# Patient Record
Sex: Female | Born: 1935 | Race: White | Hispanic: No | Smoking: Former smoker
Health system: Southern US, Community
[De-identification: ages and names within clinical notes are randomized; demographics above are authoritative.]

## PROBLEM LIST (undated history)

## (undated) DIAGNOSIS — D649 Anemia, unspecified: Secondary | ICD-10-CM

## (undated) DIAGNOSIS — I6529 Occlusion and stenosis of unspecified carotid artery: Secondary | ICD-10-CM

## (undated) DIAGNOSIS — I10 Essential (primary) hypertension: Secondary | ICD-10-CM

## (undated) DIAGNOSIS — S72009A Fracture of unspecified part of neck of unspecified femur, initial encounter for closed fracture: Secondary | ICD-10-CM

## (undated) DIAGNOSIS — E119 Type 2 diabetes mellitus without complications: Secondary | ICD-10-CM

## (undated) DIAGNOSIS — Z9289 Personal history of other medical treatment: Secondary | ICD-10-CM

## (undated) DIAGNOSIS — K219 Gastro-esophageal reflux disease without esophagitis: Secondary | ICD-10-CM

## (undated) DIAGNOSIS — E785 Hyperlipidemia, unspecified: Secondary | ICD-10-CM

## (undated) DIAGNOSIS — I739 Peripheral vascular disease, unspecified: Secondary | ICD-10-CM

## (undated) DIAGNOSIS — J449 Chronic obstructive pulmonary disease, unspecified: Secondary | ICD-10-CM

## (undated) DIAGNOSIS — I509 Heart failure, unspecified: Secondary | ICD-10-CM

## (undated) DIAGNOSIS — M1990 Unspecified osteoarthritis, unspecified site: Secondary | ICD-10-CM

## (undated) HISTORY — DX: Essential (primary) hypertension: I10

## (undated) HISTORY — PX: COLONOSCOPY: SHX174

## (undated) HISTORY — DX: Occlusion and stenosis of unspecified carotid artery: I65.29

## (undated) HISTORY — DX: Heart failure, unspecified: I50.9

## (undated) HISTORY — DX: Hyperlipidemia, unspecified: E78.5

## (undated) HISTORY — DX: Peripheral vascular disease, unspecified: I73.9

## (undated) HISTORY — DX: Anemia, unspecified: D64.9

## (undated) HISTORY — DX: Gastro-esophageal reflux disease without esophagitis: K21.9

## (undated) HISTORY — DX: Chronic obstructive pulmonary disease, unspecified: J44.9

## (undated) HISTORY — DX: Fracture of unspecified part of neck of unspecified femur, initial encounter for closed fracture: S72.009A

---
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
## 1987-07-29 HISTORY — PX: POSTERIOR LAMINECTOMY / DECOMPRESSION LUMBAR SPINE: SUR740

## 2000-07-28 HISTORY — PX: CATARACT EXTRACTION W/ INTRAOCULAR LENS  IMPLANT, BILATERAL: SHX1307

## 2001-07-28 HISTORY — PX: PERIPHERAL ARTERIAL STENT GRAFT: SHX2220

## 2002-08-03 ENCOUNTER — Encounter: Payer: Self-pay | Admitting: Cardiology

## 2002-08-03 ENCOUNTER — Ambulatory Visit (HOSPITAL_COMMUNITY): Admission: RE | Admit: 2002-08-03 | Discharge: 2002-08-04 | Payer: Self-pay | Admitting: Cardiology

## 2007-07-29 HISTORY — PX: REFRACTIVE SURGERY: SHX103

## 2007-07-29 HISTORY — PX: EYE SURGERY: SHX253

## 2009-07-04 ENCOUNTER — Ambulatory Visit: Payer: Self-pay | Admitting: Vascular Surgery

## 2010-02-19 ENCOUNTER — Ambulatory Visit: Payer: Self-pay | Admitting: Vascular Surgery

## 2010-08-21 ENCOUNTER — Ambulatory Visit: Admit: 2010-08-21 | Payer: Self-pay | Admitting: Vascular Surgery

## 2010-09-18 ENCOUNTER — Other Ambulatory Visit (INDEPENDENT_AMBULATORY_CARE_PROVIDER_SITE_OTHER): Payer: Medicare Other

## 2010-09-18 ENCOUNTER — Encounter (INDEPENDENT_AMBULATORY_CARE_PROVIDER_SITE_OTHER): Payer: Medicare Other

## 2010-09-18 DIAGNOSIS — Z48812 Encounter for surgical aftercare following surgery on the circulatory system: Secondary | ICD-10-CM

## 2010-09-18 DIAGNOSIS — I6529 Occlusion and stenosis of unspecified carotid artery: Secondary | ICD-10-CM

## 2010-09-18 DIAGNOSIS — I739 Peripheral vascular disease, unspecified: Secondary | ICD-10-CM

## 2010-09-20 NOTE — Procedures (Unsigned)
CAROTID DUPLEX EXAM  INDICATION:  Carotid stenosis.  HISTORY: Diabetes:  Yes. Cardiac:  No. Hypertension:  Yes. Smoking:  Previous. Previous Surgery:  No. CV History: Amaurosis Fugax No, Paresthesias No, Hemiparesis No.                                      RIGHT             LEFT Brachial systolic pressure:         187               195 Brachial Doppler waveforms:         WNL               WNL Vertebral direction of flow:        Antegrade         Antegrade DUPLEX VELOCITIES (cm/sec) CCA peak systolic                   99                77 ECA peak systolic                   134               123 ICA peak systolic                   262               62 ICA end diastolic                   60                14 PLAQUE MORPHOLOGY:                  Calcified         Calcified PLAQUE AMOUNT:                      Moderate          Minimal PLAQUE LOCATION:                    CCA, ICA, ECA     CCA, ICA  IMPRESSION: 1. Right internal carotid artery stenosis of 40% to 59% (high end of     range). 2. Left internal carotid artery shows no evidence of hemodynamically     significant stenosis. 3. No significant change of velocity since the previous study. 4. Vessel tortuosity is noted in the mid to distal segments of the     bilateral internal carotid arteries.  ___________________________________________ Janetta Hora Darrick Penna, MD  SH/MEDQ  D:  09/18/2010  T:  09/18/2010  Job:  [id number]

## 2010-12-10 NOTE — Procedures (Signed)
CAROTID DUPLEX EXAM   INDICATION:  Right carotid bruit.   HISTORY:  Diabetes:  Yes.  Cardiac:  No.  Hypertension:  Yes.  Smoking:  Quit.  Previous Surgery:  No.  CV History:  No.  Amaurosis Fugax No, Paresthesias No, Hemiparesis No                                       RIGHT             LEFT  Brachial systolic pressure:         146  Brachial Doppler waveforms:  Vertebral direction of flow:        Antegrade         Antegrade  DUPLEX VELOCITIES (cm/sec)  CCA peak systolic                   111               83  ECA peak systolic                   181               88  ICA peak systolic                   237               80  ICA end diastolic                   62                25  PLAQUE MORPHOLOGY:                  Calcified         Calcified  PLAQUE AMOUNT:                      Moderate to severe                  Mild  PLAQUE LOCATION:                    ICA and ECA       ICA   IMPRESSION:  1. 60-79% stenosis noted in the right internal carotid artery.  2. 20-39% stenosis noted in the left internal carotid artery.  3. Antegrade bilateral vertebral arteries.   ___________________________________________  Janetta Hora Fields, MD   MG/MEDQ  D:  07/04/2009  T:  07/04/2009  Job:  [id number]

## 2010-12-10 NOTE — Procedures (Signed)
CAROTID DUPLEX EXAM   INDICATION:  Follow up known carotid disease.   HISTORY:  Diabetes:  Yes.  Cardiac:  No.  Hypertension:  Yes.  Smoking:  Previous.  Previous Surgery:  No.  CV History:  Asymptomatic.  Amaurosis Fugax No, Paresthesias No, Hemiparesis No.                                       RIGHT             LEFT  Brachial systolic pressure:         192               188  Brachial Doppler waveforms:         Normal            Normal  Vertebral direction of flow:        Antegrade         Antegrade  DUPLEX VELOCITIES (cm/sec)  CCA peak systolic                   107               86  ECA peak systolic                   144               120  ICA peak systolic                   240               72  ICA end diastolic                   64                20  PLAQUE MORPHOLOGY:                  Calcific          Calcific  PLAQUE AMOUNT:                      Moderate-to-severe                  Mild  PLAQUE LOCATION:                    ICA, ECA          ICA   IMPRESSION:  1. Doppler velocities suggest 60% to 79% stenosis in the right      internal carotid artery.  2. Doppler velocities suggest 1% to 39% stenosis in the left internal      carotid artery.  3. Antegrade flow noted in the bilateral vertebral arteries.  4. No significant changes from previous examinations.   ___________________________________________  Janetta Hora Fields, MD   NT/MEDQ  D:  02/19/2010  T:  02/19/2010  Job:  [id number]

## 2010-12-10 NOTE — Assessment & Plan Note (Signed)
OFFICE VISIT   Cynthia Ray  DOB:  [birth year]                                       07/04/2009  PJKDT#:[id number]   CHIEF COMPLAINT:  Bilateral leg pain.   HISTORY OF PRESENT ILLNESS:  The patient is an 77 year old female who  complains of primarily right leg fatigue after walking approximately 1-  1/2 blocks.  She also developed some fatigue in the left leg, but this  is not as bad as the right.  She also complains of some pain in her left  groin when she first gets up in the morning everyday.  Pain in the groin  has been going on for approximately 3 months.   She has multiple chronic medical problems including hypertension,  elevated cholesterol and diabetes.  These are all currently stable and  are followed by Dr. Linna Darner.   Her previous vascular history includes a left superficial femoral artery  stent by Dr. Geralynn Rile in 2004.  Her ABIs at that time were 0.7 on the  right and 0.5 on the left prior to the procedure.  Multiple records from  Dr. Beatriz Stallion office were reviewed.  Arteriogram from Dr. Melinda Crutch  previous stent procedure was also reviewed today.  A CT angiogram from  [facility] dated June 07, 2009 was also reviewed today.   The patient denies any rest pain.  She denies any history of nonhealing  ulcers or wounds on her feet.   PAST MEDICAL HISTORY:  Is otherwise fairly unremarkable.   PAST SURGICAL HISTORY:  She had a tonsillectomy in 1946, back surgery in  1989, cataracts in 2002, laser surgery on both eyes in 2009.   FAMILY HISTORY:  Unremarkable.   SOCIAL HISTORY:  She is widowed and has one child.  She is a former  smoker, but quit in 1989.   REVIEW OF SYSTEMS:  Full review of systems was performed with the  patient.  Please see intake referral form for details.  A 12-point  review of systems was performed.   PHYSICAL EXAM:  Vital signs:  Blood pressure is 146/81 in the left arm,  heart rate is 82 and regular.   Temperature is 98.  HEENT:  Unremarkable.  Neck:  Has 2+ carotid pulses with a soft right  carotid bruit.  Chest is clear to auscultation except for a few crackles  in the bases.  Heart:  Exam is regular rate rhythm without murmur.  Abdomen:  Soft, nontender, nondistended, slightly obese.  No masses.  Extremities:  She has 2+ radial and 1-2+ femoral pulses bilaterally.  She has absent popliteal and pedal pulses bilaterally.  Neurologic exam  shows symmetric upper extremity and lower extremity motor strength which  is 5/5.  Skin has no rashes.  Musculoskeletal:  She has no significant  major deformities in her knees or feet.  She has no lower extremity  edema.   I reviewed her most recent ABIs which were done around the time of her  CT scan in November which showed an ABI on the right side of 0.71 and on  the left of 0.69.   I reviewed her medications which include metformin t.i.d., Lipitor or  once a day, Metanx, lisinopril hydrochlorothiazide, labetalol and  aspirin.   ALLERGIES:  She has no known drug allergies.   CT scan of the abdomen and pelvis  performed on November 11 was reviewed  in detail.  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  She has mild to  moderate iliac occlusive disease, but not necessarily focal high-grade  stenosis.  She has occlusion of her left popliteal artery with one-  vessel runoff by the anterior tibial.  She has a diffusely diseased  right superficial femoral artery with peroneal and anterior tibial  runoff on the right.   As far as the patient'Ray legs are concerned, she does have symptoms  consistent with mild claudication in her right lower extremity.  However, she has ABIs that are fairly reasonable and she is certainly  not at risk of limb loss currently.  I discussed with her the  possibility of an intervention in her right leg to try to improve her  symptoms, but I also explained  to her that this would not be without  risk and that I believe the best option at this point is for her to  ambulate 30 minutes daily to try to improve her walking distance with  conservative measures alone.   As far as the carotid bruit is concerned, we also did perform a carotid  duplex exam on her today which shows a 60%-80% carotid stenosis on the  right side and less than 40% left internal carotid artery stenosis.  Also discussed with the patient that she should continue to take her  aspirin for stroke prevention in light of the carotid findings.  She is  currently asymptomatic and denies any symptoms of TIA, amaurosis or  stroke currently.  The best option for this is continued conservative  management and observation and we will repeat her carotid duplex exam in  six months' time.  When she returns for carotid duplex exam we will also  repeat her ABIs and continue to follow her lower extremities.  If she  develops rest pain or ulcerations on her feet that are nonhealing and  more critical ischemia we would consider intervention at that time.  We  will also consider intervention if she decides that her symptoms are  more debilitating in nature.     Janetta Hora. Fields, MD  Electronically Signed   CEF/MEDQ  D:  07/04/2009  T:  07/05/2009  Job:  [id number]   cc:   Erasmo Downer, MD

## 2010-12-13 NOTE — Discharge Summary (Signed)
NAME:  Cynthia Ray, Cynthia Ray                         ACCOUNT NO.:  [id number]   MEDICAL RECORD NO.:  [id number]                   PATIENT TYPE:  OIB   LOCATION:  5703                                 FACILITY:  [facility]   PHYSICIAN:  Salvadore Farber, M.D. [facility]         DATE OF BIRTH:  27-Oct-1925   DATE OF ADMISSION:  08/03/2002  DATE OF DISCHARGE:  08/04/2002                           DISCHARGE SUMMARY - REFERRING   BRIEF HISTORY:  The patient is a pleasant 77 year old female with history of  diabetes, hypertension, and hyperlipidemia.  She has no known history of  cardiac or cardiovascular disease.  She had been experiencing claudication  for approximately a year or more, and she was referred by Dr. Linna Darner to Dr.  Samule Ohm for further evaluation of lower extremity circulation. Her ABI on the  right was 0.67 with a great toe pressure of 62.  On the left, the ABI was  0.53 with a great toe pressure of 34.  This was consistent with bilateral  femoral-popliteal occlusive disease.  The patient was admitted to [facility] for an angiogram to further evaluate her symptoms.   PAST MEDICAL HISTORY:  As noted, significant for diabetes mellitus,  hypertension, dyslipidemia, status post back surgery, status post  tonsillectomy, status post cataract surgery of both eyes.   ALLERGIES:  No known drug allergies.   SOCIAL HISTORY:  The patient is a widow.  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  She remains fairly active.  She has a daughter who lives across the  street from her who is quite involved.  She previously smoked one pack of  cigarettes per day, but she quit 14 years ago.  She does not use alcohol.   FAMILY HISTORY:  Remarkable for both parents dying of strokes in their later  years.  A brother died of an unknown cause.  The patient had three sisters  who died of unknown causes.   HOSPITAL COURSE:  As noted, this patient was admitted to [facility]  for further evaluation of  peripheral vascular disease.  She underwent  angiography on the day of admission, 08/03/2002 performed by Dr. Samule Ohm.  The  abdominal aorta revealed diffuse mild disease distally.  The right lower  extremity was diffusely diseased.  The left lower extremity had diffuse mild  disease, although the superficial femoral artery had serial 95 and 90%  lesions in the mid section.  Please see Dr. Melinda Crutch dictated report for  full details.  An angioplasty was performed with stenting of the two lesions  in the mid superficial femoral artery resulting in no residual stenosis and  normal distal flow with no change in the single-vessel runoff to the foot.  This was performed on the left lower extremity.  It was Dr. Melinda Crutch hope  that this would improve her circulation to the superficial femoral artery  which would optimize collateral circulation to the calf and  foot and  minimize her symptoms.  It was recommended she stay on Plavix for one month.  It was also recommended that she be on a statin and ACE inhibitor.  Her  metformin was held until her creatinine could be followed on an outpatient  basis.  Arrangements were made to discharge the patient on 08/04/2002 in  stable and improved condition.   LABORATORY DATA:  BMET on January 8 revealed a BUN of 8, creatinine 0.7,  potassium 3.8, sodium 130, glucose 189, calcium 8.3.  CBC on January 8  revealed hemoglobin 11.4, hematocrit 33, WBC 6, platelets 208,000.   An EKG showed normal sinus rhythm with nonspecific changes.  There was no  old tracing for comparison.   DISCHARGE MEDICATIONS:  1. Glucotrol 5 mg b.i.d.  2. Prinizide 10/12.5 one daily.  3. Zocor 40 mg daily.  4. Plavix 75 mg daily for one month.  5. Coated aspirin 81 mg daily.  6. Metformin.  7. The patient was told to take Tylenol for pain as needed.   Glucophage was to be held for now.   DISCHARGE INSTRUCTIONS:  The patient was told not to do anything strenuous  for at least two days.   She was not to lift more than 10 pounds for one  week.  She was to be on a low-salt, low-fat, diabetic diet. She was told to  call the [facility] office if she had increased pain, swelling, or bleeding  from her groin.  She was to have a basic metabolic panel blood test at the  [facility] in [place] on the day after discharge.  She was to follow  up with Dr. Samule Ohm February 9 at 11:45 a.m. in the [place] office.  She  was to see Dr. Linna Darner as needed or as scheduled.   DISCHARGE PROBLEM LIST:  1. Peripheral vascular disease status post stenting of the left lower     extremity.  Please see details as noted above.  2. History of diabetes mellitus.  3. History of hypertension.  4. Dyslipidemia.  5. Status post back surgery.  6. Status post tonsillectomy.  7. Status post cataract surgery.  8. Remote tobacco history.   ADDENDUM:  As noted, the patient is to have a basic metabolic panel  performed at the [facility] on the day following discharge.  If her  creatinine is up more than 0.2, her Metformin is to be held until it is back  to baseline.  Her baseline appears to be 0.7.  Hopefully the repeat basic  metabolic panel will reveal a creatinine of no greater than 0.9 in which  case her Metformin could be resumed.     Delton See, P.A. [facility]                  Salvadore Farber, M.D. [facility]    DR/MEDQ  D:  08/04/2002  T:  08/04/2002  Job:  [id number]   cc:   M. Linna Darner, M.D.  [facility]  [address]  [place], [place]

## 2010-12-13 NOTE — Op Note (Signed)
NAME:  Cynthia Ray, Cynthia Ray                         ACCOUNT NO.:  [id number]   MEDICAL RECORD NO.:  [id number]                   PATIENT TYPE:  OIB   LOCATION:  2858                                 FACILITY:  [facility]   PHYSICIAN:  Salvadore Farber, M.D. [facility]         DATE OF BIRTH:  1926/02/10   DATE OF PROCEDURE:  08/03/2002  DATE OF DISCHARGE:                                 OPERATIVE REPORT   PROCEDURE:  Nonselective abdominal aortography with runoff, selective left  superficial femoral artery angiography via contralateral approach,  angioplasty of two lesions in the left superficial femoral artery, stenting  of two lesions in the superficial femoral artery, selective angiography of  the left profunda.   SURGEON:  Salvadore Farber, M.D.   INDICATIONS:  The patient is a 77 year old lady with diabetes, hypertension,  dyslipidemia, and greater than a year of exertional pain in the left calf.  She has never had any rest pain or ulceration.  She has tried an exercise  program to aid this pain but must stop after less than a quarter-of-a-mile  walking.  For her general health, she is very eager to walk further if at  all possible.  Preprocedural ABIs were remarkable for an ABI on the right of  0.67 and on the left at 0.53.  Ultrasound suggested bilateral fem-pop  disease.  She was therefore brought to the angiography suite for diagnostic  angiography with an eye to intervention on the left leg.   PROCEDURAL TECHNIQUE:  Informed consent was obtained.  Under 1% lidocaine  local anesthesia and using a Smart needle, a 5-French sheath was placed in  the right femoral artery with some difficulty.  A Wholey wire was advanced  into the abdominal aorta and pigtail positioned in the infrarenal abdominal  aorta.  Nonselective abdominal aortography with runoff to both feet was  performed by power injection.  The catheter was then replaced with a LIMA  catheter, which was used to manipulate a Glide  wire over the aortic  bifurcation into the left common iliac and subsequently into the left  profunda.  An end-hole catheter was advanced into the left external iliac  and angiography performed by hand injection.  The end-hole catheter was then  advanced into the profunda to confirm position and the angiography again  performed by hand injection.  The 5-French sheath was then removed and  replaced with a 7-French Balken sheath delivered over a Rosen wire, with its  tip positioned in the profunda.  A Wholey wire was then manipulated into the  SFA and end-hole catheter positioned in the proximal SFA.  Selective SFA  angiography was performed by hand injection.  As detailed below, this  angiography demonstrated sequential 95% and 90% focal stenoses of a small  and diffusely diseased SFA.  There was occlusion of the popliteal artery on  the left with reconstitution only of the anterior tibial, which is the sole  supply to the foot.   The Rosen wire was then positioned in the proximal SFA and end-hole catheter  removed.  A Sport wire was advanced via the Balken sheath and manipulated  into the popliteal artery.  Both of the SFA lesions were then predilated  using a 3.0 x 20.0-mm Quantum balloon at 4 atmospheres for one minute on the  distal lesion and 8 atmospheres for one minute in the proximal lesion.  The  distal lesion was then stented using a 4.0 x 20.0-mm Express at 14  atmospheres.  Angiography after stent deployment demonstrated a distal edge  dissection.  This distal edge was then covered using an overlapping 3.0 x  16.0-mm Express deployed at 14 atmospheres.  The stent balloon was then  pulled back and inflated to 18 atmospheres to better expand the region of  stent overlap. A 4.0 x 15.0-mm [place] balloon was then used to post-  dilate further the region of overlap at 14 atmospheres, expanding it to 4.1  mm.  Finally, the more proximal of the two SFA lesions was stented due to   probable dissection.  A 3.0 x 20.0-mm Express was deployed at 12 atmospheres  for 30 seconds.  Final angiograms demonstrated 0% residual stenosis in  either lesion and no residual dissection.  Runoff to the foot via the  anterior tibial remained unchanged.   DIAGNOSTIC FINDINGS:  1. Abdominal aorta:  Diffuse mild disease distally.  2. Right lower extremity:  The common iliac is diffusely diseased.  The     internal is patent.  There is a 70% focal stenosis of the common.  The     external is diffusely and mildly diseased.  The profunda is widely     patent.  The SFA is small and diffusely diseased.  There is a focal 60%     stenosis in the mid-vessel, which is its most severe stenosis.  The     anterior tibial is patent proximally but occluded at the ankle.  The PT     trunk is patent with the posterior tibial occluded at its ostium.  The     peroneal is diffusely diseased proximally and occluded in its mid-     section.  3. Left leg:  Diffuse mild disease of the common, internal, and external     iliacs.  The profunda is patent.  The superficial femoral artery has     serial 95% and 90% stenoses in its mid-section, proximal to the adductor     canal.  The popliteal is occluded in its mid-section.  There is single-     vessel runoff to the foot via reconstituted anterior tibial.   RESULTS:  Successful angioplasty and provisional stenting of the two lesions  in the mid SFA resulting in no residual stenosis and normal distal flow with  no change in the single-vessel runoff to the foot.   IMPRESSION/RECOMMENDATIONS:  Successful stenting of the left superficial  femoral artery.  It is my hope that improved inflow through the superficial  femoral artery will optimize collateral circulation to the calf and foot and  minimize her symptoms.  She will be continued on aspirin, Plavix, a statin and ACE inhibitor.  Metformin will be held for 48 hours while creatinine is  rechecked.  Salvadore Farber, M.D. [facility]    WED/MEDQ  D:  08/03/2002  T:  08/03/2002  Job:  [id number]   cc:   Stormy Card.D.

## 2011-03-24 ENCOUNTER — Other Ambulatory Visit: Payer: Medicare Other

## 2011-04-16 ENCOUNTER — Ambulatory Visit: Payer: Medicare Other | Admitting: *Deleted

## 2011-04-16 ENCOUNTER — Other Ambulatory Visit (INDEPENDENT_AMBULATORY_CARE_PROVIDER_SITE_OTHER): Payer: Medicare Other | Admitting: *Deleted

## 2011-04-16 DIAGNOSIS — I6529 Occlusion and stenosis of unspecified carotid artery: Secondary | ICD-10-CM

## 2011-04-25 NOTE — Procedures (Unsigned)
CAROTID DUPLEX EXAM  INDICATION:  Carotid artery disease.  HISTORY: Diabetes:  Yes. Cardiac:  No. Hypertension:  Yes. Smoking:  Previous. Previous Surgery:  No. CV History:  Asymptomatic. Amaurosis Fugax No, Paresthesias No, Hemiparesis No.                                      RIGHT             LEFT Brachial systolic pressure:         183               192 Brachial Doppler waveforms:         Normal            Normal Vertebral direction of flow:        Antegrade         Antegrade DUPLEX VELOCITIES (cm/sec) CCA peak systolic                   53                61 ECA peak systolic                   84                78 ICA peak systolic                   209               50 ICA end diastolic                   54                18 PLAQUE MORPHOLOGY:                  Calcific          Calcific PLAQUE AMOUNT:                      Moderate          Minimal PLAQUE LOCATION:                    CCA, ICA, ECA     CCA, ICA  IMPRESSION: 1. Right internal carotid artery velocities suggest 40% to 59%     stenosis. 2. Left internal carotid artery velocities show no hemodynamically     significant stenosis. 3. No significant change in comparison to the previous examination.  ___________________________________________ Janetta Hora Fields, MD  EM/MEDQ  D:  04/16/2011  T:  04/16/2011  Job:  [id number]

## 2011-08-05 DIAGNOSIS — L97509 Non-pressure chronic ulcer of other part of unspecified foot with unspecified severity: Secondary | ICD-10-CM | POA: Diagnosis not present

## 2011-08-14 DIAGNOSIS — I10 Essential (primary) hypertension: Secondary | ICD-10-CM | POA: Diagnosis not present

## 2011-08-14 DIAGNOSIS — L84 Corns and callosities: Secondary | ICD-10-CM | POA: Diagnosis not present

## 2011-08-14 DIAGNOSIS — E782 Mixed hyperlipidemia: Secondary | ICD-10-CM | POA: Diagnosis not present

## 2011-08-14 DIAGNOSIS — IMO0001 Reserved for inherently not codable concepts without codable children: Secondary | ICD-10-CM | POA: Diagnosis not present

## 2011-09-02 DIAGNOSIS — E119 Type 2 diabetes mellitus without complications: Secondary | ICD-10-CM | POA: Diagnosis not present

## 2011-09-02 DIAGNOSIS — L97509 Non-pressure chronic ulcer of other part of unspecified foot with unspecified severity: Secondary | ICD-10-CM | POA: Diagnosis not present

## 2011-09-16 DIAGNOSIS — L600 Ingrowing nail: Secondary | ICD-10-CM | POA: Diagnosis not present

## 2011-09-23 DIAGNOSIS — L97409 Non-pressure chronic ulcer of unspecified heel and midfoot with unspecified severity: Secondary | ICD-10-CM | POA: Diagnosis not present

## 2011-10-07 DIAGNOSIS — L97509 Non-pressure chronic ulcer of other part of unspecified foot with unspecified severity: Secondary | ICD-10-CM | POA: Diagnosis not present

## 2011-10-07 DIAGNOSIS — E119 Type 2 diabetes mellitus without complications: Secondary | ICD-10-CM | POA: Diagnosis not present

## 2011-10-09 ENCOUNTER — Encounter: Payer: Self-pay | Admitting: Vascular Surgery

## 2011-10-13 DIAGNOSIS — H353190 Nonexudative age-related macular degeneration, unspecified eye, stage unspecified: Secondary | ICD-10-CM | POA: Diagnosis not present

## 2011-10-13 DIAGNOSIS — E1139 Type 2 diabetes mellitus with other diabetic ophthalmic complication: Secondary | ICD-10-CM | POA: Diagnosis not present

## 2011-10-13 DIAGNOSIS — H35359 Cystoid macular degeneration, unspecified eye: Secondary | ICD-10-CM | POA: Diagnosis not present

## 2011-10-13 DIAGNOSIS — E11339 Type 2 diabetes mellitus with moderate nonproliferative diabetic retinopathy without macular edema: Secondary | ICD-10-CM | POA: Diagnosis not present

## 2011-10-15 ENCOUNTER — Other Ambulatory Visit: Payer: Self-pay | Admitting: *Deleted

## 2011-10-15 ENCOUNTER — Encounter: Payer: Self-pay | Admitting: Vascular Surgery

## 2011-10-15 DIAGNOSIS — I70219 Atherosclerosis of native arteries of extremities with intermittent claudication, unspecified extremity: Secondary | ICD-10-CM

## 2011-10-15 DIAGNOSIS — I6529 Occlusion and stenosis of unspecified carotid artery: Secondary | ICD-10-CM

## 2011-10-16 ENCOUNTER — Ambulatory Visit (INDEPENDENT_AMBULATORY_CARE_PROVIDER_SITE_OTHER): Payer: Medicare Other | Admitting: Vascular Surgery

## 2011-10-16 ENCOUNTER — Other Ambulatory Visit: Payer: Medicare Other | Admitting: *Deleted

## 2011-10-16 ENCOUNTER — Encounter (INDEPENDENT_AMBULATORY_CARE_PROVIDER_SITE_OTHER): Payer: Medicare Other | Admitting: *Deleted

## 2011-10-16 ENCOUNTER — Encounter: Payer: Self-pay | Admitting: Vascular Surgery

## 2011-10-16 VITALS — BP 136/54 | HR 91 | Resp 18 | Ht 62.0 in | Wt 134.0 lb

## 2011-10-16 DIAGNOSIS — I739 Peripheral vascular disease, unspecified: Secondary | ICD-10-CM | POA: Diagnosis not present

## 2011-10-16 DIAGNOSIS — I70219 Atherosclerosis of native arteries of extremities with intermittent claudication, unspecified extremity: Secondary | ICD-10-CM | POA: Diagnosis not present

## 2011-10-16 DIAGNOSIS — L98499 Non-pressure chronic ulcer of skin of other sites with unspecified severity: Secondary | ICD-10-CM | POA: Diagnosis not present

## 2011-10-16 DIAGNOSIS — I6529 Occlusion and stenosis of unspecified carotid artery: Secondary | ICD-10-CM | POA: Diagnosis not present

## 2011-10-16 DIAGNOSIS — I70209 Unspecified atherosclerosis of native arteries of extremities, unspecified extremity: Secondary | ICD-10-CM | POA: Insufficient documentation

## 2011-10-16 DIAGNOSIS — Z48812 Encounter for surgical aftercare following surgery on the circulatory system: Secondary | ICD-10-CM

## 2011-10-16 NOTE — Progress Notes (Signed)
[facility] [place] HISTORY AND PHYSICAL   History of Present Illness:  Patient is a 78 y.o. year old female who presents for evaluation of a non healing wound on her left first toe.  The toe had a cyst which was excised and did not heal.  This was in November 2012.  She has no other prior episodes.  Other medical problems include a history of PAD with prior left superficial femoral artery stenting by Dr. Therisa Doyne in 2004. She also has history of hyperlipidemia hypertension diabetes and a known carotid stenosis. These are all currently stable.  Past Medical History  Diagnosis Date  . Hyperlipidemia   . Hypertension   . Diabetes mellitus   . Carotid artery occlusion     Past Surgical History  Procedure Date  . Spine surgery 1989  . Eye surgery 2002    Cataract  . Eye surgery 2009    Laser bilateral   . Tonsillectomy 1946     Social History History  Substance Use Topics  . Smoking status: Former Smoker -- 1.0 packs/day for 24 years    Types: Cigarettes    Quit date: 07/29/1987  . Smokeless tobacco: Never Used  . Alcohol Use: No    Family History No family history on file.  Allergies  No Known Allergies   Current Outpatient Prescriptions  Medication Sig Dispense Refill  . aspirin 81 MG tablet Take 81 mg by mouth daily.      Marland Kitchen atorvastatin (LIPITOR) 80 MG tablet Take 80 mg by mouth daily.      Marland Kitchen L-Methylfolate-B6-B12 (METANX PO) Take by mouth.      . LABETALOL HCL PO Take by mouth.      Marland Kitchen lisinopril-hydrochlorothiazide (PRINZIDE,ZESTORETIC) 10-12.5 MG per tablet Take 1 tablet by mouth daily.      . metFORMIN (GLUCOPHAGE) 1000 MG tablet Take 500 mg by mouth 3 (three) times daily with meals.        ROS:   General:  No weight loss, Fever, chills  HEENT: No recent headaches, no nasal bleeding, no visual changes, no sore throat  Neurologic: No dizziness, blackouts, seizures. No recent symptoms of stroke or mini- stroke. No recent episodes of  slurred speech, or temporary blindness.  Cardiac: No recent episodes of chest pain/pressure, no shortness of breath at rest.  No shortness of breath with exertion.  Denies history of atrial fibrillation or irregular heartbeat  Vascular: No history of rest pain in feet.  No history of claudication.  No history of non-healing ulcer, No history of DVT   Pulmonary: No home oxygen, no productive cough, no hemoptysis,  No asthma or wheezing  Musculoskeletal:  [ ]  Arthritis, [ ]  Low back pain,  [ ]  Joint pain  Hematologic:No history of hypercoagulable state.  No history of easy bleeding.  No history of anemia  Gastrointestinal: No hematochezia or melena,  No gastroesophageal reflux, no trouble swallowing  Urinary: [ ]  chronic Kidney disease, [ ]  on HD - [ ]  MWF or [ ]  TTHS, [ ]  Burning with urination, [ ]  Frequent urination, [ ]  Difficulty urinating;   Skin: No rashes  Psychological: No history of anxiety,  No history of depression   Physical Examination  Filed Vitals:   10/16/11 1218  BP: 136/54  Pulse: 91  Resp: 18  Height: 5\' 2"  (1.575 m)  Weight: 134 lb (60.782 kg)    Body mass index is 24.51 kg/(m^2).  General:  Alert and oriented, no acute  distress HEENT: Normal Neck: soft right bruit no JVD Pulmonary: Clear to auscultation bilaterally Cardiac: Regular Rate and Rhythm without murmur Abdomen: Soft, non-tender, non-distended, no mass, no scars Skin: No rash, 2 cm necrotic ulcer right first toe medially Extremity Pulses:  2+ radial, brachial,1+ right femoral, absent to faint right femoral vessel feels calcified, absent dorsalis pedis, posterior tibial pulses bilaterally Musculoskeletal: No deformity or edema  Neurologic: Upper and lower extremity motor 5/5 and symmetric  DATA: The patient had bilateral ABIs today which are reviewed and interpreted. ABI on the left was 0.79 right was 0.57 monophasic waveforms bilaterally   ASSESSMENT: Nonhealing wound left foot with  evidence of peripheral arterial disease. She has a threatened limb.   PLAN: The patient is scheduled for aortogram bilateral lower extremity runoff possible intervention on Friday, April 5 risks benefits possible complications and procedure details were trying to the patient today including possibility she may require bypass to repair her right leg rather than stenting    [name], MD [facility] [place] Office: [phone] Pager: [phone]

## 2011-10-17 ENCOUNTER — Other Ambulatory Visit: Payer: Self-pay | Admitting: *Deleted

## 2011-10-17 ENCOUNTER — Encounter (HOSPITAL_COMMUNITY): Payer: Self-pay | Admitting: Pharmacy Technician

## 2011-10-31 ENCOUNTER — Other Ambulatory Visit: Payer: Self-pay

## 2011-10-31 ENCOUNTER — Ambulatory Visit (HOSPITAL_COMMUNITY)
Admission: RE | Admit: 2011-10-31 | Discharge: 2011-10-31 | Disposition: A | Discharge status: Home / Self Care | Payer: Medicare Other | Source: Ambulatory Visit | Attending: Vascular Surgery | Admitting: Vascular Surgery

## 2011-10-31 ENCOUNTER — Encounter (HOSPITAL_COMMUNITY)
Admission: RE | Disposition: A | Discharge status: Home / Self Care | Payer: Self-pay | Source: Ambulatory Visit | Attending: Vascular Surgery

## 2011-10-31 ENCOUNTER — Other Ambulatory Visit: Payer: Self-pay | Admitting: *Deleted

## 2011-10-31 DIAGNOSIS — I739 Peripheral vascular disease, unspecified: Secondary | ICD-10-CM | POA: Diagnosis not present

## 2011-10-31 DIAGNOSIS — L98499 Non-pressure chronic ulcer of skin of other sites with unspecified severity: Secondary | ICD-10-CM | POA: Diagnosis not present

## 2011-10-31 DIAGNOSIS — I70299 Other atherosclerosis of native arteries of extremities, unspecified extremity: Secondary | ICD-10-CM | POA: Diagnosis not present

## 2011-10-31 DIAGNOSIS — Z01810 Encounter for preprocedural cardiovascular examination: Secondary | ICD-10-CM

## 2011-10-31 DIAGNOSIS — E785 Hyperlipidemia, unspecified: Secondary | ICD-10-CM | POA: Diagnosis not present

## 2011-10-31 DIAGNOSIS — D649 Anemia, unspecified: Secondary | ICD-10-CM

## 2011-10-31 DIAGNOSIS — I6529 Occlusion and stenosis of unspecified carotid artery: Secondary | ICD-10-CM | POA: Diagnosis not present

## 2011-10-31 DIAGNOSIS — E119 Type 2 diabetes mellitus without complications: Secondary | ICD-10-CM | POA: Diagnosis not present

## 2011-10-31 DIAGNOSIS — I10 Essential (primary) hypertension: Secondary | ICD-10-CM | POA: Diagnosis not present

## 2011-10-31 HISTORY — PX: ABDOMINAL AORTAGRAM: SHX5454

## 2011-10-31 LAB — POCT I-STAT, CHEM 8
BUN: 17 mg/dL (ref 6–23)
Calcium, Ion: 1.18 mmol/L (ref 1.12–1.32)
Chloride: 102 meq/L (ref 96–112)
Creatinine, Ser: 0.9 mg/dL (ref 0.50–1.10)
Glucose, Bld: 176 mg/dL — ABNORMAL HIGH (ref 70–99)
HCT: 23 % — ABNORMAL LOW (ref 36.0–46.0)
Hemoglobin: 7.8 g/dL — ABNORMAL LOW (ref 12.0–15.0)
Potassium: 4.5 meq/L (ref 3.5–5.1)
Sodium: 137 meq/L (ref 135–145)
TCO2: 23 mmol/L (ref 0–100)

## 2011-10-31 LAB — GLUCOSE, CAPILLARY: Glucose-Capillary: 164 mg/dL — ABNORMAL HIGH (ref 70–99)

## 2011-10-31 SURGERY — ABDOMINAL AORTAGRAM
Anesthesia: LOCAL

## 2011-10-31 MED ORDER — LABETALOL HCL 5 MG/ML IV SOLN: 10.0000 mg | INTRAVENOUS | Status: DC | PRN

## 2011-10-31 MED ORDER — LIDOCAINE HCL (PF) 1 % IJ SOLN
INTRAMUSCULAR | Status: AC
  Filled 2011-10-31: qty 30

## 2011-10-31 MED ORDER — HEPARIN (PORCINE) IN NACL 2-0.9 UNIT/ML-% IJ SOLN
INTRAMUSCULAR | Status: AC
  Filled 2011-10-31: qty 1000

## 2011-10-31 MED ORDER — ONDANSETRON HCL 4 MG/2ML IJ SOLN: 4.0000 mg | Freq: Four times a day (QID) | INTRAMUSCULAR | Status: DC | PRN

## 2011-10-31 MED ORDER — SODIUM CHLORIDE 0.45 % IV SOLN: INTRAVENOUS | Status: DC

## 2011-10-31 MED ORDER — ACETAMINOPHEN 325 MG RE SUPP: 325.0000 mg | RECTAL | Status: DC | PRN

## 2011-10-31 MED ORDER — HYDRALAZINE HCL 20 MG/ML IJ SOLN: 10.0000 mg | INTRAMUSCULAR | Status: DC | PRN

## 2011-10-31 MED ORDER — METOPROLOL TARTRATE 1 MG/ML IV SOLN: 2.0000 mg | INTRAVENOUS | Status: DC | PRN

## 2011-10-31 MED ORDER — TRAMADOL HCL 50 MG PO TABS: 50.0000 mg | ORAL_TABLET | Freq: Four times a day (QID) | ORAL | Status: DC | PRN

## 2011-10-31 MED ORDER — ACETAMINOPHEN 325 MG PO TABS: 325.0000 mg | ORAL_TABLET | ORAL | Status: DC | PRN

## 2011-10-31 MED ORDER — SODIUM CHLORIDE 0.9 % IV SOLN: INTRAVENOUS | Status: DC

## 2011-10-31 NOTE — Interval H&P Note (Signed)
History and Physical Interval Note:  10/31/2011 8:10 AM  Cynthia Ray  has presented today for surgery, with the diagnosis of pvd  The various methods of treatment have been discussed with the patient and family. After consideration of risks, benefits and other options for treatment, the patient has consented to  Procedure(s) (LRB): ABDOMINAL AORTAGRAM (N/A) as a surgical intervention .  The patients' history has been reviewed, patient examined, no change in status, stable for surgery.  I have reviewed the patients' chart and labs.  Questions were answered to the patient's satisfaction.     Reinhardt Licausi E

## 2011-10-31 NOTE — H&P (View-Only) (Signed)
[facility] [place] HISTORY AND PHYSICAL   History of Present Illness:  Patient is a 78 y.o. year old female who presents for evaluation of a non healing wound on her left first toe.  The toe had a cyst which was excised and did not heal.  This was in November 2012.  She has no other prior episodes.  Other medical problems include a history of PAD with prior left superficial femoral artery stenting by Dr. Therisa Doyne in 2004. She also has history of hyperlipidemia hypertension diabetes and a known carotid stenosis. These are all currently stable.  Past Medical History  Diagnosis Date  . Hyperlipidemia   . Hypertension   . Diabetes mellitus   . Carotid artery occlusion     Past Surgical History  Procedure Date  . Spine surgery 1989  . Eye surgery 2002    Cataract  . Eye surgery 2009    Laser bilateral   . Tonsillectomy 1946     Social History History  Substance Use Topics  . Smoking status: Former Smoker -- 1.0 packs/day for 24 years    Types: Cigarettes    Quit date: 07/29/1987  . Smokeless tobacco: Never Used  . Alcohol Use: No    Family History No family history on file.  Allergies  No Known Allergies   Current Outpatient Prescriptions  Medication Sig Dispense Refill  . aspirin 81 MG tablet Take 81 mg by mouth daily.      Marland Kitchen atorvastatin (LIPITOR) 80 MG tablet Take 80 mg by mouth daily.      Marland Kitchen L-Methylfolate-B6-B12 (METANX PO) Take by mouth.      . LABETALOL HCL PO Take by mouth.      Marland Kitchen lisinopril-hydrochlorothiazide (PRINZIDE,ZESTORETIC) 10-12.5 MG per tablet Take 1 tablet by mouth daily.      . metFORMIN (GLUCOPHAGE) 1000 MG tablet Take 500 mg by mouth 3 (three) times daily with meals.        ROS:   General:  No weight loss, Fever, chills  HEENT: No recent headaches, no nasal bleeding, no visual changes, no sore throat  Neurologic: No dizziness, blackouts, seizures. No recent symptoms of stroke or mini- stroke. No recent episodes of  slurred speech, or temporary blindness.  Cardiac: No recent episodes of chest pain/pressure, no shortness of breath at rest.  No shortness of breath with exertion.  Denies history of atrial fibrillation or irregular heartbeat  Vascular: No history of rest pain in feet.  No history of claudication.  No history of non-healing ulcer, No history of DVT   Pulmonary: No home oxygen, no productive cough, no hemoptysis,  No asthma or wheezing  Musculoskeletal:  [ ]  Arthritis, [ ]  Low back pain,  [ ]  Joint pain  Hematologic:No history of hypercoagulable state.  No history of easy bleeding.  No history of anemia  Gastrointestinal: No hematochezia or melena,  No gastroesophageal reflux, no trouble swallowing  Urinary: [ ]  chronic Kidney disease, [ ]  on HD - [ ]  MWF or [ ]  TTHS, [ ]  Burning with urination, [ ]  Frequent urination, [ ]  Difficulty urinating;   Skin: No rashes  Psychological: No history of anxiety,  No history of depression   Physical Examination  Filed Vitals:   10/16/11 1218  BP: 136/54  Pulse: 91  Resp: 18  Height: 5\' 2"  (1.575 m)  Weight: 134 lb (60.782 kg)    Body mass index is 24.51 kg/(m^2).  General:  Alert and oriented, no acute  distress HEENT: Normal Neck: soft right bruit no JVD Pulmonary: Clear to auscultation bilaterally Cardiac: Regular Rate and Rhythm without murmur Abdomen: Soft, non-tender, non-distended, no mass, no scars Skin: No rash, 2 cm necrotic ulcer right first toe medially Extremity Pulses:  2+ radial, brachial,1+ right femoral, absent to faint right femoral vessel feels calcified, absent dorsalis pedis, posterior tibial pulses bilaterally Musculoskeletal: No deformity or edema  Neurologic: Upper and lower extremity motor 5/5 and symmetric  DATA: The patient had bilateral ABIs today which are reviewed and interpreted. ABI on the left was 0.79 right was 0.57 monophasic waveforms bilaterally   ASSESSMENT: Nonhealing wound left foot with  evidence of peripheral arterial disease. She has a threatened limb.   PLAN: The patient is scheduled for aortogram bilateral lower extremity runoff possible intervention on Friday, April 5 risks benefits possible complications and procedure details were trying to the patient today including possibility she may require bypass to repair her right leg rather than stenting    [name], MD [facility] [place] Office: [phone] Pager: [phone]

## 2011-10-31 NOTE — Op Note (Signed)
Procedure: Aortogram with bilateral lower extremity runoff  Preoperative diagnosis: Non healing wound right foot  Postoperative diagnosis: Same  Anesthesia Local  Operative details: After obtaining informed consent, the patient was taken to the [facility] LAB. The patient was placed in supine position on the Angio table. Both groins were prepped and draped in usual sterile fashion. Local anesthesia was infiltrated over the left common femoral artery.  An introducer needle was used to cannulate the left common femoral artery and .035 versacore wire threaded into the abdominal aorta under fluoroscopic guidance. Next a 5 French sheath is placed over the guidewire in the left common femoral artery. A 5 French pigtail catheter was placed over the guidewire into the abdominal aorta and abdominal aortogram was obtained. The infrarenal abdominal aorta is patent. The left and right common internal and external iliac arteries are patent.   Next a bilateral lower extremity runoff was performed through the pigtail catheter.  In the left lower extremity, the common femoral and profunda femoris is patent.  The left distal SFA and popliteal is occluded.  The native vessels are small throughout.  The anterior tibial artery is patent proximally.  The posterior tibial artery is occluded.  The peroneal is patent.     In the right lower extremity, the common femoral and profunda femoris arteries are patent.  The right SFA is occluded.  The above knee popliteal artery is occluded.  The below knee popliteal artery is patent but small.  The posterior tibial artery is occluded. The peroneal artery is patent.  The anterior tibial artery is occluded. The peroneal fills the DP which supplies the foot.    The 5Fr sheath was left in place to be pulled in the holding area. The patient tolerated the procedure well and there were no complications. Patient was taken to the holding area in stable condition.  Operative findings: Left leg-  Distal SFA and popliteal occlusion.  Severe tibial disease with small vessels. Runoff as above patent but diseased peroneal and anterior tibial vessels.  Right leg- SFA  occlusion, patent small below knee popliteal one vessel runoff via the peroneal which reconstitutes the DP.  Severe tibial disease with very small vessels     Operative management: The patient will be scheduled for a right femoral to below knee bypass in the near future. However, she has severe anemia on labwork today and this will need evaluation as well as cardiac risk stratification prior to considering bypass  [name], MD [facility] [place] Office: [phone] Pager: [phone]

## 2011-10-31 NOTE — Discharge Instructions (Signed)
Do not take your Metformin for 48 hours.  You may restart this on 11/03/11.  You were anemic on your labwork today.  We will need your primary MD to evaluate this. We will schedule you for a Cardiology evaluation and if this is ok we will schedule you for a bypass in your right leg to get more blood to the foot to heal it.  Groin Site Care Refer to this sheet in the next few weeks. These instructions provide you with information on caring for yourself after your procedure. Your caregiver may also give you more specific instructions. Your treatment has been planned according to current medical practices, but problems sometimes occur. Call your caregiver if you have any problems or questions after your procedure. HOME CARE INSTRUCTIONS  You may shower 24 hours after the procedure. Remove the bandage (dressing) and gently wash the site with plain soap and water. Gently pat the site dry.   Do not apply powder or lotion to the site.   Do not sit in a bathtub, swimming pool, or whirlpool for 5 to 7 days.   No bending, squatting, or lifting anything over 10 pounds (4.5 kg) as directed by your caregiver.   Inspect the site at least twice daily.   Do not drive home if you are discharged the same day of the procedure. Have someone else drive you.   You may drive 24 hours after the procedure unless otherwise instructed by your caregiver.  What to expect:  Any bruising will usually fade within 1 to 2 weeks.   Blood that collects in the tissue (hematoma) may be painful to the touch. It should usually decrease in size and tenderness within 1 to 2 weeks.  SEEK IMMEDIATE MEDICAL CARE IF:  You have unusual pain at the groin site or down the affected leg.   You have redness, warmth, swelling, or pain at the groin site.   You have drainage (other than a small amount of blood on the dressing).   You have chills.   You have a fever or persistent symptoms for more than 72 hours.   You have a fever and  your symptoms suddenly get worse.   Your leg becomes pale, cool, tingly, or numb.   You have heavy bleeding from the site. Hold pressure on the site.  Document Released: 08/16/2010 Document Revised: 07/03/2011 Document Reviewed: 08/16/2010 [facility] Patient Information 2012 [place], [place].

## 2011-11-11 ENCOUNTER — Encounter: Payer: Self-pay | Admitting: Vascular Surgery

## 2011-11-11 DIAGNOSIS — Z9289 Personal history of other medical treatment: Secondary | ICD-10-CM

## 2011-11-11 DIAGNOSIS — I10 Essential (primary) hypertension: Secondary | ICD-10-CM | POA: Diagnosis not present

## 2011-11-11 DIAGNOSIS — Z79899 Other long term (current) drug therapy: Secondary | ICD-10-CM | POA: Diagnosis not present

## 2011-11-11 DIAGNOSIS — E785 Hyperlipidemia, unspecified: Secondary | ICD-10-CM | POA: Diagnosis not present

## 2011-11-11 DIAGNOSIS — K5900 Constipation, unspecified: Secondary | ICD-10-CM | POA: Diagnosis present

## 2011-11-11 DIAGNOSIS — Z87891 Personal history of nicotine dependence: Secondary | ICD-10-CM | POA: Diagnosis not present

## 2011-11-11 DIAGNOSIS — Z8711 Personal history of peptic ulcer disease: Secondary | ICD-10-CM | POA: Diagnosis not present

## 2011-11-11 DIAGNOSIS — E119 Type 2 diabetes mellitus without complications: Secondary | ICD-10-CM | POA: Diagnosis not present

## 2011-11-11 DIAGNOSIS — Z23 Encounter for immunization: Secondary | ICD-10-CM | POA: Diagnosis not present

## 2011-11-11 DIAGNOSIS — I739 Peripheral vascular disease, unspecified: Secondary | ICD-10-CM | POA: Diagnosis not present

## 2011-11-11 DIAGNOSIS — R197 Diarrhea, unspecified: Secondary | ICD-10-CM | POA: Diagnosis present

## 2011-11-11 DIAGNOSIS — D649 Anemia, unspecified: Secondary | ICD-10-CM | POA: Diagnosis not present

## 2011-11-11 HISTORY — DX: Personal history of other medical treatment: Z92.89

## 2011-11-12 ENCOUNTER — Ambulatory Visit: Payer: Medicare Other | Admitting: Cardiology

## 2011-11-12 DIAGNOSIS — E119 Type 2 diabetes mellitus without complications: Secondary | ICD-10-CM | POA: Diagnosis not present

## 2011-11-12 DIAGNOSIS — I739 Peripheral vascular disease, unspecified: Secondary | ICD-10-CM | POA: Diagnosis not present

## 2011-11-12 DIAGNOSIS — D649 Anemia, unspecified: Secondary | ICD-10-CM | POA: Diagnosis not present

## 2011-11-12 DIAGNOSIS — I10 Essential (primary) hypertension: Secondary | ICD-10-CM | POA: Diagnosis not present

## 2011-11-12 DIAGNOSIS — E785 Hyperlipidemia, unspecified: Secondary | ICD-10-CM | POA: Diagnosis not present

## 2011-11-13 ENCOUNTER — Ambulatory Visit: Payer: Medicare Other | Admitting: Vascular Surgery

## 2011-11-13 DIAGNOSIS — I739 Peripheral vascular disease, unspecified: Secondary | ICD-10-CM | POA: Diagnosis not present

## 2011-11-13 DIAGNOSIS — E785 Hyperlipidemia, unspecified: Secondary | ICD-10-CM | POA: Diagnosis not present

## 2011-11-13 DIAGNOSIS — I10 Essential (primary) hypertension: Secondary | ICD-10-CM | POA: Diagnosis not present

## 2011-11-13 DIAGNOSIS — E119 Type 2 diabetes mellitus without complications: Secondary | ICD-10-CM | POA: Diagnosis not present

## 2011-11-13 DIAGNOSIS — Z23 Encounter for immunization: Secondary | ICD-10-CM | POA: Diagnosis not present

## 2011-11-13 DIAGNOSIS — Z87891 Personal history of nicotine dependence: Secondary | ICD-10-CM | POA: Diagnosis not present

## 2011-11-13 DIAGNOSIS — D649 Anemia, unspecified: Secondary | ICD-10-CM | POA: Diagnosis not present

## 2011-11-13 DIAGNOSIS — Z8711 Personal history of peptic ulcer disease: Secondary | ICD-10-CM | POA: Diagnosis not present

## 2011-11-27 DIAGNOSIS — D649 Anemia, unspecified: Secondary | ICD-10-CM | POA: Diagnosis not present

## 2011-11-27 DIAGNOSIS — IMO0001 Reserved for inherently not codable concepts without codable children: Secondary | ICD-10-CM | POA: Diagnosis not present

## 2011-11-27 DIAGNOSIS — E785 Hyperlipidemia, unspecified: Secondary | ICD-10-CM | POA: Diagnosis not present

## 2011-12-04 DIAGNOSIS — D509 Iron deficiency anemia, unspecified: Secondary | ICD-10-CM | POA: Diagnosis not present

## 2011-12-08 DIAGNOSIS — R197 Diarrhea, unspecified: Secondary | ICD-10-CM | POA: Diagnosis not present

## 2011-12-08 DIAGNOSIS — E119 Type 2 diabetes mellitus without complications: Secondary | ICD-10-CM | POA: Diagnosis not present

## 2011-12-08 DIAGNOSIS — Z86010 Personal history of colonic polyps: Secondary | ICD-10-CM | POA: Diagnosis not present

## 2011-12-08 DIAGNOSIS — K2970 Gastritis, unspecified, without bleeding: Secondary | ICD-10-CM | POA: Diagnosis not present

## 2011-12-08 DIAGNOSIS — D509 Iron deficiency anemia, unspecified: Secondary | ICD-10-CM | POA: Diagnosis not present

## 2011-12-08 DIAGNOSIS — K5289 Other specified noninfective gastroenteritis and colitis: Secondary | ICD-10-CM | POA: Diagnosis not present

## 2011-12-08 DIAGNOSIS — D126 Benign neoplasm of colon, unspecified: Secondary | ICD-10-CM | POA: Diagnosis not present

## 2011-12-08 DIAGNOSIS — K5730 Diverticulosis of large intestine without perforation or abscess without bleeding: Secondary | ICD-10-CM | POA: Diagnosis not present

## 2011-12-08 DIAGNOSIS — Z791 Long term (current) use of non-steroidal anti-inflammatories (NSAID): Secondary | ICD-10-CM | POA: Diagnosis not present

## 2011-12-08 DIAGNOSIS — D649 Anemia, unspecified: Secondary | ICD-10-CM | POA: Diagnosis not present

## 2011-12-08 DIAGNOSIS — I739 Peripheral vascular disease, unspecified: Secondary | ICD-10-CM | POA: Diagnosis not present

## 2011-12-08 DIAGNOSIS — R634 Abnormal weight loss: Secondary | ICD-10-CM | POA: Diagnosis not present

## 2011-12-08 DIAGNOSIS — K2990 Gastroduodenitis, unspecified, without bleeding: Secondary | ICD-10-CM | POA: Diagnosis not present

## 2011-12-08 DIAGNOSIS — K648 Other hemorrhoids: Secondary | ICD-10-CM | POA: Diagnosis not present

## 2011-12-08 DIAGNOSIS — E785 Hyperlipidemia, unspecified: Secondary | ICD-10-CM | POA: Diagnosis not present

## 2011-12-08 DIAGNOSIS — K633 Ulcer of intestine: Secondary | ICD-10-CM | POA: Diagnosis not present

## 2011-12-08 DIAGNOSIS — I10 Essential (primary) hypertension: Secondary | ICD-10-CM | POA: Diagnosis not present

## 2011-12-08 DIAGNOSIS — Z79899 Other long term (current) drug therapy: Secondary | ICD-10-CM | POA: Diagnosis not present

## 2011-12-16 ENCOUNTER — Encounter: Payer: Medicare Other | Admitting: Internal Medicine

## 2011-12-16 DIAGNOSIS — R634 Abnormal weight loss: Secondary | ICD-10-CM | POA: Diagnosis not present

## 2011-12-16 DIAGNOSIS — D509 Iron deficiency anemia, unspecified: Secondary | ICD-10-CM | POA: Diagnosis not present

## 2011-12-16 DIAGNOSIS — K5289 Other specified noninfective gastroenteritis and colitis: Secondary | ICD-10-CM

## 2011-12-16 DIAGNOSIS — N183 Chronic kidney disease, stage 3 unspecified: Secondary | ICD-10-CM | POA: Diagnosis not present

## 2011-12-16 DIAGNOSIS — D500 Iron deficiency anemia secondary to blood loss (chronic): Secondary | ICD-10-CM | POA: Diagnosis not present

## 2011-12-25 DIAGNOSIS — D509 Iron deficiency anemia, unspecified: Secondary | ICD-10-CM | POA: Diagnosis not present

## 2011-12-30 ENCOUNTER — Encounter: Payer: Self-pay | Admitting: Cardiology

## 2011-12-30 ENCOUNTER — Ambulatory Visit (INDEPENDENT_AMBULATORY_CARE_PROVIDER_SITE_OTHER): Payer: Medicare Other | Admitting: Cardiology

## 2011-12-30 ENCOUNTER — Encounter: Payer: Self-pay | Admitting: *Deleted

## 2011-12-30 ENCOUNTER — Other Ambulatory Visit: Payer: Self-pay | Admitting: Cardiology

## 2011-12-30 VITALS — BP 160/76 | HR 89 | Resp 16 | Ht 63.0 in | Wt 131.0 lb

## 2011-12-30 DIAGNOSIS — Z01810 Encounter for preprocedural cardiovascular examination: Secondary | ICD-10-CM

## 2011-12-30 DIAGNOSIS — E785 Hyperlipidemia, unspecified: Secondary | ICD-10-CM

## 2011-12-30 DIAGNOSIS — I10 Essential (primary) hypertension: Secondary | ICD-10-CM | POA: Insufficient documentation

## 2011-12-30 NOTE — Patient Instructions (Signed)
Your physician recommends that you continue on your current medications as directed. Please refer to the Current Medication list given to you today. Your physician has requested that you have a lexiscan myoview. For further information please visit [email or web address]. Please follow instruction sheet, as given. We will call you with your results. 

## 2011-12-30 NOTE — Progress Notes (Signed)
 HPI  The patient presents for preoperative evaluation prior to right below the knee bypass surgery for significant lower extremity vascular disease with a nonhealing ulcer.  She has no prior cardiac history but does have a history of stenting of her left leg in 2004. She has multiple cardiovascular risk factors. He is due to have procedure as described. Prior to this she sent for preoperative evaluation. She has also been sent for an evaluation of her anemia. She has seen a hematologist and gastroenterologist. She's never had any prior cardiac workup per her report. She denies any cardiac catheterizations or stress testing. She reports she's never had a heart attack. She has not get chest pressure, neck or arm discomfort. She does not have palpitations, presyncope or syncope. She has no shortness of breath, PND or orthopnea. However, her activities are limited to mild housework (less than 5 METs).  No Known Allergies  Current Outpatient Prescriptions  Medication Sig Dispense Refill  . atorvastatin (LIPITOR) 80 MG tablet Take 80 mg by mouth daily.      . glipiZIDE (GLUCOTROL) 5 MG tablet Take 5 mg by mouth 2 (two) times daily before a meal.      . IRON PO Take by mouth 2 (two) times daily.      . L-Methylfolate-B6-B12 (METANX PO) Take 1 tablet by mouth daily.       . LABETALOL HCL PO Take 300 mg by mouth 2 (two) times daily.       . lisinopril-hydrochlorothiazide (PRINZIDE,ZESTORETIC) 10-12.5 MG per tablet Take 1 tablet by mouth daily.      . metFORMIN (GLUCOPHAGE) 1000 MG tablet Take 500 mg by mouth 3 (three) times daily with meals.      . Multiple Vitamins-Minerals (MULTI COMPLETE PO) Take by mouth.        Past Medical History  Diagnosis Date  . Hyperlipidemia   . Hypertension   . Diabetes mellitus   . Carotid artery occlusion     Past Surgical History  Procedure Date  . Spine surgery 1989  . Eye surgery 2002    Cataract  . Eye surgery 2009    Laser bilateral   . Tonsillectomy  1946    No family history on file.  History   Social History  . Marital Status: Widowed    Spouse Name: N/A    Number of Children: N/A  . Years of Education: N/A   Occupational History  . Not on file.   Social History Main Topics  . Smoking status: Former Smoker -- 1.0 packs/day for 24 years    Types: Cigarettes    Quit date: 07/29/1987  . Smokeless tobacco: Never Used  . Alcohol Use: No  . Drug Use: No  . Sexually Active: Not on file   Other Topics Concern  . Not on file   Social History Narrative  . No narrative on file    ROS:  As stated in the HPI and negative for all other systems.  PHYSICAL EXAM BP 160/76  Pulse 89  Resp 16  Ht 5' 3" (1.6 m)  Wt 131 lb (59.421 kg)  BMI 23.21 kg/m2 GENERAL:  Well appearing HEENT:  Pupils equal round and reactive, fundi not visualized, oral mucosa unremarkable NECK:  No jugular venous distention, waveform within normal limits, carotid upstroke brisk and symmetric, no bruits, no thyromegaly LYMPHATICS:  No cervical, inguinal adenopathy LUNGS:  Clear to auscultation bilaterally BACK:  No CVA tenderness CHEST:  Unremarkable HEART:  PMI not displaced   or sustained,S1 and S2 within normal limits, no S3, no S4, no clicks, no rubs, soft apical early peaking systolic murmur radiating out the aortic outflow tract ABD:  Flat, positive bowel sounds normal in frequency in pitch, no bruits, no rebound, no guarding, no midline pulsatile mass, no hepatomegaly, no splenomegaly EXT:  2 plus pulses upper absent dorsalis pedis and posterior tibialis bilateral, no edema, no cyanosis no clubbing SKIN:  No rashes no nodules NEURO:  Cranial nerves II through XII grossly intact, motor grossly intact throughout PSYCH:  Cognitively intact, oriented to person place and time  EKG:  Normal sinus rhythm, rate 89, low voltage in chest leads, possible old anteroseptal microinfarction. 10/31/11  ASSESSMENT AND PLAN    

## 2011-12-30 NOTE — Assessment & Plan Note (Signed)
I will defer to her primary provider with a goal LDL less than 100 and HDL greater than 40.

## 2011-12-30 NOTE — Assessment & Plan Note (Signed)
Her blood pressures not currently controlled it was 113 systolic at the last reading. Therefore, I will not make a change her regimen. We will need to keep a blood pressure diary.

## 2011-12-30 NOTE — Assessment & Plan Note (Signed)
The patient has limited functional status and significant cardiovascular risk factors. She would need stress testing prior to vascular surgery. However, she would not be a little walk on a treadmill so she will get a [employer].

## 2012-01-01 DIAGNOSIS — Z01810 Encounter for preprocedural cardiovascular examination: Secondary | ICD-10-CM | POA: Diagnosis not present

## 2012-01-01 DIAGNOSIS — E785 Hyperlipidemia, unspecified: Secondary | ICD-10-CM | POA: Diagnosis not present

## 2012-01-01 DIAGNOSIS — I10 Essential (primary) hypertension: Secondary | ICD-10-CM | POA: Diagnosis not present

## 2012-01-01 DIAGNOSIS — R079 Chest pain, unspecified: Secondary | ICD-10-CM | POA: Diagnosis not present

## 2012-01-09 ENCOUNTER — Telehealth: Payer: Self-pay | Admitting: *Deleted

## 2012-01-09 DIAGNOSIS — R9439 Abnormal result of other cardiovascular function study: Secondary | ICD-10-CM

## 2012-01-09 DIAGNOSIS — Z01810 Encounter for preprocedural cardiovascular examination: Secondary | ICD-10-CM

## 2012-01-09 DIAGNOSIS — R0602 Shortness of breath: Secondary | ICD-10-CM

## 2012-01-09 DIAGNOSIS — I10 Essential (primary) hypertension: Secondary | ICD-10-CM

## 2012-01-09 NOTE — Telephone Encounter (Signed)
Message copied by Eustace Moore on Fri Jan 09, 2012 10:03 AM ------      Message from: Rollene Rotunda      Created: Fri Jan 09, 2012  9:03 AM       This patient will need a cath before I can clear her for surgery.  I tried to call her and her daughter but I got no answer.  Call Ms. Michaelis with the results and send results to [facility] A, MD.

## 2012-01-09 NOTE — Telephone Encounter (Addendum)
Patient informed that nurse will be calling her next week to give all information r/e heart cath. Spoke with Hochrein to confirm details for heart cath. Left heart cath on medications in [facility] lab with Hochrein.

## 2012-01-12 ENCOUNTER — Encounter: Payer: Self-pay | Admitting: *Deleted

## 2012-01-12 ENCOUNTER — Telehealth: Payer: Self-pay

## 2012-01-12 DIAGNOSIS — I10 Essential (primary) hypertension: Secondary | ICD-10-CM | POA: Diagnosis not present

## 2012-01-12 DIAGNOSIS — Z01811 Encounter for preprocedural respiratory examination: Secondary | ICD-10-CM | POA: Diagnosis not present

## 2012-01-12 DIAGNOSIS — Z01810 Encounter for preprocedural cardiovascular examination: Secondary | ICD-10-CM | POA: Diagnosis not present

## 2012-01-12 DIAGNOSIS — R0602 Shortness of breath: Secondary | ICD-10-CM | POA: Diagnosis not present

## 2012-01-12 LAB — PROTIME-INR

## 2012-01-12 NOTE — Telephone Encounter (Signed)
Left Heart Cath with Dr. Antoine Poche on 01/14/12 @11 :30 am  CHECKING PERCERT

## 2012-01-12 NOTE — Telephone Encounter (Signed)
Left heart cath scheduled for Wednesday 01/14/12 @11 :30 am (code [id number]) Patient informed to come to office today to get instructions and lab/cxr orders. Sent message to check precert status.

## 2012-01-12 NOTE — Telephone Encounter (Signed)
No precert required.  Medicare and Medicare supplement plan F

## 2012-01-14 ENCOUNTER — Inpatient Hospital Stay (HOSPITAL_BASED_OUTPATIENT_CLINIC_OR_DEPARTMENT_OTHER)
Admission: RE | Admit: 2012-01-14 | Discharge: 2012-01-14 | Disposition: A | Discharge status: Home / Self Care | Payer: Medicare Other | Source: Ambulatory Visit | Attending: Cardiology | Admitting: Cardiology

## 2012-01-14 ENCOUNTER — Encounter (HOSPITAL_BASED_OUTPATIENT_CLINIC_OR_DEPARTMENT_OTHER)
Admission: RE | Disposition: A | Discharge status: Home / Self Care | Payer: Self-pay | Source: Ambulatory Visit | Attending: Cardiology

## 2012-01-14 DIAGNOSIS — I2510 Atherosclerotic heart disease of native coronary artery without angina pectoris: Secondary | ICD-10-CM

## 2012-01-14 DIAGNOSIS — R9439 Abnormal result of other cardiovascular function study: Secondary | ICD-10-CM | POA: Insufficient documentation

## 2012-01-14 SURGERY — JV LEFT HEART CATHETERIZATION WITH CORONARY ANGIOGRAM
Anesthesia: Moderate Sedation

## 2012-01-14 MED ORDER — ONDANSETRON HCL 4 MG/2ML IJ SOLN: 4.0000 mg | Freq: Four times a day (QID) | INTRAMUSCULAR | Status: DC | PRN

## 2012-01-14 MED ORDER — ACETAMINOPHEN 325 MG PO TABS: 650.0000 mg | ORAL_TABLET | ORAL | Status: DC | PRN

## 2012-01-14 MED ORDER — SODIUM CHLORIDE 0.9 % IV SOLN: INTRAVENOUS | Status: DC

## 2012-01-14 MED ORDER — LABETALOL HCL 5 MG/ML IV SOLN
10.0000 mg | Freq: Once | INTRAVENOUS | Status: AC
  Administered 2012-01-14: 10 mg via INTRAVENOUS

## 2012-01-14 NOTE — OR Nursing (Signed)
Dr Hochrein at bedside to discuss results and treatment plan with pt and family 

## 2012-01-14 NOTE — OR Nursing (Signed)
Tegaderm dressing applied, site level 0, bedrest begins at 1400 

## 2012-01-14 NOTE — Interval H&P Note (Signed)
History and Physical Interval Note:  01/14/2012 1:45 PM  Cynthia Ray  has presented today for surgery, with the diagnosis of Abnormal Stress test  She had a completely reversible mid/basilar inferior wall defect.The various methods of treatment have been discussed with the patient and family. After consideration of risks, benefits and other options for treatment, the patient has consented to  Procedure(s) (LRB): JV LEFT HEART CATHETERIZATION WITH CORONARY ANGIOGRAM (N/A) as a surgical intervention .  The patient's history has been reviewed, patient examined, no change in status, stable for surgery.  I have reviewed the patients' chart and labs.  Questions were answered to the patient's satisfaction.     Rollene Rotunda

## 2012-01-14 NOTE — H&P (View-Only) (Signed)
HPI  The patient presents for preoperative evaluation prior to right below the knee bypass surgery for significant lower extremity vascular disease with a nonhealing ulcer.  She has no prior cardiac history but does have a history of stenting of her left leg in 2004. She has multiple cardiovascular risk factors. He is due to have procedure as described. Prior to this she sent for preoperative evaluation. She has also been sent for an evaluation of her anemia. She has seen a hematologist and gastroenterologist. She's never had any prior cardiac workup per her report. She denies any cardiac catheterizations or stress testing. She reports she's never had a heart attack. She has not get chest pressure, neck or arm discomfort. She does not have palpitations, presyncope or syncope. She has no shortness of breath, PND or orthopnea. However, her activities are limited to mild housework (less than 5 METs).  No Known Allergies  Current Outpatient Prescriptions  Medication Sig Dispense Refill  . atorvastatin (LIPITOR) 80 MG tablet Take 80 mg by mouth daily.      Marland Kitchen glipiZIDE (GLUCOTROL) 5 MG tablet Take 5 mg by mouth 2 (two) times daily before a meal.      . IRON PO Take by mouth 2 (two) times daily.      Marland Kitchen L-Methylfolate-B6-B12 (METANX PO) Take 1 tablet by mouth daily.       Marland Kitchen LABETALOL HCL PO Take 300 mg by mouth 2 (two) times daily.       Marland Kitchen lisinopril-hydrochlorothiazide (PRINZIDE,ZESTORETIC) 10-12.5 MG per tablet Take 1 tablet by mouth daily.      . metFORMIN (GLUCOPHAGE) 1000 MG tablet Take 500 mg by mouth 3 (three) times daily with meals.      . Multiple Vitamins-Minerals (MULTI COMPLETE PO) Take by mouth.        Past Medical History  Diagnosis Date  . Hyperlipidemia   . Hypertension   . Diabetes mellitus   . Carotid artery occlusion     Past Surgical History  Procedure Date  . Spine surgery 1989  . Eye surgery 2002    Cataract  . Eye surgery 2009    Laser bilateral   . Tonsillectomy  1946    No family history on file.  History   Social History  . Marital Status: Widowed    Spouse Name: N/A    Number of Children: N/A  . Years of Education: N/A   Occupational History  . Not on file.   Social History Main Topics  . Smoking status: Former Smoker -- 1.0 packs/day for 24 years    Types: Cigarettes    Quit date: 07/29/1987  . Smokeless tobacco: Never Used  . Alcohol Use: No  . Drug Use: No  . Sexually Active: Not on file   Other Topics Concern  . Not on file   Social History Narrative  . No narrative on file    ROS:  As stated in the HPI and negative for all other systems.  PHYSICAL EXAM BP 160/76  Pulse 89  Resp 16  Ht 5\' 3"  (1.6 m)  Wt 131 lb (59.421 kg)  BMI 23.21 kg/m2 GENERAL:  Well appearing HEENT:  Pupils equal round and reactive, fundi not visualized, oral mucosa unremarkable NECK:  No jugular venous distention, waveform within normal limits, carotid upstroke brisk and symmetric, no bruits, no thyromegaly LYMPHATICS:  No cervical, inguinal adenopathy LUNGS:  Clear to auscultation bilaterally BACK:  No CVA tenderness CHEST:  Unremarkable HEART:  PMI not displaced  or sustained,S1 and S2 within normal limits, no S3, no S4, no clicks, no rubs, soft apical early peaking systolic murmur radiating out the aortic outflow tract ABD:  Flat, positive bowel sounds normal in frequency in pitch, no bruits, no rebound, no guarding, no midline pulsatile mass, no hepatomegaly, no splenomegaly EXT:  2 plus pulses upper absent dorsalis pedis and posterior tibialis bilateral, no edema, no cyanosis no clubbing SKIN:  No rashes no nodules NEURO:  Cranial nerves II through XII grossly intact, motor grossly intact throughout PSYCH:  Cognitively intact, oriented to person place and time  EKG:  Normal sinus rhythm, rate 89, low voltage in chest leads, possible old anteroseptal microinfarction. 10/31/11  ASSESSMENT AND PLAN

## 2012-01-14 NOTE — OR Nursing (Signed)
Meal served 

## 2012-01-14 NOTE — CV Procedure (Signed)
  Cardiac Catheterization Procedure Note  Name: BREYON BLASS MRN: [id number] DOB: 07-20-1926  Procedure: Left Heart Cath, Selective Coronary Angiography, LV angiography  Indication:  Abnormal stress perfusion study with inferior ischemia.  Preop for possible right lower extremity vascular surgery.  Procedural details: The right groin was prepped, draped, and anesthetized with 1% lidocaine. Using modified Seldinger technique, a 4 French sheath was introduced into the right femoral artery. Standard Judkins catheters were used for coronary angiography and left ventriculography. Catheter exchanges were performed over a guidewire. There were no immediate procedural complications. The patient was transferred to the post catheterization recovery area for further monitoring.  Procedural Findings:   Hemodynamics:     AO 179/62    LV 192/12   Coronary angiography:   Coronary dominance: Right  Left mainstem:   Moderately calcified.  Distal 60%.    Left anterior descending (LAD):   Probable ostial 60% (difficult to fully lay out the ostial circ/LAD/RI).  Ramus Intermedius:  Moderate sized.  It appears to be free of high grade disease although it is difficult to visualize the ostium.  Left circumflex (LCx):  AV groove ostial 40%.  OM1 moderate with ostial 50%.  OM2 moderate and branching without high grade disease  Right coronary artery (RCA):  Large.  Heavy calcification.  Long proximal 30%.  Focal mid 99% followed by 95%.  PDA small to moderate and normal.  PL x 2 small to moderate and normal.  Left ventriculography: Left ventricular systolic function is normal, LVEF is estimated at 65%, there is no significant mitral regurgitation   Final Conclusions:  Heavy valve calcification.  Severe single vessel CAD with moderate disease elsewhere  Recommendations: I will review with colleagues for consideration of PCI of the RCA before possible lower extremity surgery.    Rollene Rotunda 01/14/2012, 1:46 PM

## 2012-01-14 NOTE — OR Nursing (Signed)
Discharge instructions reviewed and signed, pt stated understanding, ambulated in hall without difficulty, site level 0, transported to daughter's car via wheelchair 

## 2012-01-16 ENCOUNTER — Other Ambulatory Visit: Payer: Self-pay | Admitting: *Deleted

## 2012-01-16 ENCOUNTER — Telehealth: Payer: Self-pay | Admitting: Cardiology

## 2012-01-16 ENCOUNTER — Encounter (HOSPITAL_COMMUNITY): Payer: Self-pay | Admitting: Pharmacy Technician

## 2012-01-16 ENCOUNTER — Telehealth: Payer: Self-pay | Admitting: Physician Assistant

## 2012-01-16 MED ORDER — CLOPIDOGREL BISULFATE 75 MG PO TABS: 75.0000 mg | ORAL_TABLET | Freq: Every day | ORAL | Status: DC

## 2012-01-16 NOTE — Telephone Encounter (Signed)
Patient informed. 

## 2012-01-16 NOTE — Telephone Encounter (Signed)
Patient is scheduled for PCI with Dr. [place] Monday 01/19/12 at 1pm. She was called by Daun Peacock, cath lab coordinator for instructions - she is to arrive at 11am and is not to eat or drink after 6am the same day. The patient verbalized understanding.

## 2012-01-16 NOTE — Telephone Encounter (Signed)
Message copied by Eustace Moore on Fri Jan 16, 2012  2:18 PM ------      Message from: Rollene Rotunda      Created: Fri Jan 16, 2012  1:50 PM       She will need Plavix 150 mg x one then 75 mg daily.

## 2012-01-19 ENCOUNTER — Other Ambulatory Visit: Payer: Self-pay | Admitting: Cardiology

## 2012-01-19 ENCOUNTER — Encounter (HOSPITAL_COMMUNITY): Payer: Self-pay | Admitting: General Practice

## 2012-01-19 ENCOUNTER — Ambulatory Visit (HOSPITAL_COMMUNITY)
Admission: RE | Admit: 2012-01-19 | Discharge: 2012-01-20 | Disposition: A | Discharge status: Home / Self Care | Payer: Medicare Other | Source: Ambulatory Visit | Attending: Cardiology | Admitting: Cardiology

## 2012-01-19 ENCOUNTER — Other Ambulatory Visit: Payer: Self-pay

## 2012-01-19 ENCOUNTER — Encounter (HOSPITAL_COMMUNITY)
Admission: RE | Disposition: A | Discharge status: Home / Self Care | Payer: Self-pay | Source: Ambulatory Visit | Attending: Cardiology

## 2012-01-19 DIAGNOSIS — E871 Hypo-osmolality and hyponatremia: Secondary | ICD-10-CM | POA: Diagnosis present

## 2012-01-19 DIAGNOSIS — E785 Hyperlipidemia, unspecified: Secondary | ICD-10-CM | POA: Diagnosis present

## 2012-01-19 DIAGNOSIS — I2510 Atherosclerotic heart disease of native coronary artery without angina pectoris: Secondary | ICD-10-CM | POA: Diagnosis present

## 2012-01-19 DIAGNOSIS — I6529 Occlusion and stenosis of unspecified carotid artery: Secondary | ICD-10-CM | POA: Diagnosis not present

## 2012-01-19 DIAGNOSIS — R9439 Abnormal result of other cardiovascular function study: Secondary | ICD-10-CM

## 2012-01-19 DIAGNOSIS — E119 Type 2 diabetes mellitus without complications: Secondary | ICD-10-CM | POA: Diagnosis not present

## 2012-01-19 DIAGNOSIS — L98499 Non-pressure chronic ulcer of skin of other sites with unspecified severity: Secondary | ICD-10-CM | POA: Diagnosis present

## 2012-01-19 DIAGNOSIS — I70209 Unspecified atherosclerosis of native arteries of extremities, unspecified extremity: Secondary | ICD-10-CM | POA: Diagnosis present

## 2012-01-19 DIAGNOSIS — I10 Essential (primary) hypertension: Secondary | ICD-10-CM | POA: Diagnosis present

## 2012-01-19 DIAGNOSIS — D649 Anemia, unspecified: Secondary | ICD-10-CM | POA: Diagnosis present

## 2012-01-19 HISTORY — DX: Unspecified osteoarthritis, unspecified site: M19.90

## 2012-01-19 HISTORY — DX: Personal history of other medical treatment: Z92.89

## 2012-01-19 HISTORY — PX: PERCUTANEOUS CORONARY STENT INTERVENTION (PCI-S): SHX5485

## 2012-01-19 HISTORY — DX: Type 2 diabetes mellitus without complications: E11.9

## 2012-01-19 HISTORY — PX: CORONARY ANGIOPLASTY WITH STENT PLACEMENT: SHX49

## 2012-01-19 LAB — BASIC METABOLIC PANEL
BUN: 16 mg/dL (ref 6–23)
CO2: 25 mEq/L (ref 19–32)
Calcium: 9.4 mg/dL (ref 8.4–10.5)
Chloride: 93 mEq/L — ABNORMAL LOW (ref 96–112)
Creatinine, Ser: 0.95 mg/dL (ref 0.50–1.10)
GFR calc Af Amer: 62 mL/min — ABNORMAL LOW (ref >90)
GFR calc non Af Amer: 54 mL/min — ABNORMAL LOW (ref >90)
Glucose, Bld: 88 mg/dL (ref 70–99)
Potassium: 4.3 mEq/L (ref 3.5–5.1)
Sodium: 129 mEq/L — ABNORMAL LOW (ref 135–145)

## 2012-01-19 LAB — CBC
HCT: 29 % — ABNORMAL LOW (ref 36.0–46.0)
Hemoglobin: 9.9 g/dL — ABNORMAL LOW (ref 12.0–15.0)
MCH: 28.4 pg (ref 26.0–34.0)
MCHC: 34.1 g/dL (ref 30.0–36.0)
MCV: 83.1 fL (ref 78.0–100.0)
Platelets: 196 10*3/uL (ref 150–400)
RBC: 3.49 MIL/uL — ABNORMAL LOW (ref 3.87–5.11)
RDW: 17.3 % — ABNORMAL HIGH (ref 11.5–15.5)
WBC: 5.7 10*3/uL (ref 4.0–10.5)

## 2012-01-19 LAB — GLUCOSE, CAPILLARY
Glucose-Capillary: 81 mg/dL (ref 70–99)
Glucose-Capillary: 94 mg/dL (ref 70–99)
Glucose-Capillary: 95 mg/dL (ref 70–99)

## 2012-01-19 LAB — PROTIME-INR
INR: 1.03 (ref 0.00–1.49)
Prothrombin Time: 13.7 seconds (ref 11.6–15.2)

## 2012-01-19 LAB — POCT ACTIVATED CLOTTING TIME: Activated Clotting Time: 384 seconds

## 2012-01-19 SURGERY — PERCUTANEOUS CORONARY STENT INTERVENTION (PCI-S)
Anesthesia: LOCAL

## 2012-01-19 MED ORDER — L-METHYLFOLATE-B6-B12 3-35-2 MG PO TABS
1.0000 | ORAL_TABLET | Freq: Every day | ORAL | Status: DC
  Administered 2012-01-20: 1 via ORAL
  Filled 2012-01-19: qty 1

## 2012-01-19 MED ORDER — IRON 325 (65 FE) MG PO TABS: 325.0000 mg | ORAL_TABLET | Freq: Two times a day (BID) | ORAL | Status: DC

## 2012-01-19 MED ORDER — ONDANSETRON HCL 4 MG/2ML IJ SOLN: 4.0000 mg | Freq: Four times a day (QID) | INTRAMUSCULAR | Status: DC | PRN

## 2012-01-19 MED ORDER — FENTANYL CITRATE 0.05 MG/ML IJ SOLN
INTRAMUSCULAR | Status: AC
  Filled 2012-01-19: qty 2

## 2012-01-19 MED ORDER — BIVALIRUDIN 250 MG IV SOLR
INTRAVENOUS | Status: AC
  Filled 2012-01-19: qty 250

## 2012-01-19 MED ORDER — HYDROCHLOROTHIAZIDE 12.5 MG PO CAPS
12.5000 mg | ORAL_CAPSULE | Freq: Every day | ORAL | Status: DC
  Administered 2012-01-20: 12.5 mg via ORAL
  Filled 2012-01-19: qty 1

## 2012-01-19 MED ORDER — FERROUS SULFATE 325 (65 FE) MG PO TABS
325.0000 mg | ORAL_TABLET | Freq: Two times a day (BID) | ORAL | Status: DC
  Administered 2012-01-19 – 2012-01-20 (×2): 325 mg via ORAL
  Filled 2012-01-19 (×4): qty 1

## 2012-01-19 MED ORDER — CLOPIDOGREL BISULFATE 75 MG PO TABS
75.0000 mg | ORAL_TABLET | Freq: Every day | ORAL | Status: DC
  Administered 2012-01-20: 75 mg via ORAL
  Filled 2012-01-19: qty 1

## 2012-01-19 MED ORDER — LISINOPRIL 10 MG PO TABS
10.0000 mg | ORAL_TABLET | Freq: Every day | ORAL | Status: DC
  Administered 2012-01-20: 10 mg via ORAL
  Filled 2012-01-19: qty 1

## 2012-01-19 MED ORDER — LISINOPRIL-HYDROCHLOROTHIAZIDE 10-12.5 MG PO TABS: 1.0000 | ORAL_TABLET | Freq: Every day | ORAL | Status: DC

## 2012-01-19 MED ORDER — HYDRALAZINE HCL 20 MG/ML IJ SOLN
10.0000 mg | Freq: Four times a day (QID) | INTRAMUSCULAR | Status: DC | PRN
  Administered 2012-01-19: 10 mg via INTRAVENOUS
  Filled 2012-01-19: qty 0.5

## 2012-01-19 MED ORDER — ASPIRIN 81 MG PO CHEW
324.0000 mg | CHEWABLE_TABLET | ORAL | Status: AC
  Administered 2012-01-19: 324 mg via ORAL
  Filled 2012-01-19: qty 4

## 2012-01-19 MED ORDER — ASPIRIN 81 MG PO CHEW: 81.0000 mg | CHEWABLE_TABLET | Freq: Every day | ORAL | Status: DC

## 2012-01-19 MED ORDER — SODIUM CHLORIDE 0.9 % IV SOLN
INTRAVENOUS | Status: DC
  Administered 2012-01-19: 75 mL/h via INTRAVENOUS

## 2012-01-19 MED ORDER — LABETALOL HCL 5 MG/ML IV SOLN
10.0000 mg | INTRAVENOUS | Status: DC | PRN
  Administered 2012-01-19: 10 mg via INTRAVENOUS
  Filled 2012-01-19: qty 4

## 2012-01-19 MED ORDER — ACETAMINOPHEN 325 MG PO TABS: 650.0000 mg | ORAL_TABLET | ORAL | Status: DC | PRN

## 2012-01-19 MED ORDER — SODIUM CHLORIDE 0.9 % IJ SOLN: 3.0000 mL | Freq: Two times a day (BID) | INTRAMUSCULAR | Status: DC

## 2012-01-19 MED ORDER — LABETALOL HCL 300 MG PO TABS
300.0000 mg | ORAL_TABLET | Freq: Two times a day (BID) | ORAL | Status: DC
  Administered 2012-01-19 – 2012-01-20 (×2): 300 mg via ORAL
  Filled 2012-01-19 (×3): qty 1

## 2012-01-19 MED ORDER — SODIUM CHLORIDE 0.9 % IJ SOLN: 3.0000 mL | INTRAMUSCULAR | Status: DC | PRN

## 2012-01-19 MED ORDER — SODIUM CHLORIDE 0.9 % IV SOLN
250.0000 mL | INTRAVENOUS | Status: DC | PRN
Start: 2012-01-19 — End: 2012-01-19

## 2012-01-19 MED ORDER — ATORVASTATIN CALCIUM 80 MG PO TABS
80.0000 mg | ORAL_TABLET | Freq: Every day | ORAL | Status: DC
  Administered 2012-01-19: 80 mg via ORAL
  Filled 2012-01-19 (×2): qty 1

## 2012-01-19 MED ORDER — ASPIRIN EC 81 MG PO TBEC
81.0000 mg | DELAYED_RELEASE_TABLET | Freq: Every day | ORAL | Status: DC
  Administered 2012-01-20: 81 mg via ORAL
  Filled 2012-01-19: qty 1

## 2012-01-19 MED ORDER — SODIUM CHLORIDE 0.9 % IV SOLN
INTRAVENOUS | Status: AC
  Administered 2012-01-19: 15:00:00 via INTRAVENOUS

## 2012-01-19 MED ORDER — MIDAZOLAM HCL 2 MG/2ML IJ SOLN
INTRAMUSCULAR | Status: AC
  Filled 2012-01-19: qty 2

## 2012-01-19 MED ORDER — MORPHINE SULFATE 2 MG/ML IJ SOLN
2.0000 mg | INTRAMUSCULAR | Status: DC | PRN
  Administered 2012-01-19 (×2): 2 mg via INTRAVENOUS
  Filled 2012-01-19 (×2): qty 1

## 2012-01-19 MED ORDER — GLIPIZIDE 5 MG PO TABS
5.0000 mg | ORAL_TABLET | Freq: Two times a day (BID) | ORAL | Status: DC
  Administered 2012-01-19 – 2012-01-20 (×2): 5 mg via ORAL
  Filled 2012-01-19 (×4): qty 1

## 2012-01-19 NOTE — CV Procedure (Addendum)
   CARDIAC CATH NOTE  Name: KIERRAH KILBRIDE MRN: [id number] DOB: 1925/11/27  Procedure: PTCA and stenting of the mid RCA  Indication: 78 year old white female recently evaluated for preoperative clearance for lower extremity vascular surgery. Stress Myoview study was abnormal showing significant inferior wall ischemia. Subsequent diagnostic cardiac catheterization demonstrated severe stenosis in the mid right coronary. There also appeared to be  moderate disease at the crux. The left coronary was without significant disease. Percutaneous intervention of the right coronary was recommended. Initial angiography demonstrates severe calcification of the proximal to mid right coronary. There is a segment of disease in the mid vessel with a more focal area to 90%. There is thin and area of asymmetric plaque at the proximal that appears about 80%.  Procedural Details: The right groin was prepped, draped, and anesthetized with 1% lidocaine. Using the modified Seldinger technique, a 6 Fr sheath was introduced into the right femoral artery. A 25 cm long sheath was used. Weight-based bivalirudin was given for anticoagulation. Once a therapeutic ACT was achieved, a 6 [place] Williams guide catheter was inserted. Guide backup was limited. A pro-water coronary guidewire was used to cross the lesion. It was very difficult to cross the area at the crux with a wire but eventually we were able to achieve good wire position in the posterior lateral branch of the right coronary.  The lesion was predilated with a 2.5 mm balloon. We were unable to pass a balloon across the lesion at the crux.  The lesion in the mid RCA was then stented with a 2.5 x 23 mm MultiLink Mini-vision stent.  The stent was postdilated with a 2.5 mm noncompliant balloon.  Following PCI, there was 0% residual stenosis and TIMI-3 flow in the mid RCA. There was no change the lesion at the crux.  The patient tolerated the procedure well. There were no  immediate procedural complications. Femoral hemostasis was achieved with manual compression. The patient was transferred to the post catheterization recovery area for further monitoring.  Lesion Data: Vessel: Mid RCA Percent stenosis (pre): 90% TIMI-flow (pre):  3 Stent:  2.5 x 23 mm MultiLink mini vision stent Percent stenosis (post):  0% TIMI-flow (post): 3  Conclusions:  1. Successful intracoronary stenting of the mid right coronary with a bare-metal stent. Unable to cross the lesion at the crux.  Recommendations: Continue dual antiplatelet therapy for one month. At that point could consider stopping Plavix and proceeding with vascular surgery.  Theron Arista [facility] 01/19/2012, 2:19 PM

## 2012-01-19 NOTE — H&P (View-Only) (Signed)
 HPI  The patient presents for preoperative evaluation prior to right below the knee bypass surgery for significant lower extremity vascular disease with a nonhealing ulcer.  She has no prior cardiac history but does have a history of stenting of her left leg in 2004. She has multiple cardiovascular risk factors. He is due to have procedure as described. Prior to this she sent for preoperative evaluation. She has also been sent for an evaluation of her anemia. She has seen a hematologist and gastroenterologist. She's never had any prior cardiac workup per her report. She denies any cardiac catheterizations or stress testing. She reports she's never had a heart attack. She has not get chest pressure, neck or arm discomfort. She does not have palpitations, presyncope or syncope. She has no shortness of breath, PND or orthopnea. However, her activities are limited to mild housework (less than 5 METs).  No Known Allergies  Current Outpatient Prescriptions  Medication Sig Dispense Refill  . atorvastatin (LIPITOR) 80 MG tablet Take 80 mg by mouth daily.      . glipiZIDE (GLUCOTROL) 5 MG tablet Take 5 mg by mouth 2 (two) times daily before a meal.      . IRON PO Take by mouth 2 (two) times daily.      . L-Methylfolate-B6-B12 (METANX PO) Take 1 tablet by mouth daily.       . LABETALOL HCL PO Take 300 mg by mouth 2 (two) times daily.       . lisinopril-hydrochlorothiazide (PRINZIDE,ZESTORETIC) 10-12.5 MG per tablet Take 1 tablet by mouth daily.      . metFORMIN (GLUCOPHAGE) 1000 MG tablet Take 500 mg by mouth 3 (three) times daily with meals.      . Multiple Vitamins-Minerals (MULTI COMPLETE PO) Take by mouth.        Past Medical History  Diagnosis Date  . Hyperlipidemia   . Hypertension   . Diabetes mellitus   . Carotid artery occlusion     Past Surgical History  Procedure Date  . Spine surgery 1989  . Eye surgery 2002    Cataract  . Eye surgery 2009    Laser bilateral   . Tonsillectomy  1946    No family history on file.  History   Social History  . Marital Status: Widowed    Spouse Name: N/A    Number of Children: N/A  . Years of Education: N/A   Occupational History  . Not on file.   Social History Main Topics  . Smoking status: Former Smoker -- 1.0 packs/day for 24 years    Types: Cigarettes    Quit date: 07/29/1987  . Smokeless tobacco: Never Used  . Alcohol Use: No  . Drug Use: No  . Sexually Active: Not on file   Other Topics Concern  . Not on file   Social History Narrative  . No narrative on file    ROS:  As stated in the HPI and negative for all other systems.  PHYSICAL EXAM BP 160/76  Pulse 89  Resp 16  Ht 5' 3" (1.6 m)  Wt 131 lb (59.421 kg)  BMI 23.21 kg/m2 GENERAL:  Well appearing HEENT:  Pupils equal round and reactive, fundi not visualized, oral mucosa unremarkable NECK:  No jugular venous distention, waveform within normal limits, carotid upstroke brisk and symmetric, no bruits, no thyromegaly LYMPHATICS:  No cervical, inguinal adenopathy LUNGS:  Clear to auscultation bilaterally BACK:  No CVA tenderness CHEST:  Unremarkable HEART:  PMI not displaced   or sustained,S1 and S2 within normal limits, no S3, no S4, no clicks, no rubs, soft apical early peaking systolic murmur radiating out the aortic outflow tract ABD:  Flat, positive bowel sounds normal in frequency in pitch, no bruits, no rebound, no guarding, no midline pulsatile mass, no hepatomegaly, no splenomegaly EXT:  2 plus pulses upper absent dorsalis pedis and posterior tibialis bilateral, no edema, no cyanosis no clubbing SKIN:  No rashes no nodules NEURO:  Cranial nerves II through XII grossly intact, motor grossly intact throughout PSYCH:  Cognitively intact, oriented to person place and time  EKG:  Normal sinus rhythm, rate 89, low voltage in chest leads, possible old anteroseptal microinfarction. 10/31/11  ASSESSMENT AND PLAN    

## 2012-01-19 NOTE — Progress Notes (Signed)
Site area: right groin  Site Prior to Removal:  Level 0  Pressure Applied For 25 MINUTES    Minutes Beginning at 21:40  Manual:   yes  Patient Status During Pull:  WNL  Post Pull Groin Site:  Level 0  Post Pull Instructions Given:  yes  Post Pull Pulses Present:  yes  Dressing Applied:  yes  Comments:

## 2012-01-19 NOTE — Interval H&P Note (Signed)
History and Physical Interval Note:  01/19/2012 12:51 PM  Cynthia Ray  has presented today for surgery, with the diagnosis of CAD  The various methods of treatment have been discussed with the patient and family. After consideration of risks, benefits and other options for treatment, the patient has consented to  Procedure(s) (LRB): PERCUTANEOUS CORONARY STENT INTERVENTION (PCI-S) (N/A) as a surgical intervention .  The patient's history has been reviewed, patient examined, no change in status, stable for surgery.  I have reviewed the patients' chart and labs.  Questions were answered to the patient's satisfaction.     Theron Arista [facility] 01/19/2012 12:52 PM

## 2012-01-20 ENCOUNTER — Ambulatory Visit: Payer: Medicare Other | Admitting: Cardiology

## 2012-01-20 ENCOUNTER — Other Ambulatory Visit: Payer: Self-pay

## 2012-01-20 DIAGNOSIS — E871 Hypo-osmolality and hyponatremia: Secondary | ICD-10-CM | POA: Diagnosis present

## 2012-01-20 DIAGNOSIS — D649 Anemia, unspecified: Secondary | ICD-10-CM | POA: Diagnosis present

## 2012-01-20 DIAGNOSIS — I2510 Atherosclerotic heart disease of native coronary artery without angina pectoris: Secondary | ICD-10-CM | POA: Diagnosis present

## 2012-01-20 DIAGNOSIS — L98499 Non-pressure chronic ulcer of skin of other sites with unspecified severity: Secondary | ICD-10-CM | POA: Diagnosis not present

## 2012-01-20 DIAGNOSIS — I739 Peripheral vascular disease, unspecified: Secondary | ICD-10-CM | POA: Diagnosis not present

## 2012-01-20 DIAGNOSIS — R9439 Abnormal result of other cardiovascular function study: Secondary | ICD-10-CM

## 2012-01-20 LAB — CBC
HCT: 25.6 % — ABNORMAL LOW (ref 36.0–46.0)
Hemoglobin: 8.9 g/dL — ABNORMAL LOW (ref 12.0–15.0)
MCH: 28.5 pg (ref 26.0–34.0)
MCHC: 34.8 g/dL (ref 30.0–36.0)
MCV: 82.1 fL (ref 78.0–100.0)
Platelets: 201 10*3/uL (ref 150–400)
RBC: 3.12 MIL/uL — ABNORMAL LOW (ref 3.87–5.11)
RDW: 17.6 % — ABNORMAL HIGH (ref 11.5–15.5)
WBC: 6.2 10*3/uL (ref 4.0–10.5)

## 2012-01-20 LAB — BASIC METABOLIC PANEL
BUN: 12 mg/dL (ref 6–23)
CO2: 25 mEq/L (ref 19–32)
Calcium: 8.7 mg/dL (ref 8.4–10.5)
Chloride: 95 mEq/L — ABNORMAL LOW (ref 96–112)
Creatinine, Ser: 0.88 mg/dL (ref 0.50–1.10)
GFR calc Af Amer: 69 mL/min — ABNORMAL LOW (ref >90)
GFR calc non Af Amer: 59 mL/min — ABNORMAL LOW (ref >90)
Glucose, Bld: 119 mg/dL — ABNORMAL HIGH (ref 70–99)
Potassium: 3.8 mEq/L (ref 3.5–5.1)
Sodium: 129 mEq/L — ABNORMAL LOW (ref 135–145)

## 2012-01-20 LAB — GLUCOSE, CAPILLARY: Glucose-Capillary: 103 mg/dL — ABNORMAL HIGH (ref 70–99)

## 2012-01-20 MED ORDER — NITROGLYCERIN 0.4 MG SL SUBL: 0.4000 mg | SUBLINGUAL_TABLET | SUBLINGUAL | Status: DC | PRN

## 2012-01-20 MED ORDER — CLOPIDOGREL BISULFATE 75 MG PO TABS: 75.0000 mg | ORAL_TABLET | Freq: Every day | ORAL | Status: AC

## 2012-01-20 MED ORDER — ASPIRIN 81 MG PO TBEC: 81.0000 mg | DELAYED_RELEASE_TABLET | Freq: Every day | ORAL | Status: AC

## 2012-01-20 MED FILL — Dextrose Inj 5%: INTRAVENOUS | Qty: 50 | Status: AC

## 2012-01-20 NOTE — Care Management Note (Signed)
    Page 1 of 1   01/20/2012     10:31:39 AM   CARE MANAGEMENT NOTE 01/20/2012  Patient:  Cynthia Ray, Cynthia Ray   Account Number:  [id number]  Date Initiated:  01/20/2012  Documentation initiated by:  Alvira Philips Assessment:   78 yr-old female adm with dx of CAD; lives alone, has walker and glucometer.     DC Planning Services  CM consult       HH arranged  HH - 11 Patient Refused      Status of service:  Completed, signed off Medicare Important Message given?    Discharge Disposition:  HOME/SELF CARE  Comments:  PCP:  Lia Hopping A  Contact:  Cynthia Ray, daughter [phone]  01/20/12 0955 Cynthia Starkel RN MSN CCM Met with pt and daughter @ bedside.  Discussed home health RN to assess/monitor VS/heart and lung sounds/(R) great toe wound, PT safety eval.  Pt and daughter both refuse. Daughter lives across the street from pt, states she is available as needed.  Pt has agreed to use walker when ambulating and states she will be undergoing (R) LE stent placement in one month.

## 2012-01-20 NOTE — Discharge Summary (Signed)
Patient seen and examined and history reviewed. Agree with above findings and plan. See rounding note earlier today. Plan to continue ASA 81 mg and Plavix for one month then could stop Plavix for vascular surgery.   Theron Arista [facility] 01/20/2012 11:28 AM

## 2012-01-20 NOTE — Progress Notes (Signed)
CARDIAC REHAB PHASE I   PRE:  Rate/Rhythm: 81SR  BP:  Supine:   Sitting: 144/53  Standing:    SaO2:   MODE:  Ambulation: 100 ft   POST:  Rate/Rhythem: 94SR  BP:  Supine:   Sitting: 145/48  Standing:    SaO2:  0845-0950 Pt walked to bathroom and then 100 ft after shoes applied. Needed handheld asst to walk. Encouraged pt to have someone with her next few days since she was a little weak. Daughter stated pt does not walk much except in house due to foot pain. Education completed. Did not give ex ed due to mobility issues. Not appropriate for CRP 2 due to mobility issues. To recliner with call bell after walk.  Duanne Limerick

## 2012-01-20 NOTE — Discharge Instructions (Signed)
PLEASE REMEMBER TO BRING ALL OF YOUR MEDICATIONS TO EACH OF YOUR FOLLOW-UP OFFICE VISITS. PLEASE TAKE ALL NEW MEDICATIONS/MEDICATION CHANGES AS PRESCRIBED.  PLEASE ATTEND ALL FOLLOW-UP APPOINTMENTS.  CONTINUE TO TAKE ASPIRIN AND PLAVIX FOR AT LEAST 1 MONTH. FURTHER RECOMMENDATIONS WILL BE MADE ON YOUR FOLLOW-UP APPOINTMENT WITH DR. HOCHREIN.   Activity: Increase activity slowly as tolerated. You may shower, but no soaking baths for 6 days. No driving for 1 day. No lifting over 5 lbs for 6 days. No sexual activity for 6 days.   You May Return to Work: in 6 days (if applicable)  Wound Care: You may wash cath site gently with soap and water. Keep cath site clean and dry. If you notice pain, swelling, bleeding or pus at your cath site, please call [phone].   Gastrointestinal Bleeding  Bleeding in the gastrointestinal tract comes out when you throw up (vomit) or poop. Treatment will depend on how fast the blood is flowing, where it is coming from, and the cause. A small amount of bleeding that stops on its own may not need treatment.  HOME CARE  Do not drink alcohol.  Do not eat things that upset your stomach or give you heartburn.  Rest and limit your activity.  Do not smoke. Smoking may make your problems worse.  Wash your hands or use sanitizer every time you use the bathroom. Some bleeding is caused by germs.  Only take medicine as told by your doctor.  GET HELP RIGHT AWAY IF:  Your throw up looks like coffee grounds or is dark or bright red.  Your poop is black or tarry. You see blood in the toilet.  You feel weak, dizzy, and short of breath.  You breathe fast and have a fast heartbeat.  You have bad stomach pain or cramping.  MAKE SURE YOU:  Understand these instructions.  Will watch your condition.  Will get help right away if you are not doing well or get worse.  Document Released: 04/22/2008 Document Revised: 07/03/2011 Document Reviewed: 06/23/2011  [facility] Patient Information  2012 [place], [place].  Coronary Angiography with Stent This is a procedure to widen or open a narrow blood vessel of the heart (coronary artery). When a coronary artery becomes partially blocked it decreases blood flow to that area. This may lead to chest pain or a heart attack (myocardial infarction). Arteries may become blocked by cholesterol buildup (plaque) in the lining or wall. A stent is a small piece of metal that looks like a mesh or a spring. Stent placement may be done right after an angiogram that finds a blocked artery or as a treatment for a heart attack. RISKS AND COMPLICATIONS  Damage to the heart.   A blockage may return.   Bleeding at the site.   Blood clot to another part of the body.  PROCEDURE  You may be given a medication to help you relax before and during the procedure through an IV in your hand or arm.   A local anesthetic to make the area numb may be used before inserting the catheter (a long, hollow tube about the size of a piece of cooked spaghetti).   You will be prepared for the procedure by washing and shaving the area where the catheter will be inserted. This is usually done in the groin.   A specially trained doctor will insert the catheter with a guide wire into an artery. This is guided under a special type of X-ray (fluoroscopy) to the opening  of the blocked artery.   Special dye is then injected and X-rays are taken.   A tiny wire is guided to the blocked spot and a balloon is inflated to make the artery wider. The stent is expanded and crushes the plaque into the wall of the vessel. The stent holds the area open like a scaffolding and improves the blood flow.   Sometimes the artery may be made wider using a laser or other tools to remove plaque.   When the blood flow is better, the catheter is removed. The lining of the artery will grow over the stent which stays where it was placed.  AFTER THE PROCEDURE  You will stay in bed for several hours.    The access site will be watched and you will be checked frequently.   Blood tests, X-rays and an EKG may be done.   You may stay in the hospital overnight for observation.  SEEK IMMEDIATE MEDICAL CARE IF:   You develop chest pain, shortness of breath, feel faint, or pass out.   There is bleeding, swelling, or drainage from the catheter insertion site.   You develop pain, discoloration, coldness, or severe bruising in the leg or arm that held the catheter.   You see blood in your urine or stool. This may be bright red blood in urine or stools, or also appear as black, tarry stools.   You have a fever.  Document Released: 01/18/2003 Document Revised: 07/03/2011 Document Reviewed: 09/10/2007 [facility] Patient Information 2012 [place], [place].Groin Site Care Refer to this sheet in the next few weeks. These instructions provide you with information on caring for yourself after your procedure. Your caregiver may also give you more specific instructions. Your treatment has been planned according to current medical practices, but problems sometimes occur. Call your caregiver if you have any problems or questions after your procedure. HOME CARE INSTRUCTIONS  You may shower 24 hours after the procedure. Remove the bandage (dressing) and gently wash the site with plain soap and water. Gently pat the site dry.   Do not apply powder or lotion to the site.   Do not sit in a bathtub, swimming pool, or whirlpool for 5 to 7 days.   No bending, squatting, or lifting anything over 10 pounds (4.5 kg) as directed by your caregiver.   Inspect the site at least twice daily.   Do not drive home if you are discharged the same day of the procedure. Have someone else drive you.   You may drive 24 hours after the procedure unless otherwise instructed by your caregiver.  What to expect:  Any bruising will usually fade within 1 to 2 weeks.   Blood that collects in the tissue (hematoma) may be painful to  the touch. It should usually decrease in size and tenderness within 1 to 2 weeks.  SEEK IMMEDIATE MEDICAL CARE IF:  You have unusual pain at the groin site or down the affected leg.   You have redness, warmth, swelling, or pain at the groin site.   You have drainage (other than a small amount of blood on the dressing).   You have chills.   You have a fever or persistent symptoms for more than 72 hours.   You have a fever and your symptoms suddenly get worse.   Your leg becomes pale, cool, tingly, or numb.   You have heavy bleeding from the site. Hold pressure on the site.  Document Released: 08/16/2010 Document Revised: 07/03/2011 Document Reviewed:  08/16/2010 [employer] Patient Information 2012 [employer], LLCNitroglycerin sublingual tablets What is this medicine? NITROGLYCERIN (nye troe GLI ser in) is a type of vasodilator. It relaxes blood vessels, increasing the blood and oxygen supply to your heart. This medicine is used to relieve chest pain caused by angina. It is also used to prevent chest pain before activities like climbing stairs, going outdoors in cold weather, or sexual activity. This medicine may be used for other purposes; ask your health care provider or pharmacist if you have questions. What should I tell my health care provider before I take this medicine? They need to know if you have any of these conditions: -anemia -head injury, recent stroke, or bleeding in the brain -liver disease -previous heart attack -an unusual or allergic reaction to nitroglycerin, other medicines, foods, dyes, or preservatives -pregnant or trying to get pregnant -breast-feeding How should I use this medicine? Take this medicine by mouth as needed. At the first sign of an angina attack (chest pain or tightness) place one tablet under your tongue. You can also take this medicine 5 to 10 minutes before an event likely to produce chest pain. Follow the directions on the prescription label. Let  the tablet dissolve under the tongue. Do not swallow whole. Replace the dose if you accidentally swallow it. It will help if your mouth is not dry. Saliva around the tablet will help it to dissolve more quickly. Do not eat or drink, smoke or chew tobacco while a tablet is dissolving. If you are not better within 5 minutes after taking ONE dose of nitroglycerin, call 9-1-1 immediately to seek emergency medical care. Do not take more than 3 nitroglycerin tablets over 15 minutes. If you take this medicine often to relieve symptoms of angina, your doctor or health care professional may provide you with different instructions to manage your symptoms. If symptoms do not go away after following these instructions, it is important to call 9-1-1 immediately. Do not take more than 3 nitroglycerin tablets over 15 minutes. Talk to your pediatrician regarding the use of this medicine in children. Special care may be needed. Overdosage: If you think you have taken too much of this medicine contact a poison control center or emergency room at once. NOTE: This medicine is only for you. Do not share this medicine with others. What if I miss a dose? This does not apply. This medicine is only used as needed. What may interact with this medicine? Do not take this medicine with any of the following medications: -certain migraine medicines like ergotamine and dihydroergotamine (DHE) -medicines used to treat erectile dysfunction like sildenafil, tadalafil, and vardenafil This medicine may also interact with the following medications: -alteplase -aspirin -heparin -medicines for high blood pressure -medicines for mental depression -other medicines used to treat angina -phenothiazines like chlorpromazine, mesoridazine, prochlorperazine, thioridazine This list may not describe all possible interactions. Give your health care provider a list of all the medicines, herbs, non-prescription drugs, or dietary supplements you use.  Also tell them if you smoke, drink alcohol, or use illegal drugs. Some items may interact with your medicine. What should I watch for while using this medicine? Tell your doctor or health care professional if you feel your medicine is no longer working. Keep this medicine with you at all times. Sit or lie down when you take your medicine to prevent falling if you feel dizzy or faint after using it. Try to remain calm. This will help you to feel better faster. If you feel  dizzy, take several deep breaths and lie down with your feet propped up, or bend forward with your head resting between your knees. You may get drowsy or dizzy. Do not drive, use machinery, or do anything that needs mental alertness until you know how this drug affects you. Do not stand or sit up quickly, especially if you are an older patient. This reduces the risk of dizzy or fainting spells. Alcohol can make you more drowsy and dizzy. Avoid alcoholic drinks. Do not treat yourself for coughs, colds, or pain while you are taking this medicine without asking your doctor or health care professional for advice. Some ingredients may increase your blood pressure. What side effects may I notice from receiving this medicine? Side effects that you should report to your doctor or health care professional as soon as possible: -blurred vision -dry mouth -skin rash -sweating -the feeling of extreme pressure in the head -unusually weak or tired Side effects that usually do not require medical attention (report to your doctor or health care professional if they continue or are bothersome): -flushing of the face or neck -headache -irregular heartbeat, palpitations -nausea, vomiting This list may not describe all possible side effects. Call your doctor for medical advice about side effects. You may report side effects to FDA at 1-800-FDA-1088. Where should I keep my medicine? Keep out of the reach of children. Store at room temperature  between 20 and 25 degrees C (68 and 77 degrees F). Store in Retail buyer. Protect from light and moisture. Keep tightly closed. Throw away any unused medicine after the expiration date. NOTE: This sheet is a summary. It may not cover all possible information. If you have questions about this medicine, talk to your doctor, pharmacist, or health care provider.  2012, [employer]. (02/04/2008 5:16:24 PM)Aspirin, ASA oral tablets What is this medicine? ASPIRIN (AS pir in) is a pain reliever. It is used to treat mild pain and fever. This medicine is also used as directed by a doctor to prevent and to treat heart attacks, to prevent strokes, and to treat arthritis or inflammation. This medicine may be used for other purposes; ask your health care provider or pharmacist if you have questions. What should I tell my health care provider before I take this medicine? They need to know if you have any of these conditions: -anemia -asthma -bleeding problems -child with chickenpox, the flu, or other viral infection -diabetes -gout -if you frequently drink alcohol containing drinks -kidney disease -liver disease -low level of vitamin K -lupus -smoke tobacco -stomach ulcers or other problems -an unusual or allergic reaction to aspirin, tartrazine dye, other medicines, dyes, or preservatives -pregnant or trying to get pregnant -breast-feeding How should I use this medicine? Take this medicine by mouth with a glass of water. Follow the directions on the package or prescription label. You can take this medicine with or without food. If it upsets your stomach, take it with food. Do not take your medicine more often than directed. Talk to your pediatrician regarding the use of this medicine in children. While this drug may be prescribed for children as young as 66 years of age for selected conditions, precautions do apply. Children and teenagers should not use this medicine to treat chicken pox  or flu symptoms unless directed by a doctor. Patients over 56 years old may have a stronger reaction and need a smaller dose. Overdosage: If you think you have taken too much of this medicine contact a poison control center  or emergency room at once. NOTE: This medicine is only for you. Do not share this medicine with others. What if I miss a dose? If you are taking this medicine on a regular schedule and miss a dose, take it as soon as you can. If it is almost time for your next dose, take only that dose. Do not take double or extra doses. What may interact with this medicine? Do not take this medicine with any of the following medications: -cidofovir -ketorolac -probenecid This medicine may also interact with the following medications: -alcohol -alendronate -bismuth subsalicylate -flavocoxid -herbal supplements like feverfew, garlic, ginger, ginkgo biloba, horse chestnut -medicines for diabetes or glaucoma like acetazolamide, methazolamide -medicines for gout -medicines that treat or prevent blood clots like enoxaparin, heparin, ticlopidine, warfarin -other aspirin and aspirin-like medicines -NSAIDs, medicines for pain and inflammation, like ibuprofen or naproxen -pemetrexed -sulfinpyrazone -varicella live vaccine This list may not describe all possible interactions. Give your health care provider a list of all the medicines, herbs, non-prescription drugs, or dietary supplements you use. Also tell them if you smoke, drink alcohol, or use illegal drugs. Some items may interact with your medicine. What should I watch for while using this medicine? If you are treating yourself for pain, tell your doctor or health care professional if the pain lasts more than 10 days, if it gets worse, or if there is a new or different kind of pain. Tell your doctor if you see redness or swelling. Also, check with your doctor if you have a fever that lasts for more than 3 days. Only take this medicine to  prevent heart attacks or blood clotting if prescribed by your doctor or health care professional. Do not take aspirin or aspirin-like medicines with this medicine. Too much aspirin can be dangerous. Always read the labels carefully. This medicine can irritate your stomach or cause bleeding problems. Do not smoke cigarettes or drink alcohol while taking this medicine. Do not lie down for 30 minutes after taking this medicine to prevent irritation to your throat. If you are scheduled for any medical or dental procedure, tell your healthcare provider that you are taking this medicine. You may need to stop taking this medicine before the procedure. What side effects may I notice from receiving this medicine? Side effects that you should report to your doctor or health care professional as soon as possible: -allergic reactions like skin rash, itching or hives, swelling of the face, lips, or tongue -black, tarry stools -bloody, coffee ground-like vomit -breathing problems -changes in hearing, ringing in the ears -confusion -general ill feeling or flu-like symptoms -pain on swallowing -redness, blistering, peeling or loosening of the skin, including inside the mouth or nose -trouble passing urine or change in the amount of urine -unusual bleeding or bruising -unusually weak or tired -yellowing of the eyes or skin Side effects that usually do not require medical attention (report to your doctor or health care professional if they continue or are bothersome): -diarrhea or constipation -nausea, vomiting -stomach gas, heartburn This list may not describe all possible side effects. Call your doctor for medical advice about side effects. You may report side effects to FDA at 1-800-FDA-1088. Where should I keep my medicine? Keep out of the reach of children. Store at room temperature between 15 and 30 degrees C (59 and 86 degrees F). Protect from heat and moisture. Do not use this medicine if it has a  strong vinegar smell. Throw away any unused medicine after the expiration  date. NOTE: This sheet is a summary. It may not cover all possible information. If you have questions about this medicine, talk to your doctor, pharmacist, or health care provider.  2012, [employer]. (10/05/2007 10:44:17 AM).

## 2012-01-20 NOTE — Progress Notes (Signed)
   TELEMETRY: Reviewed telemetry pt in [facility]: Filed Vitals:   01/19/12 2350 01/20/12 0000 01/20/12 0410 01/20/12 0530  BP: 171/49 160/50 127/46 103/35  Pulse: 86 87 81 78  Temp: 98.1 F (36.7 C)     TempSrc: Oral     Resp: 13 7 16 12   Height:      Weight:      SpO2: 97% 95% 98% 96%    Intake/Output Summary (Last 24 hours) at 01/20/12 0650 Last data filed at 01/20/12 0410  Gross per 24 hour  Intake    615 ml  Output    125 ml  Net    490 ml    SUBJECTIVE Feels well this am. No chest pain. Back pain much better now that she can move. No groin pain.  LABS: Basic Metabolic Panel:  Basename 01/20/12 0400 01/19/12 1126  NA 129* 129*  K 3.8 4.3  CL 95* 93*  CO2 25 25  GLUCOSE 119* 88  BUN 12 16  CREATININE 0.88 0.95  CALCIUM 8.7 9.4  MG -- --  PHOS -- --   CBC:  Basename 01/20/12 0400 01/19/12 1126  WBC 6.2 5.7  NEUTROABS -- --  HGB 8.9* 9.9*  HCT 25.6* 29.0*  MCV 82.1 83.1  PLT 201 196   Radiology/Studies:  No results found.  PHYSICAL EXAM General: Elderly, pale, in no acute distress. Head: Normal Neck: Negative for carotid bruits. JVD not elevated. Lungs: Clear bilaterally to auscultation without wheezes, rales, or rhonchi. Breathing is unlabored. Heart: RRR S1 S2 with soft 1/6 systolic murmur RUSB.  Abdomen: Soft, non-tender, non-distended with normoactive bowel sounds. No hepatomegaly. No rebound/guarding. No obvious abdominal masses. Msk:  Strength and tone appears normal for age. Extremities: No clubbing, cyanosis or edema.  Distal pedal pulses are 2+ and equal bilaterally. Right groin without hematoma. Neuro: Alert and oriented X 3. Moves all extremities spontaneously. Psych:  Responds to questions appropriately with a normal affect.  ASSESSMENT AND PLAN: 1. CAD with abnormal myoview study. Now s/p stent BMS of mid RCA 2. Hyponatremia-chronic. 3. PAD with heel ulceration. 4. HTN improved this am 5. Hyperlipidemia. 6. Anemia HGB 9.9>>8.9 with  expected blood loss during procedure.  Plan: discharge home today. Will need Plavix for 1 month. Will discuss ASA use with Dr. Antoine Poche. If possible would delay lower extremity arterial surgery for 1 month. Follow up with Dr. Antoine Poche as outpatient.  Principal Problem:  *CAD (coronary artery disease) Active Problems:  Occlusion and stenosis of carotid artery without mention of cerebral infarction  Atherosclerotic PVD with ulceration  HTN (hypertension)  Dyslipidemia  Anemia  Hyponatremia    Signed, Leighla Chestnutt [place] MD,FACC 01/20/2012 6:54 AM

## 2012-01-20 NOTE — Discharge Summary (Signed)
Discharge Summary   Patient ID: Cynthia Ray,  MRN: [id number], DOB/AGE: 76/27/1927 76 y.o.  Admit date: 01/19/2012 Discharge date: 01/20/2012  Discharge Diagnoses Principal Problem:  *CAD (coronary artery disease) Active Problems:  Occlusion and stenosis of carotid artery without mention of cerebral infarction  Atherosclerotic PVD with ulceration  HTN (hypertension)  Dyslipidemia  Anemia  Hyponatremia   Allergies Allergies  Allergen Reactions  . Aspirin Other (See Comments)    Dr. Teena Dunk informed patient not to take this due to ulcer  . Ibuprofen Other (See Comments)    Dr. Teena Dunk informed patient not to take this due to ulcer     Diagnostic Studies/Procedures  CARDIAC CATHETERIZATION + PCI- 01/19/12  Procedure: PTCA and stenting of the mid RCA  Indication: 78 year old white female recently evaluated for preoperative clearance for lower extremity vascular surgery. Stress Myoview study was abnormal showing significant inferior wall ischemia. Subsequent diagnostic cardiac catheterization demonstrated severe stenosis in the mid right coronary. There also appeared to be moderate disease at the crux. The left coronary was without significant disease. Percutaneous intervention of the right coronary was recommended. Initial angiography demonstrates severe calcification of the proximal to mid right coronary. There is a segment of disease in the mid vessel with a more focal area to 90%. There is thin and area of asymmetric plaque at the proximal that appears about 80%.  Procedural Details: The right groin was prepped, draped, and anesthetized with 1% lidocaine. Using the modified Seldinger technique, a 6 Fr sheath was introduced into the right femoral artery. A 25 cm long sheath was used. Weight-based bivalirudin was given for anticoagulation. Once a therapeutic ACT was achieved, a 6 [place] Williams guide catheter was inserted. Guide backup was limited. A pro-water coronary guidewire  was used to cross the lesion. It was very difficult to cross the area at the crux with a wire but eventually we were able to achieve good wire position in the posterior lateral branch of the right coronary. The lesion was predilated with a 2.5 mm balloon. We were unable to pass a balloon across the lesion at the crux. The lesion in the mid RCA was then stented with a 2.5 x 23 mm MultiLink Mini-vision stent. The stent was postdilated with a 2.5 mm noncompliant balloon. Following PCI, there was 0% residual stenosis and TIMI-3 flow in the mid RCA. There was no change the lesion at the crux. The patient tolerated the procedure well. There were no immediate procedural complications. Femoral hemostasis was achieved with manual compression. The patient was transferred to the post catheterization recovery area for further monitoring.  Lesion Data:  Vessel: Mid RCA  Percent stenosis (pre): 90%  TIMI-flow (pre): 3  Stent: 2.5 x 23 mm MultiLink mini vision stent  Percent stenosis (post): 0%  TIMI-flow (post): 3   Conclusions:  1. Successful intracoronary stenting of the mid right coronary with a bare-metal stent. Unable to cross the lesion at the crux.  History of Present Illness  Cynthia Ray is a 78yo female with PMHx significant for the above problem list who was admitted to [facility] on 01/19/12 for elective cardiac catheterization after an abnormal Myoview.   She had originally seen Dr. Antoine Poche earlier this month for preop evaluation to undergo R below the knee bypass surgery for significant lower extremity vascular disease with a nonhealing ulcer. She underwent Lexiscan Myoview on 01/01/12 revealing medium, completely reversible, mid- to basal inferolateral defect associated with normal wall motion consistent with ischemia. LVEF 66%.  The patient was subsequently scheduled for elective diagnostic cardiac catheterization +/- PCI.   Hospital Course   She was instructed to arrive in short-stay the  morning of the procedure in a fasting state. She arrived as instructed and was found to be stable for the procedure. She was informed, consented and prepped for the procedure which was accessed via the R groin. The full details are outlined above. Notably, a 90% mid RCA lesion was visualized s/p successful intracoronary BMS placement with 0% post-PCI result. The recommendation was made to continue DAPT for one month, with consideration of stopping Plavix and continuing with vascular procedure at that point. The patient tolerated the procedure well without complications. She has chronic anemia, and Hgb decreased as expected post-procedurally due to blood loss. This was assessed by Dr. Swaziland and found to be stable.   There were no overnight events. She ambulated well with cardiac rehab without incident. The patient was evaluated and examined this morning, and found to be stable for discharge. She will continue ASA/Plavix for at least 1 month. NTG SL PRN has been added as well. She will continue all other outpatient medications. She will follow-up with Dr. Antoine Poche in the [place] office in approximately 2 weeks as outlined below. Per Dr. Swaziland, DAPT will be continued with aims of postponing vascular surgery for at least 1 month to derive benefit of Plavix during this time. Aspirin will be continued until after surgery, and further determinations will be made on follow-up with Dr. Antoine Poche. This information, including activity restrictions and supplemental material for GI bleeding given her chronic anemia and antiplatelet initiation, is clearly outlined in the discharge AVS.   Discharge Vitals:  Blood pressure 155/50, pulse 79, temperature 98 F (36.7 C), temperature source Oral, resp. rate 12, height 5' 2.99" (1.6 m), weight 59.4 kg (130 lb 15.3 oz), SpO2 95.00%.   Labs: Recent Labs  Basename 01/20/12 0400 01/19/12 1126   WBC 6.2 5.7   HGB 8.9* 9.9*   HCT 25.6* 29.0*   MCV 82.1 83.1   PLT 201 196   Lab  01/20/12 0400 01/19/12 1126  NA 129* 129*  K 3.8 4.3  CL 95* 93*  CO2 25 25  BUN 12 16  CREATININE 0.88 0.95  CALCIUM 8.7 9.4  PROT -- --  BILITOT -- --  ALKPHOS -- --  ALT -- --  AST -- --  AMYLASE -- --  LIPASE -- --  GLUCOSE 119* 88   Disposition:  Discharge Orders    Future Appointments: Provider: Department: Dept Phone: Center:   01/22/2012 9:15 AM Sherren Kerns, MD [facility] [phone] [facility]   02/02/2012 3:00 PM Rollene Rotunda, MD [facility] [phone] [facility]     Follow-up Information    Follow up with [facility] on 02/02/2012. (At 3:00 PM with Dr. Antoine Poche for follow-up after your heart catheterization. )    Contact information:   [address] [place] [address]         Discharge Medications:  Medication List  As of 01/20/2012  9:43 AM   START taking these medications         aspirin 81 MG EC tablet   Take 1 tablet (81 mg total) by mouth daily.      nitroGLYCERIN 0.4 MG SL tablet   Commonly known as: NITROSTAT   Place 1 tablet (0.4 mg total) under the tongue every 5 (five) minutes as needed for chest pain.  CHANGE how you take these medications         clopidogrel 75 MG tablet   Commonly known as: PLAVIX   Take 1 tablet (75 mg total) by mouth daily with breakfast.   What changed: - how often to take the med - doctor's instructions         CONTINUE taking these medications         atorvastatin 80 MG tablet   Commonly known as: LIPITOR      glipiZIDE 5 MG tablet   Commonly known as: GLUCOTROL      IRON PO      labetalol 300 MG tablet   Commonly known as: NORMODYNE      lisinopril-hydrochlorothiazide 10-12.5 MG per tablet   Commonly known as: PRINZIDE,ZESTORETIC      METANX PO      metFORMIN 500 MG tablet   Commonly known as: GLUCOPHAGE      MULTI COMPLETE PO          Where to get your medications    These are the prescriptions that you need to pick up. We sent them to a  specific pharmacy, so you will need to go there to get them.   [facility] - [place], [place] - [address]    [address] [place] [address]    Phone: [phone]        clopidogrel 75 MG tablet   nitroGLYCERIN 0.4 MG SL tablet         Information on where to get these meds is not yet available. Ask your nurse or doctor.         aspirin 81 MG EC tablet           Outstanding Labs/Studies: None  Duration of Discharge Encounter: Greater than 30 minutes including physician time.  Signed, R. Hurman Horn, PA-C 01/20/2012, 9:43 AM

## 2012-01-21 ENCOUNTER — Telehealth: Payer: Self-pay | Admitting: *Deleted

## 2012-01-21 NOTE — Telephone Encounter (Signed)
I called the patient to see why she cancelled her appt with Dr Darrick Penna. She said that Dr Antoine Poche had just done a coronary stent 01/19/12 and that she had an appt with him 02/02/12 at 3pm. After that appt she will call to reschedule with Dr Darrick Penna

## 2012-01-21 NOTE — Telephone Encounter (Signed)
Message copied by Melene Plan on Wed Jan 21, 2012  1:46 PM ------      Message from: Sara Chu      Created: Tue Jan 20, 2012  2:19 PM       Patient cancelled her appointment, didn't know if you wanted to make note. She stated that she had a recent procedure and wished to cancel appt, she said she would call back to reschedule. This is the appt note that was attached -- eval r fem pop after cardiac clearance per Tawan Corkern on 01/07/12/dr.hochrein saw pt on 12/30/11/cardiac clearance notes in b/f/pt aware/awt

## 2012-01-22 ENCOUNTER — Ambulatory Visit: Payer: Medicare Other | Admitting: Vascular Surgery

## 2012-02-02 ENCOUNTER — Ambulatory Visit (INDEPENDENT_AMBULATORY_CARE_PROVIDER_SITE_OTHER): Payer: Medicare Other | Admitting: Cardiology

## 2012-02-02 ENCOUNTER — Encounter: Payer: Self-pay | Admitting: Cardiology

## 2012-02-02 VITALS — BP 149/76 | HR 84 | Ht 63.0 in | Wt 131.4 lb

## 2012-02-02 DIAGNOSIS — I2510 Atherosclerotic heart disease of native coronary artery without angina pectoris: Secondary | ICD-10-CM

## 2012-02-02 DIAGNOSIS — I10 Essential (primary) hypertension: Secondary | ICD-10-CM

## 2012-02-02 DIAGNOSIS — E785 Hyperlipidemia, unspecified: Secondary | ICD-10-CM | POA: Diagnosis not present

## 2012-02-02 NOTE — Assessment & Plan Note (Signed)
The patient will continue the above medications including the Plavix through July 24th.  She would be okay to stop this medication 5 days prior to any vascular surgery. The surgery to be done anytime after that. I will send this note to Dr. Darrick Penna and I have discussed this with the patient.

## 2012-02-02 NOTE — Progress Notes (Signed)
HPI  The patient initially presented for preoperative evaluation prior to right below the knee bypass surgery for significant lower extremity vascular disease with a nonhealing ulcer.  I performed a stress perfusion study preoperatively which demonstrated inferior ischemia. She was subsequently found on catheterization to have ostial LAD 60% stenosis. There was circumflex AV groove proximal 40% stenosis. An obtuse marginal had 50% stenosis. She had tight mid right coronary artery 95% stenosis. She underwent bare-metal stenting.  Since her procedure she has done well. She's had no chest discomfort, neck or arm discomfort. She still has pain in her legs. She had no problems with the catheterization site with no bleeding or bruising. She's had no new shortness of breath, PND or orthopnea. She is tolerating aspirin and Plavix despite her past GI history.  Allergies  Allergen Reactions  . Aspirin Other (See Comments)    Dr. Teena Dunk informed patient not to take this due to ulcer  . Ibuprofen Other (See Comments)    Dr. Teena Dunk informed patient not to take this due to ulcer     Current Outpatient Prescriptions  Medication Sig Dispense Refill  . aspirin EC 81 MG EC tablet Take 1 tablet (81 mg total) by mouth daily.      Marland Kitchen atorvastatin (LIPITOR) 80 MG tablet Take 80 mg by mouth daily.      . clopidogrel (PLAVIX) 75 MG tablet Take 1 tablet (75 mg total) by mouth daily with breakfast.  30 tablet  0  . glipiZIDE (GLUCOTROL) 5 MG tablet Take 5 mg by mouth 2 (two) times daily before a meal.      . IRON PO Take by mouth 2 (two) times daily.      Marland Kitchen L-Methylfolate-B6-B12 (METANX PO) Take 1 tablet by mouth daily.       Marland Kitchen labetalol (NORMODYNE) 300 MG tablet Take 300 mg by mouth 2 (two) times daily.      Marland Kitchen lisinopril-hydrochlorothiazide (PRINZIDE,ZESTORETIC) 10-12.5 MG per tablet Take 1 tablet by mouth daily.      . metFORMIN (GLUCOPHAGE) 500 MG tablet Take 500 mg by mouth 3 (three) times daily.      .  nitroGLYCERIN (NITROSTAT) 0.4 MG SL tablet Place 1 tablet (0.4 mg total) under the tongue every 5 (five) minutes as needed for chest pain.  25 tablet  3  . silver sulfADIAZINE (SILVADENE) 1 % cream Apply 1 application topically.       . traMADol (ULTRAM) 50 MG tablet Take 50 mg by mouth as needed.         Past Medical History  Diagnosis Date  . Hyperlipidemia   . Hypertension   . Carotid artery occlusion   . PVD (peripheral vascular disease)     Stents left SFA 2004 Dr. Samule Ohm  . Anemia   . GERD (gastroesophageal reflux disease)   . Type II diabetes mellitus   . History of blood transfusion 11/11/11  . Arthritis     "dr said I have it in my back"    Past Surgical History  Procedure Date  . Eye surgery 2002    Cataract  . Eye surgery 2009    Laser bilateral   . Coronary angioplasty with stent placement 01/19/12    "1; first one I've ever had"  . Tonsillectomy and adenoidectomy 1946  . Posterior laminectomy / decompression lumbar spine 1989  . Peripheral arterial stent graft 2003    LLE  . Cataract extraction w/ intraocular lens  implant, bilateral 2002  . Refractive  surgery 2009    bilaterally    Family History  Problem Relation Age of Onset  . Stroke Father 30  . Stroke Mother 69    History   Social History  . Marital Status: Widowed    Spouse Name: N/A    Number of Children: 1  . Years of Education: N/A   Occupational History  . Not on file.   Social History Main Topics  . Smoking status: Former Smoker -- 1.0 packs/day for 24 years    Types: Cigarettes    Quit date: 07/29/1987  . Smokeless tobacco: Never Used  . Alcohol Use: No  . Drug Use: No  . Sexually Active: No   Other Topics Concern  . Not on file   Social History Narrative   Lives alone.  Daughter lives across the street.    ROS:  As stated in the HPI and negative for all other systems.  PHYSICAL EXAM BP 149/76  Pulse 84  Ht 5\' 3"  (1.6 m)  Wt 131 lb 6.4 oz (59.603 kg)  BMI 23.28 kg/m2   SpO2 97% GENERAL:  Well appearing HEENT:  Pupils equal round and reactive, fundi not visualized, oral mucosa unremarkable NECK:  No jugular venous distention, waveform within normal limits, carotid upstroke brisk and symmetric, no bruits, no thyromegaly LYMPHATICS:  No cervical, inguinal adenopathy LUNGS:  Clear to auscultation bilaterally BACK:  No CVA tenderness CHEST:  Unremarkable HEART:  PMI not displaced or sustained,S1 and S2 within normal limits, no S3, no S4, no clicks, no rubs, soft apical early peaking systolic murmur radiating out the aortic outflow tract ABD:  Flat, positive bowel sounds normal in frequency in pitch, no bruits, no rebound, no guarding, no midline pulsatile mass, no hepatomegaly, no splenomegaly EXT:  2 plus pulses upper, absent dorsalis pedis and posterior tibialis bilateral, no edema, no cyanosis no clubbing   ASSESSMENT AND PLAN

## 2012-02-02 NOTE — Assessment & Plan Note (Signed)
I will defer to [facility] A, MD with a goal LDL less than 100 and HDL greater than 40.

## 2012-02-02 NOTE — Patient Instructions (Addendum)
Your physician recommends that you schedule a follow-up appointment in: 4 months with Dr. Antoine Poche. You will receive a reminder letter in the mail in about 1-2 months reminding you to call and schedule your appointment. If you don't receive this letter, please contact our office.  Your physician recommends that you continue on your current medications as directed. Please refer to the Current Medication list given to you today.

## 2012-02-02 NOTE — Assessment & Plan Note (Signed)
Her blood pressure is mildly elevated today but usually well controlled. No change in therapy is indicated.

## 2012-02-12 ENCOUNTER — Telehealth: Payer: Self-pay | Admitting: Vascular Surgery

## 2012-02-12 NOTE — Telephone Encounter (Addendum)
Message copied by Rosalyn Charters on Thu Feb 12, 2012 11:35 AM ------      Message from: Lamar Blinks S      Created: Thu Feb 12, 2012 10:31 AM      Regarding: appt w/ [facility]       Rec'd staff msg from [facility] last week to schedule this pt. In next 2-3 wks for preop eval prior to fem-pop/ he said it was "ok to go longer if pt's foot ok".  I called her this AM.  She said her foot is okay at this time.  Is there any chance of getting her in on 02/26/12 w/ [facility]? (anyone that could be moved out to 8/15?)   notifified patient of pre-op appt with dr. Darrick Penna on 02-19-12 9:30

## 2012-02-19 ENCOUNTER — Ambulatory Visit: Payer: Medicare Other | Admitting: Vascular Surgery

## 2012-03-02 DIAGNOSIS — K5909 Other constipation: Secondary | ICD-10-CM | POA: Diagnosis not present

## 2012-03-02 DIAGNOSIS — D649 Anemia, unspecified: Secondary | ICD-10-CM | POA: Diagnosis not present

## 2012-03-10 ENCOUNTER — Encounter: Payer: Self-pay | Admitting: Vascular Surgery

## 2012-03-11 ENCOUNTER — Other Ambulatory Visit: Payer: Self-pay | Admitting: *Deleted

## 2012-03-11 ENCOUNTER — Encounter (HOSPITAL_COMMUNITY): Payer: Self-pay | Admitting: Pharmacy Technician

## 2012-03-11 ENCOUNTER — Ambulatory Visit (INDEPENDENT_AMBULATORY_CARE_PROVIDER_SITE_OTHER): Payer: Medicare Other | Admitting: Vascular Surgery

## 2012-03-11 ENCOUNTER — Encounter: Payer: Self-pay | Admitting: Vascular Surgery

## 2012-03-11 ENCOUNTER — Encounter: Payer: Self-pay | Admitting: *Deleted

## 2012-03-11 VITALS — BP 145/50 | HR 83 | Temp 98.0°F | Resp 16 | Ht 62.0 in | Wt 130.9 lb

## 2012-03-11 DIAGNOSIS — L98499 Non-pressure chronic ulcer of skin of other sites with unspecified severity: Secondary | ICD-10-CM

## 2012-03-11 DIAGNOSIS — I7025 Atherosclerosis of native arteries of other extremities with ulceration: Secondary | ICD-10-CM | POA: Insufficient documentation

## 2012-03-11 DIAGNOSIS — I739 Peripheral vascular disease, unspecified: Secondary | ICD-10-CM

## 2012-03-11 NOTE — Progress Notes (Signed)
[facility] [place] [facility] HISTORY AND PHYSICAL   History of Present Illness:  Patient is a 78 y.o. year old female who presents for evaluation of a nonhealing toe ulcer right first toe.   The toe had a cyst which was excised and did not heal. This was in November 2012. She has no other prior episodes. Other medical problems include a history of PAD with prior left superficial femoral artery stenting by Dr. Geralynn Rile in 2004. She also has history of hyperlipidemia hypertension diabetes and a known carotid stenosis. These are all currently stable. She recently had an arteriogram which showed a right superficial femoral artery occlusion with one-vessel runoff via the peroneal. She had preoperative cardiac risk stratification by Dr. Antoine Poche. This showed a positive stress test. She was found to have a 60% LAD lesion. She also had a 95% right coronary lesion which was stented. She was given several weeks on Plavix for her stent area to heal. She returns today for further followup. She states that the toe has continued to have poor healing. She denies any fever or chills. She also had anemia previously and states that she thinks this is currently controlled. Her primary care physician is Dr. Olena Leatherwood.   Past Medical History  Diagnosis Date  . Hyperlipidemia   . Hypertension   . Carotid artery occlusion   . PVD (peripheral vascular disease)     Stents left SFA 2004 Dr. Samule Ohm  . Anemia   . GERD (gastroesophageal reflux disease)   . Type II diabetes mellitus   . History of blood transfusion 11/11/11  . Arthritis     "dr said I have it in my back"   coronary artery disease status post right coronary stenting by Dr. Antoine Poche  Past Surgical History  Procedure Date  . Eye surgery 2002    Cataract  . Eye surgery 2009    Laser bilateral   . Coronary angioplasty with stent placement 01/19/12    "1; first one I've ever had"  . Tonsillectomy and adenoidectomy 1946  . Posterior laminectomy  / decompression lumbar spine 1989  . Peripheral arterial stent graft 2003    LLE  . Cataract extraction w/ intraocular lens  implant, bilateral 2002  . Refractive surgery 2009    bilaterally     Social History History  Substance Use Topics  . Smoking status: Former Smoker -- 1.0 packs/day for 24 years    Types: Cigarettes    Quit date: 07/29/1987  . Smokeless tobacco: Never Used  . Alcohol Use: No    Family History Family History  Problem Relation Age of Onset  . Stroke Father 26  . Stroke Mother 70    Allergies  Allergies  Allergen Reactions  . Aspirin Other (See Comments)    Dr. Teena Dunk informed patient not to take this due to ulcer  . Ibuprofen Other (See Comments)    Dr. Teena Dunk informed patient not to take this due to ulcer      Current Outpatient Prescriptions  Medication Sig Dispense Refill  . atorvastatin (LIPITOR) 80 MG tablet Take 80 mg by mouth daily.      Marland Kitchen glipiZIDE (GLUCOTROL) 5 MG tablet Take 5 mg by mouth 2 (two) times daily before a meal.      . IRON PO Take by mouth 2 (two) times daily.      Marland Kitchen L-Methylfolate-B6-B12 (METANX PO) Take 1 tablet by mouth daily.       Marland Kitchen labetalol (NORMODYNE) 300 MG  tablet Take 300 mg by mouth 2 (two) times daily.      Marland Kitchen lisinopril-hydrochlorothiazide (PRINZIDE,ZESTORETIC) 10-12.5 MG per tablet Take 1 tablet by mouth daily.      . metFORMIN (GLUCOPHAGE) 500 MG tablet Take 500 mg by mouth 3 (three) times daily.      . nitroGLYCERIN (NITROSTAT) 0.4 MG SL tablet Place 1 tablet (0.4 mg total) under the tongue every 5 (five) minutes as needed for chest pain.  25 tablet  3  . silver sulfADIAZINE (SILVADENE) 1 % cream Apply 1 application topically.       . traMADol (ULTRAM) 50 MG tablet Take 50 mg by mouth as needed.       Marland Kitchen aspirin EC 81 MG EC tablet Take 1 tablet (81 mg total) by mouth daily.      . clopidogrel (PLAVIX) 75 MG tablet Take 1 tablet (75 mg total) by mouth daily with breakfast.  30 tablet  0    ROS:   General:   No weight loss, Fever, chills  HEENT: No recent headaches, no nasal bleeding, no visual changes, no sore throat  Neurologic: No dizziness, blackouts, seizures. No recent symptoms of stroke or mini- stroke. No recent episodes of slurred speech, or temporary blindness.  Cardiac: No recent episodes of chest pain/pressure, no shortness of breath at rest.  No shortness of breath with exertion.  Denies history of atrial fibrillation or irregular heartbeat  Vascular: No history of rest pain in feet.  No history of claudication.  No history of DVT   Pulmonary: No home oxygen, no productive cough, no hemoptysis,  No asthma or wheezing  Musculoskeletal:  [ ]  Arthritis, [ ]  Low back pain,  [ ]  Joint pain  Hematologic:No history of hypercoagulable state.  No history of easy bleeding.  No history of anemia  Gastrointestinal: No hematochezia or melena,  No gastroesophageal reflux, no trouble swallowing, remote history of gastric ulcer  Urinary: [ ]  chronic Kidney disease, [ ]  on HD - [ ]  MWF or [ ]  TTHS, [ ]  Burning with urination, [ ]  Frequent urination, [ ]  Difficulty urinating;   Skin: No rashes  Psychological: No history of anxiety,  No history of depression   Physical Examination  Filed Vitals:   03/11/12 0938  BP: 145/50  Pulse: 83  Temp: 98 F (36.7 C)  TempSrc: Oral  Resp: 16  Height: 5\' 2"  (1.575 m)  Weight: 130 lb 14.4 oz (59.376 kg)  SpO2: 98%    Body mass index is 23.94 kg/(m^2).  General:  Alert and oriented, no acute distress HEENT: Normal Neck: No bruit or JVD Pulmonary: Clear to auscultation bilaterally Cardiac: Regular Rate and Rhythm without murmur Abdomen: Soft, non-tender, non-distended, no mass Skin: No rash, right first toe erythema from the proximal phalanx to the distal toe. There is a 3 cm dark eschar on the lateral aspect of the first toe Extremity Pulses:  2+ radial, brachial, femoral, absent dorsalis pedis, posterior tibial pulses  bilaterally Musculoskeletal: No deformity or edema  Neurologic: Upper and lower extremity motor 5/5 and symmetric  ASSESSMENT:   Nonhealing wound right first toe which most likely has osteomyelitis. She has anatomy that would be amenable to a right femoral to below-knee popliteal bypass with one-vessel runoff via the peroneal artery. She does have known coronary artery disease but recently underwent right coronary stenting.   PLAN:  Right femoropopliteal bypass with amputation of right first toe on August 27. The patient will continue to take her aspirin.  We will restart her Plavix postoperatively. Risks benefits possible complications and procedure details were explained the patient today including but not limited to bleeding infection graft thrombosis limb loss possible need for transfusion especially if she is still anemic. She understands and agrees to proceed.  [name], MD [facility] [place] Office: [phone] Pager: [phone]   [name], MD [facility] [place] Office: [phone] Pager: [phone]

## 2012-03-17 ENCOUNTER — Encounter (HOSPITAL_COMMUNITY)
Admission: RE | Admit: 2012-03-17 | Discharge: 2012-03-17 | Disposition: A | Discharge status: Home / Self Care | Payer: Medicare Other | Source: Ambulatory Visit | Attending: Vascular Surgery | Admitting: Vascular Surgery

## 2012-03-17 ENCOUNTER — Encounter (HOSPITAL_COMMUNITY): Payer: Self-pay

## 2012-03-17 LAB — CBC
HCT: 32 % — ABNORMAL LOW (ref 36.0–46.0)
Hemoglobin: 10.4 g/dL — ABNORMAL LOW (ref 12.0–15.0)
MCH: 29.5 pg (ref 26.0–34.0)
MCHC: 32.5 g/dL (ref 30.0–36.0)
MCV: 90.9 fL (ref 78.0–100.0)
Platelets: 218 10*3/uL (ref 150–400)
RBC: 3.52 MIL/uL — ABNORMAL LOW (ref 3.87–5.11)
RDW: 14.5 % (ref 11.5–15.5)
WBC: 5.7 10*3/uL (ref 4.0–10.5)

## 2012-03-17 LAB — COMPREHENSIVE METABOLIC PANEL
ALT: 21 U/L (ref 0–35)
AST: 19 U/L (ref 0–37)
Albumin: 3.4 g/dL — ABNORMAL LOW (ref 3.5–5.2)
Alkaline Phosphatase: 64 U/L (ref 39–117)
BUN: 16 mg/dL (ref 6–23)
CO2: 27 mEq/L (ref 19–32)
Calcium: 9.4 mg/dL (ref 8.4–10.5)
Chloride: 97 mEq/L (ref 96–112)
Creatinine, Ser: 0.97 mg/dL (ref 0.50–1.10)
GFR calc Af Amer: 61 mL/min — ABNORMAL LOW (ref >90)
GFR calc non Af Amer: 53 mL/min — ABNORMAL LOW (ref >90)
Glucose, Bld: 140 mg/dL — ABNORMAL HIGH (ref 70–99)
Potassium: 4.1 mEq/L (ref 3.5–5.1)
Sodium: 135 mEq/L (ref 135–145)
Total Bilirubin: 0.2 mg/dL — ABNORMAL LOW (ref 0.3–1.2)
Total Protein: 6.4 g/dL (ref 6.0–8.3)

## 2012-03-17 LAB — ABO/RH: ABO/RH(D): A POS

## 2012-03-17 LAB — SURGICAL PCR SCREEN
MRSA, PCR: NEGATIVE
Staphylococcus aureus: POSITIVE — AB

## 2012-03-17 LAB — PROTIME-INR
INR: 1.08 (ref 0.00–1.49)
Prothrombin Time: 14.2 seconds (ref 11.6–15.2)

## 2012-03-17 LAB — APTT: aPTT: 29 seconds (ref 24–37)

## 2012-03-17 NOTE — Progress Notes (Signed)
UA needed DOS unable to obtain at PAT

## 2012-03-17 NOTE — Pre-Procedure Instructions (Signed)
20 [name]  03/17/2012   Your procedure is scheduled on:  March 23, 2012  Report to [facility] at 5:30 AM.  Call this number if you have problems the morning of surgery: [phone]   Remember:   Do not eat food:After Midnight.    Take these medicines the morning of surgery with A SIP OF WATER: labetalol, ultram (tramadol)    Aspirin & Plavix as directed   Do not wear jewelry, make-up or nail polish.  Do not wear lotions, powders, or perfumes. You may wear deodorant.  Do not shave 48 hours prior to surgery. Men may shave face and neck.  Do not bring valuables to the hospital.  Contacts, dentures or bridgework may not be worn into surgery.  Leave suitcase in the car. After surgery it may be brought to your room.  For patients admitted to the hospital, checkout time is 11:00 AM the day of discharge.   Patients discharged the day of surgery will not be allowed to drive home.  Name and phone number of your driver:   Special Instructions: CHG Shower Use Special Wash: 1/2 bottle night before surgery and 1/2 bottle morning of surgery.   Please read over the following fact sheets that you were given: Pain Booklet, Coughing and Deep Breathing, Blood Transfusion Information and Surgical Site Infection Prevention

## 2012-03-22 MED ORDER — DEXTROSE 5 % IV SOLN
1.5000 g | INTRAVENOUS | Status: AC
  Administered 2012-03-23: 1.5 g via INTRAVENOUS
  Administered 2012-03-23: .75 g via INTRAVENOUS
  Filled 2012-03-22: qty 1.5

## 2012-03-23 ENCOUNTER — Encounter (HOSPITAL_COMMUNITY)
Admission: RE | Disposition: A | Discharge status: Home Health | Payer: Self-pay | Source: Ambulatory Visit | Attending: Vascular Surgery

## 2012-03-23 ENCOUNTER — Encounter (HOSPITAL_COMMUNITY): Payer: Self-pay | Admitting: Certified Registered"

## 2012-03-23 ENCOUNTER — Encounter (HOSPITAL_COMMUNITY): Payer: Self-pay | Admitting: *Deleted

## 2012-03-23 ENCOUNTER — Inpatient Hospital Stay (HOSPITAL_COMMUNITY)
Admission: RE | Admit: 2012-03-23 | Discharge: 2012-03-29 | DRG: 253 | Disposition: A | Discharge status: Home Health | Payer: Medicare Other | Source: Ambulatory Visit | Attending: Vascular Surgery | Admitting: Vascular Surgery

## 2012-03-23 ENCOUNTER — Inpatient Hospital Stay (HOSPITAL_COMMUNITY): Payer: Medicare Other | Admitting: Certified Registered"

## 2012-03-23 DIAGNOSIS — I70209 Unspecified atherosclerosis of native arteries of extremities, unspecified extremity: Secondary | ICD-10-CM | POA: Diagnosis not present

## 2012-03-23 DIAGNOSIS — I10 Essential (primary) hypertension: Secondary | ICD-10-CM | POA: Diagnosis present

## 2012-03-23 DIAGNOSIS — M9080 Osteopathy in diseases classified elsewhere, unspecified site: Secondary | ICD-10-CM | POA: Diagnosis present

## 2012-03-23 DIAGNOSIS — M869 Osteomyelitis, unspecified: Secondary | ICD-10-CM | POA: Diagnosis not present

## 2012-03-23 DIAGNOSIS — I70269 Atherosclerosis of native arteries of extremities with gangrene, unspecified extremity: Secondary | ICD-10-CM | POA: Diagnosis not present

## 2012-03-23 DIAGNOSIS — L089 Local infection of the skin and subcutaneous tissue, unspecified: Secondary | ICD-10-CM | POA: Diagnosis not present

## 2012-03-23 DIAGNOSIS — L98499 Non-pressure chronic ulcer of skin of other sites with unspecified severity: Secondary | ICD-10-CM

## 2012-03-23 DIAGNOSIS — I70219 Atherosclerosis of native arteries of extremities with intermittent claudication, unspecified extremity: Principal | ICD-10-CM | POA: Diagnosis present

## 2012-03-23 DIAGNOSIS — D62 Acute posthemorrhagic anemia: Secondary | ICD-10-CM | POA: Diagnosis present

## 2012-03-23 DIAGNOSIS — I2510 Atherosclerotic heart disease of native coronary artery without angina pectoris: Secondary | ICD-10-CM | POA: Diagnosis present

## 2012-03-23 DIAGNOSIS — I999 Unspecified disorder of circulatory system: Secondary | ICD-10-CM | POA: Diagnosis not present

## 2012-03-23 DIAGNOSIS — I743 Embolism and thrombosis of arteries of the lower extremities: Secondary | ICD-10-CM | POA: Diagnosis not present

## 2012-03-23 DIAGNOSIS — Z87891 Personal history of nicotine dependence: Secondary | ICD-10-CM

## 2012-03-23 DIAGNOSIS — K219 Gastro-esophageal reflux disease without esophagitis: Secondary | ICD-10-CM | POA: Diagnosis present

## 2012-03-23 DIAGNOSIS — E1169 Type 2 diabetes mellitus with other specified complication: Secondary | ICD-10-CM | POA: Diagnosis present

## 2012-03-23 DIAGNOSIS — I739 Peripheral vascular disease, unspecified: Secondary | ICD-10-CM | POA: Diagnosis not present

## 2012-03-23 DIAGNOSIS — Z9861 Coronary angioplasty status: Secondary | ICD-10-CM

## 2012-03-23 DIAGNOSIS — E871 Hypo-osmolality and hyponatremia: Secondary | ICD-10-CM | POA: Diagnosis not present

## 2012-03-23 DIAGNOSIS — I6529 Occlusion and stenosis of unspecified carotid artery: Secondary | ICD-10-CM | POA: Diagnosis present

## 2012-03-23 DIAGNOSIS — L97509 Non-pressure chronic ulcer of other part of unspecified foot with unspecified severity: Secondary | ICD-10-CM | POA: Diagnosis not present

## 2012-03-23 DIAGNOSIS — E785 Hyperlipidemia, unspecified: Secondary | ICD-10-CM | POA: Diagnosis present

## 2012-03-23 HISTORY — PX: AMPUTATION: SHX166

## 2012-03-23 HISTORY — PX: FEMORAL-POPLITEAL BYPASS GRAFT: SHX937

## 2012-03-23 HISTORY — PX: FEMORAL ENDARTERECTOMY: SUR606

## 2012-03-23 LAB — URINE MICROSCOPIC-ADD ON

## 2012-03-23 LAB — URINALYSIS, ROUTINE W REFLEX MICROSCOPIC
Bilirubin Urine: NEGATIVE
Glucose, UA: NEGATIVE mg/dL
Hgb urine dipstick: NEGATIVE
Ketones, ur: NEGATIVE mg/dL
Nitrite: NEGATIVE
Protein, ur: NEGATIVE mg/dL
Specific Gravity, Urine: 1.016 (ref 1.005–1.030)
Urobilinogen, UA: 1 mg/dL (ref 0.0–1.0)
pH: 6 (ref 5.0–8.0)

## 2012-03-23 LAB — GLUCOSE, CAPILLARY
Glucose-Capillary: 172 mg/dL — ABNORMAL HIGH (ref 70–99)
Glucose-Capillary: 220 mg/dL — ABNORMAL HIGH (ref 70–99)
Glucose-Capillary: 214 mg/dL — ABNORMAL HIGH (ref 70–99)

## 2012-03-23 SURGERY — BYPASS GRAFT FEMORAL-POPLITEAL ARTERY
Anesthesia: General | Site: Toe | Laterality: Right | Wound class: Clean

## 2012-03-23 MED ORDER — OXYCODONE-ACETAMINOPHEN 5-325 MG PO TABS
1.0000 | ORAL_TABLET | ORAL | Status: DC | PRN
  Administered 2012-03-23 – 2012-03-24 (×6): 1 via ORAL
  Administered 2012-03-25: 2 via ORAL
  Administered 2012-03-25 (×2): 1 via ORAL
  Administered 2012-03-26: 2 via ORAL
  Administered 2012-03-26 – 2012-03-28 (×5): 1 via ORAL
  Filled 2012-03-23 (×8): qty 1
  Filled 2012-03-23: qty 2
  Filled 2012-03-23 (×4): qty 1
  Filled 2012-03-23: qty 2
  Filled 2012-03-23: qty 1

## 2012-03-23 MED ORDER — ATORVASTATIN CALCIUM 80 MG PO TABS
80.0000 mg | ORAL_TABLET | Freq: Every day | ORAL | Status: DC
  Administered 2012-03-23 – 2012-03-28 (×5): 80 mg via ORAL
  Filled 2012-03-23 (×7): qty 1

## 2012-03-23 MED ORDER — LISINOPRIL-HYDROCHLOROTHIAZIDE 10-12.5 MG PO TABS: 1.0000 | ORAL_TABLET | Freq: Every day | ORAL | Status: DC

## 2012-03-23 MED ORDER — DEXAMETHASONE SODIUM PHOSPHATE 4 MG/ML IJ SOLN
INTRAMUSCULAR | Status: DC | PRN
  Administered 2012-03-23: 4 mg via INTRAVENOUS

## 2012-03-23 MED ORDER — PANTOPRAZOLE SODIUM 40 MG PO TBEC
40.0000 mg | DELAYED_RELEASE_TABLET | Freq: Every day | ORAL | Status: DC
  Administered 2012-03-24 – 2012-03-28 (×5): 40 mg via ORAL
  Filled 2012-03-23 (×6): qty 1

## 2012-03-23 MED ORDER — ASPIRIN EC 81 MG PO TBEC
81.0000 mg | DELAYED_RELEASE_TABLET | Freq: Every day | ORAL | Status: DC
  Administered 2012-03-24 – 2012-03-29 (×6): 81 mg via ORAL
  Filled 2012-03-23 (×6): qty 1

## 2012-03-23 MED ORDER — BISACODYL 10 MG RE SUPP
10.0000 mg | Freq: Every day | RECTAL | Status: DC | PRN
  Administered 2012-03-27: 10 mg via RECTAL
  Filled 2012-03-23 (×2): qty 1

## 2012-03-23 MED ORDER — PHENOL 1.4 % MT LIQD: 1.0000 | OROMUCOSAL | Status: DC | PRN

## 2012-03-23 MED ORDER — POTASSIUM CHLORIDE IN NACL 20-0.9 MEQ/L-% IV SOLN
INTRAVENOUS | Status: DC
  Administered 2012-03-23: 16:00:00 via INTRAVENOUS
  Filled 2012-03-23 (×4): qty 1000

## 2012-03-23 MED ORDER — GLIPIZIDE 5 MG PO TABS
5.0000 mg | ORAL_TABLET | Freq: Two times a day (BID) | ORAL | Status: DC
  Filled 2012-03-23 (×2): qty 1

## 2012-03-23 MED ORDER — SUFENTANIL CITRATE 50 MCG/ML IV SOLN
INTRAVENOUS | Status: DC | PRN
  Administered 2012-03-23 (×5): 10 ug via INTRAVENOUS

## 2012-03-23 MED ORDER — PHENYLEPHRINE HCL 10 MG/ML IJ SOLN
INTRAMUSCULAR | Status: DC | PRN
  Administered 2012-03-23 (×3): 80 ug via INTRAVENOUS

## 2012-03-23 MED ORDER — THROMBIN 20000 UNITS EX SOLR
CUTANEOUS | Status: DC | PRN
  Administered 2012-03-23: 14:00:00 via TOPICAL

## 2012-03-23 MED ORDER — PROPOFOL 10 MG/ML IV EMUL
INTRAVENOUS | Status: DC | PRN
  Administered 2012-03-23: 170 mg via INTRAVENOUS

## 2012-03-23 MED ORDER — DROPERIDOL 2.5 MG/ML IJ SOLN
INTRAMUSCULAR | Status: DC | PRN
  Administered 2012-03-23: 0.625 mg via INTRAVENOUS

## 2012-03-23 MED ORDER — DOCUSATE SODIUM 100 MG PO CAPS
100.0000 mg | ORAL_CAPSULE | Freq: Every day | ORAL | Status: DC
  Administered 2012-03-24 – 2012-03-29 (×6): 100 mg via ORAL
  Filled 2012-03-23 (×6): qty 1

## 2012-03-23 MED ORDER — HYDROMORPHONE HCL PF 1 MG/ML IJ SOLN: 0.2500 mg | INTRAMUSCULAR | Status: DC | PRN

## 2012-03-23 MED ORDER — POTASSIUM CHLORIDE CRYS ER 20 MEQ PO TBCR: 20.0000 meq | EXTENDED_RELEASE_TABLET | Freq: Once | ORAL | Status: AC | PRN

## 2012-03-23 MED ORDER — METANX 3-35-2 MG PO TABS
1.0000 | ORAL_TABLET | Freq: Every day | ORAL | Status: DC
  Administered 2012-03-24 – 2012-03-29 (×6): 1 via ORAL
  Filled 2012-03-23 (×6): qty 1

## 2012-03-23 MED ORDER — ONDANSETRON HCL 4 MG/2ML IJ SOLN
INTRAMUSCULAR | Status: DC | PRN
  Administered 2012-03-23: 4 mg via INTRAVENOUS

## 2012-03-23 MED ORDER — THROMBIN 20000 UNITS EX SOLR
CUTANEOUS | Status: AC
  Filled 2012-03-23: qty 20000

## 2012-03-23 MED ORDER — PROPOFOL 10 MG/ML IV BOLUS
INTRAVENOUS | Status: DC | PRN
  Administered 2012-03-23: 30 mg via INTRAVENOUS
  Administered 2012-03-23: 170 mg via INTRAVENOUS

## 2012-03-23 MED ORDER — PROTAMINE SULFATE 10 MG/ML IV SOLN
INTRAVENOUS | Status: DC | PRN
  Administered 2012-03-23: 50 mg via INTRAVENOUS

## 2012-03-23 MED ORDER — METOPROLOL TARTRATE 1 MG/ML IV SOLN: 2.0000 mg | INTRAVENOUS | Status: DC | PRN

## 2012-03-23 MED ORDER — DEXTROSE 5 % IV SOLN
1.5000 g | Freq: Two times a day (BID) | INTRAVENOUS | Status: AC
  Administered 2012-03-24 (×2): 1.5 g via INTRAVENOUS
  Filled 2012-03-23 (×2): qty 1.5

## 2012-03-23 MED ORDER — CEFUROXIME SODIUM 750 MG IJ SOLR
INTRAMUSCULAR | Status: AC
  Filled 2012-03-23: qty 750

## 2012-03-23 MED ORDER — HYDROCHLOROTHIAZIDE 12.5 MG PO CAPS
12.5000 mg | ORAL_CAPSULE | Freq: Every day | ORAL | Status: DC
  Administered 2012-03-24 – 2012-03-28 (×5): 12.5 mg via ORAL
  Filled 2012-03-23 (×6): qty 1

## 2012-03-23 MED ORDER — INSULIN ASPART 100 UNIT/ML ~~LOC~~ SOLN
0.0000 [IU] | Freq: Three times a day (TID) | SUBCUTANEOUS | Status: DC
  Administered 2012-03-23 – 2012-03-25 (×3): 5 [IU] via SUBCUTANEOUS
  Administered 2012-03-25: 3 [IU] via SUBCUTANEOUS
  Administered 2012-03-26: 5 [IU] via SUBCUTANEOUS
  Administered 2012-03-26: 3 [IU] via SUBCUTANEOUS
  Administered 2012-03-26 – 2012-03-27 (×2): 2 [IU] via SUBCUTANEOUS
  Administered 2012-03-27: 3 [IU] via SUBCUTANEOUS
  Administered 2012-03-28: 2 [IU] via SUBCUTANEOUS
  Administered 2012-03-28 – 2012-03-29 (×2): 3 [IU] via SUBCUTANEOUS

## 2012-03-23 MED ORDER — SODIUM CHLORIDE 0.9 % IV SOLN: INTRAVENOUS | Status: DC

## 2012-03-23 MED ORDER — 0.9 % SODIUM CHLORIDE (POUR BTL) OPTIME
TOPICAL | Status: DC | PRN
  Administered 2012-03-23: 2000 mL

## 2012-03-23 MED ORDER — HYDRALAZINE HCL 20 MG/ML IJ SOLN
10.0000 mg | INTRAMUSCULAR | Status: DC | PRN
  Filled 2012-03-23: qty 0.5

## 2012-03-23 MED ORDER — ONDANSETRON HCL 4 MG/2ML IJ SOLN: 4.0000 mg | Freq: Once | INTRAMUSCULAR | Status: DC | PRN

## 2012-03-23 MED ORDER — LABETALOL HCL 300 MG PO TABS
300.0000 mg | ORAL_TABLET | Freq: Two times a day (BID) | ORAL | Status: DC
  Administered 2012-03-23 – 2012-03-29 (×12): 300 mg via ORAL
  Filled 2012-03-23 (×14): qty 1

## 2012-03-23 MED ORDER — MUPIROCIN 2 % EX OINT
TOPICAL_OINTMENT | CUTANEOUS | Status: AC
  Administered 2012-03-23: 1 via NASAL
  Filled 2012-03-23: qty 22

## 2012-03-23 MED ORDER — SODIUM CHLORIDE 0.9 % IV SOLN: 500.0000 mL | Freq: Once | INTRAVENOUS | Status: AC | PRN

## 2012-03-23 MED ORDER — ONDANSETRON HCL 4 MG/2ML IJ SOLN
4.0000 mg | Freq: Four times a day (QID) | INTRAMUSCULAR | Status: DC | PRN
  Administered 2012-03-25: 4 mg via INTRAVENOUS
  Filled 2012-03-23: qty 2

## 2012-03-23 MED ORDER — METFORMIN HCL 500 MG PO TABS
500.0000 mg | ORAL_TABLET | Freq: Three times a day (TID) | ORAL | Status: DC
  Administered 2012-03-23 – 2012-03-29 (×17): 500 mg via ORAL
  Filled 2012-03-23 (×20): qty 1

## 2012-03-23 MED ORDER — HYDROMORPHONE HCL PF 1 MG/ML IJ SOLN
0.5000 mg | INTRAMUSCULAR | Status: DC | PRN
Start: 2012-03-23 — End: 2012-03-29
  Administered 2012-03-24: 0.5 mg via INTRAVENOUS
  Filled 2012-03-23: qty 1

## 2012-03-23 MED ORDER — LISINOPRIL 10 MG PO TABS
10.0000 mg | ORAL_TABLET | Freq: Every day | ORAL | Status: DC
  Administered 2012-03-24 – 2012-03-29 (×6): 10 mg via ORAL
  Filled 2012-03-23 (×6): qty 1

## 2012-03-23 MED ORDER — POLYETHYLENE GLYCOL 3350 17 G PO PACK
17.0000 g | PACK | Freq: Every day | ORAL | Status: DC | PRN
  Filled 2012-03-23: qty 1

## 2012-03-23 MED ORDER — ACETAMINOPHEN 650 MG RE SUPP: 325.0000 mg | RECTAL | Status: DC | PRN

## 2012-03-23 MED ORDER — GLIPIZIDE 5 MG PO TABS
5.0000 mg | ORAL_TABLET | Freq: Two times a day (BID) | ORAL | Status: DC
  Filled 2012-03-23: qty 1

## 2012-03-23 MED ORDER — HEPARIN SODIUM (PORCINE) 1000 UNIT/ML IJ SOLN
INTRAMUSCULAR | Status: DC | PRN
  Administered 2012-03-23: 5000 [IU] via INTRAVENOUS
  Administered 2012-03-23: 3000 [IU] via INTRAVENOUS
  Administered 2012-03-23: 7000 [IU] via INTRAVENOUS

## 2012-03-23 MED ORDER — CLOPIDOGREL BISULFATE 75 MG PO TABS
75.0000 mg | ORAL_TABLET | Freq: Every day | ORAL | Status: DC
  Administered 2012-03-24 – 2012-03-29 (×6): 75 mg via ORAL
  Filled 2012-03-23 (×8): qty 1

## 2012-03-23 MED ORDER — GUAIFENESIN-DM 100-10 MG/5ML PO SYRP: 15.0000 mL | ORAL_SOLUTION | ORAL | Status: DC | PRN

## 2012-03-23 MED ORDER — LACTATED RINGERS IV SOLN
INTRAVENOUS | Status: DC | PRN
  Administered 2012-03-23 (×4): via INTRAVENOUS

## 2012-03-23 MED ORDER — MUPIROCIN 2 % EX OINT
TOPICAL_OINTMENT | Freq: Two times a day (BID) | CUTANEOUS | Status: AC
  Administered 2012-03-23 (×2): 1 via NASAL
  Administered 2012-03-24 – 2012-03-25 (×4): via NASAL
  Administered 2012-03-26: 1 via NASAL
  Administered 2012-03-26 – 2012-03-27 (×3): via NASAL
  Filled 2012-03-23: qty 22

## 2012-03-23 MED ORDER — LIDOCAINE HCL (CARDIAC) 20 MG/ML IV SOLN
INTRAVENOUS | Status: DC | PRN
  Administered 2012-03-23: 60 mg via INTRAVENOUS

## 2012-03-23 MED ORDER — SODIUM CHLORIDE 0.9 % IR SOLN
Status: DC | PRN
  Administered 2012-03-23: 09:00:00

## 2012-03-23 MED ORDER — NITROGLYCERIN 0.4 MG SL SUBL
0.4000 mg | SUBLINGUAL_TABLET | SUBLINGUAL | Status: DC | PRN
Start: 2012-03-23 — End: 2012-03-29

## 2012-03-23 MED ORDER — ACETAMINOPHEN 325 MG PO TABS: 325.0000 mg | ORAL_TABLET | ORAL | Status: DC | PRN

## 2012-03-23 MED ORDER — LIDOCAINE HCL 4 % MT SOLN
OROMUCOSAL | Status: DC | PRN
  Administered 2012-03-23: 4 mL via TOPICAL

## 2012-03-23 MED ORDER — ALUM & MAG HYDROXIDE-SIMETH 200-200-20 MG/5ML PO SUSP
15.0000 mL | ORAL | Status: DC | PRN
  Filled 2012-03-23: qty 30

## 2012-03-23 MED ORDER — ROCURONIUM BROMIDE 100 MG/10ML IV SOLN
INTRAVENOUS | Status: DC | PRN
  Administered 2012-03-23: 50 mg via INTRAVENOUS

## 2012-03-23 MED ORDER — GLIPIZIDE 5 MG PO TABS
5.0000 mg | ORAL_TABLET | Freq: Two times a day (BID) | ORAL | Status: DC
  Administered 2012-03-24 – 2012-03-29 (×10): 5 mg via ORAL
  Filled 2012-03-23 (×13): qty 1

## 2012-03-23 MED ORDER — LABETALOL HCL 5 MG/ML IV SOLN
10.0000 mg | INTRAVENOUS | Status: DC | PRN
  Filled 2012-03-23 (×2): qty 4

## 2012-03-23 SURGICAL SUPPLY — 84 items
ADH SKN CLS APL DERMABOND .7 (GAUZE/BANDAGES/DRESSINGS) ×3
ADH SKN CLS LQ APL DERMABOND (GAUZE/BANDAGES/DRESSINGS) ×3
BANDAGE ELASTIC 4 VELCRO ST LF (GAUZE/BANDAGES/DRESSINGS) ×4 IMPLANT
BANDAGE ESMARK 6X9 LF (GAUZE/BANDAGES/DRESSINGS) IMPLANT
BANDAGE GAUZE ELAST BULKY 4 IN (GAUZE/BANDAGES/DRESSINGS) ×4 IMPLANT
BNDG CMPR 9X6 STRL LF SNTH (GAUZE/BANDAGES/DRESSINGS)
BNDG ESMARK 6X9 LF (GAUZE/BANDAGES/DRESSINGS)
CANISTER SUCTION 2500CC (MISCELLANEOUS) ×4 IMPLANT
CLIP TI MEDIUM 24 (CLIP) ×4 IMPLANT
CLIP TI WIDE RED SMALL 24 (CLIP) ×5 IMPLANT
CLOTH BEACON ORANGE TIMEOUT ST (SAFETY) ×4 IMPLANT
COVER SURGICAL LIGHT HANDLE (MISCELLANEOUS) ×4 IMPLANT
CUFF TOURNIQUET SINGLE 24IN (TOURNIQUET CUFF) IMPLANT
CUFF TOURNIQUET SINGLE 34IN LL (TOURNIQUET CUFF) IMPLANT
CUFF TOURNIQUET SINGLE 44IN (TOURNIQUET CUFF) IMPLANT
DECANTER SPIKE VIAL GLASS SM (MISCELLANEOUS) IMPLANT
DERMABOND ADHESIVE PROPEN (GAUZE/BANDAGES/DRESSINGS) ×1
DERMABOND ADVANCED (GAUZE/BANDAGES/DRESSINGS) ×1
DERMABOND ADVANCED .7 DNX12 (GAUZE/BANDAGES/DRESSINGS) IMPLANT
DERMABOND ADVANCED .7 DNX6 (GAUZE/BANDAGES/DRESSINGS) IMPLANT
DRAIN SNY WOU (WOUND CARE) IMPLANT
DRAPE EXTREMITY T 121X128X90 (DRAPE) ×3 IMPLANT
DRAPE WARM FLUID 44X44 (DRAPE) ×4 IMPLANT
DRAPE X-RAY CASS 24X20 (DRAPES) IMPLANT
DRSG COVADERM 4X10 (GAUZE/BANDAGES/DRESSINGS) ×1 IMPLANT
DRSG COVADERM 4X14 (GAUZE/BANDAGES/DRESSINGS) ×2 IMPLANT
DRSG COVADERM 4X8 (GAUZE/BANDAGES/DRESSINGS) IMPLANT
DRSG EMULSION OIL 3X3 NADH (GAUZE/BANDAGES/DRESSINGS) ×4 IMPLANT
ELECT CAUTERY BLADE 6.4 (BLADE) ×1 IMPLANT
ELECT REM PT RETURN 9FT ADLT (ELECTROSURGICAL) ×8
ELECTRODE REM PT RTRN 9FT ADLT (ELECTROSURGICAL) ×3 IMPLANT
EVACUATOR SILICONE 100CC (DRAIN) IMPLANT
GAUZE SPONGE 4X4 16PLY XRAY LF (GAUZE/BANDAGES/DRESSINGS) ×2 IMPLANT
GLOVE BIO SURGEON STRL SZ 6.5 (GLOVE) ×1 IMPLANT
GLOVE BIO SURGEON STRL SZ7.5 (GLOVE) ×4 IMPLANT
GLOVE BIOGEL PI IND STRL 6.5 (GLOVE) IMPLANT
GLOVE BIOGEL PI IND STRL 7.0 (GLOVE) IMPLANT
GLOVE BIOGEL PI IND STRL 7.5 (GLOVE) IMPLANT
GLOVE BIOGEL PI INDICATOR 6.5 (GLOVE) ×3
GLOVE BIOGEL PI INDICATOR 7.0 (GLOVE) ×2
GLOVE BIOGEL PI INDICATOR 7.5 (GLOVE) ×4
GLOVE SURG SS PI 7.0 STRL IVOR (GLOVE) ×1 IMPLANT
GLOVE SURG SS PI 7.5 STRL IVOR (GLOVE) ×4 IMPLANT
GOWN PREVENTION PLUS XLARGE (GOWN DISPOSABLE) ×7 IMPLANT
GOWN STRL NON-REIN LRG LVL3 (GOWN DISPOSABLE) ×7 IMPLANT
KIT BASIN OR (CUSTOM PROCEDURE TRAY) ×4 IMPLANT
KIT ROOM TURNOVER OR (KITS) ×4 IMPLANT
LOOP VESSEL MAXI BLUE (MISCELLANEOUS) ×1 IMPLANT
LOOP VESSEL MINI RED (MISCELLANEOUS) ×1 IMPLANT
MARKER GRAFT CORONARY BYPASS (MISCELLANEOUS) IMPLANT
NS IRRIG 1000ML POUR BTL (IV SOLUTION) ×8 IMPLANT
PACK GENERAL/GYN (CUSTOM PROCEDURE TRAY) ×3 IMPLANT
PACK PERIPHERAL VASCULAR (CUSTOM PROCEDURE TRAY) ×4 IMPLANT
PAD ARMBOARD 7.5X6 YLW CONV (MISCELLANEOUS) ×8 IMPLANT
PADDING CAST COTTON 6X4 STRL (CAST SUPPLIES) IMPLANT
PENCIL BUTTON HOLSTER BLD 10FT (ELECTRODE) ×1 IMPLANT
SET COLLECT BLD 21X3/4 12 (NEEDLE) IMPLANT
SPECIMEN JAR SMALL (MISCELLANEOUS) ×4 IMPLANT
SPONGE GAUZE 4X4 12PLY (GAUZE/BANDAGES/DRESSINGS) ×3 IMPLANT
SPONGE SURGIFOAM ABS GEL 100 (HEMOSTASIS) IMPLANT
STAPLER VISISTAT 35W (STAPLE) ×2 IMPLANT
STOPCOCK 4 WAY LG BORE MALE ST (IV SETS) IMPLANT
SUT ETHILON 3 0 PS 1 (SUTURE) ×4 IMPLANT
SUT PROLENE 5 0 C 1 24 (SUTURE) ×4 IMPLANT
SUT PROLENE 5 0 C1 (SUTURE) ×1 IMPLANT
SUT PROLENE 6 0 CC (SUTURE) ×9 IMPLANT
SUT PROLENE 7 0 BV 1 (SUTURE) ×2 IMPLANT
SUT PROLENE 7 0 BV1 MDA (SUTURE) ×1 IMPLANT
SUT SILK 2 0 SH (SUTURE) ×5 IMPLANT
SUT SILK 3 0 (SUTURE) ×8
SUT SILK 3-0 18XBRD TIE 12 (SUTURE) IMPLANT
SUT VIC AB 2-0 CTX 36 (SUTURE) ×8 IMPLANT
SUT VIC AB 3-0 SH 27 (SUTURE) ×16
SUT VIC AB 3-0 SH 27X BRD (SUTURE) ×6 IMPLANT
SUT VIC AB 4-0 PS2 27 (SUTURE) ×1 IMPLANT
SWAB COLLECTION DEVICE MRSA (MISCELLANEOUS) IMPLANT
TAPE UMBILICAL COTTON 1/8X30 (MISCELLANEOUS) ×1 IMPLANT
TOWEL OR 17X24 6PK STRL BLUE (TOWEL DISPOSABLE) ×8 IMPLANT
TOWEL OR 17X26 10 PK STRL BLUE (TOWEL DISPOSABLE) ×8 IMPLANT
TRAY FOLEY CATH 14FRSI W/METER (CATHETERS) ×4 IMPLANT
TUBE ANAEROBIC SPECIMEN COL (MISCELLANEOUS) IMPLANT
TUBING EXTENTION W/L.L. (IV SETS) IMPLANT
UNDERPAD 30X30 INCONTINENT (UNDERPADS AND DIAPERS) ×4 IMPLANT
WATER STERILE IRR 1000ML POUR (IV SOLUTION) ×4 IMPLANT

## 2012-03-23 NOTE — H&P (View-Only) (Signed)
[facility] [place] HISTORY AND PHYSICAL   History of Present Illness:  Patient is a 78 y.o. year old female who presents for evaluation of a nonhealing toe ulcer right first toe.   The toe had a cyst which was excised and did not heal. This was in November 2012. She has no other prior episodes. Other medical problems include a history of PAD with prior left superficial femoral artery stenting by Dr. Bill Downey in 2004. She also has history of hyperlipidemia hypertension diabetes and a known carotid stenosis. These are all currently stable. She recently had an arteriogram which showed a right superficial femoral artery occlusion with one-vessel runoff via the peroneal. She had preoperative cardiac risk stratification by Dr. Hochrein. This showed a positive stress test. She was found to have a 60% LAD lesion. She also had a 95% right coronary lesion which was stented. She was given several weeks on Plavix for her stent area to heal. She returns today for further followup. She states that the toe has continued to have poor healing. She denies any fever or chills. She also had anemia previously and states that she thinks this is currently controlled. Her primary care physician is Dr. Hasanaj.   Past Medical History  Diagnosis Date  . Hyperlipidemia   . Hypertension   . Carotid artery occlusion   . PVD (peripheral vascular disease)     Stents left SFA 2004 Dr. Downey  . Anemia   . GERD (gastroesophageal reflux disease)   . Type II diabetes mellitus   . History of blood transfusion 11/11/11  . Arthritis     "dr said I have it in my back"   coronary artery disease status post right coronary stenting by Dr. Hochrein  Past Surgical History  Procedure Date  . Eye surgery 2002    Cataract  . Eye surgery 2009    Laser bilateral   . Coronary angioplasty with stent placement 01/19/12    "1; first one I've ever had"  . Tonsillectomy and adenoidectomy 1946  . Posterior laminectomy  / decompression lumbar spine 1989  . Peripheral arterial stent graft 2003    LLE  . Cataract extraction w/ intraocular lens  implant, bilateral 2002  . Refractive surgery 2009    bilaterally     Social History History  Substance Use Topics  . Smoking status: Former Smoker -- 1.0 packs/day for 24 years    Types: Cigarettes    Quit date: 07/29/1987  . Smokeless tobacco: Never Used  . Alcohol Use: No    Family History Family History  Problem Relation Age of Onset  . Stroke Father 70  . Stroke Mother 91    Allergies  Allergies  Allergen Reactions  . Aspirin Other (See Comments)    Dr. Benson informed patient not to take this due to ulcer  . Ibuprofen Other (See Comments)    Dr. Benson informed patient not to take this due to ulcer      Current Outpatient Prescriptions  Medication Sig Dispense Refill  . atorvastatin (LIPITOR) 80 MG tablet Take 80 mg by mouth daily.      . glipiZIDE (GLUCOTROL) 5 MG tablet Take 5 mg by mouth 2 (two) times daily before a meal.      . IRON PO Take by mouth 2 (two) times daily.      . L-Methylfolate-B6-B12 (METANX PO) Take 1 tablet by mouth daily.       . labetalol (NORMODYNE) 300 MG   tablet Take 300 mg by mouth 2 (two) times daily.      . lisinopril-hydrochlorothiazide (PRINZIDE,ZESTORETIC) 10-12.5 MG per tablet Take 1 tablet by mouth daily.      . metFORMIN (GLUCOPHAGE) 500 MG tablet Take 500 mg by mouth 3 (three) times daily.      . nitroGLYCERIN (NITROSTAT) 0.4 MG SL tablet Place 1 tablet (0.4 mg total) under the tongue every 5 (five) minutes as needed for chest pain.  25 tablet  3  . silver sulfADIAZINE (SILVADENE) 1 % cream Apply 1 application topically.       . traMADol (ULTRAM) 50 MG tablet Take 50 mg by mouth as needed.       . aspirin EC 81 MG EC tablet Take 1 tablet (81 mg total) by mouth daily.      . clopidogrel (PLAVIX) 75 MG tablet Take 1 tablet (75 mg total) by mouth daily with breakfast.  30 tablet  0    ROS:   General:   No weight loss, Fever, chills  HEENT: No recent headaches, no nasal bleeding, no visual changes, no sore throat  Neurologic: No dizziness, blackouts, seizures. No recent symptoms of stroke or mini- stroke. No recent episodes of slurred speech, or temporary blindness.  Cardiac: No recent episodes of chest pain/pressure, no shortness of breath at rest.  No shortness of breath with exertion.  Denies history of atrial fibrillation or irregular heartbeat  Vascular: No history of rest pain in feet.  No history of claudication.  No history of DVT   Pulmonary: No home oxygen, no productive cough, no hemoptysis,  No asthma or wheezing  Musculoskeletal:  [ ] Arthritis, [ ] Low back pain,  [ ] Joint pain  Hematologic:No history of hypercoagulable state.  No history of easy bleeding.  No history of anemia  Gastrointestinal: No hematochezia or melena,  No gastroesophageal reflux, no trouble swallowing, remote history of gastric ulcer  Urinary: [ ] chronic Kidney disease, [ ] on HD - [ ] MWF or [ ] TTHS, [ ] Burning with urination, [ ] Frequent urination, [ ] Difficulty urinating;   Skin: No rashes  Psychological: No history of anxiety,  No history of depression   Physical Examination  Filed Vitals:   03/11/12 0938  BP: 145/50  Pulse: 83  Temp: 98 F (36.7 C)  TempSrc: Oral  Resp: 16  Height: 5' 2" (1.575 m)  Weight: 130 lb 14.4 oz (59.376 kg)  SpO2: 98%    Body mass index is 23.94 kg/(m^2).  General:  Alert and oriented, no acute distress HEENT: Normal Neck: No bruit or JVD Pulmonary: Clear to auscultation bilaterally Cardiac: Regular Rate and Rhythm without murmur Abdomen: Soft, non-tender, non-distended, no mass Skin: No rash, right first toe erythema from the proximal phalanx to the distal toe. There is a 3 cm dark eschar on the lateral aspect of the first toe Extremity Pulses:  2+ radial, brachial, femoral, absent dorsalis pedis, posterior tibial pulses  bilaterally Musculoskeletal: No deformity or edema  Neurologic: Upper and lower extremity motor 5/5 and symmetric  ASSESSMENT:   Nonhealing wound right first toe which most likely has osteomyelitis. She has anatomy that would be amenable to a right femoral to below-knee popliteal bypass with one-vessel runoff via the peroneal artery. She does have known coronary artery disease but recently underwent right coronary stenting.   PLAN:  Right femoropopliteal bypass with amputation of right first toe on August 27. The patient will continue to take her aspirin.   We will restart her Plavix postoperatively. Risks benefits possible complications and procedure details were explained the patient today including but not limited to bleeding infection graft thrombosis limb loss possible need for transfusion especially if she is still anemic. She understands and agrees to proceed.  Alenna Russell, MD [facility] [place] Office: [phone] Pager: [phone]   Brenlee Koskela, MD [facility] [place] Office: [phone] Pager: [phone]  

## 2012-03-23 NOTE — Progress Notes (Signed)
Utilization review completed.  

## 2012-03-23 NOTE — Interval H&P Note (Signed)
History and Physical Interval Note:  03/23/2012 7:41 AM  Cynthia Ray  has presented today for surgery, with the diagnosis of CLAUDICATION  The various methods of treatment have been discussed with the patient and family. After consideration of risks, benefits and other options for treatment, the patient has consented to  Procedure(s) (LRB): BYPASS GRAFT FEMORAL-POPLITEAL ARTERY (Right) AMPUTATION DIGIT (Right) as a surgical intervention .  The patient's history has been reviewed, patient examined, no change in status, stable for surgery.  I have reviewed the patient's chart and labs.  Questions were answered to the patient's satisfaction.     FIELDS,CHARLES E

## 2012-03-23 NOTE — Anesthesia Postprocedure Evaluation (Signed)
  Anesthesia Post-op Note  Patient: Cynthia Ray  Procedure(s) Performed: Procedure(s) (LRB): BYPASS GRAFT FEMORAL-POPLITEAL ARTERY (Right) AMPUTATION DIGIT (Right) ENDARTERECTOMY FEMORAL (Right)  Patient Location: PACU  Anesthesia Type: General  Level of Consciousness: awake, alert , oriented and patient cooperative  Airway and Oxygen Therapy: Patient Spontanous Breathing and Patient connected to nasal cannula oxygen  Post-op Pain: mild  Post-op Assessment: Post-op Vital signs reviewed, Patient's Cardiovascular Status Stable, Respiratory Function Stable, Patent Airway, No signs of Nausea or vomiting and Pain level controlled  Post-op Vital Signs: stable  Complications: No apparent anesthesia complications

## 2012-03-23 NOTE — Anesthesia Preprocedure Evaluation (Addendum)
Anesthesia Evaluation  Patient identified by MRN, date of birth, ID band Patient awake    Reviewed: Allergy & Precautions, H&P , NPO status   Airway Mallampati: I TM Distance: >3 FB Neck ROM: full    Dental   Pulmonary Current Smoker,          Cardiovascular hypertension, + CAD and + Peripheral Vascular Disease Rhythm:regular Rate:Normal     Neuro/Psych    GI/Hepatic GERD-  ,  Endo/Other  Type 2, Oral Hypoglycemic Agents  Renal/GU      Musculoskeletal   Abdominal   Peds  Hematology   Anesthesia Other Findings   Reproductive/Obstetrics                          Anesthesia Physical Anesthesia Plan  ASA: III  Anesthesia Plan: General   Post-op Pain Management:    Induction: Intravenous  Airway Management Planned: Oral ETT  Additional Equipment:   Intra-op Plan:   Post-operative Plan: Extubation in OR  Informed Consent: I have reviewed the patients History and Physical, chart, labs and discussed the procedure including the risks, benefits and alternatives for the proposed anesthesia with the patient or authorized representative who has indicated his/her understanding and acceptance.     Plan Discussed with: CRNA, Anesthesiologist and Surgeon  Anesthesia Plan Comments:         Anesthesia Quick Evaluation

## 2012-03-23 NOTE — Transfer of Care (Signed)
Immediate Anesthesia Transfer of Care Note  Patient: Cynthia Ray  Procedure(s) Performed: Procedure(s) (LRB): BYPASS GRAFT FEMORAL-POPLITEAL ARTERY (Right) AMPUTATION DIGIT (Right) ENDARTERECTOMY FEMORAL (Right)  Patient Location: PACU  Anesthesia Type: General  Level of Consciousness: oriented, sedated, patient cooperative and responds to stimulation  Airway & Oxygen Therapy: Patient Spontanous Breathing and Patient connected to nasal cannula oxygen  Post-op Assessment: Report given to PACU RN, Post -op Vital signs reviewed and stable and Patient moving all extremities  Post vital signs: Reviewed and stable  Complications: No apparent anesthesia complications

## 2012-03-23 NOTE — Op Note (Signed)
Procedure: Right femoral to below knee popliteal bypass with non-reversed ipsilateral great saphenous vein, right femoral endarterectomy with profundaplasty, right first toe amputation  Preoperative diagnosis: Osteomyelitis right first toe  Postoperative diagnosis: Same  Anesthesia: General  Asst.: Della Goo, PA-C  Operative findings:     Calcified vessels, good quality saphenous vein 3-4 mm  Operative details: After obtaining informed consent, the patient was taken to the operating room. The patient was placed in supine position on the operating room table. After induction of general anesthesia and endotracheal intubation, a Foley catheter was placed. Next, the patient's entire right lower extremity was prepped and draped in the usual sterile fashion. A longitudinal incision was then made in the right groin and carried down through the subcutaneous tissues to expose the right common femoral artery.  This was severely calcified but was softer above the circumflex iliac branches.  The was dense scar at the femoral bifurcation probably from prior catheterization.  The profunda femoris and superficial femoral arteries were dissected free circumferentially and vessel loops placed around them.  There was a pulse within the common femoral artery. The distal external iliac artery was dissected free circumferentially underneath the inguinal ligament.  A vessel loop was also placed around the distal external iliac artery.   Next the saphenofemoral junction was identified in the medial portion of the groin incision and this was harvested through several skip incisions on the medial aspect of the leg.  Side branches were ligated and divided between silk ties or clips.  The vein was of good quality 3-4 mm diameter  The vein harvest incision was deepened into the fascia at the below knee segment and the below knee popliteal space was entered.  The popliteal artery was dissected free circumferentially.  It  was calcified in islands but was clampable.  The arteriogram showed some narrowing at the takeoff of the tibial vessels so the anterior tibial artery and tibioperoneal trunk were also dissected free and vessel loops placed around these.  A tunnel was then created between the heads of the gastrocnemius muscle subsartorial up to the groin.  The vein was ligated distally and at the saphenofemoral junction with clips and oversewn proximally with a 5 0 prolene.  The vein was gently distended with heparinized saline and inspected for hemostasis.    The patient was given 7000 units of heparin.  After appropriate circulation time, the distal right external iliac artery was controlled with a small Cooley clamp. The SFA and profunda were controlled with vessel loops.   A longitudinal opening was made in the common femoral artery on its anterior surface.  The vessel was calcified severely. A femoral endartectomy was performed from the proximal common femoral artery to the origin of the profunda and a good distal endpoint was obtained.  All loose debris was removed and a vein patch was then created by opening a segment of thigh tributary vein.  The was reversed and sewn as a patch from common femoral to profunda using a running 6 0 prolene.  The anastomosis was then tested and a repair stitch placed at the toe.  The vein was placed in a non reversed configuration.  The vein was spatulated and sewn end to side to the artery using a running 6 0 Prolene.  Just prior to completion anastomosis everything was forebled backbled and thoroughly flushed. Proximal clamp and distal clamps were removed and there was good pulsatile flow in the profunda femoris artery immediately. The SFA was chronically occluded.  The graft was then brought through the subsartorial tunnel down to the below-knee popliteal artery after marking for orientation. The below-knee popliteal artery was controlled proximally and distally with a Henley clamp. A  longitudinal opening was made in the distal below-knee popliteal artery in an area that was fairly free of calcification. This was extended all the way to the takeoff of the tibial vessels which were severely calcified.  The graft was then cut to length and spatulated and sewn end of graft to side of artery using running 6-0 Prolene suture.  At completion of the anastomosis everything was forebled backbled and thoroughly flushed. The remainder of the anastomosis was completed and all clamps were removed restoring pulsatile flow to the below-knee popliteal artery. The patient had monophasic to biphasic Doppler flow in the posterior tibial and dorsalis pedis areas of the foot.  A few repair stitches were placed in the lateral wall of the distal anastomosis.  The patient had been given an additional 8000 units of heparin during the case.  The heparin was partially reversed with 50 mg of protamine.  After hemostasis was obtained, the deep layers and subcutaneous layers of the below-knee popliteal incision were closed with running 3-0 Vicryl suture. The skin was closed with staples.   The saphenectomy incisions were closed with running 3 0 vicryl follow by staples.  The groin was inspected and found to be hemostatic. This was then closed in multiple layers of running 2 0 and 3-0 Vicryl suture and 4-0 subcuticular stitch.   Next the a circumferential incision was made on the right first toe at the metatarsal phalangeal level and carried down to the bone.  The toe was amputated with a bone cutter and passed off the table to be sent to pathology.  The proximal phalanx was trimmed back with rongeurs.  The wound was thoroughly irrigated with normal saline and the skin edges re approximated with alternating simple and vertical mattress 3 0 Nylon sutures.    The patient tolerated the procedure well and there were no complications. Instrument sponge and needle counts correct in the case. Patient was taken to the recovery  in stable condition.  [name], MD [facility] [place] Office: [phone] Pager: [phone]

## 2012-03-24 ENCOUNTER — Encounter (HOSPITAL_COMMUNITY): Payer: Self-pay | Admitting: Vascular Surgery

## 2012-03-24 LAB — GLUCOSE, CAPILLARY
Glucose-Capillary: 149 mg/dL — ABNORMAL HIGH (ref 70–99)
Glucose-Capillary: 217 mg/dL — ABNORMAL HIGH (ref 70–99)
Glucose-Capillary: 103 mg/dL — ABNORMAL HIGH (ref 70–99)
Glucose-Capillary: 203 mg/dL — ABNORMAL HIGH (ref 70–99)

## 2012-03-24 LAB — PREPARE RBC (CROSSMATCH)

## 2012-03-24 LAB — BASIC METABOLIC PANEL
BUN: 19 mg/dL (ref 6–23)
CO2: 26 mEq/L (ref 19–32)
Calcium: 8.2 mg/dL — ABNORMAL LOW (ref 8.4–10.5)
Chloride: 96 mEq/L (ref 96–112)
Creatinine, Ser: 1.01 mg/dL (ref 0.50–1.10)
GFR calc Af Amer: 58 mL/min — ABNORMAL LOW (ref >90)
GFR calc non Af Amer: 50 mL/min — ABNORMAL LOW (ref >90)
Glucose, Bld: 152 mg/dL — ABNORMAL HIGH (ref 70–99)
Potassium: 5.1 mEq/L (ref 3.5–5.1)
Sodium: 126 mEq/L — ABNORMAL LOW (ref 135–145)

## 2012-03-24 LAB — HEMOGLOBIN A1C
Hgb A1c MFr Bld: 6.4 % — ABNORMAL HIGH (ref <5.7)
Mean Plasma Glucose: 137 mg/dL — ABNORMAL HIGH (ref <117)

## 2012-03-24 LAB — CBC
HCT: 20.5 % — ABNORMAL LOW (ref 36.0–46.0)
Hemoglobin: 6.7 g/dL — CL (ref 12.0–15.0)
MCH: 29.6 pg (ref 26.0–34.0)
MCHC: 32.7 g/dL (ref 30.0–36.0)
MCV: 90.7 fL (ref 78.0–100.0)
Platelets: 189 10*3/uL (ref 150–400)
RBC: 2.26 MIL/uL — ABNORMAL LOW (ref 3.87–5.11)
RDW: 14.3 % (ref 11.5–15.5)
WBC: 7.6 10*3/uL (ref 4.0–10.5)

## 2012-03-24 MED ORDER — SODIUM CHLORIDE 0.9 % IV SOLN
INTRAVENOUS | Status: DC
  Administered 2012-03-24: 09:00:00 via INTRAVENOUS

## 2012-03-24 NOTE — Progress Notes (Addendum)
[facility] [place]  Progress Note Bypass Surgery  Date of Surgery: 03/23/2012  Procedure(s): BYPASS GRAFT FEMORAL-POPLITEAL ARTERY AMPUTATION DIGIT ENDARTERECTOMY FEMORAL Surgeon: Surgeon(s): Sherren Kerns, MD  1 Day Post-Op  History of Present Illness  Cynthia Ray is a 78 y.o. female who is S/P Procedure(s): BYPASS GRAFT FEMORAL-POPLITEAL ARTERY AMPUTATION DIGIT ENDARTERECTOMY FEMORAL right.  The patient's pre-op symptoms of pain are Improved . Patients pain is well controlled.  C/O pain in right great toe amp.  VASC. LAB Studies:        ABI: pending  Significant Diagnostic Studies: CBC Lab Results  Component Value Date   WBC 7.6 03/24/2012   HGB 6.7* 03/24/2012   HCT 20.5* 03/24/2012   MCV 90.7 03/24/2012   PLT 189 03/24/2012    BMET     Component Value Date/Time   NA 126* 03/24/2012 0500   K 5.1 03/24/2012 0500   CL 96 03/24/2012 0500   CO2 26 03/24/2012 0500   GLUCOSE 152* 03/24/2012 0500   BUN 19 03/24/2012 0500   CREATININE 1.01 03/24/2012 0500   CALCIUM 8.2* 03/24/2012 0500   GFRNONAA 50* 03/24/2012 0500   GFRAA 58* 03/24/2012 0500    COAG Lab Results  Component Value Date   INR 1.08 03/17/2012   INR 1.03 01/19/2012   No results found for this basename: PTT    Physical Examination  BP Readings from Last 3 Encounters:  03/24/12 104/42  03/24/12 104/42  03/17/12 151/69   Temp Readings from Last 3 Encounters:  03/24/12 98.2 F (36.8 C) Oral  03/24/12 98.2 F (36.8 C) Oral  03/17/12 98.7 F (37.1 C)    SpO2 Readings from Last 3 Encounters:  03/24/12 100%  03/24/12 100%  03/17/12 95%   Pulse Readings from Last 3 Encounters:  03/23/12 83  03/23/12 83  03/17/12 80    Pt is A&O x 3 right lower extremity: Incision/s is/are clean,dry.intact, and  healing without hematoma, erythema or drainage Limb is warm; with good color Dressing on right intact  Right Dorsalis Pedis pulse is monophasic by Doppler RightPosterior  tibial pulse is  monophasic by Doppler Right peroneal best monophasic doppler signal  Assessment/Plan: Acute post-op blood loss anemia - transfuse today DC foley today FOB up - right heel off bed Pt. Doing well Post-op pain is controlled Wounds are healing well - will change dressing to toe amp site tomorrow PT/OT for ambulation Continue wound care as ordered Hyponatremia - asymptomatic - change IVF Cynthia Ray [phone] 03/24/2012 7:29 AM    Agree with above findings Right foot warm good doppler signal  [name], MD [facility] [place] Office: [phone] Pager: [phone]

## 2012-03-24 NOTE — Progress Notes (Signed)
CRITICAL VALUE ALERT  Critical value received: hgb 6.7  Date of notification:  03/24/12  Time of notification: 06:30  Critical value read back: yes  Nurse who received alert: Vivi Martens RN   MD notified (1st page):  Fields   Time of first page: 06:35  MD notified (2nd page): Fields  Time of second page: 06:45  Responding MD: Darrick Penna  Time MD responded: 06:50  Vivi Martens RN

## 2012-03-24 NOTE — Evaluation (Signed)
Physical Therapy Evaluation Patient Details Name: Cynthia Ray MRN: [id number] DOB: Oct 22, 1925 Today's Date: 03/24/2012 Time: 3086-5784 PT Time Calculation (min): 20 min  PT Assessment / Plan / Recommendation Clinical Impression  pt adm for femoral endart. BPG and R great toe amp.  Mobility on eval limited to bed mobility due to low MAP of 55.  Expect pt may be limited by pain, but should make steady gains and benefit from HHPT.    PT Assessment  Patient needs continued PT services    Follow Up Recommendations  Home health PT    Barriers to Discharge        Equipment Recommendations  Other (comment) (may need RW instead of her 4 wheeled RW)    Recommendations for Other Services     Frequency Min 3X/week    Precautions / Restrictions Precautions Precautions: Fall Restrictions Weight Bearing Restrictions: No   Pertinent Vitals/Pain       Mobility  Bed Mobility Bed Mobility: Rolling Right;Rolling Left Rolling Right: 6: Modified independent (Device/Increase time);With rail Rolling Left: 6: Modified independent (Device/Increase time);With rail Details for Bed Mobility Assistance: pt reports sleeping in recliner at home Transfers Transfers: Not assessed Ambulation/Gait Ambulation/Gait Assistance: Not tested (comment) Stairs: No    Exercises General Exercises - Lower Extremity Ankle Circles/Pumps: AROM;10 reps;Supine;Both Heel Slides: AROM;Both;10 reps;Supine Hip ABduction/ADduction: AROM;Both;10 reps;Supine Straight Leg Raises: AROM;Both;10 reps;Supine   PT Diagnosis: Acute pain  PT Problem List: Decreased activity tolerance;Decreased mobility;Decreased knowledge of use of DME;Pain;Cardiopulmonary status limiting activity PT Treatment Interventions: DME instruction;Gait training;Functional mobility training;Therapeutic activities;Patient/family education   PT Goals Acute Rehab PT Goals PT Goal Formulation: With patient/family Time For Goal Achievement:  04/07/12 Potential to Achieve Goals: Good Pt will go Supine/Side to Sit: with modified independence PT Goal: Supine/Side to Sit - Progress: Goal set today Pt will go Sit to Stand: with modified independence PT Goal: Sit to Stand - Progress: Goal set today Pt will Transfer Bed to Chair/Chair to Bed: with modified independence PT Transfer Goal: Bed to Chair/Chair to Bed - Progress: Goal set today Pt will Ambulate: 51 - 150 feet;with modified independence;with least restrictive assistive device PT Goal: Ambulate - Progress: Goal set today Pt will Go Up / Down Stairs: 1-2 stairs;with modified independence;with rail(s);with least restrictive assistive device PT Goal: Up/Down Stairs - Progress: Goal set today  Visit Information  Last PT Received On: 03/24/12 Assistance Needed: +1    Subjective Data  Subjective: I haven't been able to do much outside since my toe problem. Patient Stated Goal: Home and able to live alone   Prior Functioning  Home Living Lives With: Alone Available Help at Discharge: Family Type of Home: House Home Access: Level entry;Stairs to enter Entrance Stairs-Number of Steps: 2 Entrance Stairs-Rails: None Home Layout: One level Bathroom Shower/Tub: Walk-in shower;Door [employer] Toilet: Standard Bathroom Accessibility: Yes How Accessible: Accessible via walker Home Adaptive Equipment: Shower chair with back;Hand-held shower hose;Walker - standard;Walker - four wheeled Prior Function Level of Independence: Independent Able to Take Stairs?: Yes Driving: No Vocation: Retired Musician: No difficulties Dominant Hand: Right    Cognition  Overall Cognitive Status: Appears within functional limits for tasks assessed/performed Arousal/Alertness: Awake/alert Orientation Level: Appears intact for tasks assessed Behavior During Session: [facility] for tasks performed    Extremity/Trunk Assessment Right Upper Extremity Assessment RUE ROM/Strength/Tone:  Within functional levels RUE Sensation: WFL - Light Touch RUE Coordination: WFL - gross/fine motor Left Upper Extremity Assessment LUE ROM/Strength/Tone: Within functional levels LUE Sensation: WFL -  Light Touch LUE Coordination: WFL - gross/fine motor Right Lower Extremity Assessment RLE ROM/Strength/Tone: Within functional levels Left Lower Extremity Assessment LLE ROM/Strength/Tone: Within functional levels Trunk Assessment Trunk Assessment: Normal   Balance    End of Session PT - End of Session Activity Tolerance: Patient tolerated treatment well (Eval and treat limited due to low MAP) Patient left: in bed;with family/visitor present;with call bell/phone within reach Nurse Communication: Mobility status  GP     Alena Blankenbeckler, Eliseo Gum 03/24/2012, 3:41 PM  03/24/2012  [place], PT [phone] (pager)

## 2012-03-24 NOTE — Progress Notes (Signed)
Clinical Social Work  CSW received inappropriate referral for SNF. Per chart review, PT/OT recommend HH. CSW is signing off but available if needed.  [place], [place] [phone]

## 2012-03-24 NOTE — Evaluation (Signed)
Occupational Therapy Evaluation Patient Details Name: Cynthia Ray MRN: [id number] DOB: Nov 19, 1925 Today's Date: 03/24/2012 Time: 4098-1191 OT Time Calculation (min): 15 min  OT Assessment / Plan / Recommendation Clinical Impression  78 yo female s/p Rt BGF and endarterectomy femoral with Rt great toe amputation. Pt pleasant and cooperative. OT to follow acutely. Recommend HHOT     OT Assessment  Patient needs continued OT Services    Follow Up Recommendations  Home health OT    Barriers to Discharge      Equipment Recommendations  None recommended by OT    Recommendations for Other Services    Frequency  Min 2X/week    Precautions / Restrictions Precautions Precautions: Fall   Pertinent Vitals/Pain BP  Supine 71/52                     93/45                    86/44 Pt with transfused x1 unit of blood pending 2nd unit    ADL  Eating/Feeding: Simulated;Set up Where Assessed - Eating/Feeding: Bed level Grooming: Performed;Wash/dry face;Set up Where Assessed - Grooming: Supine, head of bed up ADL Comments: Session limited by BP low with MAP less than 60. Pt currently not safe to progress to EOB level. Session focused on bed level exercises hip abduction, flexion and ankle pumps. Pt log rolled Rt and Lt using bed rail.     OT Diagnosis: Generalized weakness  OT Problem List: Decreased strength;Impaired balance (sitting and/or standing);Decreased activity tolerance;Pain;Decreased knowledge of use of DME or AE;Decreased safety awareness;Decreased knowledge of precautions OT Treatment Interventions: Self-care/ADL training;DME and/or AE instruction;Therapeutic activities;Patient/family education;Balance training   OT Goals Acute Rehab OT Goals OT Goal Formulation: With patient Time For Goal Achievement: 04/07/12 Potential to Achieve Goals: Good ADL Goals Pt Will Perform Grooming: with modified independence;Standing at sink ADL Goal: Grooming - Progress: Goal set  today Pt Will Perform Upper Body Bathing: with modified independence;Standing at sink ADL Goal: Upper Body Bathing - Progress: Goal set today Pt Will Perform Lower Body Bathing: with modified independence;Standing at sink ADL Goal: Lower Body Bathing - Progress: Goal set today Pt Will Perform Upper Body Dressing: with modified independence;Sit to stand from chair ADL Goal: Upper Body Dressing - Progress: Goal set today Pt Will Perform Lower Body Dressing: with modified independence;Sit to stand from chair ADL Goal: Lower Body Dressing - Progress: Goal set today Pt Will Transfer to Toilet: with modified independence;Ambulation;3-in-1 ADL Goal: Toilet Transfer - Progress: Goal set today Pt Will Perform Toileting - Clothing Manipulation: with modified independence;Sitting on 3-in-1 or toilet ADL Goal: Toileting - Clothing Manipulation - Progress: Goal set today Pt Will Perform Toileting - Hygiene: with modified independence;Sit to stand from 3-in-1/toilet ADL Goal: Toileting - Hygiene - Progress: Goal set today  Visit Information  Last OT Received On: 03/24/12 Assistance Needed: +1 PT/OT Co-Evaluation/Treatment: Yes    Subjective Data  Subjective: "I haven't been able to go to the grocery store or anything" Patient Stated Goal: pt ready to return to exiting the house (shopping) without pain   Prior Functioning  Vision/Perception  Home Living Lives With: Alone Available Help at Discharge: Family (intermentent) Type of Home: House Home Access: Level entry;Stairs to enter Entrance Stairs-Number of Steps: 2 Entrance Stairs-Rails: None Home Layout: One level Bathroom Shower/Tub: Walk-in shower;Door [employer] Toilet: Standard Bathroom Accessibility: Yes How Accessible: Accessible via walker Home Adaptive Equipment: Shower chair with back;Hand-held shower hose;Walker -  standard;Walker - four wheeled Prior Function Level of Independence: Independent Able to Take Stairs?: Yes Driving:  No Vocation: Retired Musician: No difficulties Dominant Hand: Right      Cognition  Overall Cognitive Status: Appears within functional limits for tasks assessed/performed Arousal/Alertness: Awake/alert Orientation Level: Appears intact for tasks assessed Behavior During Session: [facility] for tasks performed    Extremity/Trunk Assessment Right Upper Extremity Assessment RUE ROM/Strength/Tone: Within functional levels RUE Sensation: WFL - Light Touch RUE Coordination: WFL - gross/fine motor Left Upper Extremity Assessment LUE ROM/Strength/Tone: Within functional levels LUE Sensation: WFL - Light Touch LUE Coordination: WFL - gross/fine motor Trunk Assessment Trunk Assessment: Normal   Mobility  Shoulder Instructions  Bed Mobility Bed Mobility: Rolling Right;Rolling Left Rolling Right: 6: Modified independent (Device/Increase time);With rail Rolling Left: 6: Modified independent (Device/Increase time);With rail Details for Bed Mobility Assistance: pt reports sleeping in recliner at home       Exercise     Balance     End of Session OT - End of Session Activity Tolerance: Treatment limited secondary to medical complications (Comment) Patient left: in bed;with call bell/phone within reach;with family/visitor present Nurse Communication: Precautions  GO     Lucile Shutters 03/24/2012, 2:57 PM Pager: [phone]

## 2012-03-25 DIAGNOSIS — I739 Peripheral vascular disease, unspecified: Secondary | ICD-10-CM

## 2012-03-25 LAB — CBC
HCT: 29.1 % — ABNORMAL LOW (ref 36.0–46.0)
Hemoglobin: 10 g/dL — ABNORMAL LOW (ref 12.0–15.0)
MCH: 30.1 pg (ref 26.0–34.0)
MCHC: 34.4 g/dL (ref 30.0–36.0)
MCV: 87.7 fL (ref 78.0–100.0)
Platelets: 149 10*3/uL — ABNORMAL LOW (ref 150–400)
RBC: 3.32 MIL/uL — ABNORMAL LOW (ref 3.87–5.11)
RDW: 15.8 % — ABNORMAL HIGH (ref 11.5–15.5)
WBC: 7.7 10*3/uL (ref 4.0–10.5)

## 2012-03-25 LAB — URINALYSIS, ROUTINE W REFLEX MICROSCOPIC
Bilirubin Urine: NEGATIVE
Glucose, UA: NEGATIVE mg/dL
Hgb urine dipstick: NEGATIVE
Ketones, ur: NEGATIVE mg/dL
Leukocytes, UA: NEGATIVE
Nitrite: NEGATIVE
Protein, ur: NEGATIVE mg/dL
Specific Gravity, Urine: 1.008 (ref 1.005–1.030)
Urobilinogen, UA: 0.2 mg/dL (ref 0.0–1.0)
pH: 6 (ref 5.0–8.0)

## 2012-03-25 LAB — GLUCOSE, CAPILLARY
Glucose-Capillary: 231 mg/dL — ABNORMAL HIGH (ref 70–99)
Glucose-Capillary: 174 mg/dL — ABNORMAL HIGH (ref 70–99)
Glucose-Capillary: 111 mg/dL — ABNORMAL HIGH (ref 70–99)
Glucose-Capillary: 108 mg/dL — ABNORMAL HIGH (ref 70–99)
Glucose-Capillary: 188 mg/dL — ABNORMAL HIGH (ref 70–99)

## 2012-03-25 LAB — TYPE AND SCREEN
ABO/RH(D): A POS
Antibody Screen: NEGATIVE
Unit division: 0
Unit division: 0

## 2012-03-25 LAB — BASIC METABOLIC PANEL
BUN: 18 mg/dL (ref 6–23)
CO2: 24 mEq/L (ref 19–32)
Calcium: 8.3 mg/dL — ABNORMAL LOW (ref 8.4–10.5)
Chloride: 96 mEq/L (ref 96–112)
Creatinine, Ser: 1.07 mg/dL (ref 0.50–1.10)
GFR calc Af Amer: 54 mL/min — ABNORMAL LOW (ref >90)
GFR calc non Af Amer: 47 mL/min — ABNORMAL LOW (ref >90)
Glucose, Bld: 182 mg/dL — ABNORMAL HIGH (ref 70–99)
Potassium: 4.6 mEq/L (ref 3.5–5.1)
Sodium: 129 mEq/L — ABNORMAL LOW (ref 135–145)

## 2012-03-25 NOTE — Progress Notes (Addendum)
[facility] [place]  Progress Note Bypass Surgery  Date of Surgery: 03/23/2012  Procedure(s): BYPASS GRAFT FEMORAL-POPLITEAL ARTERY AMPUTATION DIGIT ENDARTERECTOMY FEMORAL Surgeon: Surgeon(s): Sherren Kerns, MD  2 Days Post-Op  History of Present Illness  Cynthia Ray is a 78 y.o. female who is S/P  Right BYPASS GRAFT FEMORAL-POPLITEAL ARTERY AMPUTATION DIGIT ENDARTERECTOMY FEMORAL.  The patient's pre-op symptoms of pain are Improved . Patients pain is well controlled.  Beginning to ambulate.   VASC. LAB Studies:        ABI: pending  Significant Diagnostic Studies: CBC Lab Results  Component Value Date   WBC 7.7 03/25/2012   HGB 10.0* 03/25/2012   HCT 29.1* 03/25/2012   MCV 87.7 03/25/2012   PLT 149* 03/25/2012    BMET     Component Value Date/Time   NA 129* 03/25/2012 0509   K 4.6 03/25/2012 0509   CL 96 03/25/2012 0509   CO2 24 03/25/2012 0509   GLUCOSE 182* 03/25/2012 0509   BUN 18 03/25/2012 0509   CREATININE 1.07 03/25/2012 0509   CALCIUM 8.3* 03/25/2012 0509   GFRNONAA 47* 03/25/2012 0509   GFRAA 54* 03/25/2012 0509    COAG Lab Results  Component Value Date   INR 1.08 03/17/2012   INR 1.03 01/19/2012   No results found for this basename: PTT    Physical Examination  BP Readings from Last 3 Encounters:  03/25/12 121/45  03/25/12 121/45  03/17/12 151/69   Temp Readings from Last 3 Encounters:  03/25/12 98.8 F (37.1 C) Oral  03/25/12 98.8 F (37.1 C) Oral  03/17/12 98.7 F (37.1 C)    SpO2 Readings from Last 3 Encounters:  03/25/12 95%  03/25/12 95%  03/17/12 95%   Pulse Readings from Last 3 Encounters:  03/25/12 94  03/25/12 94  03/17/12 80    Pt is A&O x 3 right lower extremity: Incision/s is/are clean,dry.intact, and  healing without hematoma, erythema or drainage Limb is warm; with good color  Right AT pulse is monophasic by Doppler Right Peroneal pulse is  monophasic by Doppler Right great toe amp site  clean and healing well Assessment/Plan: Pt. Doing well Post-op pain is controlled Wounds are healing well PT/OT for ambulation Continue wound care as ordered Transfer to floor NSL IVF Darco shoe for right foot  [name] [phone] 03/25/2012 8:03 AM   History and exam details as above Patent bypass Transfer to 2000 Need to make decision on d/c to home or SNF will get PT OT input  [name], MD [facility] [place] Office: [phone] Pager: [phone]

## 2012-03-25 NOTE — Progress Notes (Signed)
Inpatient Diabetes Program Recommendations  AACE/ADA: New Consensus Statement on Inpatient Glycemic Control  Target Ranges:  Prepandial:   less than 140 mg/dL      Peak postprandial:   less than 180 mg/dL (1-2 hours)      Critically ill patients:  140 - 180 mg/dL  Pager:  [phone] Hours:  8 am-10pm   Reason for Visit: Elevated glucose: 231 mg/dl  Inpatient Diabetes Program Recommendations Correction (SSI): Increase to Resistant Novolog Correction TID and HS Diet: Add CHO Modified Medium to diet  Alfredia Client PhD, RN, BC-ADM Diabetes Coordinator  Office:  [phone] Team Pager:  [phone]

## 2012-03-25 NOTE — Progress Notes (Signed)
Occupational Therapy Treatment Patient Details Name: Cynthia Ray MRN: [id number] DOB: January 04, 1926 Today's Date: 03/25/2012 Time: 1478-2956 OT Time Calculation (min): 22 min  OT Assessment / Plan / Recommendation Comments on Treatment Session Pt reports that she can arrange 24/7 assist at home between family and friends.  Discussed with pt recommendation for 24/7 assist at home for safety, otherwise will need to consider ST SNF.  Pt verbalized understanding and reports a friend can stay with  her while her daughter is at work.    Follow Up Recommendations  Home health OT;Supervision/Assistance - 24 hour    Barriers to Discharge       Equipment Recommendations  3 in 1 bedside comode;None recommended by PT    Recommendations for Other Services    Frequency Min 2X/week   Plan Discharge plan remains appropriate    Precautions / Restrictions Precautions Precautions: Fall Restrictions Weight Bearing Restrictions: No   Pertinent Vitals/Pain See vitals    ADL  Toilet Transfer: Performed;Minimal assistance Toilet Transfer Method: Stand pivot Toilet Transfer Equipment: Bedside commode Toileting - Clothing Manipulation and Hygiene: Performed;Min guard Where Assessed - Glass blower/designer Manipulation and Hygiene: Sit to stand from 3-in-1 or toilet Equipment Used: Gait belt;Rolling walker Transfers/Ambulation Related to ADLs: Min assist for safety ADL Comments: Min assist with toilet transfer.     OT Diagnosis:    OT Problem List:   OT Treatment Interventions:     OT Goals ADL Goals Pt Will Transfer to Toilet: with modified independence;Ambulation;3-in-1 ADL Goal: Toilet Transfer - Progress: Progressing toward goals Pt Will Perform Toileting - Clothing Manipulation: with modified independence;Sitting on 3-in-1 or toilet ADL Goal: Toileting - Clothing Manipulation - Progress: Progressing toward goals Pt Will Perform Toileting - Hygiene: with modified independence;Sit to stand  from 3-in-1/toilet ADL Goal: Toileting - Hygiene - Progress: Progressing toward goals  Visit Information  Last OT Received On: 03/25/12 Assistance Needed: +1    Subjective Data      Prior Functioning       Cognition  Overall Cognitive Status: Appears within functional limits for tasks assessed/performed Arousal/Alertness: Awake/alert Orientation Level: Appears intact for tasks assessed Behavior During Session: [facility] for tasks performed    Mobility  Shoulder Instructions Bed Mobility Bed Mobility: Not assessed Transfers Sit to Stand: 4: Min assist;3: Mod assist;With upper extremity assist;From chair/3-in-1 Stand to Sit: 4: Min assist;With upper extremity assist;To chair/3-in-1 Details for Transfer Assistance: Mod assist from chair, min assist from 3n1. VC for hand placement for a safe stand. Min assist for control both standing and sitting. Pt with improvements in stand with repetition       Exercises      Balance     End of Session OT - End of Session Equipment Utilized During Treatment: Gait belt Activity Tolerance: Patient tolerated treatment well Patient left: in chair;with call bell/phone within reach  GO    03/25/2012 Cipriano Mile OTR/L Pager [phone] Office [phone]  Cipriano Mile 03/25/2012, 4:34 PM

## 2012-03-25 NOTE — Progress Notes (Signed)
Physical Therapy Treatment Patient Details Name: SANAA ZILBERMAN MRN: [id number] DOB: 1926-02-20 Today's Date: 03/25/2012 Time: 6578-4696 PT Time Calculation (min): 21 min  PT Assessment / Plan / Recommendation Comments on Treatment Session  Pt progressing well. Discussed option of HH with near 24 hr supervision vs SNF. Pt stated that someone will be there as close to 24/7 as possible for safety upon d/c    Follow Up Recommendations  Home health PT;Supervision/Assistance - 24 hour    Barriers to Discharge        Equipment Recommendations  None recommended by PT;None recommended by OT    Recommendations for Other Services    Frequency Min 3X/week   Plan Discharge plan remains appropriate;Frequency remains appropriate    Precautions / Restrictions Precautions Precautions: Fall Restrictions Weight Bearing Restrictions: No   Pertinent Vitals/Pain VSS throughout session.     Mobility  Bed Mobility Bed Mobility: Not assessed Transfers Transfers: Sit to Stand;Stand to Sit Sit to Stand: 4: Min assist;3: Mod assist;With upper extremity assist;From chair/3-in-1 Stand to Sit: 4: Min assist;With upper extremity assist;To chair/3-in-1 Details for Transfer Assistance: VC for hand placement for a safe stand. Min assist for control both standing and sitting. Pt with improvements in stand with repetition Ambulation/Gait Ambulation/Gait Assistance: 4: Min assist Ambulation Distance (Feet): 40 Feet Assistive device: Rolling walker Ambulation/Gait Assistance Details: VC for safe distance and proper sequencing with RW. Min assist for safety. Pt near close to even with weight shifting, still slightly limited with R weightbearing.  Cueing multiple times throughout ambulation for upright posture. Gait Pattern: Step-to pattern;Decreased hip/knee flexion - right;Decreased stride length;Decreased weight shift to right;Ataxic;Trunk flexed;Narrow base of support Gait velocity: decreased gait speed    Exercises     PT Diagnosis:    PT Problem List:   PT Treatment Interventions:     PT Goals Acute Rehab PT Goals PT Goal Formulation: With patient/family PT Goal: Sit to Stand - Progress: Progressing toward goal PT Transfer Goal: Bed to Chair/Chair to Bed - Progress: Progressing toward goal PT Goal: Ambulate - Progress: Progressing toward goal  Visit Information  Last PT Received On: 03/25/12 Assistance Needed: +1    Subjective Data      Cognition  Overall Cognitive Status: Appears within functional limits for tasks assessed/performed Arousal/Alertness: Awake/alert Orientation Level: Appears intact for tasks assessed Behavior During Session: [facility] for tasks performed    Balance     End of Session PT - End of Session Equipment Utilized During Treatment: Gait belt Activity Tolerance: Patient tolerated treatment well Patient left: in chair;with call bell/phone within reach Nurse Communication: Mobility status   GP     Milana Kidney 03/25/2012, 3:48 PM

## 2012-03-25 NOTE — Progress Notes (Signed)
Report called to RN.  Pt going to 2001 via wheelchair with belongings.  Shanique Aslinger B

## 2012-03-25 NOTE — Progress Notes (Signed)
VASCULAR LAB PRELIMINARY  ARTERIAL  ABI completed:    RIGHT    LEFT    PRESSURE WAVEFORM  PRESSURE WAVEFORM  BRACHIAL 139 Triphasic BRACHIAL 144 Triphasic         AT 112 Monophasic AT 118 Monophasic  PT 104 Monophasic PT  Not found                  RIGHT LEFT  ABI 0.78 0.82   ABIs have increased post operative.  Cynthia Ray, RVS 03/25/2012, 5:18 PM

## 2012-03-26 ENCOUNTER — Telehealth: Payer: Self-pay | Admitting: Vascular Surgery

## 2012-03-26 LAB — CBC
HCT: 28.3 % — ABNORMAL LOW (ref 36.0–46.0)
Hemoglobin: 9.5 g/dL — ABNORMAL LOW (ref 12.0–15.0)
MCH: 29.6 pg (ref 26.0–34.0)
MCHC: 33.6 g/dL (ref 30.0–36.0)
MCV: 88.2 fL (ref 78.0–100.0)
Platelets: 160 10*3/uL (ref 150–400)
RBC: 3.21 MIL/uL — ABNORMAL LOW (ref 3.87–5.11)
RDW: 15.5 % (ref 11.5–15.5)
WBC: 6.7 10*3/uL (ref 4.0–10.5)

## 2012-03-26 LAB — BASIC METABOLIC PANEL
BUN: 15 mg/dL (ref 6–23)
CO2: 27 mEq/L (ref 19–32)
Calcium: 8.5 mg/dL (ref 8.4–10.5)
Chloride: 96 mEq/L (ref 96–112)
Creatinine, Ser: 1.01 mg/dL (ref 0.50–1.10)
GFR calc Af Amer: 58 mL/min — ABNORMAL LOW (ref >90)
GFR calc non Af Amer: 50 mL/min — ABNORMAL LOW (ref >90)
Glucose, Bld: 179 mg/dL — ABNORMAL HIGH (ref 70–99)
Potassium: 4.8 mEq/L (ref 3.5–5.1)
Sodium: 130 mEq/L — ABNORMAL LOW (ref 135–145)

## 2012-03-26 LAB — GLUCOSE, CAPILLARY
Glucose-Capillary: 167 mg/dL — ABNORMAL HIGH (ref 70–99)
Glucose-Capillary: 210 mg/dL — ABNORMAL HIGH (ref 70–99)
Glucose-Capillary: 150 mg/dL — ABNORMAL HIGH (ref 70–99)
Glucose-Capillary: 113 mg/dL — ABNORMAL HIGH (ref 70–99)

## 2012-03-26 MED ORDER — OXYCODONE-ACETAMINOPHEN 5-325 MG PO TABS: 1.0000 | ORAL_TABLET | ORAL | Status: AC | PRN

## 2012-03-26 NOTE — Care Management Note (Addendum)
    Page 1 of 2   03/28/2012     10:21:44 AM   CARE MANAGEMENT NOTE 03/28/2012  Patient:  Cynthia Ray, Cynthia Ray   Account Number:  [id number]  Date Initiated:  03/26/2012  Documentation initiated by:  AMERSON,JULIE  Subjective/Objective Assessment:   PT S/P RT FEM POP BYPASS AND TOE AMPUTATION ON 03/23/12. PTA, PT INDEPENDENT,, LIVES ALONE, BUT DAUGHTER LIVES NEXT DOOR.     Action/Plan:   MET WITH PT TO DISCUSS DC PLANS.  PT STATES SHE WISHES TO GO HOME WITH HOME HEALTH AND DAUGHTER TO ASSIST.  SHE HAS RW AT HOME, IF NEEDED.   Anticipated DC Date:  03/27/2012   Anticipated DC Plan:  HOME W HOME HEALTH SERVICES      DC Planning Services  CM consult      [facility] Choice  HOME HEALTH   Choice offered to / List presented to:  C-1 Patient        HH arranged  HH-1 RN  HH-2 PT  HH-3 OT      [facility] agency  [facility]   Status of service:  Completed, signed off Medicare Important Message given?   (If response is "NO", the following Medicare IM given date fields will be blank) Date Medicare IM given:   Date Additional Medicare IM given:    Discharge Disposition:  HOME W HOME HEALTH SERVICES  Per UR Regulation:    If discussed at Long Length of Stay Meetings, dates discussed:    Comments:  03/28/2012 1000 Received call from [facility] and pt's soc will be on 9/3 or 9/4 if she is d/c on 9/2. [facility] info entered on d/c instructions. Isidoro Donning RN CCM Case Mgmt phone [phone]  03/27/2012 1600 Spoke to daughter, and from [facility] list. They requested [facility]. Faxed orders to [facility]. Pt scheduled for d/c on 9/2.  Requested a start of care date from [facility]. States no DME is needed at this time. Left message for MD to clarify [facility] RN order for daily dressing changes if needed. Will need F2F for [facility] RN also. Isidoro Donning RN CCM Case Mgmt phone [phone]  03/26/12 JULIE AMERSON,RN,BSN 1300 PT GIVEN LIST OF [facility] [place] HOME HEALTH AGENCIES FOR REVIEW.  SHE WISHES TO DISCUSS THIS WITH HER  DAUGHTER TONIGHT WHEN SHE VISITS.  WILL HAVE WEEKEND CASE MANAGER FOLLOW UP WITH PT'S DECISION FOR HH AGENCY IN AM.

## 2012-03-26 NOTE — Progress Notes (Signed)
UR Completed.  Cynthia Ray [phone] 03/26/2012

## 2012-03-26 NOTE — Progress Notes (Addendum)
[facility] Progress Note  03/26/2012 7:19 AM POD 2  Subjective:  States she had some N/V last evening, but this is improved.  States she walked a little yesterday.  Only complaint is sore right groin.  Tm 98.8  VSS   96%RA Filed Vitals:   03/26/12 0344  BP: 132/71  Pulse: 87  Temp: 98.2 F (36.8 C)  Resp: 20    Physical Exam: Incisions:  Right groin incision is c/d/i all other incisions are c/d/i with staples in tact. Extremities:  Right foot wrapped -dressing dry.  + monophasic PT on the right.  CBC    Component Value Date/Time   WBC 6.7 03/26/2012 0630   RBC 3.21* 03/26/2012 0630   HGB 9.5* 03/26/2012 0630   HCT 28.3* 03/26/2012 0630   PLT 160 03/26/2012 0630   MCV 88.2 03/26/2012 0630   MCH 29.6 03/26/2012 0630   MCHC 33.6 03/26/2012 0630   RDW 15.5 03/26/2012 0630    BMET    Component Value Date/Time   NA 129* 03/25/2012 0509   K 4.6 03/25/2012 0509   CL 96 03/25/2012 0509   CO2 24 03/25/2012 0509   GLUCOSE 182* 03/25/2012 0509   BUN 18 03/25/2012 0509   CREATININE 1.07 03/25/2012 0509   CALCIUM 8.3* 03/25/2012 0509   GFRNONAA 47* 03/25/2012 0509   GFRAA 54* 03/25/2012 0509    INR    Component Value Date/Time   INR 1.08 03/17/2012 1229     Intake/Output Summary (Last 24 hours) at 03/26/12 0719 Last data filed at 03/26/12 0402  Gross per 24 hour  Intake  647.5 ml  Output   2750 ml  Net -2102.5 ml   ABI's 03/25/12   RIGHT    LEFT     PRESSURE  WAVEFORM   PRESSURE  WAVEFORM   BRACHIAL  139  Triphasic  BRACHIAL  144  Triphasic          AT  112  Monophasic  AT  118  Monophasic   PT  104  Monophasic  PT   Not found                   RIGHT  LEFT   ABI  0.78  0.82      Assessment/Plan:  78 y.o. female is s/p Right femoral to below knee popliteal bypass with non-reversed ipsilateral great saphenous vein, right femoral endarterectomy with profundaplasty, right first toe amputation  POD 2  -pt needs Darco shoe to ambulate (has been  ordered) -continue mobilization -will get social work involved (already involved) for d/c planning- home vs. SNF -right ABI improved from pre-op.  Doreatha Massed, PA-C [facility] [phone] 03/26/2012 7:19 AM  Incisions and toe amp healing, patent bypass  Agree with above Will need to decide if home or Rehab Pt thinks she may be able to go tomorrow  [name], MD [facility] [place] Office: [phone] Pager: [phone]

## 2012-03-26 NOTE — Telephone Encounter (Addendum)
Message copied by Rosalyn Charters on Fri Mar 26, 2012  3:46 PM ------      Message from: Melene Plan      Created: Fri Mar 26, 2012  3:18 PM                   ----- Message -----         From: [name], [place]         Sent: 03/26/2012   3:03 PM           To: Melene Plan, RN            F/U 3 weeks , Fields, Fem -pop-   notified patient of fu appt. with dr. fields on 04-15-12 9 a.m. l/v/m and mailed appt. letter

## 2012-03-26 NOTE — Progress Notes (Signed)
Physical Therapy Treatment Patient Details Name: Cynthia Ray MRN: [id number] DOB: 12-29-1925 Today's Date: 03/26/2012 Time: 1308-6578 PT Time Calculation (min): 14 min  PT Assessment / Plan / Recommendation Comments on Treatment Session  78 y.o. female s/p fem-pop bypass, femoral enarterectomy and toe amputation.  She is progressing well with her gait, needs cues for safe use of RW and proper gait with DARCO shoe.  We were able to practice the stairs today and she should be able to get into her home with her daughter's hand held assist.  She states that her daughter is going to stay with her this weekend.      Follow Up Recommendations  Home health PT;Supervision/Assistance - 24 hour    Barriers to Discharge        Equipment Recommendations  3 in 1 bedside comode;None recommended by PT    Recommendations for Other Services    Frequency Min 3X/week   Plan Discharge plan remains appropriate;Frequency remains appropriate    Precautions / Restrictions Precautions Precautions: Fall Required Braces or Orthoses: Other Brace/Splint Other Brace/Splint: DARCO shoe right foot   Pertinent Vitals/Pain No reports of pain, pt was premedicated    Mobility  Bed Mobility Supine to Sit: 6: Modified independent (Device/Increase time);HOB elevated;With rails Sitting - Scoot to Edge of Bed: 6: Modified independent (Device/Increase time);With rail Details for Bed Mobility Assistance: relied heavily on rail to get to EOB Transfers Sit to Stand: 5: Supervision;4: Min assist;With upper extremity assist;From elevated surface;From bed;With armrests;From chair/3-in-1 Stand to Sit: 4: Min assist;To chair/3-in-1;With armrests;With upper extremity assist Details for Transfer Assistance: supervision from elevated bed with verbal cues for safe hand placment.  Min assist from lower chair with verbal cues again for safe hand placment.  Min assist needed to sit to help control descnet.  Pt likes to "park" the  RW and then back up to the chair, reinforced RW safety for sitting down.   Ambulation/Gait Ambulation/Gait Assistance: 4: Min assist Ambulation Distance (Feet): 120 Feet Assistive device: Rolling walker Ambulation/Gait Assistance Details: verbal cues for upright posture and safety using the [facility] shoe (WB through heel-pt kept walking on her toe on the right).   Gait Pattern: Step-through pattern;Trunk flexed;Antalgic (rocking forward on right foot in [facility] shoe) General Gait Details: pt tends to keep RW too far away from her during gait Stairs: Yes Stairs Assistance: 4: Min assist Stairs Assistance Details (indicate cue type and reason): min assist to provide a hand to hold to get up the stairs.   Stair Management Technique: One rail Left;Forwards;Other (comment) (one hand held on right) Number of Stairs: 2     Exercises     PT Diagnosis:    PT Problem List:   PT Treatment Interventions:     PT Goals Acute Rehab PT Goals Pt will go Supine/Side to Sit: with modified independence;with HOB 0 degrees PT Goal: Supine/Side to Sit - Progress: Updated due to goal met PT Goal: Sit to Stand - Progress: Progressing toward goal PT Goal: Ambulate - Progress: Progressing toward goal PT Goal: Up/Down Stairs - Progress: Progressing toward goal  Visit Information  Last PT Received On: 03/26/12 Assistance Needed: +1    Subjective Data  Subjective: "i had a pain pill earlier and now my foot doesn't hurt"   Cognition  Overall Cognitive Status: Appears within functional limits for tasks assessed/performed    Balance     End of Session PT - End of Session Equipment Utilized During Treatment: Gait belt Activity Tolerance:  Patient limited by fatigue Patient left: in chair;with call bell/phone within reach Nurse Communication: Mobility status   GP     Lurena Joiner B. Nevin Kozuch, PT, DPT [phone]   03/26/2012, 11:46 AM

## 2012-03-26 NOTE — Progress Notes (Signed)
Occupational Therapy Treatment Patient Details Name: Cynthia Ray MRN: [id number] DOB: [birth year] Today's Date: 03/26/2012 Time: 5621-3086 OT Time Calculation (min): 23 min  OT Assessment / Plan / Recommendation Comments on Treatment Session      Follow Up Recommendations  Home health OT       Equipment Recommendations  3 in 1 bedside comode       Frequency Min 2X/week   Plan Discharge plan remains appropriate    Precautions / Restrictions Precautions Precautions: Fall Required Braces or Orthoses: Other Brace/Splint Other Brace/Splint: DARCO shoe right foot       ADL  Grooming: Performed;Wash/dry hands;Wash/dry face;Min guard Where Assessed - Grooming: Unsupported standing Toilet Transfer: Min Pension scheme manager Method: Sit to stand;Other (comment) (walk to BR) Toilet Transfer Equipment: Regular height toilet;Grab bars Toileting - Clothing Manipulation and Hygiene: Performed;Min guard Where Assessed - Glass blower/designer Manipulation and Hygiene: Standing      OT Goals ADL Goals ADL Goal: Grooming - Progress: Progressing toward goals ADL Goal: Toilet Transfer - Progress: Progressing toward goals ADL Goal: Toileting - Clothing Manipulation - Progress: Progressing toward goals  Visit Information  Last OT Received On: 03/26/12 Assistance Needed: +1    Subjective Data  Subjective: i am stiff after sitting here      Cognition  Overall Cognitive Status: Appears within functional limits for tasks assessed/performed    Mobility  Shoulder Instructions Bed Mobility Supine to Sit: 6: Modified independent (Device/Increase time);HOB elevated;With rails Sitting - Scoot to Edge of Bed: 6: Modified independent (Device/Increase time);With rail Details for Bed Mobility Assistance: relied heavily on rail to get to EOB Transfers Sit to Stand: 5: Supervision;4: Min assist;With upper extremity assist;From elevated surface;From bed;With armrests;From chair/3-in-1 Stand to  Sit: 4: Min assist;To chair/3-in-1;With armrests;With upper extremity assist Details for Transfer Assistance: supervision from elevated bed with verbal cues for safe hand placment.  Min assist from lower chair with verbal cues again for safe hand placment.  Min assist needed to sit to help control descnet.  Pt likes to "park" the RW and then back up to the chair, reinforced RW safety for sitting down.               End of Session  Pt left in chair with call bell and phone with in reach  GO     Cynthia Ray, Cynthia Ray 03/26/2012, 2:32 PM

## 2012-03-27 LAB — GLUCOSE, CAPILLARY
Glucose-Capillary: 124 mg/dL — ABNORMAL HIGH (ref 70–99)
Glucose-Capillary: 188 mg/dL — ABNORMAL HIGH (ref 70–99)
Glucose-Capillary: 108 mg/dL — ABNORMAL HIGH (ref 70–99)
Glucose-Capillary: 73 mg/dL (ref 70–99)

## 2012-03-27 NOTE — Progress Notes (Signed)
Physical Therapy Treatment Patient Details Name: Cynthia Ray MRN: [id number] DOB: 1925/10/22 Today's Date: 03/27/2012 Time: 4098-1191 PT Time Calculation (min): 25 min  PT Assessment / Plan / Recommendation Comments on Treatment Session  Pt continues to progress with mobility, she wants to d/c home with HHPT and is at a mod I/ S level but she is concerned because she says that her daughter really wants her to go to rehab first since she works during the day.  PT does agree that he fall risk is significant, spoke with pt about getting other family and friends to check in on her frequently during the day and she thinks she can do this.  However, if pt and daughter discuss situation and feel that she still needs to go to rehab, this PT agrees that she could benefit from short SNF stay due to increased fall risk.  Will keep recommendation for HHPT unless pt decides otherwise.    Follow Up Recommendations  Home health PT;Supervision/Assistance - 24 hour    Barriers to Discharge        Equipment Recommendations  3 in 1 bedside comode    Recommendations for Other Services    Frequency Min 3X/week   Plan Discharge plan remains appropriate;Frequency remains appropriate    Precautions / Restrictions Precautions Precautions: Fall Required Braces or Orthoses: Other Brace/Splint Other Brace/Splint: DARCO shoe right foot Restrictions Weight Bearing Restrictions: No   Pertinent Vitals/Pain Pt reports soreness right abdomen at incision, no meds requested    Mobility  Bed Mobility Bed Mobility: Supine to Sit;Sitting - Scoot to Edge of Bed Supine to Sit: 7: Independent Sitting - Scoot to [place] of Bed: 7: Independent Details for Bed Mobility Assistance: pt performed with bed flat and no rail and was able to perform independently Transfers Transfers: Sit to Stand;Stand to Sit Sit to Stand: 6: Modified independent (Device/Increase time);From bed;With upper extremity assist Stand to Sit: 6:  Modified independent (Device/Increase time);To chair/3-in-1;With upper extremity assist Details for Transfer Assistance: vc's to remind of hand placement with sit to stand but pt remembered with stand to sit.  Pt kept RW with her today Ambulation/Gait Ambulation/Gait Assistance: 5: Supervision Ambulation Distance (Feet): 250 Feet Assistive device: Rolling walker Ambulation/Gait Assistance Details: frequent cues for pt to stay within RW, pt also cued herself verbally about this as well as cueing herself to look up instead os at feet.  Pt imrpoved her ambulation with distance. Took one standing rest break. Gait Pattern: Step-through pattern;Trunk flexed;Antalgic Gait velocity: decreased gait speed General Gait Details: pt tends to keep RW too far away from her during gait Stairs: Yes Stairs Assistance: 5: Supervision Stairs Assistance Details (indicate cue type and reason): supervision with rail, vc's to sequence  Stair Management Technique: One rail Left;Forwards Number of Stairs: 4  Wheelchair Mobility Wheelchair Mobility: No    Exercises     PT Diagnosis:    PT Problem List:   PT Treatment Interventions:     PT Goals Acute Rehab PT Goals PT Goal Formulation: With patient/family Time For Goal Achievement: 04/07/12 Potential to Achieve Goals: Good Pt will go Supine/Side to Sit: with modified independence;with HOB 0 degrees PT Goal: Supine/Side to Sit - Progress: Met Pt will go Sit to Stand: with modified independence PT Goal: Sit to Stand - Progress: Met Pt will Transfer Bed to Chair/Chair to Bed: with modified independence PT Transfer Goal: Bed to Chair/Chair to Bed - Progress: Met Pt will Ambulate: 51 - 150 feet;with modified independence;with least  restrictive assistive device PT Goal: Ambulate - Progress: Progressing toward goal Pt will Go Up / Down Stairs: 1-2 stairs;with modified independence;with rail(s);with least restrictive assistive device PT Goal: Up/Down Stairs -  Progress: Progressing toward goal  Visit Information  Last PT Received On: 03/27/12 Assistance Needed: +1    Subjective Data  Subjective: My foot doesn't hurt, only my abdominal incision Patient Stated Goal: return home   Cognition  Overall Cognitive Status: Appears within functional limits for tasks assessed/performed Arousal/Alertness: Awake/alert Orientation Level: Appears intact for tasks assessed Behavior During Session: [facility] for tasks performed    Balance  Balance Balance Assessed: Yes Dynamic Standing Balance Dynamic Standing - Balance Support: Bilateral upper extremity supported;During functional activity Dynamic Standing - Level of Assistance: 5: Stand by assistance  End of Session PT - End of Session Equipment Utilized During Treatment: Gait belt;Other (comment) (Darco) Activity Tolerance: Patient tolerated treatment well Patient left: in chair;with call bell/phone within reach Nurse Communication: Mobility status   GP   Lyanne Co, PT  Acute Rehab Services  [phone]   Lyanne Co 03/27/2012, 9:51 AM

## 2012-03-27 NOTE — Progress Notes (Signed)
CSW consult for SNF. PT/OT recommendation for home with [facility] noted. Also noted pt ambulating 129ft with Min Assist and pt's dtr able to assist pt with stairs. CSW signing off as no other CSW needs identified at this time. Please re-consult if SNF needed.  Dellie Burns, MSW, LCSWA [phone] (Weekends 8:00am-4:30pm)

## 2012-03-27 NOTE — Progress Notes (Addendum)
[facility] [place]  Progress Note Bypass Surgery  Date of Surgery: 03/23/2012  Procedure(s): Right BYPASS GRAFT FEMORAL-POPLITEAL ARTERY AMPUTATION DIGIT - right great toe ENDARTERECTOMY FEMORAL Surgeon: Surgeon(s): Sherren Kerns, MD  4 Days Post-Op  History of Present Illness  Cynthia Ray is a 78 y.o. female who is ambulating with Darco shoe. Has walker at home. ? Wether pt can go home vs Rehab. Pt requests [facility] for rehab. . Patients pain is well controlled.    VASC. LAB Studies:        ABI: Right 0.78;  Left 0.82;   Imaging: No results found.  Significant Diagnostic Studies: CBC Lab Results  Component Value Date   WBC 6.7 03/26/2012   HGB 9.5* 03/26/2012   HCT 28.3* 03/26/2012   MCV 88.2 03/26/2012   PLT 160 03/26/2012    BMET     Component Value Date/Time   NA 130* 03/26/2012 0630   K 4.8 03/26/2012 0630   CL 96 03/26/2012 0630   CO2 27 03/26/2012 0630   GLUCOSE 179* 03/26/2012 0630   BUN 15 03/26/2012 0630   CREATININE 1.01 03/26/2012 0630   CALCIUM 8.5 03/26/2012 0630   GFRNONAA 50* 03/26/2012 0630   GFRAA 58* 03/26/2012 0630    COAG Lab Results  Component Value Date   INR 1.08 03/17/2012   INR 1.03 01/19/2012   No results found for this basename: PTT    Physical Examination  BP Readings from Last 3 Encounters:  03/27/12 147/73  03/27/12 147/73  03/17/12 151/69   Temp Readings from Last 3 Encounters:  03/27/12 99.2 F (37.3 C) Oral  03/27/12 99.2 F (37.3 C) Oral  03/17/12 98.7 F (37.1 C)    SpO2 Readings from Last 3 Encounters:  03/27/12 93%  03/27/12 93%  03/17/12 95%   Pulse Readings from Last 3 Encounters:  03/27/12 80  03/27/12 80  03/17/12 80    Pt is A&O x 3 right lower extremity: Incision/s is/are clean,dry.intact, and  healing without hematoma, erythema or drainage Limb is warm; with good color AMP site dry  Assessment/Plan: Pt. Doing well Post-op pain is controlled Wounds are healing  well PT/OT for ambulation Continue wound care as ordered POss DC Home today/tomorrow if pt family can accomodate [facility] PT/OT vs SNF  [name] [phone] 03/27/2012 8:02 AM  Addendum  I have independently interviewed and examined the patient, and I agree with the physician assistant's findings.  Incision c/d/i, warm foot.  ABI noted: improved postop.  Dispo dependent on family.  Leonides Sake, MD [facility] [place] Office: [phone] Pager: [phone]  03/27/2012, 9:02 AM

## 2012-03-27 NOTE — Progress Notes (Signed)
03/27/2012 1600 Spoke to daughter, and from [facility] list. They requested [facility]. Faxed orders to [facility]. Pt scheduled for d/c on 9/2.  Requested a start of care date from [facility]. States no DME is needed at this time. Left message for MD to clarify [facility] RN order for daily dressing changes if needed. Will need F2F for [facility] RN also. Isidoro Donning RN CCM Case Mgmt phone [phone]

## 2012-03-28 LAB — GLUCOSE, CAPILLARY
Glucose-Capillary: 103 mg/dL — ABNORMAL HIGH (ref 70–99)
Glucose-Capillary: 189 mg/dL — ABNORMAL HIGH (ref 70–99)
Glucose-Capillary: 126 mg/dL — ABNORMAL HIGH (ref 70–99)
Glucose-Capillary: 101 mg/dL — ABNORMAL HIGH (ref 70–99)

## 2012-03-28 NOTE — Progress Notes (Addendum)
[facility] [place]  Progress Note Bypass Surgery  Date of Surgery: 03/23/2012  Procedure(s): Right BYPASS GRAFT FEMORAL-POPLITEAL ARTERY AMPUTATION DIGIT ENDARTERECTOMY FEMORAL Surgeon: Surgeon(s): Sherren Kerns, MD  5 Days Post-Op  History of Present Illness  Cynthia Ray is a 78 y.o. female who is doing well s/p bypass. Ambulated with PT and did some stairs yesterday.   Significant Diagnostic Studies: CBC Lab Results  Component Value Date   WBC 6.7 03/26/2012   HGB 9.5* 03/26/2012   HCT 28.3* 03/26/2012   MCV 88.2 03/26/2012   PLT 160 03/26/2012    BMET     Component Value Date/Time   NA 130* 03/26/2012 0630   K 4.8 03/26/2012 0630   CL 96 03/26/2012 0630   CO2 27 03/26/2012 0630   GLUCOSE 179* 03/26/2012 0630   BUN 15 03/26/2012 0630   CREATININE 1.01 03/26/2012 0630   CALCIUM 8.5 03/26/2012 0630   GFRNONAA 50* 03/26/2012 0630   GFRAA 58* 03/26/2012 0630    COAG Lab Results  Component Value Date   INR 1.08 03/17/2012   INR 1.03 01/19/2012   No results found for this basename: PTT    Physical Examination  BP Readings from Last 3 Encounters:  03/28/12 121/49  03/28/12 121/49  03/17/12 151/69   Temp Readings from Last 3 Encounters:  03/28/12 98.4 F (36.9 C) Oral  03/28/12 98.4 F (36.9 C) Oral  03/17/12 98.7 F (37.1 C)    SpO2 Readings from Last 3 Encounters:  03/28/12 95%  03/28/12 95%  03/17/12 95%   Pulse Readings from Last 3 Encounters:  03/28/12 82  03/28/12 82  03/17/12 80    Pt is A&O x 3 right lower extremity: Incision/s is/are clean,dry.intact, and  healing without hematoma, erythema or drainage Limb is warm; with good color Foot warm AMP site clean and dry - dressing intact Mod swelling but calf and thigh soft  Assessment/Plan: Pt. Doing well Post-op pain is controlled Wounds are healing well PT/OT for ambulation Continue wound care as ordered Home tomorrow with [facility] PT/OT and RN  [name] [phone] 03/28/2012 7:58 AM  Addendum  I have independently interviewed and examined the patient, and I agree with the physician assistant's findings.  Home health services scheduled to start tomorrow, so D/C tomorrow.  Leonides Sake, MD [facility] [place] Office: [phone] Pager: [phone]  03/28/2012, 9:50 AM

## 2012-03-29 LAB — GLUCOSE, CAPILLARY
Glucose-Capillary: 73 mg/dL (ref 70–99)
Glucose-Capillary: 142 mg/dL — ABNORMAL HIGH (ref 70–99)
Glucose-Capillary: 196 mg/dL — ABNORMAL HIGH (ref 70–99)

## 2012-03-29 NOTE — Progress Notes (Addendum)
[facility] [place]  Progress Note Bypass Surgery  Date of Surgery: 03/23/2012  Procedure(s): right BYPASS GRAFT FEMORAL-POPLITEAL ARTERY AMPUTATION DIGIT ENDARTERECTOMY FEMORAL Surgeon: Surgeon(s): Sherren Kerns, MD  6 Days Post-Op  History of Present Illness  Cynthia Ray is a 78 y.o. female who is doing well a/p bypass. Ready to go home.    Significant Diagnostic Studies: CBC Lab Results  Component Value Date   WBC 6.7 03/26/2012   HGB 9.5* 03/26/2012   HCT 28.3* 03/26/2012   MCV 88.2 03/26/2012   PLT 160 03/26/2012    BMET     Component Value Date/Time   NA 130* 03/26/2012 0630   K 4.8 03/26/2012 0630   CL 96 03/26/2012 0630   CO2 27 03/26/2012 0630   GLUCOSE 179* 03/26/2012 0630   BUN 15 03/26/2012 0630   CREATININE 1.01 03/26/2012 0630   CALCIUM 8.5 03/26/2012 0630   GFRNONAA 50* 03/26/2012 0630   GFRAA 58* 03/26/2012 0630    COAG Lab Results  Component Value Date   INR 1.08 03/17/2012   INR 1.03 01/19/2012   No results found for this basename: PTT    Physical Examination  BP Readings from Last 3 Encounters:  03/29/12 124/67  03/29/12 124/67  03/17/12 151/69   Temp Readings from Last 3 Encounters:  03/29/12 98.2 F (36.8 C) Oral  03/29/12 98.2 F (36.8 C) Oral  03/17/12 98.7 F (37.1 C)    SpO2 Readings from Last 3 Encounters:  03/29/12 94%  03/29/12 94%  03/17/12 95%   Pulse Readings from Last 3 Encounters:  03/29/12 86  03/29/12 86  03/17/12 80    Pt is A&O x 3 right lower extremity: Incision/s is/are clean,dry.intact, and  healing without hematoma, erythema or drainage Limb is warm; with good color Right great toe amp site looks good. Healing nicely Some bruising on dorsum of foot.  Assessment/Plan: Pt. Doing well Post-op pain is controlled Wounds are healing well HH PT/OT for ambulation [facility] RN wound care - dressing changes 3 x/wk to right gr toe amp  ROCZNIAK,REGINA J [phone] 03/29/2012 8:04  AM  Addendum  I have independently interviewed and examined the patient, and I agree with the physician assistant's findings.  Ok to go home.  Home service reported arranged for today.  Leonides Sake, MD [facility] [place] Office: [phone] Pager: [phone]  03/29/2012, 8:54 AM

## 2012-03-29 NOTE — Discharge Summary (Signed)
[facility] Discharge Summary   Patient ID:  Cynthia Ray MRN: [id number] DOB/AGE: 03/14/1926 78 y.o.  Admit date: 03/23/2012 Discharge date: 03/29/2012 Date of Surgery: 03/23/2012 Surgeon: Surgeon(s): Sherren Kerns, MD  Admission Diagnosis: CLAUDICATION  Discharge Diagnoses:  CLAUDICATION  Secondary Diagnoses: Past Medical History  Diagnosis Date  . Hyperlipidemia   . Hypertension   . Carotid artery occlusion   . PVD (peripheral vascular disease)     Stents left SFA 2004 Dr. Samule Ohm  . Anemia   . GERD (gastroesophageal reflux disease)   . Type II diabetes mellitus   . History of blood transfusion 11/11/11  . Arthritis     "dr said I have it in my back"    Procedure(s): BYPASS GRAFT FEMORAL-POPLITEAL ARTERY AMPUTATION DIGIT ENDARTERECTOMY FEMORAL  Discharged Condition: good  HPI:  Patient is a 78 y.o. year old female who presents for evaluation of a nonhealing toe ulcer right first toe. The toe had a cyst which was excised and did not heal. This was in November 2012. She has no other prior episodes. Other medical problems include a history of PAD with prior left superficial femoral artery stenting by Dr. Geralynn Rile in 2004. She also has history of hyperlipidemia hypertension diabetes and a known carotid stenosis. These are all currently stable. She recently had an arteriogram which showed a right superficial femoral artery occlusion with one-vessel runoff via the peroneal. She had preoperative cardiac risk stratification by Dr. Antoine Poche. This showed a positive stress test. She was found to have a 60% LAD lesion. She also had a 95% right coronary lesion which was stented. She was given several weeks on Plavix for her stent area to heal. She returns today for further followup. She states that the toe has continued to have poor healing. She denies any fever or chills. She also had anemia previously and states that she thinks this is currently controlled. Her  primary care physician is Dr. Olena Leatherwood. Pt was admitted for right fem-pop bypass and great toe amputation.  Hospital Course:  Cynthia Ray is a 77 y.o. female is S/P Right Procedure(s): BYPASS GRAFT FEMORAL-POPLITEAL ARTERY AMPUTATION DIGIT ENDARTERECTOMY FEMORAL Extubated: POD # 0 Post-op wounds healing well, great toe amp healing well Pt. Ambulating, voiding and taking PO diet without difficulty. Pt pain controlled with PO pain meds. Labs as below Complications:none  Consults:     Significant Diagnostic Studies: CBC Lab Results  Component Value Date   WBC 6.7 03/26/2012   HGB 9.5* 03/26/2012   HCT 28.3* 03/26/2012   MCV 88.2 03/26/2012   PLT 160 03/26/2012    BMET    Component Value Date/Time   NA 130* 03/26/2012 0630   K 4.8 03/26/2012 0630   CL 96 03/26/2012 0630   CO2 27 03/26/2012 0630   GLUCOSE 179* 03/26/2012 0630   BUN 15 03/26/2012 0630   CREATININE 1.01 03/26/2012 0630   CALCIUM 8.5 03/26/2012 0630   GFRNONAA 50* 03/26/2012 0630   GFRAA 58* 03/26/2012 0630   COAG Lab Results  Component Value Date   INR 1.08 03/17/2012   INR 1.03 01/19/2012     Disposition:  Discharge to :Home with [facility] PT/OT/RN Discharge Orders    Future Appointments: Provider: Department: Dept Phone: Center:   04/15/2012 9:00 AM Sherren Kerns, MD [facility] [phone] [facility]     Future Orders Please Complete By Expires   Resume previous diet      Driving Restrictions      Comments:  No driving for 3 weeks   Lifting restrictions      Comments:   No lifting for 4 weeks   Call MD for:  temperature >100.5      Call MD for:  redness, tenderness, or signs of infection (pain, swelling, bleeding, redness, odor or green/yellow discharge around incision site)      Call MD for:  severe or increased pain, loss or decreased feeling  in affected limb(s)      Increase activity slowly      Comments:   Walk with assistance use walker or cane as needed   May shower       may wash over wound  with mild soap and water         [place], [place]  Home Medication Instructions [id number]   Printed on:03/29/12 0808  Medication Information                    atorvastatin (LIPITOR) 80 MG tablet Take 80 mg by mouth daily.           L-Methylfolate-B6-B12 (METANX PO) Take 1 tablet by mouth daily.            lisinopril-hydrochlorothiazide (PRINZIDE,ZESTORETIC) 10-12.5 MG per tablet Take 1 tablet by mouth daily.           glipiZIDE (GLUCOTROL) 5 MG tablet Take 5 mg by mouth 2 (two) times daily before a meal.           IRON PO Take by mouth 2 (two) times daily.           labetalol (NORMODYNE) 300 MG tablet Take 300 mg by mouth 2 (two) times daily.           metFORMIN (GLUCOPHAGE) 500 MG tablet Take 500 mg by mouth 3 (three) times daily.           aspirin EC 81 MG EC tablet Take 1 tablet (81 mg total) by mouth daily.           clopidogrel (PLAVIX) 75 MG tablet Take 1 tablet (75 mg total) by mouth daily with breakfast.           nitroGLYCERIN (NITROSTAT) 0.4 MG SL tablet Place 1 tablet (0.4 mg total) under the tongue every 5 (five) minutes as needed for chest pain.           traMADol (ULTRAM) 50 MG tablet Take 50 mg by mouth 2 (two) times daily.            acetaminophen (TYLENOL) 500 MG tablet Take 1,000 mg by mouth every 6 (six) hours as needed.           oxyCODONE-acetaminophen (PERCOCET/ROXICET) 5-325 MG per tablet Take 1-2 tablets by mouth every 4 (four) hours as needed.            Verbal and written Discharge instructions given to the patient. Wound care per Discharge AVS Follow-up Information    Follow up with Sherren Kerns, MD in 3 weeks. (office will arrange-sent)    Contact information:   [address] [place] [address] [phone]       Follow up with [facility]. [facility] RN, Physical Therapy and Occupational Therapy)    Contact information:   [phone]         Signed: [name] 03/29/2012, 8:08  AM

## 2012-03-29 NOTE — Progress Notes (Signed)
I contacted [facility] and they will be bringing rolling walker to patient room.

## 2012-03-29 NOTE — Progress Notes (Signed)
Physical Therapy Treatment Patient Details Name: Cynthia Ray MRN: [id number] DOB: [birth year] Today's Date: 03/29/2012 Time: 5284-1324 PT Time Calculation (min): 15 min  PT Assessment / Plan / Recommendation Comments on Treatment Session  Pt s/p RLE fempop BPG and great toe amputation. Pt progressing well with ambulation and able to initiate donning shoe but difficulty holding shoe and placing intial strap. Pt encouraged to have family assist with shoe donning prior to leaving for work and to continue to wear shoe on LLE to assist with height difference. Pt very pleasant and agrees that RW would be most benefitcial at home. Will continue to follow.    Follow Up Recommendations       Barriers to Discharge        Equipment Recommendations  3 in 1 bedside comode;Rolling walker with 5" wheels    Recommendations for Other Services    Frequency     Plan Discharge plan remains appropriate;Frequency remains appropriate    Precautions / Restrictions Precautions Precautions: Fall Required Braces or Orthoses: Other Brace/Splint Other Brace/Splint: Darco right   Pertinent Vitals/Pain No pain    Mobility  Bed Mobility Bed Mobility: Not assessed Transfers Sit to Stand: From bed;5: Supervision Stand to Sit: 5: Supervision;To chair/3-in-1;6: Modified independent (Device/Increase time) Details for Transfer Assistance: cueing for hand placement to stand Ambulation/Gait Ambulation/Gait Assistance: 5: Supervision Ambulation Distance (Feet): 350 Feet Assistive device: Rolling walker Ambulation/Gait Assistance Details: cueing to step into RW, look up and to slow down. Pt virtually running down the hall the first half of ambulation and stated she doesn't normally walked that fast and cues for controlled gait at normal pace. Pt maintaining heel weight bearing  and no significant LOB. 2 standing rest breaks of less than 20 sec each Gait Pattern: Step-through pattern;Trunk flexed Gait velocity:  quick Stairs: No    Exercises General Exercises - Lower Extremity Long Arc Quad: AROM;Right;20 reps;Seated Hip Flexion/Marching: AROM;Right;Seated;20 reps   PT Diagnosis:    PT Problem List:   PT Treatment Interventions:     PT Goals Acute Rehab PT Goals PT Transfer Goal: Bed to Chair/Chair to Bed - Progress: Progressing toward goal PT Goal: Ambulate - Progress: Progressing toward goal  Visit Information  Last PT Received On: 03/29/12 Assistance Needed: +1    Subjective Data  Subjective: I have to remember to look up   Cognition  Overall Cognitive Status: Appears within functional limits for tasks assessed/performed Arousal/Alertness: Awake/alert Orientation Level: Appears intact for tasks assessed Behavior During Session: [facility] for tasks performed    Balance     End of Session PT - End of Session Equipment Utilized During Treatment: Other (comment) (Darco) Activity Tolerance: Patient tolerated treatment well Patient left: in chair;with call bell/phone within reach;with family/visitor present Nurse Communication: Mobility status   GP     Delorse Lek 03/29/2012, 11:43 AM Delaney Meigs, PT [phone]

## 2012-03-29 NOTE — Progress Notes (Addendum)
Blood sugar 73 this AM. Asymptomatic but gave snacks. Will recheck again and continue to monitor. Rechecked BS 142.

## 2012-03-31 DIAGNOSIS — I739 Peripheral vascular disease, unspecified: Secondary | ICD-10-CM | POA: Diagnosis not present

## 2012-03-31 DIAGNOSIS — I10 Essential (primary) hypertension: Secondary | ICD-10-CM | POA: Diagnosis not present

## 2012-03-31 DIAGNOSIS — M159 Polyosteoarthritis, unspecified: Secondary | ICD-10-CM | POA: Diagnosis not present

## 2012-03-31 DIAGNOSIS — Z4789 Encounter for other orthopedic aftercare: Secondary | ICD-10-CM | POA: Diagnosis not present

## 2012-03-31 DIAGNOSIS — I2510 Atherosclerotic heart disease of native coronary artery without angina pectoris: Secondary | ICD-10-CM | POA: Diagnosis not present

## 2012-03-31 DIAGNOSIS — E119 Type 2 diabetes mellitus without complications: Secondary | ICD-10-CM | POA: Diagnosis not present

## 2012-04-02 DIAGNOSIS — Z4789 Encounter for other orthopedic aftercare: Secondary | ICD-10-CM | POA: Diagnosis not present

## 2012-04-02 DIAGNOSIS — E119 Type 2 diabetes mellitus without complications: Secondary | ICD-10-CM | POA: Diagnosis not present

## 2012-04-02 DIAGNOSIS — I10 Essential (primary) hypertension: Secondary | ICD-10-CM | POA: Diagnosis not present

## 2012-04-02 DIAGNOSIS — I2510 Atherosclerotic heart disease of native coronary artery without angina pectoris: Secondary | ICD-10-CM | POA: Diagnosis not present

## 2012-04-02 DIAGNOSIS — I739 Peripheral vascular disease, unspecified: Secondary | ICD-10-CM | POA: Diagnosis not present

## 2012-04-02 DIAGNOSIS — M159 Polyosteoarthritis, unspecified: Secondary | ICD-10-CM | POA: Diagnosis not present

## 2012-04-05 DIAGNOSIS — I10 Essential (primary) hypertension: Secondary | ICD-10-CM | POA: Diagnosis not present

## 2012-04-05 DIAGNOSIS — E119 Type 2 diabetes mellitus without complications: Secondary | ICD-10-CM | POA: Diagnosis not present

## 2012-04-05 DIAGNOSIS — I739 Peripheral vascular disease, unspecified: Secondary | ICD-10-CM | POA: Diagnosis not present

## 2012-04-05 DIAGNOSIS — Z4789 Encounter for other orthopedic aftercare: Secondary | ICD-10-CM | POA: Diagnosis not present

## 2012-04-05 DIAGNOSIS — M159 Polyosteoarthritis, unspecified: Secondary | ICD-10-CM | POA: Diagnosis not present

## 2012-04-05 DIAGNOSIS — I2510 Atherosclerotic heart disease of native coronary artery without angina pectoris: Secondary | ICD-10-CM | POA: Diagnosis not present

## 2012-04-06 DIAGNOSIS — Z4789 Encounter for other orthopedic aftercare: Secondary | ICD-10-CM | POA: Diagnosis not present

## 2012-04-06 DIAGNOSIS — I2510 Atherosclerotic heart disease of native coronary artery without angina pectoris: Secondary | ICD-10-CM | POA: Diagnosis not present

## 2012-04-06 DIAGNOSIS — M159 Polyosteoarthritis, unspecified: Secondary | ICD-10-CM | POA: Diagnosis not present

## 2012-04-06 DIAGNOSIS — I10 Essential (primary) hypertension: Secondary | ICD-10-CM | POA: Diagnosis not present

## 2012-04-06 DIAGNOSIS — E119 Type 2 diabetes mellitus without complications: Secondary | ICD-10-CM | POA: Diagnosis not present

## 2012-04-06 DIAGNOSIS — I739 Peripheral vascular disease, unspecified: Secondary | ICD-10-CM | POA: Diagnosis not present

## 2012-04-07 DIAGNOSIS — I739 Peripheral vascular disease, unspecified: Secondary | ICD-10-CM | POA: Diagnosis not present

## 2012-04-07 DIAGNOSIS — I10 Essential (primary) hypertension: Secondary | ICD-10-CM | POA: Diagnosis not present

## 2012-04-07 DIAGNOSIS — Z4789 Encounter for other orthopedic aftercare: Secondary | ICD-10-CM | POA: Diagnosis not present

## 2012-04-07 DIAGNOSIS — I2510 Atherosclerotic heart disease of native coronary artery without angina pectoris: Secondary | ICD-10-CM | POA: Diagnosis not present

## 2012-04-07 DIAGNOSIS — E119 Type 2 diabetes mellitus without complications: Secondary | ICD-10-CM | POA: Diagnosis not present

## 2012-04-07 DIAGNOSIS — M159 Polyosteoarthritis, unspecified: Secondary | ICD-10-CM | POA: Diagnosis not present

## 2012-04-08 DIAGNOSIS — E119 Type 2 diabetes mellitus without complications: Secondary | ICD-10-CM | POA: Diagnosis not present

## 2012-04-08 DIAGNOSIS — I2510 Atherosclerotic heart disease of native coronary artery without angina pectoris: Secondary | ICD-10-CM | POA: Diagnosis not present

## 2012-04-08 DIAGNOSIS — I10 Essential (primary) hypertension: Secondary | ICD-10-CM | POA: Diagnosis not present

## 2012-04-08 DIAGNOSIS — M159 Polyosteoarthritis, unspecified: Secondary | ICD-10-CM | POA: Diagnosis not present

## 2012-04-08 DIAGNOSIS — I739 Peripheral vascular disease, unspecified: Secondary | ICD-10-CM | POA: Diagnosis not present

## 2012-04-08 DIAGNOSIS — Z4789 Encounter for other orthopedic aftercare: Secondary | ICD-10-CM | POA: Diagnosis not present

## 2012-04-12 DIAGNOSIS — I10 Essential (primary) hypertension: Secondary | ICD-10-CM | POA: Diagnosis not present

## 2012-04-12 DIAGNOSIS — M159 Polyosteoarthritis, unspecified: Secondary | ICD-10-CM | POA: Diagnosis not present

## 2012-04-12 DIAGNOSIS — Z4789 Encounter for other orthopedic aftercare: Secondary | ICD-10-CM | POA: Diagnosis not present

## 2012-04-12 DIAGNOSIS — I2510 Atherosclerotic heart disease of native coronary artery without angina pectoris: Secondary | ICD-10-CM | POA: Diagnosis not present

## 2012-04-12 DIAGNOSIS — E119 Type 2 diabetes mellitus without complications: Secondary | ICD-10-CM | POA: Diagnosis not present

## 2012-04-12 DIAGNOSIS — I739 Peripheral vascular disease, unspecified: Secondary | ICD-10-CM | POA: Diagnosis not present

## 2012-04-13 DIAGNOSIS — M159 Polyosteoarthritis, unspecified: Secondary | ICD-10-CM | POA: Diagnosis not present

## 2012-04-13 DIAGNOSIS — E119 Type 2 diabetes mellitus without complications: Secondary | ICD-10-CM | POA: Diagnosis not present

## 2012-04-13 DIAGNOSIS — Z4789 Encounter for other orthopedic aftercare: Secondary | ICD-10-CM | POA: Diagnosis not present

## 2012-04-13 DIAGNOSIS — I739 Peripheral vascular disease, unspecified: Secondary | ICD-10-CM | POA: Diagnosis not present

## 2012-04-13 DIAGNOSIS — I10 Essential (primary) hypertension: Secondary | ICD-10-CM | POA: Diagnosis not present

## 2012-04-13 DIAGNOSIS — I2510 Atherosclerotic heart disease of native coronary artery without angina pectoris: Secondary | ICD-10-CM | POA: Diagnosis not present

## 2012-04-14 ENCOUNTER — Encounter: Payer: Self-pay | Admitting: Vascular Surgery

## 2012-04-15 ENCOUNTER — Encounter: Payer: Self-pay | Admitting: Vascular Surgery

## 2012-04-15 ENCOUNTER — Ambulatory Visit (INDEPENDENT_AMBULATORY_CARE_PROVIDER_SITE_OTHER): Payer: Medicare Other | Admitting: Vascular Surgery

## 2012-04-15 VITALS — BP 156/59 | HR 85 | Temp 98.1°F | Ht 62.0 in | Wt 145.0 lb

## 2012-04-15 DIAGNOSIS — Z48812 Encounter for surgical aftercare following surgery on the circulatory system: Secondary | ICD-10-CM

## 2012-04-15 DIAGNOSIS — L98499 Non-pressure chronic ulcer of skin of other sites with unspecified severity: Secondary | ICD-10-CM

## 2012-04-15 DIAGNOSIS — I739 Peripheral vascular disease, unspecified: Secondary | ICD-10-CM

## 2012-04-15 NOTE — Progress Notes (Signed)
Patient is an 77 year old female who recently underwent right femoral endarterectomy with right femoral to below-knee popliteal bypass with vein. This was done for a nonhealing wound of her right first toe. Her operation was August 27. She returns today for followup. She has been ambulating with a walker. She denies any drainage from any of her incisions.  Physical exam: Filed Vitals:   04/15/12 0901  BP: 156/59  Pulse: 85  Temp: 98.1 F (36.7 C)  TempSrc: Oral  Height: 5\' 2"  (1.575 m)  Weight: 145 lb (65.772 kg)  SpO2: 100%   Right lower extremity: The right groin incision has a 2-3 cm palpable mass with no erythema or drainage. This is slightly fluctuant. The right foot is pink warm and well-perfused. Right first toe indication is well-healed. All the other incisions in her right leg are well healed.  Assessment: Patent right femoral to below-knee popliteal bypass with healed right toe amputation. She probably has a small fluid collection in the right groin which I would suspect is a seroma since there is no evidence of infection. We will observe this for now. If she has increasing drainage from the right groin or the fluid collection continues to get larger we may need to drain this or aspirate it.  Plan: Followup graft surveillance protocol in 2 months  [name], MD [facility] [place] Office: [phone] Pager: [phone]

## 2012-04-15 NOTE — Addendum Note (Signed)
Addended by: Sharee Pimple on: 04/15/2012 10:54 AM   Modules accepted: Orders

## 2012-04-20 DIAGNOSIS — E119 Type 2 diabetes mellitus without complications: Secondary | ICD-10-CM | POA: Diagnosis not present

## 2012-04-20 DIAGNOSIS — I2510 Atherosclerotic heart disease of native coronary artery without angina pectoris: Secondary | ICD-10-CM | POA: Diagnosis not present

## 2012-04-20 DIAGNOSIS — Z4789 Encounter for other orthopedic aftercare: Secondary | ICD-10-CM | POA: Diagnosis not present

## 2012-04-20 DIAGNOSIS — M159 Polyosteoarthritis, unspecified: Secondary | ICD-10-CM | POA: Diagnosis not present

## 2012-04-20 DIAGNOSIS — I739 Peripheral vascular disease, unspecified: Secondary | ICD-10-CM | POA: Diagnosis not present

## 2012-04-20 DIAGNOSIS — I10 Essential (primary) hypertension: Secondary | ICD-10-CM | POA: Diagnosis not present

## 2012-04-22 DIAGNOSIS — Z4789 Encounter for other orthopedic aftercare: Secondary | ICD-10-CM | POA: Diagnosis not present

## 2012-04-22 DIAGNOSIS — I739 Peripheral vascular disease, unspecified: Secondary | ICD-10-CM | POA: Diagnosis not present

## 2012-04-22 DIAGNOSIS — E119 Type 2 diabetes mellitus without complications: Secondary | ICD-10-CM | POA: Diagnosis not present

## 2012-04-22 DIAGNOSIS — M159 Polyosteoarthritis, unspecified: Secondary | ICD-10-CM | POA: Diagnosis not present

## 2012-04-22 DIAGNOSIS — I2510 Atherosclerotic heart disease of native coronary artery without angina pectoris: Secondary | ICD-10-CM | POA: Diagnosis not present

## 2012-04-22 DIAGNOSIS — I10 Essential (primary) hypertension: Secondary | ICD-10-CM | POA: Diagnosis not present

## 2012-04-27 DIAGNOSIS — E119 Type 2 diabetes mellitus without complications: Secondary | ICD-10-CM | POA: Diagnosis not present

## 2012-04-27 DIAGNOSIS — I2510 Atherosclerotic heart disease of native coronary artery without angina pectoris: Secondary | ICD-10-CM | POA: Diagnosis not present

## 2012-04-27 DIAGNOSIS — M159 Polyosteoarthritis, unspecified: Secondary | ICD-10-CM | POA: Diagnosis not present

## 2012-04-27 DIAGNOSIS — I10 Essential (primary) hypertension: Secondary | ICD-10-CM | POA: Diagnosis not present

## 2012-04-27 DIAGNOSIS — Z4789 Encounter for other orthopedic aftercare: Secondary | ICD-10-CM | POA: Diagnosis not present

## 2012-04-27 DIAGNOSIS — I739 Peripheral vascular disease, unspecified: Secondary | ICD-10-CM | POA: Diagnosis not present

## 2012-05-05 DIAGNOSIS — E119 Type 2 diabetes mellitus without complications: Secondary | ICD-10-CM | POA: Diagnosis not present

## 2012-05-05 DIAGNOSIS — I739 Peripheral vascular disease, unspecified: Secondary | ICD-10-CM | POA: Diagnosis not present

## 2012-05-05 DIAGNOSIS — I10 Essential (primary) hypertension: Secondary | ICD-10-CM | POA: Diagnosis not present

## 2012-05-05 DIAGNOSIS — Z4789 Encounter for other orthopedic aftercare: Secondary | ICD-10-CM | POA: Diagnosis not present

## 2012-05-05 DIAGNOSIS — I2510 Atherosclerotic heart disease of native coronary artery without angina pectoris: Secondary | ICD-10-CM | POA: Diagnosis not present

## 2012-05-05 DIAGNOSIS — M159 Polyosteoarthritis, unspecified: Secondary | ICD-10-CM | POA: Diagnosis not present

## 2012-05-28 DIAGNOSIS — I739 Peripheral vascular disease, unspecified: Secondary | ICD-10-CM | POA: Diagnosis not present

## 2012-05-28 DIAGNOSIS — I2510 Atherosclerotic heart disease of native coronary artery without angina pectoris: Secondary | ICD-10-CM | POA: Diagnosis not present

## 2012-05-28 DIAGNOSIS — Z4789 Encounter for other orthopedic aftercare: Secondary | ICD-10-CM | POA: Diagnosis not present

## 2012-05-28 DIAGNOSIS — E119 Type 2 diabetes mellitus without complications: Secondary | ICD-10-CM | POA: Diagnosis not present

## 2012-05-28 DIAGNOSIS — M159 Polyosteoarthritis, unspecified: Secondary | ICD-10-CM | POA: Diagnosis not present

## 2012-05-28 DIAGNOSIS — I10 Essential (primary) hypertension: Secondary | ICD-10-CM | POA: Diagnosis not present

## 2012-06-01 DIAGNOSIS — I10 Essential (primary) hypertension: Secondary | ICD-10-CM | POA: Diagnosis not present

## 2012-06-16 ENCOUNTER — Other Ambulatory Visit: Payer: Self-pay | Admitting: *Deleted

## 2012-06-16 DIAGNOSIS — I6529 Occlusion and stenosis of unspecified carotid artery: Secondary | ICD-10-CM

## 2012-06-22 ENCOUNTER — Encounter: Payer: Self-pay | Admitting: Neurosurgery

## 2012-06-23 ENCOUNTER — Encounter (INDEPENDENT_AMBULATORY_CARE_PROVIDER_SITE_OTHER): Payer: Medicare Other | Admitting: *Deleted

## 2012-06-23 ENCOUNTER — Ambulatory Visit (INDEPENDENT_AMBULATORY_CARE_PROVIDER_SITE_OTHER): Payer: Medicare Other | Admitting: Neurosurgery

## 2012-06-23 ENCOUNTER — Other Ambulatory Visit (INDEPENDENT_AMBULATORY_CARE_PROVIDER_SITE_OTHER): Payer: Medicare Other | Admitting: *Deleted

## 2012-06-23 ENCOUNTER — Encounter: Payer: Self-pay | Admitting: Neurosurgery

## 2012-06-23 VITALS — BP 155/58 | HR 82 | Ht 62.0 in | Wt 138.0 lb

## 2012-06-23 DIAGNOSIS — I6529 Occlusion and stenosis of unspecified carotid artery: Secondary | ICD-10-CM

## 2012-06-23 DIAGNOSIS — I739 Peripheral vascular disease, unspecified: Secondary | ICD-10-CM

## 2012-06-23 DIAGNOSIS — Z48812 Encounter for surgical aftercare following surgery on the circulatory system: Secondary | ICD-10-CM

## 2012-06-23 DIAGNOSIS — L98499 Non-pressure chronic ulcer of skin of other sites with unspecified severity: Secondary | ICD-10-CM

## 2012-06-23 DIAGNOSIS — I70219 Atherosclerosis of native arteries of extremities with intermittent claudication, unspecified extremity: Secondary | ICD-10-CM | POA: Diagnosis not present

## 2012-06-23 MED ORDER — CEPHALEXIN 500 MG PO CAPS: 500.0000 mg | ORAL_CAPSULE | Freq: Four times a day (QID) | ORAL | Status: DC

## 2012-06-23 NOTE — Progress Notes (Signed)
[facility] [place] PAD/PVD Office Note  CC: Three-month followup status post right femoral endarterectomy with right femoropopliteal bypass Referring Physician: Fields  History of Present Illness: 78 year old female patient of Dr. Darrick Penna who underwent a right femoral endarterectomy with right femoropopliteal bypass in September of 2013. She also had a right great toe amputation the same time. The toe is not bothering her however she does have pain at the second incision of her right thigh. Her daughter states this has been going on for about 3 weeks. The patient is ambulating with a walker and otherwise doing well.  Past Medical History  Diagnosis Date  . Hyperlipidemia   . Hypertension   . Carotid artery occlusion   . PVD (peripheral vascular disease)     Stents left SFA 2004 Dr. Samule Ohm  . Anemia   . GERD (gastroesophageal reflux disease)   . Type II diabetes mellitus   . History of blood transfusion 11/11/11  . Arthritis     "dr said I have it in my back"    ROS: [x]  Positive   [ ]  Denies    General: [ ]  Weight loss, [ ]  Fever, [ ]  chills Neurologic: [ ]  Dizziness, [ ]  Blackouts, [ ]  Seizure [ ]  Stroke, [ ]  "Mini stroke", [ ]  Slurred speech, [ ]  Temporary blindness; [ ]  weakness in arms or legs, [ ]  Hoarseness Cardiac: [ ]  Chest pain/pressure, [ ]  Shortness of breath at rest [ ]  Shortness of breath with exertion, [ ]  Atrial fibrillation or irregular heartbeat Vascular: [ ]  Pain in legs with walking, [ ]  Pain in legs at rest, [ ]  Pain in legs at night,  [ ]  Non-healing ulcer, [ ]  Blood clot in vein/DVT,   Pulmonary: [ ]  Home oxygen, [ ]  Productive cough, [ ]  Coughing up blood, [ ]  Asthma,  [ ]  Wheezing Musculoskeletal:  [ ]  Arthritis, [ ]  Low back pain, [ ]  Joint pain Hematologic: [ ]  Easy Bruising, [ ]  Anemia; [ ]  Hepatitis Gastrointestinal: [ ]  Blood in stool, [ ]  Gastroesophageal Reflux/heartburn, [ ]  Trouble swallowing Urinary: [ ]  chronic Kidney  disease, [ ]  on HD - [ ]  MWF or [ ]  TTHS, [ ]  Burning with urination, [ ]  Difficulty urinating Skin: [ ]  Rashes, [ ]  Wounds Psychological: [ ]  Anxiety, [ ]  Depression   Social History History  Substance Use Topics  . Smoking status: Former Smoker -- 1.0 packs/day for 24 years    Types: Cigarettes    Quit date: 07/29/1987  . Smokeless tobacco: Never Used  . Alcohol Use: No    Family History Family History  Problem Relation Age of Onset  . Stroke Father 46  . Stroke Mother 49    Allergies  Allergen Reactions  . Aspirin Other (See Comments)    Dr. Teena Dunk informed patient not to take this due to ulcer  . Ibuprofen Other (See Comments)    Dr. Teena Dunk informed patient not to take this due to ulcer     Current Outpatient Prescriptions  Medication Sig Dispense Refill  . acetaminophen (TYLENOL) 500 MG tablet Take 1,000 mg by mouth every 6 (six) hours as needed.      Marland Kitchen atorvastatin (LIPITOR) 80 MG tablet Take 80 mg by mouth daily.      Marland Kitchen glipiZIDE (GLUCOTROL) 5 MG tablet Take 5 mg by mouth 2 (two) times daily before a meal.      . IRON PO Take by mouth 2 (two) times  daily.      . labetalol (NORMODYNE) 300 MG tablet Take 300 mg by mouth 2 (two) times daily.      Marland Kitchen lisinopril-hydrochlorothiazide (PRINZIDE,ZESTORETIC) 10-12.5 MG per tablet Take 1 tablet by mouth daily.      . metFORMIN (GLUCOPHAGE) 500 MG tablet Take 500 mg by mouth 3 (three) times daily.      . nitroGLYCERIN (NITROSTAT) 0.4 MG SL tablet Place 1 tablet (0.4 mg total) under the tongue every 5 (five) minutes as needed for chest pain.  25 tablet  3  . traMADol (ULTRAM) 50 MG tablet Take 50 mg by mouth 2 (two) times daily.       Marland Kitchen aspirin EC 81 MG EC tablet Take 1 tablet (81 mg total) by mouth daily.      . clopidogrel (PLAVIX) 75 MG tablet Take 1 tablet (75 mg total) by mouth daily with breakfast.  30 tablet  0  . L-Methylfolate-B6-B12 (METANX PO) Take 1 tablet by mouth daily.       Marland Kitchen oxyCODONE-acetaminophen  (PERCOCET/ROXICET) 5-325 MG per tablet Take 1 tablet by mouth as needed.        Physical Examination  Filed Vitals:   06/23/12 1446  BP: 155/58  Pulse:     Body mass index is 25.24 kg/(m^2).  General:  WDWN in NAD Gait: Normal HEENT: WNL Eyes: Pupils equal Pulmonary: normal non-labored breathing , without Rales, rhonchi,  wheezing Cardiac: RRR, without  Murmurs, rubs or gallops; No carotid bruits Abdomen: soft, NT, no masses Skin: no rashes, ulcers noted Vascular Exam/Pulses: Femoral pulses are palpable, the second incision on the right side is red and there is no drainage, there is a hard mass about the length of the incision that the patient states his sore to the touch. Otherwise she is well healed  Extremities without ischemic changes, no Gangrene , no cellulitis; no open wounds;  Musculoskeletal: no muscle wasting or atrophy  Neurologic: A&O X 3; Appropriate Affect ; SENSATION: normal; MOTOR FUNCTION:  moving all extremities equally. Speech is fluent/normal  Non-Invasive Vascular Imaging: ABIs show noncompressible vessels with biphasic waveform on the right and a widely patent bypass graft  ASSESSMENT/PLAN: I spoke with Dr. Arbie Cookey who is on call, regarding the patient's situation he recommended we start the patient on Keflex. I will send that prescription to her pharmacy. She will followup with Dr. Darrick Penna next Thursday, December 5 for wound check. The patient and her daughter understand that if the thigh begins to look worse than it does now she should be seen sooner and they know to call our office or if it is over the weekend she should go to [facility] emergency room. Their questions were encouraged and answered, they're in agreement with this plan.  Lauree Chandler ANP  Clinic M.D.: Early on call

## 2012-06-28 NOTE — Addendum Note (Signed)
Addended by: Lorin Mercy K on: 06/28/2012 09:00 AM   Modules accepted: Orders

## 2012-06-30 ENCOUNTER — Encounter: Payer: Self-pay | Admitting: Vascular Surgery

## 2012-07-01 ENCOUNTER — Encounter: Payer: Self-pay | Admitting: Vascular Surgery

## 2012-07-01 ENCOUNTER — Ambulatory Visit (INDEPENDENT_AMBULATORY_CARE_PROVIDER_SITE_OTHER): Payer: Medicare Other | Admitting: Vascular Surgery

## 2012-07-01 VITALS — BP 190/70 | HR 86 | Ht 62.0 in | Wt 136.6 lb

## 2012-07-01 DIAGNOSIS — I739 Peripheral vascular disease, unspecified: Secondary | ICD-10-CM

## 2012-07-01 DIAGNOSIS — L98499 Non-pressure chronic ulcer of skin of other sites with unspecified severity: Secondary | ICD-10-CM

## 2012-07-01 MED ORDER — CEPHALEXIN 500 MG PO CAPS: 500.0000 mg | ORAL_CAPSULE | Freq: Four times a day (QID) | ORAL | Status: DC

## 2012-07-01 NOTE — Progress Notes (Signed)
Patient is an 78 year old female who previously underwent a right femoral-popliteal bypass with femoral endarterectomy with vein in September 2013. She was seen by her stricture proceed Webb Silversmith on November 27. At that time she had developed some erythema in her mid thigh. She was placed on antibiotics at that time. She returns today for further followup. Her toe amputation site has healed. She denies any claudication symptoms. She has had no drainage. She is currently on Keflex 500 mg 4 times a day.  Review of systems: She denies any fever or chills. She denies any shortness of breath. She denies any chest pain.  Physical exam: Filed Vitals:   07/01/12 1547  BP: 190/70  Pulse: 86  Height: 5\' 2"  (1.575 m)  Weight: 136 lb 9.6 oz (61.961 kg)  SpO2: 98%   Right lower extremity: Well-healed incisions including toe amputation site, erythema in the proximal medial thigh which is slightly tender to palpation. No fluctuance, there is some induration and pitting edema.  Assessment: Cellulitis right medial thigh. According to the patient her symptoms have improved somewhat as well as the appearance of the erythema. I believe the best option would be to continue her antibiotics for an additional 2 weeks. If the erythema persists we will consider getting an ultrasound see if there is a deep fluid collection.  Plan: See above  [name], MD [facility] [place] Office: [phone] Pager: [phone]

## 2012-07-14 ENCOUNTER — Encounter: Payer: Self-pay | Admitting: Vascular Surgery

## 2012-07-15 ENCOUNTER — Ambulatory Visit (INDEPENDENT_AMBULATORY_CARE_PROVIDER_SITE_OTHER): Payer: Medicare Other | Admitting: Vascular Surgery

## 2012-07-15 ENCOUNTER — Encounter: Payer: Self-pay | Admitting: Vascular Surgery

## 2012-07-15 VITALS — BP 160/60 | HR 77 | Temp 98.1°F | Ht 62.0 in | Wt 140.3 lb

## 2012-07-15 DIAGNOSIS — L98499 Non-pressure chronic ulcer of skin of other sites with unspecified severity: Secondary | ICD-10-CM | POA: Diagnosis not present

## 2012-07-15 DIAGNOSIS — I739 Peripheral vascular disease, unspecified: Secondary | ICD-10-CM

## 2012-07-15 NOTE — Progress Notes (Signed)
Patient is an 78 year old female who underwent right femoral endarterectomy right femoropopliteal bypass with vein in September of 2013. She developed a cellulitis in her saphenectomy incision in the medial right thigh. She was placed on Keflex. She returns today for further followup. Of note she also previously had a right first toe amputation which is now well-healed. The patient is almost out of Keflex and has a few days left. She states that the pain in the redness in her thigh has improved significantly.  Physical exam: Filed Vitals:   07/15/12 0851  BP: 160/60  Pulse: 77  Temp: 98.1 F (36.7 C)  TempSrc: Oral  Height: 5\' 2"  (1.575 m)  Weight: 140 lb 4.8 oz (63.64 kg)  SpO2: 97%   Right lower extremity: Foot is edematous it is pink and warm. Pulses not actually palpable today do to edema, toe amputation sight is well-healed, right medial thigh still with some induration 3 x 4 cm area, nonpulsatile nontender very mild erythema  Assessment: Improving cellulitis right leg with patent bypass right leg  Plan: The patient will finish her course of antibiotics. If she has recurrence we will consider drainage of this. Otherwise she continues to do well she will return in a few months for a graft duplex scan.  [name], MD [facility] [place] Office: [phone] Pager: [phone]

## 2012-08-31 DIAGNOSIS — M549 Dorsalgia, unspecified: Secondary | ICD-10-CM | POA: Diagnosis not present

## 2012-08-31 DIAGNOSIS — E782 Mixed hyperlipidemia: Secondary | ICD-10-CM | POA: Diagnosis not present

## 2012-08-31 DIAGNOSIS — IMO0001 Reserved for inherently not codable concepts without codable children: Secondary | ICD-10-CM | POA: Diagnosis not present

## 2012-09-06 DIAGNOSIS — M4120 Other idiopathic scoliosis, site unspecified: Secondary | ICD-10-CM | POA: Diagnosis not present

## 2012-09-06 DIAGNOSIS — M47817 Spondylosis without myelopathy or radiculopathy, lumbosacral region: Secondary | ICD-10-CM | POA: Diagnosis not present

## 2012-09-06 DIAGNOSIS — M47814 Spondylosis without myelopathy or radiculopathy, thoracic region: Secondary | ICD-10-CM | POA: Diagnosis not present

## 2012-09-06 DIAGNOSIS — M5137 Other intervertebral disc degeneration, lumbosacral region: Secondary | ICD-10-CM | POA: Diagnosis not present

## 2012-09-06 DIAGNOSIS — M899 Disorder of bone, unspecified: Secondary | ICD-10-CM | POA: Diagnosis not present

## 2012-09-20 DIAGNOSIS — Z23 Encounter for immunization: Secondary | ICD-10-CM | POA: Diagnosis not present

## 2012-09-22 ENCOUNTER — Encounter: Payer: Self-pay | Admitting: Vascular Surgery

## 2012-09-23 ENCOUNTER — Encounter: Payer: Self-pay | Admitting: Vascular Surgery

## 2012-09-23 ENCOUNTER — Other Ambulatory Visit: Payer: Self-pay | Admitting: *Deleted

## 2012-09-23 ENCOUNTER — Ambulatory Visit (INDEPENDENT_AMBULATORY_CARE_PROVIDER_SITE_OTHER): Payer: Medicare Other | Admitting: Vascular Surgery

## 2012-09-23 ENCOUNTER — Encounter (INDEPENDENT_AMBULATORY_CARE_PROVIDER_SITE_OTHER): Payer: Medicare Other | Admitting: *Deleted

## 2012-09-23 VITALS — BP 195/80 | HR 91 | Ht 62.0 in | Wt 143.0 lb

## 2012-09-23 DIAGNOSIS — I739 Peripheral vascular disease, unspecified: Secondary | ICD-10-CM

## 2012-09-23 DIAGNOSIS — Z48812 Encounter for surgical aftercare following surgery on the circulatory system: Secondary | ICD-10-CM

## 2012-09-23 DIAGNOSIS — L98499 Non-pressure chronic ulcer of skin of other sites with unspecified severity: Secondary | ICD-10-CM | POA: Diagnosis not present

## 2012-09-23 DIAGNOSIS — I70219 Atherosclerosis of native arteries of extremities with intermittent claudication, unspecified extremity: Secondary | ICD-10-CM

## 2012-09-24 NOTE — Progress Notes (Signed)
Patient is an 79 year old female who returns for followup today. She previously had a right femoral endarterectomy and right femoral to below-knee popliteal bypass 03/23/2012. This was a vein bypass. She also had amputation of her right first toe at that time. The mesh is also previously had a left superficial femoral artery stent in 2004.  She had a hematoma in her mid thigh which is now improved. She still has lower extremity edema bilaterally.  Review of systems: She denies shortness of breath. She denies chest pain.  Physical exam:  Filed Vitals:   09/23/12 1424  BP: 195/80  Pulse: 91  Height: 5\' 2"  (1.575 m)  Weight: 143 lb (64.864 kg)  SpO2: 96%   Chest: Clear to auscultation  Cardiac: Regular rate and rhythm  Extremities: Edematous bilaterally Extremities: Pedal pulses not palpable bilaterally secondary to edema  Data: Patient had bilateral ABIs today but she was noncompressible. She did have monophasic waveforms.  Duplex showed a patent bypass graft with no increased velocities still a small mid thigh hematoma I reviewed and interpreted this study  Assessment: Patent bypass toe amputation healed chronic leg swelling  Plan: Patient is off her bilateral lower extremity compression stockings to help with edema. She will continue to follow up with her primary care physician regarding this as well. She will be scheduled for a repeat duplex exam of her bypass graft in 3 months time.  [name], MD [facility] [place] Office: [phone] Pager: [phone]

## 2012-11-16 DIAGNOSIS — E1149 Type 2 diabetes mellitus with other diabetic neurological complication: Secondary | ICD-10-CM | POA: Diagnosis not present

## 2012-11-16 DIAGNOSIS — E119 Type 2 diabetes mellitus without complications: Secondary | ICD-10-CM | POA: Diagnosis not present

## 2012-11-29 DIAGNOSIS — I10 Essential (primary) hypertension: Secondary | ICD-10-CM | POA: Diagnosis not present

## 2012-12-22 ENCOUNTER — Encounter: Payer: Self-pay | Admitting: Vascular Surgery

## 2012-12-23 ENCOUNTER — Encounter (INDEPENDENT_AMBULATORY_CARE_PROVIDER_SITE_OTHER): Payer: Medicare Other | Admitting: Vascular Surgery

## 2012-12-23 ENCOUNTER — Ambulatory Visit (INDEPENDENT_AMBULATORY_CARE_PROVIDER_SITE_OTHER): Payer: Medicare Other | Admitting: Vascular Surgery

## 2012-12-23 ENCOUNTER — Encounter: Payer: Self-pay | Admitting: Vascular Surgery

## 2012-12-23 ENCOUNTER — Other Ambulatory Visit: Payer: Self-pay | Admitting: *Deleted

## 2012-12-23 VITALS — BP 149/73 | HR 80 | Resp 16 | Ht 62.5 in | Wt 137.0 lb

## 2012-12-23 DIAGNOSIS — I739 Peripheral vascular disease, unspecified: Secondary | ICD-10-CM | POA: Diagnosis not present

## 2012-12-23 DIAGNOSIS — Z48812 Encounter for surgical aftercare following surgery on the circulatory system: Secondary | ICD-10-CM | POA: Diagnosis not present

## 2012-12-23 NOTE — Progress Notes (Signed)
Patient is an 79 year old female who returns for followup today. She previously had a right femoral endarterectomy and right femoral to below-knee popliteal bypass 03/23/2012. This was a vein bypass. She also had amputation of her right first toe at that time. The patient also previously had a left superficial femoral artery stent in 2004.  She had a hematoma in her mid thigh which is now improved but persistant.  She still has lower extremity edema bilaterally, right greater than left. She reports no claudication symptoms. The fluid collection in her right medial thigh sometimes bothers her but not significantly enough that she wished to have this drained  Review of systems: She denies shortness of breath. She denies chest pain.  Current Outpatient Prescriptions on File Prior to Visit  Medication Sig Dispense Refill  . acetaminophen (TYLENOL) 500 MG tablet Take 1,000 mg by mouth every 6 (six) hours as needed.      Marland Kitchen atorvastatin (LIPITOR) 80 MG tablet Take 80 mg by mouth daily.      Marland Kitchen glipiZIDE (GLUCOTROL) 5 MG tablet Take 5 mg by mouth 2 (two) times daily before a meal.      . IRON PO Take by mouth 2 (two) times daily.      Marland Kitchen L-Methylfolate-B6-B12 (METANX PO) Take 1 tablet by mouth daily.       Marland Kitchen labetalol (NORMODYNE) 300 MG tablet Take 300 mg by mouth 2 (two) times daily.      Marland Kitchen lisinopril-hydrochlorothiazide (PRINZIDE,ZESTORETIC) 10-12.5 MG per tablet Take 1 tablet by mouth daily.      . metFORMIN (GLUCOPHAGE) 500 MG tablet Take 500 mg by mouth 3 (three) times daily.      . nitroGLYCERIN (NITROSTAT) 0.4 MG SL tablet Place 1 tablet (0.4 mg total) under the tongue every 5 (five) minutes as needed for chest pain.  25 tablet  3  . traMADol (ULTRAM) 50 MG tablet Take 50 mg by mouth 2 (two) times daily.       Marland Kitchen aspirin EC 81 MG EC tablet Take 1 tablet (81 mg total) by mouth daily.      . cephALEXin (KEFLEX) 500 MG capsule Take 1 capsule (500 mg total) by mouth 4 (four) times daily.  56 capsule  0   . cephALEXin (KEFLEX) 500 MG capsule Take 1 capsule (500 mg total) by mouth 4 (four) times daily.  40 capsule  0  . clopidogrel (PLAVIX) 75 MG tablet Take 1 tablet (75 mg total) by mouth daily with breakfast.  30 tablet  0  . oxyCODONE-acetaminophen (PERCOCET/ROXICET) 5-325 MG per tablet Take 1 tablet by mouth as needed.       No current facility-administered medications on file prior to visit.     Physical exam:    Filed Vitals:   12/23/12 1056  BP: 149/73  Pulse: 80  Resp: 16  Height: 5' 2.5" (1.588 m)  Weight: 137 lb (62.143 kg)  SpO2: 98%   Chest: Clear to auscultation  Cardiac: Regular rate and rhythm  Extremities: Edematous bilaterally but more pronounced on the right side, 3 x 4 cm mass right medial thigh no erythema no tenderness Extremities: Pedal pulses not palpable bilaterally secondary to edema, toe amputation well-healed  Data: The patient had bilateral arterial duplex today which showed a patent left superficial femoral stent. It appeared to have left popliteal occlusion with one-vessel runoff via the anterior tibial. The right lower extremity bypass is patent with a 3 cm fluid collection adjacent to the graft and thigh  Assessment:  Patent bypass, toe amputation healed, chronic leg swelling.  Right thigh mass either a seroma or small hematoma with minimal symptoms continue to observe  Plan: She will be scheduled for a repeat duplex exam of her bypass graft in 3 months time.  [name], MD [facility] Office: [phone] Pager: [phone]  VASCULAR QUALITY INITIATIVE FOLLOW UP DATA:  Current smoker: [  ] yes  [  x] no  Living status: [x]   Home  [  ] Nursing home  [  ] Homeless    MEDS:  ASA [ x ] yes  [  ] no- [  ] medical reason  [  ] non compliant  STATIN  [ x ] yes  [  ] no- [  ] medical reason  [  ] non compliant  Beta blocker [x  ] yes  [  ] no- [  ] medical reason  [  ] non compliant  ACE inhibitor [ x ]  yes  [  ] no- [  ] medical reason  [  ] non compliant  P2Y12 Antagonist [  ] none  [ x ] clopidogrel-Plavix  [  ] ticlopidine-Ticlid   [  ] prasugrel-Effient  [  ] ticagrelor- Brilinta    Anticoagulant [ x ] None  [  ] warfarin  [  ] rivaroxaban-Xarelto [  ] dabigatran- Pradaxa  Ambulation: [ x ] Amb  [  ] Amb with assistance  [  ] wheelchair  [  ] bedridden  Ipsilateral Sx: [ x ] none   [  ] claudication  [  ] rest pain  [  ] tissue loss  Current Patency: [x  ] primary  [  ] primary-assisted  [  ] secondary  [  ] occluded  Patency judged by: [x  ] doppler  [  ] palp graft pulse  [  ] palp distal pulse   [  ] ABI increase > 15%   [x ] Duplex  If occluded, when-   Ipsilateral ABI: calcified  Ipsilateral TBI: 0.54 with toe pressure >100  Infection: [x  ] none  [  ] cellulitis  [  ] deep abscess  [  ] infection of artery or graft  Bypass revision: [x  ] no  [  ] yes- [  ] surg  [  ] catheter based  [  ] both    Date:   Thrombectomy/ lysis/ revision:  [ x ] no  [  ] yes- [  ] surg  [  ] catheter based  [  ] both    Date:   Major amputation: [  ] no  [ x ] minor amp  [  ] BKA  [  ] AKA   Date: toe at time of bypass

## 2013-02-08 DIAGNOSIS — E119 Type 2 diabetes mellitus without complications: Secondary | ICD-10-CM | POA: Diagnosis not present

## 2013-02-08 DIAGNOSIS — E1149 Type 2 diabetes mellitus with other diabetic neurological complication: Secondary | ICD-10-CM | POA: Diagnosis not present

## 2013-03-01 DIAGNOSIS — IMO0001 Reserved for inherently not codable concepts without codable children: Secondary | ICD-10-CM | POA: Diagnosis not present

## 2013-03-01 DIAGNOSIS — I10 Essential (primary) hypertension: Secondary | ICD-10-CM | POA: Diagnosis not present

## 2013-03-01 DIAGNOSIS — E782 Mixed hyperlipidemia: Secondary | ICD-10-CM | POA: Diagnosis not present

## 2013-03-10 DIAGNOSIS — H524 Presbyopia: Secondary | ICD-10-CM | POA: Diagnosis not present

## 2013-03-10 DIAGNOSIS — H35359 Cystoid macular degeneration, unspecified eye: Secondary | ICD-10-CM | POA: Diagnosis not present

## 2013-03-10 DIAGNOSIS — E119 Type 2 diabetes mellitus without complications: Secondary | ICD-10-CM | POA: Diagnosis not present

## 2013-03-15 DIAGNOSIS — E1139 Type 2 diabetes mellitus with other diabetic ophthalmic complication: Secondary | ICD-10-CM | POA: Diagnosis not present

## 2013-03-15 DIAGNOSIS — H353190 Nonexudative age-related macular degeneration, unspecified eye, stage unspecified: Secondary | ICD-10-CM | POA: Diagnosis not present

## 2013-03-15 DIAGNOSIS — H31009 Unspecified chorioretinal scars, unspecified eye: Secondary | ICD-10-CM | POA: Diagnosis not present

## 2013-03-15 DIAGNOSIS — H35359 Cystoid macular degeneration, unspecified eye: Secondary | ICD-10-CM | POA: Diagnosis not present

## 2013-03-17 DIAGNOSIS — H35359 Cystoid macular degeneration, unspecified eye: Secondary | ICD-10-CM | POA: Diagnosis not present

## 2013-03-17 DIAGNOSIS — E11311 Type 2 diabetes mellitus with unspecified diabetic retinopathy with macular edema: Secondary | ICD-10-CM | POA: Diagnosis not present

## 2013-03-23 ENCOUNTER — Encounter: Payer: Self-pay | Admitting: Vascular Surgery

## 2013-03-24 ENCOUNTER — Encounter (INDEPENDENT_AMBULATORY_CARE_PROVIDER_SITE_OTHER): Payer: Medicare Other | Admitting: *Deleted

## 2013-03-24 ENCOUNTER — Ambulatory Visit (INDEPENDENT_AMBULATORY_CARE_PROVIDER_SITE_OTHER): Payer: Medicare Other | Admitting: Vascular Surgery

## 2013-03-24 ENCOUNTER — Encounter: Payer: Self-pay | Admitting: Vascular Surgery

## 2013-03-24 VITALS — BP 180/78 | HR 80 | Resp 18 | Ht 62.0 in | Wt 138.0 lb

## 2013-03-24 DIAGNOSIS — I739 Peripheral vascular disease, unspecified: Secondary | ICD-10-CM | POA: Diagnosis not present

## 2013-03-24 DIAGNOSIS — M7989 Other specified soft tissue disorders: Secondary | ICD-10-CM

## 2013-03-24 DIAGNOSIS — Z48812 Encounter for surgical aftercare following surgery on the circulatory system: Secondary | ICD-10-CM

## 2013-03-24 NOTE — Progress Notes (Signed)
The patient is an 79 year old female who previously underwent right femoral popliteal bypass graft in August of 2013. She also had amputation of the tip of her right first toe at that time. She is also previously had a left superficial femoral artery stent but has known chronic occlusion of the distal superficial femoral artery and popliteal artery by arteriogram. She returns today for routine followup. She denies any claudication symptoms. She has no open wounds. She denies rest pain. She does not walk much but is able to complete household chores.  Review of systems: She denies shortness of breath. She denies chest pain.  Physical exam:  Filed Vitals:   03/24/13 1618  BP: 180/78  Pulse: 80  Resp: 18  Height: 5\' 2"  (1.575 m)  Weight: 138 lb (62.596 kg)  SpO2: 97%   Extremities: 2+ femoral pulses bilaterally, absent pedal pulses bilaterally (of note 1 vessel peroneal runoff to the right foot)  Skin: No open ulcers or wounds  Chest: Clear to auscultation bilaterally  Neck: No carotid bruits  Data: Duplex ultrasound of the lower extremities as well as ABIs was performed today. Duplex ultrasound shows a widely patent right femoral popliteal bypass and chronic occlusion of the left distal superficial femoral popliteal artery.  I reviewed and interpreted this study.  Assessment: Bilateral peripheral arterial disease a patent right femoropopliteal bypass graft. Chronic occlusion left superficial and popliteal artery asymptomatic  Plan: Followup duplex scan 6 months with repeat ABIs  [name], MD [facility] [place] Office: [phone] Pager: [phone]

## 2013-03-25 NOTE — Addendum Note (Signed)
Addended by: Adria Dill L on: 03/25/2013 10:27 AM   Modules accepted: Orders

## 2013-04-04 DIAGNOSIS — E11349 Type 2 diabetes mellitus with severe nonproliferative diabetic retinopathy without macular edema: Secondary | ICD-10-CM | POA: Diagnosis not present

## 2013-04-04 DIAGNOSIS — E11311 Type 2 diabetes mellitus with unspecified diabetic retinopathy with macular edema: Secondary | ICD-10-CM | POA: Diagnosis not present

## 2013-04-04 DIAGNOSIS — E1139 Type 2 diabetes mellitus with other diabetic ophthalmic complication: Secondary | ICD-10-CM | POA: Diagnosis not present

## 2013-04-18 DIAGNOSIS — E11349 Type 2 diabetes mellitus with severe nonproliferative diabetic retinopathy without macular edema: Secondary | ICD-10-CM | POA: Diagnosis not present

## 2013-04-18 DIAGNOSIS — E1139 Type 2 diabetes mellitus with other diabetic ophthalmic complication: Secondary | ICD-10-CM | POA: Diagnosis not present

## 2013-04-19 DIAGNOSIS — E1149 Type 2 diabetes mellitus with other diabetic neurological complication: Secondary | ICD-10-CM | POA: Diagnosis not present

## 2013-04-19 DIAGNOSIS — E119 Type 2 diabetes mellitus without complications: Secondary | ICD-10-CM | POA: Diagnosis not present

## 2013-05-06 DIAGNOSIS — Z23 Encounter for immunization: Secondary | ICD-10-CM | POA: Diagnosis not present

## 2013-06-06 DIAGNOSIS — I10 Essential (primary) hypertension: Secondary | ICD-10-CM | POA: Diagnosis not present

## 2013-06-08 ENCOUNTER — Other Ambulatory Visit: Payer: Self-pay | Admitting: Vascular Surgery

## 2013-06-08 DIAGNOSIS — Z48812 Encounter for surgical aftercare following surgery on the circulatory system: Secondary | ICD-10-CM

## 2013-06-08 DIAGNOSIS — I739 Peripheral vascular disease, unspecified: Secondary | ICD-10-CM

## 2013-06-15 ENCOUNTER — Encounter: Payer: Self-pay | Admitting: Cardiology

## 2013-06-15 ENCOUNTER — Ambulatory Visit (INDEPENDENT_AMBULATORY_CARE_PROVIDER_SITE_OTHER): Payer: Medicare Other | Admitting: Cardiology

## 2013-06-15 VITALS — BP 178/79 | HR 84 | Ht 62.0 in | Wt 144.8 lb

## 2013-06-15 DIAGNOSIS — I2510 Atherosclerotic heart disease of native coronary artery without angina pectoris: Secondary | ICD-10-CM

## 2013-06-15 DIAGNOSIS — I10 Essential (primary) hypertension: Secondary | ICD-10-CM | POA: Diagnosis not present

## 2013-06-15 DIAGNOSIS — R011 Cardiac murmur, unspecified: Secondary | ICD-10-CM

## 2013-06-15 DIAGNOSIS — E785 Hyperlipidemia, unspecified: Secondary | ICD-10-CM

## 2013-06-15 MED ORDER — CHLORTHALIDONE 25 MG PO TABS: 25.0000 mg | ORAL_TABLET | Freq: Every day | ORAL | Status: DC

## 2013-06-15 MED ORDER — LISINOPRIL 10 MG PO TABS: 10.0000 mg | ORAL_TABLET | Freq: Every day | ORAL | Status: DC

## 2013-06-15 NOTE — Patient Instructions (Signed)
Your physician recommends that you schedule a follow-up appointment in: 2 months with Dr. Wyline Mood. This appointment will be scheduled today.  Your physician has recommended you make the following change in your medication:  Stop: Lisinopril - HCTZ Start Lisinopril 10 MG once daily Start: Chlorthiladone 25 MG once daily  Continue all other medications the same.   Your physician has requested that you have an echocardiogram. Echocardiography is a painless test that uses sound waves to create images of your heart. It provides your doctor with information about the size and shape of your heart and how well your heart's chambers and valves are working. This procedure takes approximately one hour. There are no restrictions for this procedure.  Please take your blood pressure daily and record it bring with you to your next office visit.

## 2013-06-15 NOTE — Progress Notes (Signed)
Clinical Summary Cynthia Ray is a 79 y.o.female last seen by Dr Antoine Poche, this is our first visit together. She was seen for the following medical problems.  1. CAD - referred for MPI 12/2011 as part of preoperative vascular surgery evaluation, found to have inferior wall ischemia with normal LVEF - referred for cath, received a BMS to her RCA  - denies any chest pain recently. Denies any SOB or DOE, fairly sedentary lifestyle because of chronic leg pain.  - compliant with meds: atorva, labetalol, lisinopril. Has not been on ASA because of anemia and history ofGI bleed.    2. HTN - checks regularly, typically 3 times a week. Usually 170s/70s.  - compliant with meds  3. Hyperlipidemia - compliant with atorvastatin - followed by PCP, do not have recent panel on our system.   4. PAD - followed by vascular, prior stenting, prior fem-pop bypass Aug 2013     Past Medical History  Diagnosis Date  . Hyperlipidemia   . Hypertension   . Carotid artery occlusion   . PVD (peripheral vascular disease)     Stents left SFA 2004 Dr. Samule Ohm  . Anemia   . GERD (gastroesophageal reflux disease)   . Type II diabetes mellitus   . History of blood transfusion 11/11/11  . Arthritis     "dr said I have it in my back" Lower      Allergies  Allergen Reactions  . Aspirin Other (See Comments)    Dr. Teena Dunk informed patient not to take this due to ulcer  . Ibuprofen Other (See Comments)    Dr. Teena Dunk informed patient not to take this due to ulcer      Current Outpatient Prescriptions  Medication Sig Dispense Refill  . acetaminophen (TYLENOL) 500 MG tablet Take 1,000 mg by mouth every 6 (six) hours as needed.      Marland Kitchen atorvastatin (LIPITOR) 80 MG tablet Take 80 mg by mouth daily.      . cephALEXin (KEFLEX) 500 MG capsule Take 1 capsule (500 mg total) by mouth 4 (four) times daily.  56 capsule  0  . cephALEXin (KEFLEX) 500 MG capsule Take 1 capsule (500 mg total) by mouth 4 (four) times  daily.  40 capsule  0  . glipiZIDE (GLUCOTROL) 5 MG tablet Take 5 mg by mouth 2 (two) times daily before a meal.      . IRON PO Take by mouth 2 (two) times daily.      Marland Kitchen L-Methylfolate-B6-B12 (METANX PO) Take 1 tablet by mouth daily.       Marland Kitchen labetalol (NORMODYNE) 300 MG tablet Take 300 mg by mouth 2 (two) times daily.      Marland Kitchen lisinopril-hydrochlorothiazide (PRINZIDE,ZESTORETIC) 10-12.5 MG per tablet Take 1 tablet by mouth daily.      . metFORMIN (GLUCOPHAGE) 500 MG tablet Take 500 mg by mouth 3 (three) times daily.      . nitroGLYCERIN (NITROSTAT) 0.4 MG SL tablet Place 1 tablet (0.4 mg total) under the tongue every 5 (five) minutes as needed for chest pain.  25 tablet  3  . oxyCODONE-acetaminophen (PERCOCET/ROXICET) 5-325 MG per tablet Take 1 tablet by mouth as needed.      . traMADol (ULTRAM) 50 MG tablet Take 50 mg by mouth 2 (two) times daily.        No current facility-administered medications for this visit.     Past Surgical History  Procedure Laterality Date  . Eye surgery  2002  Cataract  . Eye surgery  2009    Laser bilateral   . Coronary angioplasty with stent placement  01/19/12    "1; first one I've ever had"  . Tonsillectomy and adenoidectomy  1946  . Posterior laminectomy / decompression lumbar spine  1989  . Peripheral arterial stent graft  2003    LLE  . Cataract extraction w/ intraocular lens  implant, bilateral  2002  . Refractive surgery  2009    bilaterally  . Colonoscopy    . Femoral-popliteal bypass graft  03/23/2012    Procedure: BYPASS GRAFT FEMORAL-POPLITEAL ARTERY;  Surgeon: Sherren Kerns, MD;  Location: [facility] OR;  Service: Vascular;  Laterality: Right;  Right Femoral endarterectomy with profundaplasty; right femoral-popliteal bypass with nonreversed saphenous vein; and right first toe amputation  . Amputation  03/23/2012    Procedure: AMPUTATION DIGIT;  Surgeon: Sherren Kerns, MD;  Location: [facility] OR;  Service: Vascular;  Laterality: Right;  Right Femoral  endarterectomy with profundaplasty; right femoral-popliteal bypass with nonreversed saphenous vein; and right first toe amputation  . Femoral endarterectomy  03/23/2012    right     Allergies  Allergen Reactions  . Aspirin Other (See Comments)    Dr. Teena Dunk informed patient not to take this due to ulcer  . Ibuprofen Other (See Comments)    Dr. Teena Dunk informed patient not to take this due to ulcer       Family History  Problem Relation Age of Onset  . Stroke Father 65  . Stroke Mother 46     Social History Cynthia Ray reports that she quit smoking about 25 years ago. Her smoking use included Cigarettes. She has a 24 pack-year smoking history. She has never used smokeless tobacco. Cynthia Ray reports that she does not drink alcohol.   Review of Systems CONSTITUTIONAL: No weight loss, fever, chills, weakness or fatigue.  HEENT: Eyes: No visual loss, blurred vision, double vision or yellow sclerae.No hearing loss, sneezing, congestion, runny nose or sore throat.  SKIN: No rash or itching.  CARDIOVASCULAR: per HPI RESPIRATORY: per HPI GASTROINTESTINAL: No anorexia, nausea, vomiting or diarrhea. No abdominal pain or blood.  GENITOURINARY: No burning on urination, no polyuria NEUROLOGICAL: No headache, dizziness, syncope, paralysis, ataxia, numbness or tingling in the extremities. No change in bowel or bladder control.  MUSCULOSKELETAL: chronic leg pain LYMPHATICS: No enlarged nodes. No history of splenectomy.  PSYCHIATRIC: No history of depression or anxiety.  ENDOCRINOLOGIC: No reports of sweating, cold or heat intolerance. No polyuria or polydipsia.  Marland Kitchen   Physical Examination p 84 bp 170/70 Wt 144 lbs BMI 26 Gen: resting comfortably, no acute distress HEENT: no scleral icterus, pupils equal round and reactive, no palptable cervical adenopathy,  CV: RRR, 2/6 systolic murmu RUSB early to mid peaking, bilateral carotid bruits Resp: Clear to auscultation bilaterally GI: abdomen is  soft, non-tender, non-distended, normal bowel sounds, no hepatosplenomegaly MSK: extremities are warm, 1+ bilateral edema  Skin: warm, no rash Neuro:  no focal deficits Psych: appropriate affect   Diagnostic Studies 12/2011 Stress MPI: inferior wall ischemia, normal LVEF  12/2011 [facility] Procedural Findings:  Hemodynamics:  AO 179/62  LV 192/12  Coronary angiography:  Coronary dominance: Right  Left mainstem: Moderately calcified. Distal 60%.  Left anterior descending (LAD): Probable ostial 60% (difficult to fully lay out the ostial circ/LAD/RI).  [place] Intermedius: Moderate sized. It appears to be free of high grade disease although it is difficult to visualize the ostium.  Left circumflex (LCx): AV groove  ostial 40%. OM1 moderate with ostial 50%. OM2 moderate and branching without high grade disease  Right coronary artery (RCA): Large. Heavy calcification. Long proximal 30%. Focal mid 99% followed by 95%. PDA small to moderate and normal. PL x 2 small to moderate and normal.  Left ventriculography: Left ventricular systolic function is normal, LVEF is estimated at 65%, there is no significant mitral regurgitation  Final Conclusions: Heavy valve calcification. Severe single vessel CAD with moderate disease elsewhere  Recommendations: I will review with colleagues for consideration of PCI of the RCA before possible lower extremity surgery.   Lesion Data:  Vessel: Mid RCA  Percent stenosis (pre): 90%  TIMI-flow (pre): 3  Stent: 2.5 x 23 mm MultiLink mini vision stent  Percent stenosis (post): 0%  TIMI-flow (post): 3  Conclusions:  1. Successful intracoronary stenting of the mid right coronary with a bare-metal stent. Unable to cross the lesion at the crux.   06/15/13 Clinic EKG: sinus rhythm, normal axis, no ischemic changes   Assessment and Plan  1. CAD - no current symptoms - continue current medications and risk factor modfication  2. Hyperlipidemia - will request recent  lipid panel from her PCP, continue current statin  3. HTN - above goal, given her history of DM her goal blood pressure is < 130/80 - will stop her lisinopril/HCTZ combo. Restart lisinopril 10mg  daily, and start chlorthalidone 25mg  daily.  - asked patient to keep blood pressure log and notify us of the valutes.   4. PAD - she will continue to follow with vascular, she has ultrasounds planned for the lower extremities and the carotids  5. Heart murmur - exam consistent with AS murmur, will obtain echo to further eval. Currently no symptoms.       Antoine Poche, M.D., F.A.C.C.

## 2013-06-30 ENCOUNTER — Other Ambulatory Visit: Payer: Medicare Other

## 2013-06-30 ENCOUNTER — Ambulatory Visit: Payer: Medicare Other | Admitting: Neurosurgery

## 2013-07-05 DIAGNOSIS — E119 Type 2 diabetes mellitus without complications: Secondary | ICD-10-CM | POA: Diagnosis not present

## 2013-07-05 DIAGNOSIS — E1149 Type 2 diabetes mellitus with other diabetic neurological complication: Secondary | ICD-10-CM | POA: Diagnosis not present

## 2013-07-06 ENCOUNTER — Other Ambulatory Visit: Payer: Self-pay

## 2013-07-06 ENCOUNTER — Other Ambulatory Visit (INDEPENDENT_AMBULATORY_CARE_PROVIDER_SITE_OTHER): Payer: Medicare Other

## 2013-07-06 ENCOUNTER — Encounter: Payer: Self-pay | Admitting: Vascular Surgery

## 2013-07-06 DIAGNOSIS — R011 Cardiac murmur, unspecified: Secondary | ICD-10-CM | POA: Diagnosis not present

## 2013-07-06 DIAGNOSIS — I359 Nonrheumatic aortic valve disorder, unspecified: Secondary | ICD-10-CM | POA: Diagnosis not present

## 2013-07-06 DIAGNOSIS — I059 Rheumatic mitral valve disease, unspecified: Secondary | ICD-10-CM | POA: Diagnosis not present

## 2013-07-07 ENCOUNTER — Encounter (HOSPITAL_COMMUNITY): Payer: Medicare Other

## 2013-07-07 ENCOUNTER — Ambulatory Visit (INDEPENDENT_AMBULATORY_CARE_PROVIDER_SITE_OTHER): Payer: Medicare Other | Admitting: Vascular Surgery

## 2013-07-07 ENCOUNTER — Ambulatory Visit (HOSPITAL_COMMUNITY)
Admission: RE | Admit: 2013-07-07 | Discharge: 2013-07-07 | Disposition: A | Discharge status: Home / Self Care | Payer: Medicare Other | Source: Ambulatory Visit | Attending: Vascular Surgery | Admitting: Vascular Surgery

## 2013-07-07 ENCOUNTER — Other Ambulatory Visit (HOSPITAL_COMMUNITY): Payer: Medicare Other

## 2013-07-07 ENCOUNTER — Encounter: Payer: Self-pay | Admitting: Vascular Surgery

## 2013-07-07 ENCOUNTER — Ambulatory Visit (INDEPENDENT_AMBULATORY_CARE_PROVIDER_SITE_OTHER)
Admission: RE | Admit: 2013-07-07 | Discharge: 2013-07-07 | Disposition: A | Discharge status: Home / Self Care | Payer: Medicare Other | Source: Ambulatory Visit | Attending: Vascular Surgery | Admitting: Vascular Surgery

## 2013-07-07 VITALS — BP 227/92 | HR 92 | Ht 62.0 in | Wt 144.0 lb

## 2013-07-07 DIAGNOSIS — I739 Peripheral vascular disease, unspecified: Secondary | ICD-10-CM

## 2013-07-07 DIAGNOSIS — Z48812 Encounter for surgical aftercare following surgery on the circulatory system: Secondary | ICD-10-CM

## 2013-07-07 DIAGNOSIS — I6529 Occlusion and stenosis of unspecified carotid artery: Secondary | ICD-10-CM | POA: Diagnosis not present

## 2013-07-07 NOTE — Progress Notes (Signed)
The patient is an 79 year old female who previously underwent right femoral popliteal bypass graft in August of 2013. She also had amputation of the tip of her right first toe at that time. She is also previously had a left superficial femoral artery stent but has known chronic occlusion of the distal superficial femoral artery and popliteal artery by arteriogram. She returns today for routine followup. She denies any claudication symptoms. She has no open wounds. She denies rest pain. She has some chronic right foot swelling. She also has a chronic seroma in her right mid thigh. She does not walk much but is able to complete household chores.  Review of systems: She denies shortness of breath. She denies chest pain.  Current Outpatient Prescriptions on File Prior to Visit  Medication Sig Dispense Refill  . acetaminophen (TYLENOL) 500 MG tablet Take 1,000 mg by mouth every 6 (six) hours as needed.      Marland Kitchen atorvastatin (LIPITOR) 80 MG tablet Take 80 mg by mouth daily.      . chlorthalidone (HYGROTON) 25 MG tablet Take 1 tablet (25 mg total) by mouth daily.  30 tablet  6  . glipiZIDE (GLUCOTROL) 5 MG tablet Take 5 mg by mouth 2 (two) times daily before a meal.      . IRON PO Take by mouth 2 (two) times daily.      Marland Kitchen labetalol (NORMODYNE) 300 MG tablet Take 300 mg by mouth 2 (two) times daily.      Marland Kitchen lisinopril (PRINIVIL,ZESTRIL) 10 MG tablet Take 1 tablet (10 mg total) by mouth daily.  30 tablet  6  . metFORMIN (GLUCOPHAGE) 500 MG tablet Take 500 mg by mouth 3 (three) times daily.      . nitroGLYCERIN (NITROSTAT) 0.4 MG SL tablet Place 0.4 mg under the tongue every 5 (five) minutes as needed for chest pain.      . traMADol (ULTRAM) 50 MG tablet Take 50 mg by mouth 2 (two) times daily.        No current facility-administered medications on file prior to visit.     Physical exam:    Filed Vitals:   07/07/13 1328 07/07/13 1330  BP: 217/83 227/92  Pulse: 92   Height: 5\' 2"  (1.575 m)   Weight:  144 lb (65.318 kg)   SpO2: 97%     Extremities: 2+ femoral pulses bilaterally, absent pedal pulses bilaterally (of note 1 vessel peroneal runoff to the right foot), well-healed right first toe amputation site  Skin: No open ulcers or wounds  Chest: Clear to auscultation bilaterally  Neck: No carotid bruits  Data: Duplex ultrasound of the lower extremities as well as ABIs was performed today. I reviewed and interpreted this study. Duplex ultrasound shows a widely patent right femoral popliteal bypass and chronic occlusion of the left distal superficial femoral popliteal artery.  40-60% right internal carotid artery stenosis less than 40% left internal carotid artery stenosis unchanged since 2012  Assessment: Bilateral peripheral arterial disease a patent right femoropopliteal bypass graft. Chronic occlusion left superficial and popliteal artery asymptomatic  Plan: Followup duplex scan 12 months with repeat ABIs repeat carotid duplex at that time  [name], MD [facility] [place] [facility] Office: [phone] Pager: [phone]  VASCULAR QUALITY INITIATIVE FOLLOW UP DATA:  Current smoker: [  ] yes  [x  ] no  Living status: [ x ]  Home  [  ] Nursing home  [  ] Homeless    MEDS:  ASA [  x] yes  [  ] no- [  ] medical reason  [  ] non compliant  STATIN  [ x ] yes  [  ] no- [  ] medical reason  [  ] non compliant  Beta blocker [ x ] yes  [  ] no- [  ] medical reason  [  ] non compliant  ACE inhibitor [ x ] yes  [  ] no- [  ] medical reason  [  ] non compliant  P2Y12 Antagonist [  ] none  [  ] clopidogrel-Plavix  [  ] ticlopidine-Ticlid   [  ] prasugrel-Effient  [  ] ticagrelor- Brilinta    Anticoagulant [ x ] None  [  ] warfarin  [  ] rivaroxaban-Xarelto [  ] dabigatran- Pradaxa  Ambulation: [ x ] Amb  [  ] Amb with assistance  [  ] wheelchair  [  ] bedridden  Ipsilateral Sx: [ x ] none   [  ] claudication  [  ] rest pain  [  ] tissue loss  Current  Patency: [ x ] primary  [  ] primary-assisted  [  ] secondary  [  ] occluded  Patency judged by: [  ] doppler  [  ] palp graft pulse  [  ] palp distal pulse   [  ] ABI increase > 15%   [ x ] Duplex  If occluded, when-   Ipsilateral ABI: non compressible   Ipsilateral TBI: non compressible  Infection: [ x ] none  [  ] cellulitis  [  ] deep abscess  [  ] infection of artery or graft  Bypass revision: [x  ] no  [  ] yes- [  ] surg  [  ] catheter based  [  ] both    Date:   Thrombectomy/ lysis/ revision:  [ x ] no  [  ] yes- [  ] surg  [  ] catheter based  [  ] both    Date:   Major amputation: [  ] no  [  x] minor amp at time of initial bypass [  ] BKA  [  ] AKA   Date:

## 2013-07-11 ENCOUNTER — Other Ambulatory Visit: Payer: Self-pay | Admitting: *Deleted

## 2013-07-11 DIAGNOSIS — I739 Peripheral vascular disease, unspecified: Secondary | ICD-10-CM

## 2013-07-11 DIAGNOSIS — Z48812 Encounter for surgical aftercare following surgery on the circulatory system: Secondary | ICD-10-CM

## 2013-07-12 ENCOUNTER — Encounter: Payer: Self-pay | Admitting: *Deleted

## 2013-08-08 DIAGNOSIS — E11339 Type 2 diabetes mellitus with moderate nonproliferative diabetic retinopathy without macular edema: Secondary | ICD-10-CM | POA: Diagnosis not present

## 2013-08-08 DIAGNOSIS — H353190 Nonexudative age-related macular degeneration, unspecified eye, stage unspecified: Secondary | ICD-10-CM | POA: Diagnosis not present

## 2013-08-08 DIAGNOSIS — H43819 Vitreous degeneration, unspecified eye: Secondary | ICD-10-CM | POA: Diagnosis not present

## 2013-08-08 DIAGNOSIS — E1139 Type 2 diabetes mellitus with other diabetic ophthalmic complication: Secondary | ICD-10-CM | POA: Diagnosis not present

## 2013-08-17 ENCOUNTER — Encounter: Payer: Self-pay | Admitting: Cardiology

## 2013-08-17 ENCOUNTER — Ambulatory Visit (INDEPENDENT_AMBULATORY_CARE_PROVIDER_SITE_OTHER): Payer: Medicare Other | Admitting: Cardiology

## 2013-08-17 VITALS — BP 213/76 | HR 87 | Ht 62.0 in | Wt 145.0 lb

## 2013-08-17 DIAGNOSIS — I2510 Atherosclerotic heart disease of native coronary artery without angina pectoris: Secondary | ICD-10-CM | POA: Diagnosis not present

## 2013-08-17 DIAGNOSIS — Z79899 Other long term (current) drug therapy: Secondary | ICD-10-CM | POA: Diagnosis not present

## 2013-08-17 DIAGNOSIS — R011 Cardiac murmur, unspecified: Secondary | ICD-10-CM

## 2013-08-17 DIAGNOSIS — I10 Essential (primary) hypertension: Secondary | ICD-10-CM | POA: Diagnosis not present

## 2013-08-17 MED ORDER — CHLORTHALIDONE 50 MG PO TABS: 50.0000 mg | ORAL_TABLET | Freq: Every day | ORAL | Status: DC

## 2013-08-17 NOTE — Progress Notes (Signed)
Clinical Summary Cynthia Ray is a 80 y.o.female seen today for follow up of the following medical problems.   1. CAD  - referred for MPI 12/2011 as part of preoperative vascular surgery evaluation, found to have inferior wall ischemia with normal LVEF  - referred for cath, received a BMS to her RCA  - denies any chest pain recently. Denies any SOB or DOE, fairly sedentary lifestyle because of chronic leg pain.  - compliant with meds - she is not on ASA because of prior anemia and GI bleed  2. HTN  - checks regularly, usually ranges 150-200s/60s-90s based on her bp log - compliant with meds   3. Hyperlipidemia  - compliant with atorvastatin  - followed by PCP, do not have recent panel on our system.   4. PAD  - followed by vascular, prior stenting, prior fem-pop bypass Aug 2013    Past Medical History  Diagnosis Date  . Hyperlipidemia   . Hypertension   . Carotid artery occlusion   . PVD (peripheral vascular disease)     Stents left SFA 2004 Dr. Albertine Patricia  . Anemia   . GERD (gastroesophageal reflux disease)   . Type II diabetes mellitus   . History of blood transfusion 11/11/11  . Arthritis     "dr said I have it in my back" Lower      Allergies  Allergen Reactions  . Aspirin Other (See Comments)    Dr. Britta Mccreedy informed patient not to take this due to ulcer  . Ibuprofen Other (See Comments)    Dr. Britta Mccreedy informed patient not to take this due to ulcer      Current Outpatient Prescriptions  Medication Sig Dispense Refill  . acetaminophen (TYLENOL) 500 MG tablet Take 1,000 mg by mouth every 6 (six) hours as needed.      Marland Kitchen atorvastatin (LIPITOR) 80 MG tablet Take 80 mg by mouth daily.      . chlorthalidone (HYGROTON) 25 MG tablet Take 1 tablet (25 mg total) by mouth daily.  30 tablet  6  . glipiZIDE (GLUCOTROL) 5 MG tablet Take 5 mg by mouth 2 (two) times daily before a meal.      . IRON PO Take by mouth 2 (two) times daily.      Marland Kitchen labetalol (NORMODYNE) 300 MG tablet  Take 300 mg by mouth 2 (two) times daily.      Marland Kitchen lisinopril (PRINIVIL,ZESTRIL) 10 MG tablet Take 1 tablet (10 mg total) by mouth daily.  30 tablet  6  . metFORMIN (GLUCOPHAGE) 500 MG tablet Take 500 mg by mouth 3 (three) times daily.      . nitroGLYCERIN (NITROSTAT) 0.4 MG SL tablet Place 0.4 mg under the tongue every 5 (five) minutes as needed for chest pain.      . traMADol (ULTRAM) 50 MG tablet Take 50 mg by mouth 2 (two) times daily.        No current facility-administered medications for this visit.     Past Surgical History  Procedure Laterality Date  . Eye surgery  2002    Cataract  . Eye surgery  2009    Laser bilateral   . Coronary angioplasty with stent placement  01/19/12    "1; first one I've ever had"  . Tonsillectomy and adenoidectomy  1946  . Posterior laminectomy / decompression lumbar spine  1989  . Peripheral arterial stent graft  2003    LLE  . Cataract extraction w/ intraocular lens  implant, bilateral  2002  . Refractive surgery  2009    bilaterally  . Colonoscopy    . Femoral-popliteal bypass graft  03/23/2012    Procedure: BYPASS GRAFT FEMORAL-POPLITEAL ARTERY;  Surgeon: Elam Dutch, MD;  Location: [facility] OR;  Service: Vascular;  Laterality: Right;  Right Femoral endarterectomy with profundaplasty; right femoral-popliteal bypass with nonreversed saphenous vein; and right first toe amputation  . Amputation  03/23/2012    Procedure: AMPUTATION DIGIT;  Surgeon: Elam Dutch, MD;  Location: [facility] OR;  Service: Vascular;  Laterality: Right;  Right Femoral endarterectomy with profundaplasty; right femoral-popliteal bypass with nonreversed saphenous vein; and right first toe amputation  . Femoral endarterectomy  03/23/2012    right     Allergies  Allergen Reactions  . Aspirin Other (See Comments)    Dr. Britta Mccreedy informed patient not to take this due to ulcer  . Ibuprofen Other (See Comments)    Dr. Britta Mccreedy informed patient not to take this due to ulcer        Family History  Problem Relation Age of Onset  . Stroke Father 74  . Stroke Mother 22     Social History Cynthia Ray reports that she quit smoking about 26 years ago. Her smoking use included Cigarettes. She has a 24 pack-year smoking history. She has never used smokeless tobacco. Cynthia Ray reports that she does not drink alcohol.   Review of Systems CONSTITUTIONAL: No weight loss, fever, chills, weakness or fatigue.  HEENT: Eyes: No visual loss, blurred vision, double vision or yellow sclerae.No hearing loss, sneezing, congestion, runny nose or sore throat.  SKIN: No rash or itching.  CARDIOVASCULAR: per HPI RESPIRATORY: No shortness of breath, cough or sputum.  GASTROINTESTINAL: No anorexia, nausea, vomiting or diarrhea. No abdominal pain or blood.  GENITOURINARY: No burning on urination, no polyuria NEUROLOGICAL: No headache, dizziness, syncope, paralysis, ataxia, numbness or tingling in the extremities. No change in bowel or bladder control.  MUSCULOSKELETAL: No muscle, back pain, joint pain or stiffness.  LYMPHATICS: No enlarged nodes. No history of splenectomy.  PSYCHIATRIC: No history of depression or anxiety.  ENDOCRINOLOGIC: No reports of sweating, cold or heat intolerance. No polyuria or polydipsia.  Marland Kitchen   Physical Examination Filed Vitals:   08/17/13 1152  BP: 213/76  Pulse: 87   Filed Weights   08/17/13 1140  Weight: 145 lb (65.772 kg)    Gen: resting comfortably, no acute distress HEENT: no scleral icterus, pupils equal round and reactive, no palptable cervical adenopathy,  CV: RRR, 2/6 systolic murmur RUSB, no JVD, no carotid bruits Resp: Clear to auscultation bilaterally GI: abdomen is soft, non-tender, non-distended, normal bowel sounds, no hepatosplenomegaly MSK: extremities are warm, 1-2+ bilateral edema.  Skin: warm, no rash Neuro:  no focal deficits Psych: appropriate affect   Diagnostic Studies 12/2011 Stress MPI: inferior wall ischemia, normal  LVEF  12/2011 Cath  Procedural Findings:  Hemodynamics:  AO 179/62  LV 192/12  Coronary angiography:  Coronary dominance: Right  Left mainstem: Moderately calcified. Distal 60%.  Left anterior descending (LAD): Probable ostial 60% (difficult to fully lay out the ostial circ/LAD/RI).  Ramus Intermedius: Moderate sized. It appears to be free of high grade disease although it is difficult to visualize the ostium.  Left circumflex (LCx): AV groove ostial 40%. OM1 moderate with ostial 50%. OM2 moderate and branching without high grade disease  Right coronary artery (RCA): Large. Heavy calcification. Long proximal 30%. Focal mid 99% followed by 95%. PDA small to moderate and  normal. PL x 2 small to moderate and normal.  Left ventriculography: Left ventricular systolic function is normal, LVEF is estimated at 65%, there is no significant mitral regurgitation  Final Conclusions: Heavy valve calcification. Severe single vessel CAD with moderate disease elsewhere  Recommendations: I will review with colleagues for consideration of PCI of the RCA before possible lower extremity surgery.   Lesion Data:  Vessel: Mid RCA  Percent stenosis (pre): 90%  TIMI-flow (pre): 3  Stent: 2.5 x 23 mm MultiLink mini vision stent  Percent stenosis (post): 0%  TIMI-flow (post): 3  Conclusions:  1. Successful intracoronary stenting of the mid right coronary with a bare-metal stent. Unable to cross the lesion at the crux.  06/15/13 Clinic EKG: sinus rhythm, normal axis, no ischemic changes  06/2013 Echo  LVEF 65-60%, mild LVH, grade I diastolic dysfunction, aortic sclerosis without stenosis,    Assessment and Plan   1. CAD  - no current symptoms  - continue current medications and risk factor modfication   2. Hyperlipidemia  - will request recent lipid panel from her PCP, continue current statin   3. HTN  - above goal, given her history of DM her goal blood pressure is < 130/80  - will increase her  chlorthalidone to 50mg  daily given continued HTN and also LE edema - check BMET in 3 weeks.  - asked to keep bp log and present in 3 weeks  4. PAD  - she will continue to follow with vascular, she has ultrasounds planned for the lower extremities and the carotids   5. Heart murmur  - echo shows aortic sclerosis without stenosis, continue to follow clinically.     Follow up 3 months    Arnoldo Lenis, M.D., F.A.C.C.

## 2013-08-17 NOTE — Patient Instructions (Signed)
   Increase Chlorthalidone to 50mg  daily - new sent to pharm (can take 2 tabs of your 25mg  daily till finish current supply) Continue all other medications.   Keep blood pressure log x 3 weeks & return to office for MD review.  Lab for BMET - due in 3 weeks  Office will contact with results via phone or letter.    Follow up in  3 months

## 2013-09-06 DIAGNOSIS — I10 Essential (primary) hypertension: Secondary | ICD-10-CM | POA: Diagnosis not present

## 2013-09-06 DIAGNOSIS — Z0000 Encounter for general adult medical examination without abnormal findings: Secondary | ICD-10-CM | POA: Diagnosis not present

## 2013-09-06 DIAGNOSIS — IMO0001 Reserved for inherently not codable concepts without codable children: Secondary | ICD-10-CM | POA: Diagnosis not present

## 2013-09-06 DIAGNOSIS — E782 Mixed hyperlipidemia: Secondary | ICD-10-CM | POA: Diagnosis not present

## 2013-09-26 DIAGNOSIS — E119 Type 2 diabetes mellitus without complications: Secondary | ICD-10-CM | POA: Diagnosis not present

## 2013-09-26 DIAGNOSIS — E1149 Type 2 diabetes mellitus with other diabetic neurological complication: Secondary | ICD-10-CM | POA: Diagnosis not present

## 2013-10-12 ENCOUNTER — Telehealth: Payer: Self-pay | Admitting: Cardiology

## 2013-10-12 NOTE — Telephone Encounter (Signed)
Called pt to see if lab work had been completed. Pt informed me that she had lab work completed at Dr. Sherrie Sport office. Pt also stated that she would mail in her BP log readings.

## 2013-10-14 ENCOUNTER — Telehealth: Payer: Self-pay | Admitting: Cardiology

## 2013-10-14 NOTE — Telephone Encounter (Signed)
Pt brought BP log into office. Placed bp log into basket.

## 2013-10-18 ENCOUNTER — Telehealth: Payer: Self-pay | Admitting: Cardiology

## 2013-10-18 NOTE — Telephone Encounter (Signed)
Message copied by Lewayne Bunting on Tue Oct 18, 2013 10:50 AM ------      Message from: [place] F      Created: Mon Oct 17, 2013 11:20 AM       Please let patient know that bp log shows elevated bp. Verify her dose of lisinopril at 10mg  daily and increase to 20mg  daily.                   Carlyle Dolly MD ------

## 2013-10-18 NOTE — Telephone Encounter (Signed)
Called pt and informed pt of BP log results. Pt verified lisinopril dose as 20 MG Dr. Sherrie Sport increase dose of medicine on 09-06-2013. I have updated dose increase in epic.

## 2013-11-23 ENCOUNTER — Ambulatory Visit (INDEPENDENT_AMBULATORY_CARE_PROVIDER_SITE_OTHER): Payer: Medicare Other | Admitting: Cardiology

## 2013-11-23 VITALS — BP 170/90 | HR 102 | Ht 62.0 in | Wt 144.1 lb

## 2013-11-23 DIAGNOSIS — I10 Essential (primary) hypertension: Secondary | ICD-10-CM | POA: Diagnosis not present

## 2013-11-23 DIAGNOSIS — E785 Hyperlipidemia, unspecified: Secondary | ICD-10-CM

## 2013-11-23 DIAGNOSIS — I2510 Atherosclerotic heart disease of native coronary artery without angina pectoris: Secondary | ICD-10-CM

## 2013-11-23 MED ORDER — LISINOPRIL 40 MG PO TABS
40.0000 mg | ORAL_TABLET | Freq: Every day | ORAL | Status: DC
Start: 2013-11-23 — End: 2015-03-07

## 2013-11-23 MED ORDER — FUROSEMIDE 40 MG PO TABS
40.0000 mg | ORAL_TABLET | Freq: Every day | ORAL | Status: DC
Start: 2013-11-23 — End: 2014-03-22

## 2013-11-23 NOTE — Patient Instructions (Signed)
Your physician recommends that you schedule a follow-up appointment in: 4 months with Dr. Harl Bowie. This appointment will be scheduled today before you leave.   Your physician has recommended you make the following change in your medication:  Stop: Chlorthalidone Start: Furosemide (Lasix) 40 MG take 1 tablet by mouth once daily Increase: Lisinopril 40 MG take 1 tablet by mouth once daily  Continue all other medications the same.   Your physician recommends that you return for lab work in: 2 weeks (Around 12-07-2013) for BMET  You may go to one of the following locations to have lab work completed: [facility] [address] Dr. Edrick Oh office in [facility].

## 2013-11-23 NOTE — Progress Notes (Signed)
Clinical Summary Cynthia Ray is a 80 y.o.female seen today for follow up of the following medical problems.   1. CAD  - referred for MPI 12/2011 as part of preoperative vascular surgery evaluation, found to have inferior wall ischemia with normal LVEF  - referred for cath, received a BMS to her RCA   - denies any chest pain recently. Denies any SOB or DOE, fairly sedentary lifestyle because of chronic leg pain.  - compliant with meds  - she is not on ASA because of prior anemia and GI bleed   2. HTN  - checks regularly, usually ranges 150-187/60-90s based on her bp log  - compliant with meds   3. Hyperlipidemia  - compliant with atorvastatin  -08/2013 TC 154 TG 108 HDL 65 LDL 67  4. PAD  - followed by vascular, prior stenting, prior fem-pop bypass Aug 2013   Past Medical History  Diagnosis Date  . Hyperlipidemia   . Hypertension   . Carotid artery occlusion   . PVD (peripheral vascular disease)     Stents left SFA 2004 Dr. Albertine Patricia  . Anemia   . GERD (gastroesophageal reflux disease)   . Type II diabetes mellitus   . History of blood transfusion 11/11/11  . Arthritis     "dr said I have it in my back" Lower      Allergies  Allergen Reactions  . Aspirin Other (See Comments)    Dr. Britta Mccreedy informed patient not to take this due to ulcer  . Ibuprofen Other (See Comments)    Dr. Britta Mccreedy informed patient not to take this due to ulcer      Current Outpatient Prescriptions  Medication Sig Dispense Refill  . acetaminophen (TYLENOL) 500 MG tablet Take 1,000 mg by mouth every 6 (six) hours as needed.      Marland Kitchen atorvastatin (LIPITOR) 80 MG tablet Take 80 mg by mouth daily.      . chlorthalidone (HYGROTON) 50 MG tablet Take 1 tablet (50 mg total) by mouth daily.  30 tablet  6  . glipiZIDE (GLUCOTROL) 5 MG tablet Take 5 mg by mouth 2 (two) times daily before a meal.      . IRON PO Take by mouth 2 (two) times daily.      Marland Kitchen labetalol (NORMODYNE) 300 MG tablet Take 300 mg by mouth 2  (two) times daily.      Marland Kitchen lisinopril (PRINIVIL,ZESTRIL) 20 MG tablet Take 20 mg by mouth daily.      . metFORMIN (GLUCOPHAGE) 500 MG tablet Take 500 mg by mouth 3 (three) times daily.      . nitroGLYCERIN (NITROSTAT) 0.4 MG SL tablet Place 0.4 mg under the tongue every 5 (five) minutes as needed for chest pain.      . traMADol (ULTRAM) 50 MG tablet Take 50 mg by mouth 2 (two) times daily.        No current facility-administered medications for this visit.     Past Surgical History  Procedure Laterality Date  . Eye surgery  2002    Cataract  . Eye surgery  2009    Laser bilateral   . Coronary angioplasty with stent placement  01/19/12    "1; first one I've ever had"  . Tonsillectomy and adenoidectomy  1946  . Posterior laminectomy / decompression lumbar spine  1989  . Peripheral arterial stent graft  2003    LLE  . Cataract extraction w/ intraocular lens  implant, bilateral  2002  .  Refractive surgery  2009    bilaterally  . Colonoscopy    . Femoral-popliteal bypass graft  03/23/2012    Procedure: BYPASS GRAFT FEMORAL-POPLITEAL ARTERY;  Surgeon: Elam Dutch, MD;  Location: [facility] OR;  Service: Vascular;  Laterality: Right;  Right Femoral endarterectomy with profundaplasty; right femoral-popliteal bypass with nonreversed saphenous vein; and right first toe amputation  . Amputation  03/23/2012    Procedure: AMPUTATION DIGIT;  Surgeon: Elam Dutch, MD;  Location: [facility] OR;  Service: Vascular;  Laterality: Right;  Right Femoral endarterectomy with profundaplasty; right femoral-popliteal bypass with nonreversed saphenous vein; and right first toe amputation  . Femoral endarterectomy  03/23/2012    right     Allergies  Allergen Reactions  . Aspirin Other (See Comments)    Dr. Britta Mccreedy informed patient not to take this due to ulcer  . Ibuprofen Other (See Comments)    Dr. Britta Mccreedy informed patient not to take this due to ulcer       Family History  Problem Relation Age of Onset  .  Stroke Father 62  . Stroke Mother 62     Social History Ms. Meadow reports that she quit smoking about 26 years ago. Her smoking use included Cigarettes. She has a 24 pack-year smoking history. She has never used smokeless tobacco. Ms. Shampine reports that she does not drink alcohol.   Review of Systems CONSTITUTIONAL: No weight loss, fever, chills, weakness or fatigue.  HEENT: Eyes: No visual loss, blurred vision, double vision or yellow sclerae.No hearing loss, sneezing, congestion, runny nose or sore throat.  SKIN: No rash or itching.  CARDIOVASCULAR: per HPI RESPIRATORY: No shortness of breath, cough or sputum.  GASTROINTESTINAL: No anorexia, nausea, vomiting or diarrhea. No abdominal pain or blood.  GENITOURINARY: No burning on urination, no polyuria NEUROLOGICAL: No headache, dizziness, syncope, paralysis, ataxia, numbness or tingling in the extremities. No change in bowel or bladder control.  MUSCULOSKELETAL: No muscle, back pain, joint pain or stiffness.  LYMPHATICS: No enlarged nodes. No history of splenectomy.  PSYCHIATRIC: No history of depression or anxiety.  ENDOCRINOLOGIC: No reports of sweating, cold or heat intolerance. No polyuria or polydipsia.  Marland Kitchen   Physical Examination p 102 bp 170/90 Wt 144 lbs BMI 26 Gen: resting comfortably, no acute distress HEENT: no scleral icterus, pupils equal round and reactive, no palptable cervical adenopathy,  CV: RRR, 2/6 systolic murmur RUSB, no JVD, no carotid bruits Resp: Clear to auscultation bilaterally GI: abdomen is soft, non-tender, non-distended, normal bowel sounds, no hepatosplenomegaly MSK: extremities are warm, 2+ LE edema Skin: warm, no rash Neuro:  no focal deficits Psych: appropriate affect   Diagnostic Studies  12/2011 Stress MPI: inferior wall ischemia, normal LVEF   12/2011 Cath  Procedural Findings:  Hemodynamics:  AO 179/62  LV 192/12  Coronary angiography:  Coronary dominance: Right  Left mainstem:  Moderately calcified. Distal 60%.  Left anterior descending (LAD): Probable ostial 60% (difficult to fully lay out the ostial circ/LAD/RI).  Ramus Intermedius: Moderate sized. It appears to be free of high grade disease although it is difficult to visualize the ostium.  Left circumflex (LCx): AV groove ostial 40%. OM1 moderate with ostial 50%. OM2 moderate and branching without high grade disease  Right coronary artery (RCA): Large. Heavy calcification. Long proximal 30%. Focal mid 99% followed by 95%. PDA small to moderate and normal. PL x 2 small to moderate and normal.  Left ventriculography: Left ventricular systolic function is normal, LVEF is estimated at 65%, there is  no significant mitral regurgitation  Final Conclusions: Heavy valve calcification. Severe single vessel CAD with moderate disease elsewhere  Recommendations: I will review with colleagues for consideration of PCI of the RCA before possible lower extremity surgery.   Lesion Data:  Vessel: Mid RCA  Percent stenosis (pre): 90%  TIMI-flow (pre): 3  Stent: 2.5 x 23 mm MultiLink mini vision stent  Percent stenosis (post): 0%  TIMI-flow (post): 3  Conclusions:  1. Successful intracoronary stenting of the mid right coronary with a bare-metal stent. Unable to cross the lesion at the crux.   06/15/13 Clinic EKG: sinus rhythm, normal axis, no ischemic changes   06/2013 Echo  LVEF 65-60%, mild LVH, grade I diastolic dysfunction, aortic sclerosis without stenosis    Assessment and Plan   1. CAD  - no current symptoms  - continue current medications and risk factor modfication   2. Hyperlipidemia  - lipids at goal, continue current meds  3. HTN  - above goal, given her history of DM her goal blood pressure is < 130/80  - will increase lisinpril to 40mg  daily, check BMET in 2 weeks.  - given BP log to take with her to her next pcp visit next month  4. PAD  - she will continue to follow with vascular  5. LE edema -  likely related to diastolic dysfunction, change chlorthalidone to lasix 40mg  daily. Check BMET in 2 weeks   F/u 4 months      Arnoldo Lenis, M.D., F.A.C.C.

## 2013-12-02 DIAGNOSIS — I10 Essential (primary) hypertension: Secondary | ICD-10-CM | POA: Diagnosis not present

## 2013-12-02 DIAGNOSIS — K219 Gastro-esophageal reflux disease without esophagitis: Secondary | ICD-10-CM | POA: Diagnosis not present

## 2013-12-04 DIAGNOSIS — I10 Essential (primary) hypertension: Secondary | ICD-10-CM | POA: Diagnosis not present

## 2013-12-04 DIAGNOSIS — R4789 Other speech disturbances: Secondary | ICD-10-CM | POA: Diagnosis not present

## 2013-12-04 DIAGNOSIS — E119 Type 2 diabetes mellitus without complications: Secondary | ICD-10-CM | POA: Diagnosis not present

## 2013-12-04 DIAGNOSIS — S0990XA Unspecified injury of head, initial encounter: Secondary | ICD-10-CM | POA: Diagnosis not present

## 2013-12-04 DIAGNOSIS — E876 Hypokalemia: Secondary | ICD-10-CM | POA: Diagnosis not present

## 2013-12-04 DIAGNOSIS — I6789 Other cerebrovascular disease: Secondary | ICD-10-CM | POA: Diagnosis not present

## 2013-12-04 DIAGNOSIS — E871 Hypo-osmolality and hyponatremia: Secondary | ICD-10-CM | POA: Diagnosis not present

## 2013-12-04 DIAGNOSIS — R5381 Other malaise: Secondary | ICD-10-CM | POA: Diagnosis not present

## 2013-12-04 DIAGNOSIS — S298XXA Other specified injuries of thorax, initial encounter: Secondary | ICD-10-CM | POA: Diagnosis not present

## 2013-12-04 DIAGNOSIS — T502X5A Adverse effect of carbonic-anhydrase inhibitors, benzothiadiazides and other diuretics, initial encounter: Secondary | ICD-10-CM | POA: Diagnosis not present

## 2013-12-04 DIAGNOSIS — Z87891 Personal history of nicotine dependence: Secondary | ICD-10-CM | POA: Diagnosis not present

## 2013-12-04 DIAGNOSIS — F4489 Other dissociative and conversion disorders: Secondary | ICD-10-CM | POA: Diagnosis not present

## 2013-12-04 DIAGNOSIS — I739 Peripheral vascular disease, unspecified: Secondary | ICD-10-CM | POA: Diagnosis not present

## 2013-12-04 DIAGNOSIS — R5383 Other fatigue: Secondary | ICD-10-CM | POA: Diagnosis not present

## 2013-12-04 DIAGNOSIS — E7800 Pure hypercholesterolemia, unspecified: Secondary | ICD-10-CM | POA: Diagnosis not present

## 2013-12-04 DIAGNOSIS — I509 Heart failure, unspecified: Secondary | ICD-10-CM | POA: Diagnosis not present

## 2013-12-04 DIAGNOSIS — Z886 Allergy status to analgesic agent status: Secondary | ICD-10-CM | POA: Diagnosis not present

## 2013-12-04 DIAGNOSIS — Z79899 Other long term (current) drug therapy: Secondary | ICD-10-CM | POA: Diagnosis not present

## 2013-12-04 DIAGNOSIS — N390 Urinary tract infection, site not specified: Secondary | ICD-10-CM | POA: Diagnosis not present

## 2013-12-05 DIAGNOSIS — T502X5A Adverse effect of carbonic-anhydrase inhibitors, benzothiadiazides and other diuretics, initial encounter: Secondary | ICD-10-CM | POA: Diagnosis not present

## 2013-12-05 DIAGNOSIS — N390 Urinary tract infection, site not specified: Secondary | ICD-10-CM | POA: Diagnosis not present

## 2013-12-05 DIAGNOSIS — E871 Hypo-osmolality and hyponatremia: Secondary | ICD-10-CM | POA: Diagnosis not present

## 2013-12-05 DIAGNOSIS — E876 Hypokalemia: Secondary | ICD-10-CM | POA: Diagnosis not present

## 2013-12-05 DIAGNOSIS — Z136 Encounter for screening for cardiovascular disorders: Secondary | ICD-10-CM | POA: Diagnosis not present

## 2013-12-13 DIAGNOSIS — E119 Type 2 diabetes mellitus without complications: Secondary | ICD-10-CM | POA: Diagnosis not present

## 2013-12-13 DIAGNOSIS — E1149 Type 2 diabetes mellitus with other diabetic neurological complication: Secondary | ICD-10-CM | POA: Diagnosis not present

## 2013-12-14 DIAGNOSIS — E871 Hypo-osmolality and hyponatremia: Secondary | ICD-10-CM | POA: Diagnosis not present

## 2014-02-13 DIAGNOSIS — E11311 Type 2 diabetes mellitus with unspecified diabetic retinopathy with macular edema: Secondary | ICD-10-CM | POA: Diagnosis not present

## 2014-02-13 DIAGNOSIS — H43819 Vitreous degeneration, unspecified eye: Secondary | ICD-10-CM | POA: Diagnosis not present

## 2014-02-13 DIAGNOSIS — E1139 Type 2 diabetes mellitus with other diabetic ophthalmic complication: Secondary | ICD-10-CM | POA: Diagnosis not present

## 2014-02-21 DIAGNOSIS — E119 Type 2 diabetes mellitus without complications: Secondary | ICD-10-CM | POA: Diagnosis not present

## 2014-02-21 DIAGNOSIS — E1149 Type 2 diabetes mellitus with other diabetic neurological complication: Secondary | ICD-10-CM | POA: Diagnosis not present

## 2014-03-06 DIAGNOSIS — IMO0001 Reserved for inherently not codable concepts without codable children: Secondary | ICD-10-CM | POA: Diagnosis not present

## 2014-03-06 DIAGNOSIS — I10 Essential (primary) hypertension: Secondary | ICD-10-CM | POA: Diagnosis not present

## 2014-03-08 DIAGNOSIS — E782 Mixed hyperlipidemia: Secondary | ICD-10-CM | POA: Diagnosis not present

## 2014-03-08 DIAGNOSIS — IMO0001 Reserved for inherently not codable concepts without codable children: Secondary | ICD-10-CM | POA: Diagnosis not present

## 2014-03-22 ENCOUNTER — Ambulatory Visit (INDEPENDENT_AMBULATORY_CARE_PROVIDER_SITE_OTHER): Payer: Medicare Other | Admitting: Cardiology

## 2014-03-22 ENCOUNTER — Encounter: Payer: Self-pay | Admitting: Cardiology

## 2014-03-22 VITALS — BP 195/81 | HR 70 | Ht 62.0 in | Wt 140.0 lb

## 2014-03-22 DIAGNOSIS — E785 Hyperlipidemia, unspecified: Secondary | ICD-10-CM | POA: Diagnosis not present

## 2014-03-22 DIAGNOSIS — I10 Essential (primary) hypertension: Secondary | ICD-10-CM

## 2014-03-22 DIAGNOSIS — I2510 Atherosclerotic heart disease of native coronary artery without angina pectoris: Secondary | ICD-10-CM

## 2014-03-22 MED ORDER — AMLODIPINE BESYLATE 5 MG PO TABS
5.0000 mg | ORAL_TABLET | Freq: Every day | ORAL | Status: DC
Start: 2014-03-22 — End: 2014-09-08

## 2014-03-22 NOTE — Progress Notes (Signed)
Clinical Summary Cynthia Ray is a 80 y.o.female seen today for follow up of the following medical problems.   1. CAD  - referred for MPI 12/2011 as part of preoperative vascular surgery evaluation, found to have inferior wall ischemia with normal LVEF  - referred for cath, received a BMS to her RCA  - denies any chest pain recently. Denies any SOB or DOE, fairly sedentary lifestyle because of chronic leg pain.  - compliant with meds  - she is not on ASA because of prior anemia and GI bleed   2. HTN  - checks regularly, usually ranges 170s/80s based on her bp log  - compliant with meds   3. Hyperlipidemia  - compliant with atorvastatin  -08/2013 TC 154 TG 108 HDL 65 LDL 67   4. PAD  - followed by vascular, prior stenting, prior fem-pop bypass Aug 2013  5. LE edema - likely related to diastolic dysfunction - last visit changed her thiazide diuretic to daily lasix - seen at [facility], family reports electrolytes were off. K was 3.0 at that time and Na 125, Mg 0.8. Since that time lasix has been stopped - no recent issues with swelling   Past Medical History  Diagnosis Date  . Hyperlipidemia   . Hypertension   . Carotid artery occlusion   . PVD (peripheral vascular disease)     Stents left SFA 2004 Dr. Albertine Patricia  . Anemia   . GERD (gastroesophageal reflux disease)   . Type II diabetes mellitus   . History of blood transfusion 11/11/11  . Arthritis     "dr said I have it in my back" Lower      Allergies  Allergen Reactions  . Aspirin Other (See Comments)    Cynthia Ray informed patient not to take this due to ulcer  . Ibuprofen Other (See Comments)    Cynthia Ray informed patient not to take this due to ulcer      Current Outpatient Prescriptions  Medication Sig Dispense Refill  . acetaminophen (TYLENOL) 500 MG tablet Take 1,000 mg by mouth every 6 (six) hours as needed.      Marland Kitchen atorvastatin (LIPITOR) 80 MG tablet Take 80 mg by mouth daily.      . furosemide  (LASIX) 40 MG tablet Take 1 tablet (40 mg total) by mouth daily.  30 tablet  6  . glipiZIDE (GLUCOTROL) 5 MG tablet Take 5 mg by mouth 2 (two) times daily before a meal.      . IRON PO Take by mouth 2 (two) times daily.      Marland Kitchen JANUVIA 100 MG tablet Take 100 mg by mouth daily.      Marland Kitchen labetalol (NORMODYNE) 300 MG tablet Take 300 mg by mouth 2 (two) times daily.      Marland Kitchen lisinopril (PRINIVIL,ZESTRIL) 40 MG tablet Take 1 tablet (40 mg total) by mouth daily.  30 tablet  6  . metFORMIN (GLUCOPHAGE) 500 MG tablet Take 500 mg by mouth 3 (three) times daily.      . nitroGLYCERIN (NITROSTAT) 0.4 MG SL tablet Place 0.4 mg under the tongue every 5 (five) minutes as needed for chest pain.      . traMADol (ULTRAM) 50 MG tablet Take 50 mg by mouth 2 (two) times daily.        No current facility-administered medications for this visit.     Past Surgical History  Procedure Laterality Date  . Eye surgery  2002  Cataract  . Eye surgery  2009    Laser bilateral   . Coronary angioplasty with stent placement  01/19/12    "1; first one I've ever had"  . Tonsillectomy and adenoidectomy  1946  . Posterior laminectomy / decompression lumbar spine  1989  . Peripheral arterial stent graft  2003    LLE  . Cataract extraction w/ intraocular lens  implant, bilateral  2002  . Refractive surgery  2009    bilaterally  . Colonoscopy    . Femoral-popliteal bypass graft  03/23/2012    Procedure: BYPASS GRAFT FEMORAL-POPLITEAL ARTERY;  Surgeon: Elam Dutch, MD;  Location: [facility] OR;  Service: Vascular;  Laterality: Right;  Right Femoral endarterectomy with profundaplasty; right femoral-popliteal bypass with nonreversed saphenous vein; and right first toe amputation  . Amputation  03/23/2012    Procedure: AMPUTATION DIGIT;  Surgeon: Elam Dutch, MD;  Location: [facility] OR;  Service: Vascular;  Laterality: Right;  Right Femoral endarterectomy with profundaplasty; right femoral-popliteal bypass with nonreversed saphenous vein;  and right first toe amputation  . Femoral endarterectomy  03/23/2012    right     Allergies  Allergen Reactions  . Aspirin Other (See Comments)    Cynthia Ray informed patient not to take this due to ulcer  . Ibuprofen Other (See Comments)    Cynthia Ray informed patient not to take this due to ulcer       Family History  Problem Relation Age of Onset  . Stroke Father 49  . Stroke Mother 27     Social History Ms. Kops reports that she quit smoking about 26 years ago. Her smoking use included Cigarettes. She has a 24 pack-year smoking history. She has never used smokeless tobacco. Ms. Gal reports that she does not drink alcohol.   Review of Systems CONSTITUTIONAL: No weight loss, fever, chills, weakness or fatigue.  HEENT: Eyes: No visual loss, blurred vision, double vision or yellow sclerae.No hearing loss, sneezing, congestion, runny nose or sore throat.  SKIN: No rash or itching.  CARDIOVASCULAR: per HPI RESPIRATORY: No shortness of breath, cough or sputum.  GASTROINTESTINAL: No anorexia, nausea, vomiting or diarrhea. No abdominal pain or blood.  GENITOURINARY: No burning on urination, no polyuria NEUROLOGICAL: No headache, dizziness, syncope, paralysis, ataxia, numbness or tingling in the extremities. No change in bowel or bladder control.  MUSCULOSKELETAL: No muscle, back pain, joint pain or stiffness.  LYMPHATICS: No enlarged nodes. No history of splenectomy.  PSYCHIATRIC: No history of depression or anxiety.  ENDOCRINOLOGIC: No reports of sweating, cold or heat intolerance. No polyuria or polydipsia.  Marland Kitchen   Physical Examination p 70 bp 160/80 Wt 140 lbs BMI 26 Gen: resting comfortably, no acute distress HEENT: no scleral icterus, pupils equal round and reactive, no palptable cervical adenopathy,  CV: RRR, no m/r/g, no JVD, no carotid bruits Resp: Clear to auscultation bilaterally GI: abdomen is soft, non-tender, non-distended, normal bowel sounds, no  hepatosplenomegaly MSK: extremities are warm, no edema.  Skin: warm, no rash Neuro:  no focal deficits Psych: appropriate affect   Diagnostic Studies  12/2011 Stress MPI: inferior wall ischemia, normal LVEF   12/2011 Cath  Procedural Findings:  Hemodynamics:  AO 179/62  LV 192/12  Coronary angiography:  Coronary dominance: Right  Left mainstem: Moderately calcified. Distal 60%.  Left anterior descending (LAD): Probable ostial 60% (difficult to fully lay out the ostial circ/LAD/RI).  Ramus Intermedius: Moderate sized. It appears to be free of high grade disease although it is difficult  to visualize the ostium.  Left circumflex (LCx): AV groove ostial 40%. OM1 moderate with ostial 50%. OM2 moderate and branching without high grade disease  Right coronary artery (RCA): Large. Heavy calcification. Long proximal 30%. Focal mid 99% followed by 95%. PDA small to moderate and normal. PL x 2 small to moderate and normal.  Left ventriculography: Left ventricular systolic function is normal, LVEF is estimated at 65%, there is no significant mitral regurgitation  Final Conclusions: Heavy valve calcification. Severe single vessel CAD with moderate disease elsewhere  Recommendations: I will review with colleagues for consideration of PCI of the RCA before possible lower extremity surgery.   Lesion Data:  Vessel: Mid RCA  Percent stenosis (pre): 90%  TIMI-flow (pre): 3  Stent: 2.5 x 23 mm MultiLink mini vision stent  Percent stenosis (post): 0%  TIMI-flow (post): 3  Conclusions:  1. Successful intracoronary stenting of the mid right coronary with a bare-metal stent. Unable to cross the lesion at the crux.   06/15/13 Clinic EKG: sinus rhythm, normal axis, no ischemic changes   06/2013 Echo  LVEF 65-60%, mild LVH, grade I diastolic dysfunction, aortic sclerosis without stenosis    Assessment and Plan  1. CAD  - no current symptoms  - continue current medications and risk factor  modfication   2. Hyperlipidemia  - lipids at goal, continue current meds   3. HTN  - above goal, given her history of DM her goal blood pressure is < 130/80  - will start norvasc 5mg  daily, she will drop off bp log in 2 weeks  4. PAD  - she will continue to follow with vascular   5. LE edema  - improved, has not required diuretic - daily lasix caused significant electrolyte abnormalities, if needed would only prescribe on prn basis   F/u 6 months, submit bp log in 2 weeks with neccesary medication adjustments at that time.    Arnoldo Lenis, M.D., F.A.C.C.

## 2014-03-22 NOTE — Patient Instructions (Signed)
   Begin Norvasc 5mg  daily - new sent to pharm Continue all other medications.   Your physician has requested that you regularly monitor and record your blood pressure readings at home. Please take readings approximately 2 hours after medication x 2 weeks & return office for MD review. Your physician wants you to follow up in: 6 months.  You will receive a reminder letter in the mail one-two months in advance.  If you don't receive a letter, please call our office to schedule the follow up appointment

## 2014-05-09 DIAGNOSIS — E1151 Type 2 diabetes mellitus with diabetic peripheral angiopathy without gangrene: Secondary | ICD-10-CM | POA: Diagnosis not present

## 2014-05-09 DIAGNOSIS — E1140 Type 2 diabetes mellitus with diabetic neuropathy, unspecified: Secondary | ICD-10-CM | POA: Diagnosis not present

## 2014-06-06 DIAGNOSIS — E1165 Type 2 diabetes mellitus with hyperglycemia: Secondary | ICD-10-CM | POA: Diagnosis not present

## 2014-06-06 DIAGNOSIS — I10 Essential (primary) hypertension: Secondary | ICD-10-CM | POA: Diagnosis not present

## 2014-06-06 DIAGNOSIS — E1065 Type 1 diabetes mellitus with hyperglycemia: Secondary | ICD-10-CM | POA: Diagnosis not present

## 2014-06-06 DIAGNOSIS — R1030 Lower abdominal pain, unspecified: Secondary | ICD-10-CM | POA: Diagnosis not present

## 2014-06-14 DIAGNOSIS — Z23 Encounter for immunization: Secondary | ICD-10-CM | POA: Diagnosis not present

## 2014-06-21 DIAGNOSIS — R194 Change in bowel habit: Secondary | ICD-10-CM | POA: Diagnosis not present

## 2014-07-04 DIAGNOSIS — Z79899 Other long term (current) drug therapy: Secondary | ICD-10-CM | POA: Diagnosis not present

## 2014-07-04 DIAGNOSIS — I10 Essential (primary) hypertension: Secondary | ICD-10-CM | POA: Diagnosis not present

## 2014-07-04 DIAGNOSIS — R194 Change in bowel habit: Secondary | ICD-10-CM | POA: Diagnosis not present

## 2014-07-04 DIAGNOSIS — E119 Type 2 diabetes mellitus without complications: Secondary | ICD-10-CM | POA: Diagnosis not present

## 2014-07-04 DIAGNOSIS — R1030 Lower abdominal pain, unspecified: Secondary | ICD-10-CM | POA: Diagnosis not present

## 2014-07-04 DIAGNOSIS — K5730 Diverticulosis of large intestine without perforation or abscess without bleeding: Secondary | ICD-10-CM | POA: Diagnosis not present

## 2014-07-06 ENCOUNTER — Encounter (HOSPITAL_COMMUNITY): Payer: Self-pay | Admitting: Vascular Surgery

## 2014-07-12 ENCOUNTER — Encounter: Payer: Self-pay | Admitting: Vascular Surgery

## 2014-07-13 ENCOUNTER — Ambulatory Visit (INDEPENDENT_AMBULATORY_CARE_PROVIDER_SITE_OTHER)
Admission: RE | Admit: 2014-07-13 | Discharge: 2014-07-13 | Disposition: A | Discharge status: Home / Self Care | Payer: Medicare Other | Source: Ambulatory Visit | Attending: Vascular Surgery | Admitting: Vascular Surgery

## 2014-07-13 ENCOUNTER — Ambulatory Visit (INDEPENDENT_AMBULATORY_CARE_PROVIDER_SITE_OTHER): Payer: Medicare Other | Admitting: Vascular Surgery

## 2014-07-13 ENCOUNTER — Encounter: Payer: Self-pay | Admitting: Vascular Surgery

## 2014-07-13 ENCOUNTER — Ambulatory Visit (HOSPITAL_COMMUNITY)
Admission: RE | Admit: 2014-07-13 | Discharge: 2014-07-13 | Disposition: A | Discharge status: Home / Self Care | Payer: Medicare Other | Source: Ambulatory Visit | Attending: Vascular Surgery | Admitting: Vascular Surgery

## 2014-07-13 VITALS — BP 186/79 | HR 78 | Ht 62.0 in | Wt 132.0 lb

## 2014-07-13 DIAGNOSIS — I2510 Atherosclerotic heart disease of native coronary artery without angina pectoris: Secondary | ICD-10-CM | POA: Diagnosis not present

## 2014-07-13 DIAGNOSIS — I739 Peripheral vascular disease, unspecified: Secondary | ICD-10-CM

## 2014-07-13 DIAGNOSIS — Z48812 Encounter for surgical aftercare following surgery on the circulatory system: Secondary | ICD-10-CM

## 2014-07-13 DIAGNOSIS — I6523 Occlusion and stenosis of bilateral carotid arteries: Secondary | ICD-10-CM | POA: Diagnosis not present

## 2014-07-13 NOTE — Addendum Note (Signed)
Addended by: Mena Goes on: 07/13/2014 12:17 PM   Modules accepted: Orders

## 2014-07-13 NOTE — Progress Notes (Signed)
The patient is an 80 year old female who previously underwent right femoral popliteal bypass graft in August of 2013. She also had amputation of the tip of her right first toe at that time. She is also previously had a left superficial femoral artery stent but has known chronic occlusion of the distal superficial femoral artery and popliteal artery by arteriogram. She returns today for routine followup. She denies any claudication symptoms. She has no open wounds. She denies rest pain. She has some chronic right foot swelling. She also has a chronic seroma in her right mid thigh. She does not walk much but is able to complete household chores.  She is able to walk with the support of a cane. She has had some cough and congestion recently.  Review of systems: She denies shortness of breath. She denies chest pain.    Current Outpatient Prescriptions on File Prior to Visit   Medication  Sig  Dispense  Refill   .  acetaminophen (TYLENOL) 500 MG tablet  Take 1,000 mg by mouth every 6 (six) hours as needed.         Marland Kitchen  atorvastatin (LIPITOR) 80 MG tablet  Take 80 mg by mouth daily.         .  chlorthalidone (HYGROTON) 25 MG tablet  Take 1 tablet (25 mg total) by mouth daily.   30 tablet   6   .  glipiZIDE (GLUCOTROL) 5 MG tablet  Take 5 mg by mouth 2 (two) times daily before a meal.         .  IRON PO  Take by mouth 2 (two) times daily.         Marland Kitchen  labetalol (NORMODYNE) 300 MG tablet  Take 300 mg by mouth 2 (two) times daily.         Marland Kitchen  lisinopril (PRINIVIL,ZESTRIL) 10 MG tablet  Take 1 tablet (10 mg total) by mouth daily.   30 tablet   6   .  metFORMIN (GLUCOPHAGE) 500 MG tablet  Take 500 mg by mouth 3 (three) times daily.         .  nitroGLYCERIN (NITROSTAT) 0.4 MG SL tablet  Place 0.4 mg under the tongue every 5 (five) minutes as needed for chest pain.         .  traMADol (ULTRAM) 50 MG tablet  Take 50 mg by mouth 2 (two) times daily.             No current facility-administered medications on file prior  to visit.      Physical exam:     Filed Vitals:   07/13/14 1113 07/13/14 1117  BP: 187/86 186/79  Pulse: 78   Height: 5\' 2"  (1.575 m)   Weight: 132 lb (59.875 kg)   SpO2: 95%     Extremities: 2+ femoral pulses bilaterally, absent pedal pulses bilaterally (of note 1 vessel peroneal runoff to the right foot), well-healed right first toe amputation site, 3 x 3 nontender mass right medial thigh consistent with seroma.  Skin: No open ulcers or wounds  Chest: Clear to auscultation right side, left basilar crackles  Neck: No carotid bruits  Data: Duplex ultrasound of the lower extremities as well as ABIs was performed today. I reviewed and interpreted this study. Duplex ultrasound shows a widely patent right femoral popliteal bypass and chronic occlusion of the left distal superficial femoral popliteal artery.  4 x 3 cm right mid thigh seroma vessels were calcified and noncompressible. 40-60% right internal  carotid artery stenosis less than 40% left internal carotid artery stenosis unchanged since 2012  Assessment: Bilateral peripheral arterial disease a patent right femoropopliteal bypass graft. Chronic occlusion left superficial and popliteal artery asymptomatic.  Crackles left base. The patient will make an appointment with her primary care physician if her cough and congestion does not clear up within the next 7-10 days.  Plan: Followup duplex scan 12 months with repeat ABIs repeat carotid duplex at that time  Ruta Hinds, MD [facility] Office: [phone] Pager: [place] UP DATA:  Current smoker: [  ] yes  [x  ] no  Living status: [ x ]  Home  [  ] Nursing home  [  ] Homeless     MEDS:  ASA [ x] yes  [  ] no- [  ] medical reason  [  ] non compliant  STATIN  [ x ] yes  [  ] no- [  ] medical reason  [  ] non compliant  Beta blocker [ x ] yes  [  ] no- [  ] medical reason  [  ] non compliant  ACE  inhibitor [ x ] yes  [  ] no- [  ] medical reason  [  ] non compliant  P2Y12 Antagonist [  ] none  [  ] clopidogrel-Plavix  [  ] ticlopidine-Ticlid   [  ] prasugrel-Effient  [  ] ticagrelor- Brilinta    Anticoagulant [ x ] None  [  ] warfarin  [  ] rivaroxaban-Xarelto [  ] dabigatran- Pradaxa  Ambulation: [ x ] Amb  [  ] Amb with assistance  [  ] wheelchair  [  ] bedridden  Ipsilateral Sx: [ x ] none   [  ] claudication  [  ] rest pain  [  ] tissue loss  Current Patency: [ x ] primary  [  ] primary-assisted  [  ] secondary  [  ] occluded  Patency judged by: [  ] doppler  [  ] palp graft pulse  [  ] palp distal pulse   [  ] ABI increase > 15%   [ x ] Duplex  If occluded, when-   Ipsilateral ABI: non compressible                             Ipsilateral TBI: non compressible  Infection: [ x ] none  [  ] cellulitis  [  ] deep abscess  [  ] infection of artery or graft  Bypass revision: [x  ] no  [  ] yes- [  ] surg  [  ] catheter based  [  ] both    Date:   Thrombectomy/ lysis/ revision:  [ x ] no  [  ] yes- [  ] surg  [  ] catheter based  [  ] both    Date:   Major amputation: [  ] no  [  x] minor amp at time of initial bypass [  ] BKA  [  ] AKA   Date:

## 2014-07-25 DIAGNOSIS — E1140 Type 2 diabetes mellitus with diabetic neuropathy, unspecified: Secondary | ICD-10-CM | POA: Diagnosis not present

## 2014-07-25 DIAGNOSIS — E1151 Type 2 diabetes mellitus with diabetic peripheral angiopathy without gangrene: Secondary | ICD-10-CM | POA: Diagnosis not present

## 2014-08-14 DIAGNOSIS — H3531 Nonexudative age-related macular degeneration: Secondary | ICD-10-CM | POA: Diagnosis not present

## 2014-08-14 DIAGNOSIS — E11339 Type 2 diabetes mellitus with moderate nonproliferative diabetic retinopathy without macular edema: Secondary | ICD-10-CM | POA: Diagnosis not present

## 2014-09-05 DIAGNOSIS — E119 Type 2 diabetes mellitus without complications: Secondary | ICD-10-CM | POA: Diagnosis not present

## 2014-09-05 DIAGNOSIS — I10 Essential (primary) hypertension: Secondary | ICD-10-CM | POA: Diagnosis not present

## 2014-09-07 ENCOUNTER — Encounter: Payer: Self-pay | Admitting: *Deleted

## 2014-09-08 ENCOUNTER — Encounter: Payer: Self-pay | Admitting: Cardiology

## 2014-09-08 ENCOUNTER — Ambulatory Visit (INDEPENDENT_AMBULATORY_CARE_PROVIDER_SITE_OTHER): Payer: Medicare Other | Admitting: Cardiology

## 2014-09-08 VITALS — BP 176/78 | HR 70 | Ht 62.0 in | Wt 136.0 lb

## 2014-09-08 DIAGNOSIS — I2510 Atherosclerotic heart disease of native coronary artery without angina pectoris: Secondary | ICD-10-CM | POA: Diagnosis not present

## 2014-09-08 DIAGNOSIS — I10 Essential (primary) hypertension: Secondary | ICD-10-CM | POA: Diagnosis not present

## 2014-09-08 DIAGNOSIS — E785 Hyperlipidemia, unspecified: Secondary | ICD-10-CM

## 2014-09-08 DIAGNOSIS — R600 Localized edema: Secondary | ICD-10-CM | POA: Diagnosis not present

## 2014-09-08 MED ORDER — AMLODIPINE BESYLATE 10 MG PO TABS: 10.0000 mg | ORAL_TABLET | Freq: Every day | ORAL | Status: DC

## 2014-09-08 NOTE — Patient Instructions (Signed)
   Increase Amlodipine to 10mg  daily - may take 2 of your 5mg  tabs daily till finish current supply.  New prescription sent to pharmacy today on above. Continue all other medications.   Your physician has requested that you regularly monitor and record your blood pressure readings at home. Please take your readings daily, approximately 2 hours after medication.  Return readings to office in 2 weeks for MD review.  Your physician wants you to follow up in: 6 months.  You will receive a reminder letter in the mail one-two months in advance.  If you don't receive a letter, please call our office to schedule the follow up appointment

## 2014-09-08 NOTE — Progress Notes (Signed)
Clinical Summary [name] is a 81 y.o.female seen today for follow up of the following medical problems.   1. CAD  -  MPI 12/2011 as part of preoperative vascular surgery evaluation, found to have inferior wall ischemia with normal LVEF  - referred for cath, received a BMS to her RCA   - denies any chest pain recently. Denies any SOB or DOE - compliant with meds  - she is not on ASA because of prior anemia and GI bleed   2. HTN  - checks regularly. Typically 140-180s/60-80s - compliant with meds   3. Hyperlipidemia  - compliant with atorvastatin  -08/2013 TC 154 TG 108 HDL 65 LDL 67   4. PAD  - followed by vascular, prior stenting, prior fem-pop bypass Aug 2013 - denies any leg pains  5. LE edema - likely due to diastolic dysfunction - denies any recent swelling - on diuretics previously had significant electrolyte abnormalities including HypoNa, hypoK, and hypoMg.   Past Medical History  Diagnosis Date  . Hyperlipidemia   . Hypertension   . Carotid artery occlusion   . PVD (peripheral vascular disease)     Stents left SFA 2004 Dr. Albertine Ray  . Anemia   . GERD (gastroesophageal reflux disease)   . Type II diabetes mellitus   . History of blood transfusion 11/11/11  . Arthritis     "dr said I have it in my back" Lower   . COPD (chronic obstructive pulmonary disease)      Allergies  Allergen Reactions  . Aspirin Other (See Comments)    Dr. Britta Ray informed patient not to take this due to ulcer  . Ibuprofen Other (See Comments)    Dr. Britta Ray informed patient not to take this due to ulcer      Current Outpatient Prescriptions  Medication Sig Dispense Refill  . amLODipine (NORVASC) 5 MG tablet Take 1 tablet (5 mg total) by mouth daily. 30 tablet 6  . atorvastatin (LIPITOR) 80 MG tablet Take 80 mg by mouth daily.    . calcium-vitamin D (OSCAL WITH D) 500-200 MG-UNIT per tablet Take 1 tablet by mouth 2 (two) times daily.    . ferrous sulfate 325 (65 FE) MG  tablet Take 325 mg by mouth 2 (two) times daily.    Marland Kitchen glipiZIDE (GLUCOTROL) 5 MG tablet Take 5 mg by mouth 2 (two) times daily before a meal.    . ibuprofen (ADVIL,MOTRIN) 200 MG tablet Take 400 mg by mouth every morning.    Marland Kitchen lisinopril (PRINIVIL,ZESTRIL) 40 MG tablet Take 1 tablet (40 mg total) by mouth daily. 30 tablet 6  . magnesium oxide (MAG-OX) 400 MG tablet Take 400 mg by mouth daily.    . metFORMIN (GLUCOPHAGE) 500 MG tablet Take 500 mg by mouth 3 (three) times daily.    . metoprolol tartrate (LOPRESSOR) 25 MG tablet Take 25 mg by mouth 2 (two) times daily.    . nitroGLYCERIN (NITROSTAT) 0.4 MG SL tablet Place 0.4 mg under the tongue every 5 (five) minutes as needed for chest pain.    Marland Kitchen omeprazole (PRILOSEC) 40 MG capsule Take 40 mg by mouth as needed.    . traMADol (ULTRAM) 50 MG tablet Take 50 mg by mouth 2 (two) times daily.      No current facility-administered medications for this visit.     Past Surgical History  Procedure Laterality Date  . Eye surgery  2002    Cataract  . Eye surgery  2009    Laser bilateral   . Coronary angioplasty with stent placement  01/19/12    "1; first one I've ever had"  . Tonsillectomy and adenoidectomy  1946  . Posterior laminectomy / decompression lumbar spine  1989  . Peripheral arterial stent graft  2003    LLE  . Cataract extraction w/ intraocular lens  implant, bilateral  2002  . Refractive surgery  2009    bilaterally  . Colonoscopy    . Femoral-popliteal bypass graft  03/23/2012    Procedure: BYPASS GRAFT FEMORAL-POPLITEAL ARTERY;  Surgeon: Cynthia Dutch, MD;  Location: [facility] OR;  Service: Vascular;  Laterality: Right;  Right Femoral endarterectomy with profundaplasty; right femoral-popliteal bypass with nonreversed saphenous vein; and right first toe amputation  . Amputation  03/23/2012    Procedure: AMPUTATION DIGIT;  Surgeon: Cynthia Dutch, MD;  Location: [facility] OR;  Service: Vascular;  Laterality: Right;  Right Femoral  endarterectomy with profundaplasty; right femoral-popliteal bypass with nonreversed saphenous vein; and right first toe amputation  . Femoral endarterectomy  03/23/2012    right  . Abdominal aortagram N/A 10/31/2011    Procedure: ABDOMINAL Cynthia Ray;  Surgeon: Cynthia Dutch, MD;  Location[facility] C[facility] LAB;  Service: Cardiovascular;  Laterality: N/A;  . Percutaneous coronary stent intervention (pci-s) N/A 01/19/2012    Procedure: PERCUTANEOUS CORONARY STENT INTERVENTION (PCI-S);  Surgeon: Cynthia M Martinique, MD;  Location: [facility] CATH LAB;  Service: Cardiovascular;  Laterality: N/A;     Allergies  Allergen Reactions  . Aspirin Other (See Comments)    Dr. Britta Ray informed patient not to take this due to ulcer  . Ibuprofen Other (See Comments)    Dr. Britta Ray informed patient not to take this due to ulcer       Family History  Problem Relation Age of Onset  . Stroke Father 44  . Hypertension Father   . Stroke Mother 41  . Diabetes Mother   . Hypertension Mother   . Diabetes Sister   . Hyperlipidemia Sister   . Hypertension Sister   . Diabetes Brother   . Hypertension Brother      Social History Cynthia Ray reports that she quit smoking about 27 years ago. Her smoking use included Cigarettes. She has a 24 pack-year smoking history. She has never used smokeless tobacco. Cynthia Ray reports that she does not drink alcohol.   Review of Systems CONSTITUTIONAL: No weight loss, fever, chills, weakness or fatigue.  HEENT: Eyes: No visual loss, blurred vision, double vision or yellow sclerae.No hearing loss, sneezing, congestion, runny nose or sore throat.  SKIN: No rash or itching.  CARDIOVASCULAR: per HPI RESPIRATORY: No shortness of breath, cough or sputum.  GASTROINTESTINAL: No anorexia, nausea, vomiting or diarrhea. No abdominal pain or blood.  GENITOURINARY: No burning on urination, no polyuria NEUROLOGICAL: No headache, dizziness, syncope, paralysis, ataxia, numbness or tingling in the  extremities. No change in bowel or bladder control.  MUSCULOSKELETAL: No muscle, back pain, joint pain or stiffness.  LYMPHATICS: No enlarged nodes. No history of splenectomy.  PSYCHIATRIC: No history of depression or anxiety.  ENDOCRINOLOGIC: No reports of sweating, cold or heat intolerance. No polyuria or polydipsia.  Marland Kitchen   Physical Examination p 70 bp 176/78 Wt 136 lbs BMI 25 Gen: resting comfortably, no acute distress HEENT: no scleral icterus, pupils equal round and reactive, no palptable cervical adenopathy,  CV RRR, no m/r/g, no JVD, no carotid bruits Resp: Clear to auscultation bilaterally GI: abdomen is soft, non-tender, non-distended, normal bowel  sounds, no hepatosplenomegaly MSK: extremities are warm, no edema.  Skin: warm, no rash Neuro:  no focal deficits Psych: appropriate affect   Diagnostic Studies 12/2011 Stress MPI: inferior wall ischemia, normal LVEF   12/2011 Cath  Procedural Findings:  Hemodynamics:  AO 179/62  LV 192/12  Coronary angiography:  Coronary dominance: Right  Left mainstem: Moderately calcified. Distal 60%.  Left anterior descending (LAD): Probable ostial 60% (difficult to fully lay out the ostial circ/LAD/RI).  Ramus Intermedius: Moderate sized. It appears to be free of high grade disease although it is difficult to visualize the ostium.  Left circumflex (LCx): AV groove ostial 40%. OM1 moderate with ostial 50%. OM2 moderate and branching without high grade disease  Right coronary artery (RCA): Large. Heavy calcification. Long proximal 30%. Focal mid 99% followed by 95%. PDA small to moderate and normal. PL x 2 small to moderate and normal.  Left ventriculography: Left ventricular systolic function is normal, LVEF is estimated at 65%, there is no significant mitral regurgitation  Final Conclusions: Heavy valve calcification. Severe single vessel CAD with moderate disease elsewhere  Recommendations: I will review with colleagues for  consideration of PCI of the RCA before possible lower extremity surgery.   Lesion Data:  Vessel: Mid RCA  Percent stenosis (pre): 90%  TIMI-flow (pre): 3  Stent: 2.5 x 23 mm MultiLink mini vision stent  Percent stenosis (post): 0%  TIMI-flow (post): 3  Conclusions:  1. Successful intracoronary stenting of the mid right coronary with a bare-metal stent. Unable to cross the lesion at the crux.   06/15/13 Clinic EKG: sinus rhythm, normal axis, no ischemic changes   06/2013 Echo  LVEF 65-60%, mild LVH, grade I diastolic dysfunction, aortic sclerosis without stenosis    Assessment and Plan  1. CAD  - no current symptoms  - continue current medications and risk factor modfication   2. Hyperlipidemia  - lipids at goal, continue current meds   3. HTN  - above goal, given her history of DM her goal blood pressure is < 130/80  - will increase norvasc to 10 mg daily, she will drop off bp log in 2 weeks  4. PAD  - she will continue to follow with vascular   5. LE edema  - improved, has not required diuretic - daily lasix caused significant electrolyte abnormalities, if needed would only prescribe on prn basis   F/u 6 months    Cynthia Ray, M.D.

## 2014-10-03 DIAGNOSIS — E1151 Type 2 diabetes mellitus with diabetic peripheral angiopathy without gangrene: Secondary | ICD-10-CM | POA: Diagnosis not present

## 2014-10-03 DIAGNOSIS — E1140 Type 2 diabetes mellitus with diabetic neuropathy, unspecified: Secondary | ICD-10-CM | POA: Diagnosis not present

## 2014-10-17 ENCOUNTER — Telehealth: Payer: Self-pay | Admitting: *Deleted

## 2014-10-17 NOTE — Telephone Encounter (Signed)
-----   Message from Arnoldo Lenis, MD sent at 10/17/2014 12:01 PM EDT ----- BP log reviewed, on average bp are ok. Continue current meds  Zandra Abts MD

## 2014-10-17 NOTE — Telephone Encounter (Signed)
Pt made aware

## 2014-12-05 DIAGNOSIS — E119 Type 2 diabetes mellitus without complications: Secondary | ICD-10-CM | POA: Diagnosis not present

## 2014-12-05 DIAGNOSIS — I10 Essential (primary) hypertension: Secondary | ICD-10-CM | POA: Diagnosis not present

## 2014-12-12 DIAGNOSIS — E1140 Type 2 diabetes mellitus with diabetic neuropathy, unspecified: Secondary | ICD-10-CM | POA: Diagnosis not present

## 2014-12-12 DIAGNOSIS — E1151 Type 2 diabetes mellitus with diabetic peripheral angiopathy without gangrene: Secondary | ICD-10-CM | POA: Diagnosis not present

## 2014-12-13 DIAGNOSIS — H524 Presbyopia: Secondary | ICD-10-CM | POA: Diagnosis not present

## 2014-12-13 DIAGNOSIS — E11311 Type 2 diabetes mellitus with unspecified diabetic retinopathy with macular edema: Secondary | ICD-10-CM | POA: Diagnosis not present

## 2015-01-04 DIAGNOSIS — Z1231 Encounter for screening mammogram for malignant neoplasm of breast: Secondary | ICD-10-CM | POA: Diagnosis not present

## 2015-02-12 DIAGNOSIS — E11339 Type 2 diabetes mellitus with moderate nonproliferative diabetic retinopathy without macular edema: Secondary | ICD-10-CM | POA: Diagnosis not present

## 2015-02-12 DIAGNOSIS — E11359 Type 2 diabetes mellitus with proliferative diabetic retinopathy without macular edema: Secondary | ICD-10-CM | POA: Diagnosis not present

## 2015-02-20 DIAGNOSIS — E1140 Type 2 diabetes mellitus with diabetic neuropathy, unspecified: Secondary | ICD-10-CM | POA: Diagnosis not present

## 2015-02-20 DIAGNOSIS — E1151 Type 2 diabetes mellitus with diabetic peripheral angiopathy without gangrene: Secondary | ICD-10-CM | POA: Diagnosis not present

## 2015-03-07 ENCOUNTER — Encounter: Payer: Self-pay | Admitting: Cardiology

## 2015-03-07 ENCOUNTER — Encounter: Payer: Self-pay | Admitting: *Deleted

## 2015-03-07 ENCOUNTER — Ambulatory Visit (INDEPENDENT_AMBULATORY_CARE_PROVIDER_SITE_OTHER): Payer: Medicare Other | Admitting: Cardiology

## 2015-03-07 VITALS — BP 146/72 | HR 72 | Ht 62.0 in | Wt 129.0 lb

## 2015-03-07 DIAGNOSIS — R600 Localized edema: Secondary | ICD-10-CM

## 2015-03-07 DIAGNOSIS — I2510 Atherosclerotic heart disease of native coronary artery without angina pectoris: Secondary | ICD-10-CM

## 2015-03-07 DIAGNOSIS — I739 Peripheral vascular disease, unspecified: Secondary | ICD-10-CM

## 2015-03-07 DIAGNOSIS — E785 Hyperlipidemia, unspecified: Secondary | ICD-10-CM

## 2015-03-07 MED ORDER — METOPROLOL TARTRATE 25 MG PO TABS: 25.0000 mg | ORAL_TABLET | Freq: Two times a day (BID) | ORAL | Status: DC

## 2015-03-07 MED ORDER — AMLODIPINE BESYLATE 10 MG PO TABS: 10.0000 mg | ORAL_TABLET | Freq: Every day | ORAL | Status: DC

## 2015-03-07 MED ORDER — CLOPIDOGREL BISULFATE 75 MG PO TABS: 75.0000 mg | ORAL_TABLET | Freq: Every day | ORAL | Status: DC

## 2015-03-07 MED ORDER — LISINOPRIL 40 MG PO TABS: 40.0000 mg | ORAL_TABLET | Freq: Every day | ORAL | Status: DC

## 2015-03-07 NOTE — Progress Notes (Signed)
Patient ID: Cynthia Ray, female   DOB: 13-Sep-1925, 81 y.o.   MRN: [id number]     Clinical Summary Ms. Bruntz is a 81 y.o.female seen today for follow up of the following medical problems.   1. CAD  - MPI 12/2011 as part of preoperative vascular surgery evaluation, found to have inferior wall ischemia with normal LVEF  - referred for cath, received a BMS to her RCA  - she is not on ASA because of prior anemia and GI bleed   - denies any chest pain. Denies any SOB or DOE. - compliant with meds   2. HTN  - compliant with meds   3. Hyperlipidemia  - compliant with atorvastatin   4. PAD  - followed by vascular, prior stenting, prior fem-pop bypass Aug 2013 - denies any leg pains  5. LE edema - denies any recent swelling - on diuretics previously had significant electrolyte abnormalities including HypoNa, hypoK, and hypoMg.  Past Medical History  Diagnosis Date  . Hyperlipidemia   . Hypertension   . Carotid artery occlusion   . PVD (peripheral vascular disease)     Stents left SFA 2004 Dr. Albertine Patricia  . Anemia   . GERD (gastroesophageal reflux disease)   . Type II diabetes mellitus   . History of blood transfusion 11/11/11  . Arthritis     "dr said I have it in my back" Lower   . COPD (chronic obstructive pulmonary disease)      Allergies  Allergen Reactions  . Aspirin Other (See Comments)    Dr. Britta Mccreedy informed patient not to take this due to ulcer  . Ibuprofen Other (See Comments)    Dr. Britta Mccreedy informed patient not to take this due to ulcer      Current Outpatient Prescriptions  Medication Sig Dispense Refill  . amLODipine (NORVASC) 10 MG tablet Take 1 tablet (10 mg total) by mouth daily. 90 tablet 3  . atorvastatin (LIPITOR) 80 MG tablet Take 80 mg by mouth daily.    . calcium-vitamin D (OSCAL WITH D) 500-200 MG-UNIT per tablet Take 1 tablet by mouth 2 (two) times daily.    . cholecalciferol (VITAMIN D) 1000 UNITS tablet Take 1,000 Units by mouth daily.     . ferrous sulfate 325 (65 FE) MG tablet Take 325 mg by mouth 2 (two) times daily.    Marland Kitchen glipiZIDE (GLUCOTROL) 5 MG tablet Take 5 mg by mouth 2 (two) times daily before a meal.    . ibuprofen (ADVIL,MOTRIN) 200 MG tablet Take 400 mg by mouth every morning.    Marland Kitchen lisinopril (PRINIVIL,ZESTRIL) 40 MG tablet Take 1 tablet (40 mg total) by mouth daily. 30 tablet 6  . magnesium oxide (MAG-OX) 400 MG tablet Take 400 mg by mouth daily.    . metFORMIN (GLUCOPHAGE) 500 MG tablet Take 500 mg by mouth 3 (three) times daily.    . metoprolol tartrate (LOPRESSOR) 25 MG tablet Take 25 mg by mouth 2 (two) times daily.    . nitroGLYCERIN (NITROSTAT) 0.4 MG SL tablet Place 0.4 mg under the tongue every 5 (five) minutes as needed for chest pain.    . Omega-3 Fatty Acids (FISH OIL) 1000 MG CAPS Take 1 capsule by mouth daily.    Marland Kitchen omeprazole (PRILOSEC) 40 MG capsule Take 40 mg by mouth as needed.    . traMADol (ULTRAM) 50 MG tablet Take 50 mg by mouth 2 (two) times daily.      No current facility-administered medications for this  visit.     Past Surgical History  Procedure Laterality Date  . Eye surgery  2002    Cataract  . Eye surgery  2009    Laser bilateral   . Coronary angioplasty with stent placement  01/19/12    "1; first one I've ever had"  . Tonsillectomy and adenoidectomy  1946  . Posterior laminectomy / decompression lumbar spine  1989  . Peripheral arterial stent graft  2003    LLE  . Cataract extraction w/ intraocular lens  implant, bilateral  2002  . Refractive surgery  2009    bilaterally  . Colonoscopy    . Femoral-popliteal bypass graft  03/23/2012    Procedure: BYPASS GRAFT FEMORAL-POPLITEAL ARTERY;  Surgeon: Elam Dutch, MD;  Location: [facility] OR;  Service: Vascular;  Laterality: Right;  Right Femoral endarterectomy with profundaplasty; right femoral-popliteal bypass with nonreversed saphenous vein; and right first toe amputation  . Amputation  03/23/2012    Procedure: AMPUTATION DIGIT;   Surgeon: Elam Dutch, MD;  Location: [facility] OR;  Service: Vascular;  Laterality: Right;  Right Femoral endarterectomy with profundaplasty; right femoral-popliteal bypass with nonreversed saphenous vein; and right first toe amputation  . Femoral endarterectomy  03/23/2012    right  . Abdominal aortagram N/A 10/31/2011    Procedure: ABDOMINAL Maxcine Ham;  Surgeon: Elam Dutch, MD;  Location: [facility] CATH LAB;  Service: Cardiovascular;  Laterality: N/A;  . Percutaneous coronary stent intervention (pci-s) N/A 01/19/2012    Procedure: PERCUTANEOUS CORONARY STENT INTERVENTION (PCI-S);  Surgeon: Peter M Martinique, MD;  Location: [facility] CATH LAB;  Service: Cardiovascular;  Laterality: N/A;     Allergies  Allergen Reactions  . Aspirin Other (See Comments)    Dr. Britta Mccreedy informed patient not to take this due to ulcer  . Ibuprofen Other (See Comments)    Dr. Britta Mccreedy informed patient not to take this due to ulcer       Family History  Problem Relation Age of Onset  . Stroke Father 22  . Hypertension Father   . Stroke Mother 37  . Diabetes Mother   . Hypertension Mother   . Diabetes Sister   . Hyperlipidemia Sister   . Hypertension Sister   . Diabetes Brother   . Hypertension Brother      Social History Ms. Rafanan reports that she quit smoking about 27 years ago. Her smoking use included Cigarettes. She started smoking about 51 years ago. She has a 24 pack-year smoking history. She has never used smokeless tobacco. Ms. Meadow reports that she does not drink alcohol.   Review of Systems CONSTITUTIONAL: No weight loss, fever, chills, weakness or fatigue.  HEENT: Eyes: No visual loss, blurred vision, double vision or yellow sclerae.No hearing loss, sneezing, congestion, runny nose or sore throat.  SKIN: No rash or itching.  CARDIOVASCULAR: per HPI RESPIRATORY: No shortness of breath, cough or sputum.  GASTROINTESTINAL: No anorexia, nausea, vomiting or diarrhea. No abdominal pain or blood.    GENITOURINARY: No burning on urination, no polyuria NEUROLOGICAL: No headache, dizziness, syncope, paralysis, ataxia, numbness or tingling in the extremities. No change in bowel or bladder control.  MUSCULOSKELETAL: No muscle, back pain, joint pain or stiffness.  LYMPHATICS: No enlarged nodes. No history of splenectomy.  PSYCHIATRIC: No history of depression or anxiety.  ENDOCRINOLOGIC: No reports of sweating, cold or heat intolerance. No polyuria or polydipsia.  Marland Kitchen   Physical Examination Filed Vitals:   03/07/15 1257  BP: 146/72  Pulse: 72   Filed Vitals:  03/07/15 1257  Height: 5\' 2"  (1.575 m)  Weight: 129 lb (58.514 kg)    Gen: resting comfortably, no acute distress HEENT: no scleral icterus, pupils equal round and reactive, no palptable cervical adenopathy,  CV: RRR, no m/r/g, no JVD Resp: Clear to auscultation bilaterally GI: abdomen is soft, non-tender, non-distended, normal bowel sounds, no hepatosplenomegaly MSK: extremities are warm, no edema.  Skin: warm, no rash Neuro:  no focal deficits Psych: appropriate affect   Diagnostic Studies 12/2011 Stress MPI: inferior wall ischemia, normal LVEF   12/2011 Cath  Procedural Findings:  Hemodynamics:  AO 179/62  LV 192/12  Coronary angiography:  Coronary dominance: Right  Left mainstem: Moderately calcified. Distal 60%.  Left anterior descending (LAD): Probable ostial 60% (difficult to fully lay out the ostial circ/LAD/RI).  Ramus Intermedius: Moderate sized. It appears to be free of high grade disease although it is difficult to visualize the ostium.  Left circumflex (LCx): AV groove ostial 40%. OM1 moderate with ostial 50%. OM2 moderate and branching without high grade disease  Right coronary artery (RCA): Large. Heavy calcification. Long proximal 30%. Focal mid 99% followed by 95%. PDA small to moderate and normal. PL x 2 small to moderate and normal.  Left ventriculography: Left ventricular systolic  function is normal, LVEF is estimated at 65%, there is no significant mitral regurgitation  Final Conclusions: Heavy valve calcification. Severe single vessel CAD with moderate disease elsewhere  Recommendations: I will review with colleagues for consideration of PCI of the RCA before possible lower extremity surgery.   Lesion Data:  Vessel: Mid RCA  Percent stenosis (pre): 90%  TIMI-flow (pre): 3  Stent: 2.5 x 23 mm MultiLink mini vision stent  Percent stenosis (post): 0%  TIMI-flow (post): 3  Conclusions:  1. Successful intracoronary stenting of the mid right coronary with a bare-metal stent. Unable to cross the lesion at the crux.   06/15/13 Clinic EKG: sinus rhythm, normal axis, no ischemic changes   06/2013 Echo  LVEF 65-60%, mild LVH, grade I diastolic dysfunction, aortic sclerosis without stenosis    Assessment and Plan  1. CAD  - no current symptoms  - continue current medications and risk factor modfication  - bleeding on ASA previously. Will start plavix 75mg  daily for secondary prevention  2. Hyperlipidemia  - lipids at goal, continue current meds   3. HTN  - at goal, continue current meds  4. PAD  - she will continue to follow with vascular   5. LE edema  - improved, has not required diuretic - daily lasix caused significant electrolyte abnormalities, if needed would only prescribe low dose on prn basis   F/u 1 year. Request labs from pcp   Arnoldo Lenis, M.D.

## 2015-03-07 NOTE — Patient Instructions (Signed)
Your physician wants you to follow-up in: [place] DR. BRANCH You will receive a reminder letter in the mail two months in advance. If you don't receive a letter, please call our office to schedule the follow-up appointment.  Your physician has recommended you make the following change in your medication:   START PLAVIX 75 MG DAILY  WE HAVE REFILLED YOUR CARDIAC MEDICATIONS  WE WILL REQUEST LAB RESULTS   Thank you for choosing [facility]!!

## 2015-03-08 DIAGNOSIS — I739 Peripheral vascular disease, unspecified: Secondary | ICD-10-CM | POA: Diagnosis not present

## 2015-03-08 DIAGNOSIS — I10 Essential (primary) hypertension: Secondary | ICD-10-CM | POA: Diagnosis not present

## 2015-03-08 DIAGNOSIS — E1159 Type 2 diabetes mellitus with other circulatory complications: Secondary | ICD-10-CM | POA: Diagnosis not present

## 2015-05-08 DIAGNOSIS — E1151 Type 2 diabetes mellitus with diabetic peripheral angiopathy without gangrene: Secondary | ICD-10-CM | POA: Diagnosis not present

## 2015-05-08 DIAGNOSIS — E1140 Type 2 diabetes mellitus with diabetic neuropathy, unspecified: Secondary | ICD-10-CM | POA: Diagnosis not present

## 2015-06-06 DIAGNOSIS — Z0000 Encounter for general adult medical examination without abnormal findings: Secondary | ICD-10-CM | POA: Diagnosis not present

## 2015-06-06 DIAGNOSIS — I10 Essential (primary) hypertension: Secondary | ICD-10-CM | POA: Diagnosis not present

## 2015-06-06 DIAGNOSIS — E1159 Type 2 diabetes mellitus with other circulatory complications: Secondary | ICD-10-CM | POA: Diagnosis not present

## 2015-06-06 DIAGNOSIS — Z1389 Encounter for screening for other disorder: Secondary | ICD-10-CM | POA: Diagnosis not present

## 2015-06-25 DIAGNOSIS — M810 Age-related osteoporosis without current pathological fracture: Secondary | ICD-10-CM | POA: Diagnosis not present

## 2015-06-25 DIAGNOSIS — E119 Type 2 diabetes mellitus without complications: Secondary | ICD-10-CM | POA: Diagnosis not present

## 2015-06-25 DIAGNOSIS — I10 Essential (primary) hypertension: Secondary | ICD-10-CM | POA: Diagnosis not present

## 2015-06-25 DIAGNOSIS — Z780 Asymptomatic menopausal state: Secondary | ICD-10-CM | POA: Diagnosis not present

## 2015-06-25 DIAGNOSIS — Z87891 Personal history of nicotine dependence: Secondary | ICD-10-CM | POA: Diagnosis not present

## 2015-06-25 DIAGNOSIS — Z79899 Other long term (current) drug therapy: Secondary | ICD-10-CM | POA: Diagnosis not present

## 2015-06-25 DIAGNOSIS — M85852 Other specified disorders of bone density and structure, left thigh: Secondary | ICD-10-CM | POA: Diagnosis not present

## 2015-06-25 DIAGNOSIS — Z7984 Long term (current) use of oral hypoglycemic drugs: Secondary | ICD-10-CM | POA: Diagnosis not present

## 2015-06-25 DIAGNOSIS — E28319 Asymptomatic premature menopause: Secondary | ICD-10-CM | POA: Diagnosis not present

## 2015-07-13 ENCOUNTER — Encounter: Payer: Self-pay | Admitting: Vascular Surgery

## 2015-07-17 DIAGNOSIS — E1151 Type 2 diabetes mellitus with diabetic peripheral angiopathy without gangrene: Secondary | ICD-10-CM | POA: Diagnosis not present

## 2015-07-17 DIAGNOSIS — E1140 Type 2 diabetes mellitus with diabetic neuropathy, unspecified: Secondary | ICD-10-CM | POA: Diagnosis not present

## 2015-07-19 ENCOUNTER — Encounter: Payer: Self-pay | Admitting: Vascular Surgery

## 2015-07-19 ENCOUNTER — Ambulatory Visit (INDEPENDENT_AMBULATORY_CARE_PROVIDER_SITE_OTHER)
Admission: RE | Admit: 2015-07-19 | Discharge: 2015-07-19 | Disposition: A | Discharge status: Home / Self Care | Payer: Medicare Other | Source: Ambulatory Visit | Attending: Vascular Surgery | Admitting: Vascular Surgery

## 2015-07-19 ENCOUNTER — Other Ambulatory Visit: Payer: Self-pay | Admitting: Vascular Surgery

## 2015-07-19 ENCOUNTER — Ambulatory Visit (INDEPENDENT_AMBULATORY_CARE_PROVIDER_SITE_OTHER): Payer: Medicare Other | Admitting: Vascular Surgery

## 2015-07-19 ENCOUNTER — Ambulatory Visit (HOSPITAL_COMMUNITY)
Admission: RE | Admit: 2015-07-19 | Discharge: 2015-07-19 | Disposition: A | Discharge status: Home / Self Care | Payer: Medicare Other | Source: Ambulatory Visit | Attending: Vascular Surgery | Admitting: Vascular Surgery

## 2015-07-19 VITALS — BP 164/77 | HR 77 | Temp 97.3°F | Resp 16 | Ht <= 58 in | Wt 129.0 lb

## 2015-07-19 DIAGNOSIS — I739 Peripheral vascular disease, unspecified: Secondary | ICD-10-CM

## 2015-07-19 DIAGNOSIS — I6523 Occlusion and stenosis of bilateral carotid arteries: Secondary | ICD-10-CM | POA: Diagnosis not present

## 2015-07-19 NOTE — Progress Notes (Signed)
Filed Vitals:   07/19/15 1123 07/19/15 1127 07/19/15 1132 07/19/15 1133  BP: 170/79 163/75 152/74 164/77  Pulse: 77 75 77 77  Temp:  97.3 F (36.3 C)    TempSrc:  Oral    Resp:  16    Height:  4\' 10"  (1.473 m)    Weight:  129 lb (58.514 kg)    SpO2:  99%

## 2015-07-19 NOTE — Progress Notes (Signed)
The patient is an 81 year old female who previously underwent right femoral popliteal bypass graft in August of 2013. She also had amputation of the tip of her right first toe at that time. She has also previously had a left superficial femoral artery stent but has known chronic occlusion of the distal superficial femoral artery and popliteal artery by arteriogram. She returns today for routine followup. She denies any claudication symptoms. She has no open wounds. She denies rest pain. She has some chronic right foot and leg swelling. She also has a chronic seroma in her right mid thigh. She does not walk much but is able to complete household chores.  She is able to walk with the support of a cane.   We are also following her for mild to moderate internal carotid artery stenosis. She denies any symptoms of TIA amaurosis or stroke.  Review of systems: She denies shortness of breath. She denies chest pain.    Current Outpatient Prescriptions on File Prior to Visit  Medication Sig Dispense Refill  . amLODipine (NORVASC) 10 MG tablet Take 1 tablet (10 mg total) by mouth daily. 90 tablet 3  . atorvastatin (LIPITOR) 80 MG tablet Take 80 mg by mouth daily.    . calcium-vitamin D (OSCAL WITH D) 500-200 MG-UNIT per tablet Take 1 tablet by mouth 2 (two) times daily.    . cholecalciferol (VITAMIN D) 1000 UNITS tablet Take 1,000 Units by mouth daily.    . clopidogrel (PLAVIX) 75 MG tablet Take 1 tablet (75 mg total) by mouth daily. 90 tablet 3  . ferrous sulfate 325 (65 FE) MG tablet Take 325 mg by mouth 2 (two) times daily.    Marland Kitchen glipiZIDE (GLUCOTROL) 5 MG tablet Take 5 mg by mouth 2 (two) times daily before a meal.    . ibuprofen (ADVIL,MOTRIN) 200 MG tablet Take 400 mg by mouth every morning.    Marland Kitchen lisinopril (PRINIVIL,ZESTRIL) 40 MG tablet Take 1 tablet (40 mg total) by mouth daily. 30 tablet 6  . magnesium oxide (MAG-OX) 400 MG tablet Take 400 mg by mouth daily.    . metFORMIN (GLUCOPHAGE) 500 MG tablet  Take 500 mg by mouth 3 (three) times daily.    . metoprolol tartrate (LOPRESSOR) 25 MG tablet Take 1 tablet (25 mg total) by mouth 2 (two) times daily. 180 tablet 3  . nitroGLYCERIN (NITROSTAT) 0.4 MG SL tablet Place 0.4 mg under the tongue every 5 (five) minutes as needed for chest pain.    . Omega-3 Fatty Acids (FISH OIL) 1000 MG CAPS Take 1 capsule by mouth daily.    Marland Kitchen omeprazole (PRILOSEC) 40 MG capsule Take 40 mg by mouth as needed.    . traMADol (ULTRAM) 50 MG tablet Take 50 mg by mouth 2 (two) times daily.      No current facility-administered medications on file prior to visit.     Physical exam:       Filed Vitals:   07/19/15 1123 07/19/15 1127 07/19/15 1132 07/19/15 1133  BP: 170/79 163/75 152/74 164/77  Pulse: 77 75 77 77  Temp:  97.3 F (36.3 C)    TempSrc:  Oral    Resp:  16    Height:  4\' 10"  (1.473 m)    Weight:  129 lb (58.514 kg)    SpO2:  99%      Extremities: 2+ femoral pulses bilaterally, absent pedal pulses bilaterally (of note 1 vessel peroneal runoff to the right foot), well-healed right first toe amputation site,  3 x 3 nontender mass right medial thigh consistent with seroma. Edema right leg approximately 30% larger than left  Skin: No open ulcers or wounds  Chest: Clear to auscultation   Neck: No carotid bruits  Data: Duplex ultrasound of the lower extremities as well as ABIs was performed today. I reviewed and interpreted this study. Duplex ultrasound shows a widely patent right femoral popliteal bypass and chronic occlusion of the left distal superficial femoral popliteal artery.  4 x 3 cm right mid thigh seroma vessels were calcified and noncompressible. 40-60% right internal carotid artery stenosis less than 40% left internal carotid artery stenosis unchanged since 2012  Assessment: Bilateral peripheral arterial disease a patent right femoropopliteal bypass graft. Chronic occlusion left superficial and popliteal artery asymptomatic.    Plan:  Followup duplex scan 12 months with repeat ABIs repeat carotid duplex at that time  Ruta Hinds, MD [facility] Office: [phone] Pager: [place] UP DATA:  Current smoker: [  ] yes  [x  ] no  Living status: [ x ]  Home  [  ] Nursing home  [  ] Homeless     MEDS:  ASA [ x] yes  [  ] no- [  ] medical reason  [  ] non compliant  STATIN  [ x ] yes  [  ] no- [  ] medical reason  [  ] non compliant  Beta blocker [ x ] yes  [  ] no- [  ] medical reason  [  ] non compliant  ACE inhibitor [ x ] yes  [  ] no- [  ] medical reason  [  ] non compliant  P2Y12 Antagonist [  ] none  [  ] clopidogrel-Plavix  [  ] ticlopidine-Ticlid   [  ] prasugrel-Effient  [  ] ticagrelor- Brilinta    Anticoagulant [ x ] None  [  ] warfarin  [  ] rivaroxaban-Xarelto [  ] dabigatran- Pradaxa  Ambulation: [ x ] Amb  [  ] Amb with assistance  [  ] wheelchair  [  ] bedridden  Ipsilateral Sx: [ x ] none   [  ] claudication  [  ] rest pain  [  ] tissue loss  Current Patency: [ x ] primary  [  ] primary-assisted  [  ] secondary  [  ] occluded  Patency judged by: [  ] doppler  [  ] palp graft pulse  [  ] palp distal pulse   [  ] ABI increase > 15%   [ x ] Duplex  If occluded, when-   Ipsilateral ABI: non compressible                             Ipsilateral TBI: non compressible  Infection: [ x ] none  [  ] cellulitis  [  ] deep abscess  [  ] infection of artery or graft  Bypass revision: [x  ] no  [  ] yes- [  ] surg  [  ] catheter based  [  ] both    Date:   Thrombectomy/ lysis/ revision:  [ x ] no  [  ] yes- [  ] surg  [  ] catheter based  [  ] both    Date:   Major amputation: [  ] no  [  x]  minor amp at time of initial bypass [  ] BKA  [  ] AKA   Date:

## 2015-07-20 NOTE — Addendum Note (Signed)
Addended by: Thresa Ross C on: 07/20/2015 10:14 AM   Modules accepted: Orders

## 2015-08-28 DIAGNOSIS — E113493 Type 2 diabetes mellitus with severe nonproliferative diabetic retinopathy without macular edema, bilateral: Secondary | ICD-10-CM | POA: Diagnosis not present

## 2015-08-28 DIAGNOSIS — H353134 Nonexudative age-related macular degeneration, bilateral, advanced atrophic with subfoveal involvement: Secondary | ICD-10-CM | POA: Diagnosis not present

## 2015-08-28 DIAGNOSIS — H43813 Vitreous degeneration, bilateral: Secondary | ICD-10-CM | POA: Diagnosis not present

## 2015-08-28 DIAGNOSIS — H3563 Retinal hemorrhage, bilateral: Secondary | ICD-10-CM | POA: Diagnosis not present

## 2015-09-06 DIAGNOSIS — E1159 Type 2 diabetes mellitus with other circulatory complications: Secondary | ICD-10-CM | POA: Diagnosis not present

## 2015-09-06 DIAGNOSIS — I10 Essential (primary) hypertension: Secondary | ICD-10-CM | POA: Diagnosis not present

## 2015-09-11 DIAGNOSIS — Z23 Encounter for immunization: Secondary | ICD-10-CM | POA: Diagnosis not present

## 2015-10-02 DIAGNOSIS — E1140 Type 2 diabetes mellitus with diabetic neuropathy, unspecified: Secondary | ICD-10-CM | POA: Diagnosis not present

## 2015-10-02 DIAGNOSIS — E1151 Type 2 diabetes mellitus with diabetic peripheral angiopathy without gangrene: Secondary | ICD-10-CM | POA: Diagnosis not present

## 2015-12-04 DIAGNOSIS — I10 Essential (primary) hypertension: Secondary | ICD-10-CM | POA: Diagnosis not present

## 2015-12-04 DIAGNOSIS — E784 Other hyperlipidemia: Secondary | ICD-10-CM | POA: Diagnosis not present

## 2015-12-04 DIAGNOSIS — E1159 Type 2 diabetes mellitus with other circulatory complications: Secondary | ICD-10-CM | POA: Diagnosis not present

## 2015-12-10 DIAGNOSIS — E1140 Type 2 diabetes mellitus with diabetic neuropathy, unspecified: Secondary | ICD-10-CM | POA: Diagnosis not present

## 2015-12-10 DIAGNOSIS — E1151 Type 2 diabetes mellitus with diabetic peripheral angiopathy without gangrene: Secondary | ICD-10-CM | POA: Diagnosis not present

## 2016-01-21 DIAGNOSIS — R921 Mammographic calcification found on diagnostic imaging of breast: Secondary | ICD-10-CM | POA: Diagnosis not present

## 2016-01-21 DIAGNOSIS — Z1231 Encounter for screening mammogram for malignant neoplasm of breast: Secondary | ICD-10-CM | POA: Diagnosis not present

## 2016-02-06 DIAGNOSIS — R921 Mammographic calcification found on diagnostic imaging of breast: Secondary | ICD-10-CM | POA: Diagnosis not present

## 2016-02-18 DIAGNOSIS — E1151 Type 2 diabetes mellitus with diabetic peripheral angiopathy without gangrene: Secondary | ICD-10-CM | POA: Diagnosis not present

## 2016-02-18 DIAGNOSIS — E1140 Type 2 diabetes mellitus with diabetic neuropathy, unspecified: Secondary | ICD-10-CM | POA: Diagnosis not present

## 2016-02-26 DIAGNOSIS — H353113 Nonexudative age-related macular degeneration, right eye, advanced atrophic without subfoveal involvement: Secondary | ICD-10-CM | POA: Diagnosis not present

## 2016-02-26 DIAGNOSIS — H3563 Retinal hemorrhage, bilateral: Secondary | ICD-10-CM | POA: Diagnosis not present

## 2016-02-26 DIAGNOSIS — E113493 Type 2 diabetes mellitus with severe nonproliferative diabetic retinopathy without macular edema, bilateral: Secondary | ICD-10-CM | POA: Diagnosis not present

## 2016-02-26 DIAGNOSIS — H43813 Vitreous degeneration, bilateral: Secondary | ICD-10-CM | POA: Diagnosis not present

## 2016-03-06 DIAGNOSIS — E1122 Type 2 diabetes mellitus with diabetic chronic kidney disease: Secondary | ICD-10-CM | POA: Diagnosis not present

## 2016-03-06 DIAGNOSIS — E784 Other hyperlipidemia: Secondary | ICD-10-CM | POA: Diagnosis not present

## 2016-03-06 DIAGNOSIS — I10 Essential (primary) hypertension: Secondary | ICD-10-CM | POA: Diagnosis not present

## 2016-03-18 ENCOUNTER — Ambulatory Visit: Payer: Medicare Other | Admitting: Cardiology

## 2016-03-20 ENCOUNTER — Other Ambulatory Visit: Payer: Self-pay | Admitting: Cardiology

## 2016-04-04 ENCOUNTER — Ambulatory Visit (INDEPENDENT_AMBULATORY_CARE_PROVIDER_SITE_OTHER): Payer: Medicare Other | Admitting: Cardiology

## 2016-04-04 ENCOUNTER — Encounter: Payer: Self-pay | Admitting: Cardiology

## 2016-04-04 VITALS — BP 164/76 | HR 87 | Resp 95 | Ht 60.0 in | Wt 136.0 lb

## 2016-04-04 DIAGNOSIS — I739 Peripheral vascular disease, unspecified: Secondary | ICD-10-CM

## 2016-04-04 DIAGNOSIS — I2510 Atherosclerotic heart disease of native coronary artery without angina pectoris: Secondary | ICD-10-CM | POA: Diagnosis not present

## 2016-04-04 DIAGNOSIS — R600 Localized edema: Secondary | ICD-10-CM

## 2016-04-04 DIAGNOSIS — E785 Hyperlipidemia, unspecified: Secondary | ICD-10-CM

## 2016-04-04 DIAGNOSIS — I10 Essential (primary) hypertension: Secondary | ICD-10-CM

## 2016-04-04 NOTE — Patient Instructions (Signed)

## 2016-04-04 NOTE — Progress Notes (Addendum)
Clinical Summary Ms. Sow is a 82 y.o.female seen today for follow up of the following medical problems.   1. CAD  - MPI 12/2011 as part of preoperative vascular surgery evaluation, found to have inferior wall ischemia with normal LVEF  - referred for cath, received a BMS to her RCA  - she is not on ASA because of prior anemia and GI bleed    - no recent chest pain. No SOB or DOe - compliant with meds  2. HTN  - compliant with meds  - does not check regularly at home.   3. Hyperlipidemia  - compliant with atorvastatin  - 01/2015 TC 116 TG 168 HDL 55 LDL 27  4. PAD  - followed by vascular, prior stenting, prior fem-pop bypass Aug 2013 - denies any leg pains since last visit  5. LE edema - denies any recent swelling - on diuretics previously had significant electrolyte abnormalities including HypoNa, hypoK, and hypoMg.     Past Medical History:  Diagnosis Date  . Anemia   . Arthritis    "dr said I have it in my back" Lower   . Carotid artery occlusion   . COPD (chronic obstructive pulmonary disease) ([place])   . GERD (gastroesophageal reflux disease)   . History of blood transfusion 11/11/11  . Hyperlipidemia   . Hypertension   . PVD (peripheral vascular disease) ([place])    Stents left SFA 2004 Dr. Albertine Patricia  . Type II diabetes mellitus ([facility])      Allergies  Allergen Reactions  . Aspirin Other (See Comments)    Dr. Britta Mccreedy informed patient not to take this due to ulcer  . Ibuprofen Other (See Comments)    Dr. Britta Mccreedy informed patient not to take this due to ulcer      Current Outpatient Prescriptions  Medication Sig Dispense Refill  . amLODipine (NORVASC) 10 MG tablet TAKE ONE TABLET BY MOUTH DAILY. 90 tablet 3  . atorvastatin (LIPITOR) 80 MG tablet Take 80 mg by mouth daily.    . calcium-vitamin D (OSCAL WITH D) 500-200 MG-UNIT per tablet Take 1 tablet by mouth 2 (two) times daily.    . cholecalciferol (VITAMIN D) 1000 UNITS tablet Take 1,000  Units by mouth daily.    . clopidogrel (PLAVIX) 75 MG tablet Take 1 tablet (75 mg total) by mouth daily. 90 tablet 3  . ferrous sulfate 325 (65 FE) MG tablet Take 325 mg by mouth 2 (two) times daily.    Marland Kitchen glipiZIDE (GLUCOTROL) 5 MG tablet Take 5 mg by mouth 2 (two) times daily before a meal.    . ibuprofen (ADVIL,MOTRIN) 200 MG tablet Take 400 mg by mouth every morning.    Marland Kitchen lisinopril (PRINIVIL,ZESTRIL) 40 MG tablet Take 1 tablet (40 mg total) by mouth daily. 30 tablet 6  . magnesium oxide (MAG-OX) 400 MG tablet Take 400 mg by mouth daily.    . metFORMIN (GLUCOPHAGE) 500 MG tablet Take 500 mg by mouth 3 (three) times daily.    . metoprolol tartrate (LOPRESSOR) 25 MG tablet Take 1 tablet (25 mg total) by mouth 2 (two) times daily. 180 tablet 3  . nitroGLYCERIN (NITROSTAT) 0.4 MG SL tablet Place 0.4 mg under the tongue every 5 (five) minutes as needed for chest pain.    . Omega-3 Fatty Acids (FISH OIL) 1000 MG CAPS Take 1 capsule by mouth daily.    Marland Kitchen omeprazole (PRILOSEC) 40 MG capsule Take 40 mg by mouth as needed.    Marland Kitchen  traMADol (ULTRAM) 50 MG tablet Take 50 mg by mouth 2 (two) times daily.      No current facility-administered medications for this visit.      Past Surgical History:  Procedure Laterality Date  . ABDOMINAL AORTAGRAM N/A 10/31/2011   Procedure: ABDOMINAL Maxcine Ham;  Surgeon: Elam Dutch, MD;  Location: [facility] CATH LAB;  Service: Cardiovascular;  Laterality: N/A;  . AMPUTATION  03/23/2012   Procedure: AMPUTATION DIGIT;  Surgeon: Elam Dutch, MD;  Location: [facility] OR;  Service: Vascular;  Laterality: Right;  Right Femoral endarterectomy with profundaplasty; right femoral-popliteal bypass with nonreversed saphenous vein; and right first toe amputation  . CATARACT EXTRACTION W/ INTRAOCULAR LENS  IMPLANT, BILATERAL  2002  . COLONOSCOPY    . CORONARY ANGIOPLASTY WITH STENT PLACEMENT  01/19/12   "1; first one I've ever had"  . EYE SURGERY  2002   Cataract  . EYE SURGERY  2009    Laser bilateral   . FEMORAL ENDARTERECTOMY  03/23/2012   right  . FEMORAL-POPLITEAL BYPASS GRAFT  03/23/2012   Procedure: BYPASS GRAFT FEMORAL-POPLITEAL ARTERY;  Surgeon: Elam Dutch, MD;  Location: [facility] OR;  Service: Vascular;  Laterality: Right;  Right Femoral endarterectomy with profundaplasty; right femoral-popliteal bypass with nonreversed saphenous vein; and right first toe amputation  . PERCUTANEOUS CORONARY STENT INTERVENTION (PCI-S) N/A 01/19/2012   Procedure: PERCUTANEOUS CORONARY STENT INTERVENTION (PCI-S);  Surgeon: Peter M Martinique, MD;  Location: [facility] CATH LAB;  Service: Cardiovascular;  Laterality: N/A;  . PERIPHERAL ARTERIAL STENT GRAFT  2003   LLE  . POSTERIOR LAMINECTOMY / DECOMPRESSION LUMBAR SPINE  1989  . REFRACTIVE SURGERY  2009   bilaterally  . TONSILLECTOMY AND ADENOIDECTOMY  1946     Allergies  Allergen Reactions  . Aspirin Other (See Comments)    Dr. Britta Mccreedy informed patient not to take this due to ulcer  . Ibuprofen Other (See Comments)    Dr. Britta Mccreedy informed patient not to take this due to ulcer       Family History  Problem Relation Age of Onset  . Stroke Father 8  . Hypertension Father   . Stroke Mother 78  . Diabetes Mother   . Hypertension Mother   . Diabetes Sister   . Hyperlipidemia Sister   . Hypertension Sister   . Diabetes Brother   . Hypertension Brother      Social History Ms. Fontanilla reports that she quit smoking about 28 years ago. Her smoking use included Cigarettes. She started smoking about 52 years ago. She has a 24.00 pack-year smoking history. She has never used smokeless tobacco. Ms. Drumgoole reports that she does not drink alcohol.   Review of Systems CONSTITUTIONAL: No weight loss, fever, chills, weakness or fatigue.  HEENT: Eyes: No visual loss, blurred vision, double vision or yellow sclerae.No hearing loss, sneezing, congestion, runny nose or sore throat.  SKIN: No rash or itching.  CARDIOVASCULAR: per HPI RESPIRATORY: No  shortness of breath, cough or sputum.  GASTROINTESTINAL: No anorexia, nausea, vomiting or diarrhea. No abdominal pain or blood.  GENITOURINARY: No burning on urination, no polyuria NEUROLOGICAL: No headache, dizziness, syncope, paralysis, ataxia, numbness or tingling in the extremities. No change in bowel or bladder control.  MUSCULOSKELETAL: No muscle, back pain, joint pain or stiffness.  LYMPHATICS: No enlarged nodes. No history of splenectomy.  PSYCHIATRIC: No history of depression or anxiety.  ENDOCRINOLOGIC: No reports of sweating, cold or heat intolerance. No polyuria or polydipsia.  Marland Kitchen   Physical Examination Vitals:  04/04/16 1354  BP: (!) 164/76  Pulse: 87  Resp: (!) 95   Vitals:   04/04/16 1354  Weight: 136 lb (61.7 kg)  Height: 5' (1.524 m)   Manual bp: 140/70  Gen: resting comfortably, no acute distress HEENT: no scleral icterus, pupils equal round and reactive, no palptable cervical adenopathy,  CV: RRR, 2/6 systolic , no jvd Resp: Clear to auscultation bilaterally GI: abdomen is soft, non-tender, non-distended, normal bowel sounds, no hepatosplenomegaly MSK: extremities are warm, no edema.  Skin: warm, no rash Neuro:  no focal deficits Psych: appropriate affect   Diagnostic Studies 12/2011 Stress MPI: inferior wall ischemia, normal LVEF   12/2011 Cath  Procedural Findings:  Hemodynamics:  AO 179/62  LV 192/12  Coronary angiography:  Coronary dominance: Right  Left mainstem: Moderately calcified. Distal 60%.  Left anterior descending (LAD): Probable ostial 60% (difficult to fully lay out the ostial circ/LAD/RI).  Ramus Intermedius: Moderate sized. It appears to be free of high grade disease although it is difficult to visualize the ostium.  Left circumflex (LCx): AV groove ostial 40%. OM1 moderate with ostial 50%. OM2 moderate and branching without high grade disease  Right coronary artery (RCA): Large. Heavy calcification. Long proximal 30%.  Focal mid 99% followed by 95%. PDA small to moderate and normal. PL x 2 small to moderate and normal.  Left ventriculography: Left ventricular systolic function is normal, LVEF is estimated at 65%, there is no significant mitral regurgitation  Final Conclusions: Heavy valve calcification. Severe single vessel CAD with moderate disease elsewhere  Recommendations: I will review with colleagues for consideration of PCI of the RCA before possible lower extremity surgery.   Lesion Data:  Vessel: Mid RCA  Percent stenosis (pre): 90%  TIMI-flow (pre): 3  Stent: 2.5 x 23 mm MultiLink mini vision stent  Percent stenosis (post): 0%  TIMI-flow (post): 3  Conclusions:  1. Successful intracoronary stenting of the mid right coronary with a bare-metal stent. Unable to cross the lesion at the crux.   06/15/13 Clinic EKG: sinus rhythm, normal axis, no ischemic changes   06/2013 Echo  LVEF 65-60%, mild LVH, grade I diastolic dysfunction, aortic sclerosis without stenosis    Assessment and Plan  1. CAD  - no current symptoms  - continue current medications and risk factor modfication  - bleeding on ASA previously, continue plavix 75mg  daily for secondary prevention - ekg shows nsr, no ischemic changes  2. Hyperlipidemia  - lipids at goal, we will continue current meds   3. HTN  - at goal by manual bp check at 140/70, continue current meds  4. PAD  - she will continue to follow with vascular   5. LE edema  - has not required diuretic - daily lasix caused significant electrolyte abnormalities, if needed would only prescribe low dose on prn basis   F/u 1 year      Arnoldo Lenis, M.D.

## 2016-05-05 DIAGNOSIS — Z23 Encounter for immunization: Secondary | ICD-10-CM | POA: Diagnosis not present

## 2016-05-05 DIAGNOSIS — E1140 Type 2 diabetes mellitus with diabetic neuropathy, unspecified: Secondary | ICD-10-CM | POA: Diagnosis not present

## 2016-05-05 DIAGNOSIS — E1151 Type 2 diabetes mellitus with diabetic peripheral angiopathy without gangrene: Secondary | ICD-10-CM | POA: Diagnosis not present

## 2016-06-02 DIAGNOSIS — I10 Essential (primary) hypertension: Secondary | ICD-10-CM | POA: Diagnosis not present

## 2016-06-02 DIAGNOSIS — E784 Other hyperlipidemia: Secondary | ICD-10-CM | POA: Diagnosis not present

## 2016-06-02 DIAGNOSIS — E1122 Type 2 diabetes mellitus with diabetic chronic kidney disease: Secondary | ICD-10-CM | POA: Diagnosis not present

## 2016-06-02 DIAGNOSIS — M545 Low back pain: Secondary | ICD-10-CM | POA: Diagnosis not present

## 2016-06-16 ENCOUNTER — Other Ambulatory Visit: Payer: Self-pay | Admitting: Cardiology

## 2016-07-14 DIAGNOSIS — E1151 Type 2 diabetes mellitus with diabetic peripheral angiopathy without gangrene: Secondary | ICD-10-CM | POA: Diagnosis not present

## 2016-07-14 DIAGNOSIS — E1140 Type 2 diabetes mellitus with diabetic neuropathy, unspecified: Secondary | ICD-10-CM | POA: Diagnosis not present

## 2016-07-17 ENCOUNTER — Encounter: Payer: Self-pay | Admitting: Vascular Surgery

## 2016-07-24 ENCOUNTER — Encounter (HOSPITAL_COMMUNITY): Payer: Medicare Other

## 2016-07-24 ENCOUNTER — Ambulatory Visit: Payer: Medicare Other | Admitting: Vascular Surgery

## 2016-07-24 ENCOUNTER — Other Ambulatory Visit (HOSPITAL_COMMUNITY): Payer: Medicare Other

## 2016-07-31 ENCOUNTER — Ambulatory Visit (INDEPENDENT_AMBULATORY_CARE_PROVIDER_SITE_OTHER)
Admission: RE | Admit: 2016-07-31 | Discharge: 2016-07-31 | Disposition: A | Discharge status: Home / Self Care | Payer: Medicare Other | Source: Ambulatory Visit | Attending: Vascular Surgery | Admitting: Vascular Surgery

## 2016-07-31 ENCOUNTER — Ambulatory Visit (INDEPENDENT_AMBULATORY_CARE_PROVIDER_SITE_OTHER): Payer: Medicare Other | Admitting: Vascular Surgery

## 2016-07-31 ENCOUNTER — Ambulatory Visit (HOSPITAL_COMMUNITY)
Admission: RE | Admit: 2016-07-31 | Discharge: 2016-07-31 | Disposition: A | Discharge status: Home / Self Care | Payer: Medicare Other | Source: Ambulatory Visit | Attending: Vascular Surgery | Admitting: Vascular Surgery

## 2016-07-31 VITALS — BP 160/75 | HR 82 | Temp 97.2°F | Resp 16 | Ht 60.0 in | Wt 132.0 lb

## 2016-07-31 DIAGNOSIS — I6523 Occlusion and stenosis of bilateral carotid arteries: Secondary | ICD-10-CM

## 2016-07-31 DIAGNOSIS — R9439 Abnormal result of other cardiovascular function study: Secondary | ICD-10-CM | POA: Diagnosis not present

## 2016-07-31 DIAGNOSIS — I739 Peripheral vascular disease, unspecified: Secondary | ICD-10-CM | POA: Diagnosis not present

## 2016-07-31 NOTE — Progress Notes (Signed)
[facility] [place]  Patient name: Cynthia Ray MRN: [id number] DOB: [birth year] Sex: female  REASON FOR VISIT: follow-up  HPI: Cynthia Ray is a 83 y.o. female who previously underwent right femoral to popliteal artery bypass and amputation of the distal tip of right first toe in August 2013 by Dr. Oneida Alar. 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 The patient denies any nonhealing wounds or rest pain. She ambulates with the assistance of a cane sometimes. She is able to go to the grocery store and accomplish daily tasks without issue.  She is also been followed for carotid stenosis. She denies amaurosis fugax, sudden onset weakness or numbness in extremities and slurred speech.  Her past medical history includes hyperlipidemia, hypertension and diabetes which have all been stable.She is unable to take aspirin. She is on a statin. She is a former smoker.    Past Medical History:  Diagnosis Date  . Anemia   . Arthritis    "dr said I have it in my back" Lower   . Carotid artery occlusion   . COPD (chronic obstructive pulmonary disease) ([place])   . GERD (gastroesophageal reflux disease)   . History of blood transfusion 11/11/11  . Hyperlipidemia   . Hypertension   . PVD (peripheral vascular disease) ([place])    Stents left SFA 2004 Dr. Albertine Patricia  . Type II diabetes mellitus ([facility])     Family History  Problem Relation Age of Onset  . Stroke Father 13  . Hypertension Father   . Stroke Mother 70  . Diabetes Mother   . Hypertension Mother   . Diabetes Sister   . Hyperlipidemia Sister   . Hypertension Sister   . Diabetes Brother   . Hypertension Brother     SOCIAL HISTORY: Social History  Substance Use Topics  . Smoking status: Former Smoker    Packs/day: 1.00    Years: 24.00    Types: Cigarettes    Start date: 07/29/1963    Quit date:  07/29/1987  . Smokeless tobacco: Never Used  . Alcohol use No    Allergies  Allergen Reactions  . Aspirin Other (See Comments)    Dr. Britta Mccreedy informed patient not to take this due to ulcer  . Ibuprofen Other (See Comments)    Dr. Britta Mccreedy informed patient not to take this due to ulcer     Current Outpatient Prescriptions  Medication Sig Dispense Refill  . amLODipine (NORVASC) 10 MG tablet TAKE ONE TABLET BY MOUTH DAILY. 90 tablet 3  . atorvastatin (LIPITOR) 80 MG tablet Take 80 mg by mouth daily.    . calcium-vitamin D (OSCAL WITH D) 500-200 MG-UNIT per tablet Take 1 tablet by mouth 2 (two) times daily.    . cholecalciferol (VITAMIN D) 1000 UNITS tablet Take 1,000 Units by mouth daily.    . clopidogrel (PLAVIX) 75 MG tablet TAKE ONE TABLET BY MOUTH DAILY. 90 tablet 0  . ferrous sulfate 325 (65 FE) MG tablet Take 325 mg by mouth 2 (two) times daily.    Marland Kitchen glipiZIDE (GLUCOTROL) 5 MG tablet Take 5 mg by mouth 2 (two) times daily before a meal.    . lisinopril (PRINIVIL,ZESTRIL) 40 MG tablet Take 1 tablet (40 mg total) by mouth daily. 30 tablet 6  . magnesium oxide (MAG-OX) 400 MG tablet Take 400 mg by mouth daily.    . metFORMIN (GLUCOPHAGE)  500 MG tablet Take 500 mg by mouth 3 (three) times daily.    . metoprolol tartrate (LOPRESSOR) 25 MG tablet Take 1 tablet (25 mg total) by mouth 2 (two) times daily. 180 tablet 3  . nitroGLYCERIN (NITROSTAT) 0.4 MG SL tablet Place 0.4 mg under the tongue every 5 (five) minutes as needed for chest pain.    . Omega-3 Fatty Acids (FISH OIL) 1000 MG CAPS Take 1 capsule by mouth daily.    Marland Kitchen omeprazole (PRILOSEC) 40 MG capsule Take 40 mg by mouth as needed.    . traMADol (ULTRAM) 50 MG tablet Take 50 mg by mouth 2 (two) times daily.      No current facility-administered medications for this visit.     REVIEW OF SYSTEMS:  [X]  denotes positive finding, [ ]  denotes negative finding Cardiac  Comments:  Chest pain or chest pressure:    Shortness of breath upon  exertion:    Short of breath when lying flat:    Irregular heart rhythm:        Vascular    Pain in calf, thigh, or hip brought on by ambulation:    Pain in feet at night that wakes you up from your sleep:     Blood clot in your veins:    Leg swelling:  x       Pulmonary    Oxygen at home:    Productive cough:     Wheezing:         Neurologic    Sudden weakness in arms or legs:     Sudden numbness in arms or legs:     Sudden onset of difficulty speaking or slurred speech:    Temporary loss of vision in one eye:     Problems with dizziness:         Gastrointestinal    Blood in stool:     Vomited blood:         Genitourinary    Burning when urinating:     Blood in urine:        Psychiatric    Major depression:         Hematologic    Bleeding problems:    Problems with blood clotting too easily:        Skin    Rashes or ulcers:        Constitutional    Fever or chills:      PHYSICAL EXAM: There were no vitals filed for this visit.  GENERAL: The patient is a well-nourished female, in no acute distress. The vital signs are documented above. CARDIAC: There is a regular rate and rhythm. No carotid bruits. VASCULAR: 2+ right femoral pulse. 1+ left femoral pulse. Nonpalpable popliteal and pedal pulses. PULMONARY: There is good air exchange bilaterally without wheezing or rales. ABDOMEN: Soft and non-tender with normal pitched bowel sounds.  MUSCULOSKELETAL: Bilateral lower extremity edema, right greater than left. Right first toe amputation. NEUROLOGIC: No focal weakness or paresthesias are detected. SKIN: There are no ulcers or rashes noted. PSYCHIATRIC: The patient has a normal affect.  DATA:  Carotid duplex 07/31/2016 40-59% right internal carotid artery stenosis 1-39% left proximal internal carotid artery stenosis Vertebral arteries with antegrade flow bilaterally  Lower extremity arterial duplex and ABIs 07/31/2016  Right femoral to below-knee popliteal  bypass graft is patent with biphasic waveforms. There is no evidence of stenosis. ABIs are unobtainable due to noncompressible vessels.   MEDICAL ISSUES: Peripheral arterial disease  The patient's right lower  extremity bypass graft is patent. She denies any issues with her legs bilaterally. Will follow-up in 1 year with repeat duplex and ABIs. Patient knows to contact our office sooner if she develops any issues.  Carotid artery stenosis  The patient denies any TIA or stroke symptoms. Her carotid stenosis has been stable since her duplex last year. Follow-up in 1 year with repeat carotid duplex. The patient understands that if she develops any neurological symptoms to contact our office and/or seek emergency assistance.   Virgina Jock, PA-C [facility] MD: Oneida Alar

## 2016-08-05 NOTE — Addendum Note (Signed)
Addended by: Lianne Cure A on: 08/05/2016 03:32 PM   Modules accepted: Orders

## 2016-08-19 DIAGNOSIS — E119 Type 2 diabetes mellitus without complications: Secondary | ICD-10-CM | POA: Diagnosis not present

## 2016-08-19 DIAGNOSIS — Z7984 Long term (current) use of oral hypoglycemic drugs: Secondary | ICD-10-CM | POA: Diagnosis not present

## 2016-08-20 DIAGNOSIS — R921 Mammographic calcification found on diagnostic imaging of breast: Secondary | ICD-10-CM | POA: Diagnosis not present

## 2016-09-01 DIAGNOSIS — E784 Other hyperlipidemia: Secondary | ICD-10-CM | POA: Diagnosis not present

## 2016-09-01 DIAGNOSIS — Z0000 Encounter for general adult medical examination without abnormal findings: Secondary | ICD-10-CM | POA: Diagnosis not present

## 2016-09-01 DIAGNOSIS — Z1389 Encounter for screening for other disorder: Secondary | ICD-10-CM | POA: Diagnosis not present

## 2016-09-01 DIAGNOSIS — M545 Low back pain: Secondary | ICD-10-CM | POA: Diagnosis not present

## 2016-09-01 DIAGNOSIS — E1122 Type 2 diabetes mellitus with diabetic chronic kidney disease: Secondary | ICD-10-CM | POA: Diagnosis not present

## 2016-09-01 DIAGNOSIS — I10 Essential (primary) hypertension: Secondary | ICD-10-CM | POA: Diagnosis not present

## 2016-09-30 DIAGNOSIS — B351 Tinea unguium: Secondary | ICD-10-CM | POA: Diagnosis not present

## 2016-09-30 DIAGNOSIS — E1140 Type 2 diabetes mellitus with diabetic neuropathy, unspecified: Secondary | ICD-10-CM | POA: Diagnosis not present

## 2016-09-30 DIAGNOSIS — M79671 Pain in right foot: Secondary | ICD-10-CM | POA: Diagnosis not present

## 2016-09-30 DIAGNOSIS — E1151 Type 2 diabetes mellitus with diabetic peripheral angiopathy without gangrene: Secondary | ICD-10-CM | POA: Diagnosis not present

## 2016-10-28 DIAGNOSIS — H3563 Retinal hemorrhage, bilateral: Secondary | ICD-10-CM | POA: Diagnosis not present

## 2016-10-28 DIAGNOSIS — H353124 Nonexudative age-related macular degeneration, left eye, advanced atrophic with subfoveal involvement: Secondary | ICD-10-CM | POA: Diagnosis not present

## 2016-10-28 DIAGNOSIS — H43813 Vitreous degeneration, bilateral: Secondary | ICD-10-CM | POA: Diagnosis not present

## 2016-10-28 DIAGNOSIS — H353113 Nonexudative age-related macular degeneration, right eye, advanced atrophic without subfoveal involvement: Secondary | ICD-10-CM | POA: Diagnosis not present

## 2016-10-28 DIAGNOSIS — E113493 Type 2 diabetes mellitus with severe nonproliferative diabetic retinopathy without macular edema, bilateral: Secondary | ICD-10-CM | POA: Diagnosis not present

## 2016-11-24 ENCOUNTER — Other Ambulatory Visit: Payer: Self-pay | Admitting: Cardiology

## 2016-11-28 DIAGNOSIS — M545 Low back pain: Secondary | ICD-10-CM | POA: Diagnosis not present

## 2016-11-28 DIAGNOSIS — E113293 Type 2 diabetes mellitus with mild nonproliferative diabetic retinopathy without macular edema, bilateral: Secondary | ICD-10-CM | POA: Diagnosis not present

## 2016-11-28 DIAGNOSIS — E1122 Type 2 diabetes mellitus with diabetic chronic kidney disease: Secondary | ICD-10-CM | POA: Diagnosis not present

## 2016-11-28 DIAGNOSIS — I10 Essential (primary) hypertension: Secondary | ICD-10-CM | POA: Diagnosis not present

## 2016-11-28 DIAGNOSIS — K210 Gastro-esophageal reflux disease with esophagitis: Secondary | ICD-10-CM | POA: Diagnosis not present

## 2016-11-28 DIAGNOSIS — E784 Other hyperlipidemia: Secondary | ICD-10-CM | POA: Diagnosis not present

## 2016-12-16 DIAGNOSIS — E1151 Type 2 diabetes mellitus with diabetic peripheral angiopathy without gangrene: Secondary | ICD-10-CM | POA: Diagnosis not present

## 2016-12-16 DIAGNOSIS — E1140 Type 2 diabetes mellitus with diabetic neuropathy, unspecified: Secondary | ICD-10-CM | POA: Diagnosis not present

## 2017-02-18 DIAGNOSIS — R921 Mammographic calcification found on diagnostic imaging of breast: Secondary | ICD-10-CM | POA: Diagnosis not present

## 2017-02-24 DIAGNOSIS — E1151 Type 2 diabetes mellitus with diabetic peripheral angiopathy without gangrene: Secondary | ICD-10-CM | POA: Diagnosis not present

## 2017-02-24 DIAGNOSIS — E1140 Type 2 diabetes mellitus with diabetic neuropathy, unspecified: Secondary | ICD-10-CM | POA: Diagnosis not present

## 2017-03-02 DIAGNOSIS — M25511 Pain in right shoulder: Secondary | ICD-10-CM | POA: Diagnosis not present

## 2017-03-02 DIAGNOSIS — E1122 Type 2 diabetes mellitus with diabetic chronic kidney disease: Secondary | ICD-10-CM | POA: Diagnosis not present

## 2017-03-02 DIAGNOSIS — M545 Low back pain: Secondary | ICD-10-CM | POA: Diagnosis not present

## 2017-03-02 DIAGNOSIS — E113293 Type 2 diabetes mellitus with mild nonproliferative diabetic retinopathy without macular edema, bilateral: Secondary | ICD-10-CM | POA: Diagnosis not present

## 2017-03-02 DIAGNOSIS — K210 Gastro-esophageal reflux disease with esophagitis: Secondary | ICD-10-CM | POA: Diagnosis not present

## 2017-03-02 DIAGNOSIS — I10 Essential (primary) hypertension: Secondary | ICD-10-CM | POA: Diagnosis not present

## 2017-03-02 DIAGNOSIS — E784 Other hyperlipidemia: Secondary | ICD-10-CM | POA: Diagnosis not present

## 2017-03-23 ENCOUNTER — Other Ambulatory Visit: Payer: Self-pay | Admitting: Cardiology

## 2017-05-19 DIAGNOSIS — E1140 Type 2 diabetes mellitus with diabetic neuropathy, unspecified: Secondary | ICD-10-CM | POA: Diagnosis not present

## 2017-05-19 DIAGNOSIS — E1151 Type 2 diabetes mellitus with diabetic peripheral angiopathy without gangrene: Secondary | ICD-10-CM | POA: Diagnosis not present

## 2017-06-02 DIAGNOSIS — H43813 Vitreous degeneration, bilateral: Secondary | ICD-10-CM | POA: Diagnosis not present

## 2017-06-02 DIAGNOSIS — E113493 Type 2 diabetes mellitus with severe nonproliferative diabetic retinopathy without macular edema, bilateral: Secondary | ICD-10-CM | POA: Diagnosis not present

## 2017-06-02 DIAGNOSIS — H353124 Nonexudative age-related macular degeneration, left eye, advanced atrophic with subfoveal involvement: Secondary | ICD-10-CM | POA: Diagnosis not present

## 2017-06-02 DIAGNOSIS — H353113 Nonexudative age-related macular degeneration, right eye, advanced atrophic without subfoveal involvement: Secondary | ICD-10-CM | POA: Diagnosis not present

## 2017-06-09 DIAGNOSIS — I10 Essential (primary) hypertension: Secondary | ICD-10-CM | POA: Diagnosis not present

## 2017-06-09 DIAGNOSIS — E1122 Type 2 diabetes mellitus with diabetic chronic kidney disease: Secondary | ICD-10-CM | POA: Diagnosis not present

## 2017-06-09 DIAGNOSIS — E113293 Type 2 diabetes mellitus with mild nonproliferative diabetic retinopathy without macular edema, bilateral: Secondary | ICD-10-CM | POA: Diagnosis not present

## 2017-06-09 DIAGNOSIS — M25512 Pain in left shoulder: Secondary | ICD-10-CM | POA: Diagnosis not present

## 2017-06-09 DIAGNOSIS — M545 Low back pain: Secondary | ICD-10-CM | POA: Diagnosis not present

## 2017-06-09 DIAGNOSIS — K210 Gastro-esophageal reflux disease with esophagitis: Secondary | ICD-10-CM | POA: Diagnosis not present

## 2017-06-09 DIAGNOSIS — E7849 Other hyperlipidemia: Secondary | ICD-10-CM | POA: Diagnosis not present

## 2017-08-06 ENCOUNTER — Encounter: Payer: Self-pay | Admitting: Family

## 2017-08-06 ENCOUNTER — Ambulatory Visit (INDEPENDENT_AMBULATORY_CARE_PROVIDER_SITE_OTHER): Payer: Medicare Other | Admitting: Family

## 2017-08-06 ENCOUNTER — Ambulatory Visit (INDEPENDENT_AMBULATORY_CARE_PROVIDER_SITE_OTHER)
Admission: RE | Admit: 2017-08-06 | Discharge: 2017-08-06 | Disposition: A | Discharge status: Home / Self Care | Payer: Medicare Other | Source: Ambulatory Visit | Attending: Family | Admitting: Family

## 2017-08-06 ENCOUNTER — Ambulatory Visit (HOSPITAL_COMMUNITY)
Admission: RE | Admit: 2017-08-06 | Discharge: 2017-08-06 | Disposition: A | Discharge status: Home / Self Care | Payer: Medicare Other | Source: Ambulatory Visit | Attending: Family | Admitting: Family

## 2017-08-06 VITALS — BP 153/71 | HR 82 | Temp 98.1°F | Resp 18 | Ht 60.0 in | Wt 132.0 lb

## 2017-08-06 DIAGNOSIS — Z87891 Personal history of nicotine dependence: Secondary | ICD-10-CM

## 2017-08-06 DIAGNOSIS — I6523 Occlusion and stenosis of bilateral carotid arteries: Secondary | ICD-10-CM | POA: Diagnosis not present

## 2017-08-06 DIAGNOSIS — I779 Disorder of arteries and arterioles, unspecified: Secondary | ICD-10-CM

## 2017-08-06 DIAGNOSIS — Z23 Encounter for immunization: Secondary | ICD-10-CM | POA: Diagnosis not present

## 2017-08-06 DIAGNOSIS — I739 Peripheral vascular disease, unspecified: Secondary | ICD-10-CM | POA: Diagnosis not present

## 2017-08-06 LAB — VAS US CAROTID
LEFT VERTEBRAL DIAS: 15 cm/s
Left CCA dist dias: 10 cm/s
Left CCA dist sys: 78 cm/s
Left CCA prox dias: 8 cm/s
Left CCA prox sys: 72 cm/s
Left ICA dist dias: 13 cm/s
Left ICA dist sys: 63 cm/s
Left ICA prox dias: -12 cm/s
Left ICA prox sys: -50 cm/s
RIGHT CCA MID DIAS: 14 cm/s
RIGHT VERTEBRAL DIAS: 12 cm/s
Right CCA prox dias: -11 cm/s
Right CCA prox sys: -76 cm/s
Right cca dist sys: -155 cm/s

## 2017-08-06 NOTE — Patient Instructions (Addendum)
Stroke Prevention Some health problems and behaviors may make it more likely for you to have a stroke. Below are ways to lessen your risk of having a stroke.  Be active for at least 30 minutes on most or all days.  Do not smoke. Try not to be around others who smoke.  Do not drink too much alcohol. ? Do not have more than 2 drinks a day if you are a man. ? Do not have more than 1 drink a day if you are a woman and are not pregnant.  Eat healthy foods, such as fruits and vegetables. If you were put on a specific diet, follow the diet as told.  Keep your cholesterol levels under control through diet and medicines. Look for foods that are low in saturated fat, trans fat, cholesterol, and are high in fiber.  If you have diabetes, follow all diet plans and take your medicine as told.  Ask your doctor if you need treatment to lower your blood pressure. If you have high blood pressure (hypertension), follow all diet plans and take your medicine as told by your doctor.  If you are 18-39 years old, have your blood pressure checked every 3-5 years. If you are age 40 or older, have your blood pressure checked every year.  Keep a healthy weight. Eat foods that are low in calories, salt, saturated fat, trans fat, and cholesterol.  Do not take drugs.  Avoid birth control pills, if this applies. Talk to your doctor about the risks of taking birth control pills.  Talk to your doctor if you have sleep problems (sleep apnea).  Take all medicine as told by your doctor. ? You may be told to take aspirin or blood thinner medicine. Take this medicine as told by your doctor. ? Understand your medicine instructions.  Make sure any other conditions you have are being taken care of.  Get help right away if:  You suddenly lose feeling (you feel numb) or have weakness in your face, arm, or leg.  Your face or eyelid hangs down to one side.  You suddenly feel confused.  You have trouble talking  (aphasia) or understanding what people are saying.  You suddenly have trouble seeing in one or both eyes.  You suddenly have trouble walking.  You are dizzy.  You lose your balance or your movements are clumsy (uncoordinated).  You suddenly have a very bad headache and you do not know the cause.  You have new chest pain.  Your heart feels like it is fluttering or skipping a beat (irregular heartbeat). Do not wait to see if the symptoms above go away. Get help right away. Call your local emergency services (911 in [place] Do not drive yourself to the hospital. This information is not intended to replace advice given to you by your health care provider. Make sure you discuss any questions you have with your health care provider. Document Released: 01/13/2012 Document Revised: 12/20/2015 Document Reviewed: 01/14/2013 Elsevier Interactive Patient Education  2018 Elsevier Inc.     Peripheral Vascular Disease Peripheral vascular disease (PVD) is a disease of the blood vessels that are not part of your heart and brain. A simple term for PVD is poor circulation. In most cases, PVD narrows the blood vessels that carry blood from your heart to the rest of your body. This can result in a decreased supply of blood to your arms, legs, and internal organs, like your stomach or kidneys. However, it most often affects   a person's lower legs and feet. There are two types of PVD.  Organic PVD. This is the more common type. It is caused by damage to the structure of blood vessels.  Functional PVD. This is caused by conditions that make blood vessels contract and tighten (spasm).  Without treatment, PVD tends to get worse over time. PVD can also lead to acute ischemic limb. This is when an arm or limb suddenly has trouble getting enough blood. This is a medical emergency. Follow these instructions at home:  Take medicines only as told by your doctor.  Do not use any tobacco products, including  cigarettes, chewing tobacco, or electronic cigarettes. If you need help quitting, ask your doctor.  Lose weight if you are overweight, and maintain a healthy weight as told by your doctor.  Eat a diet that is low in fat and cholesterol. If you need help, ask your doctor.  Exercise regularly. Ask your doctor for some good activities for you.  Take good care of your feet. ? Wear comfortable shoes that fit well. ? Check your feet often for any cuts or sores. Contact a doctor if:  You have cramps in your legs while walking.  You have leg pain when you are at rest.  You have coldness in a leg or foot.  Your skin changes.  You are unable to get or have an erection (erectile dysfunction).  You have cuts or sores on your feet that are not healing. Get help right away if:  Your arm or leg turns cold and blue.  Your arms or legs become red, warm, swollen, painful, or numb.  You have chest pain or trouble breathing.  You suddenly have weakness in your face, arm, or leg.  You become very confused or you cannot speak.  You suddenly have a very bad headache.  You suddenly cannot see. This information is not intended to replace advice given to you by your health care provider. Make sure you discuss any questions you have with your health care provider. Document Released: 10/08/2009 Document Revised: 12/20/2015 Document Reviewed: 12/22/2013 Elsevier Interactive Patient Education  2017 Elsevier Inc.    Peripheral Edema Peripheral edema is swelling that is caused by a buildup of fluid. Peripheral edema most often affects the lower legs, ankles, and feet. It can also develop in the arms, hands, and face. The area of the body that has peripheral edema will look swollen. It may also feel heavy or warm. Your clothes may start to feel tight. Pressing on the area may make a temporary dent in your skin. You may not be able to move your arm or leg as much as usual. There are many causes of  peripheral edema. It can be a complication of other diseases, such as congestive heart failure, kidney disease, or a problem with your blood circulation. It also can be a side effect of certain medicines. It often happens to women during pregnancy. Sometimes, the cause is not known. Treating the underlying condition is often the only treatment for peripheral edema. Follow these instructions at home: Pay attention to any changes in your symptoms. Take these actions to help with your discomfort:  Raise (elevate) your legs while you are sitting or lying down.  Move around often to prevent stiffness and to lessen swelling. Do not sit or stand for long periods of time.  Wear support stockings as told by your health care provider.  Follow instructions from your health care provider about limiting salt (sodium) in  your diet. Sometimes eating less salt can reduce swelling.  Take over-the-counter and prescription medicines only as told by your health care provider. Your health care provider may prescribe medicine to help your body get rid of excess water (diuretic).  Keep all follow-up visits as told by your health care provider. This is important.  Contact a health care provider if:  You have a fever.  Your edema starts suddenly or is getting worse, especially if you are pregnant or have a medical condition.  You have swelling in only one leg.  You have increased swelling and pain in your legs. Get help right away if:  You develop shortness of breath, especially when you are lying down.  You have pain in your chest or abdomen.  You feel weak.  You faint. This information is not intended to replace advice given to you by your health care provider. Make sure you discuss any questions you have with your health care provider. Document Released: 08/21/2004 Document Revised: 12/17/2015 Document Reviewed: 01/24/2015 Elsevier Interactive Patient Education  2018 [employer].   To decrease  swelling in your feet and legs: Elevate feet above slightly bent knees, feet above heart, overnight and 3-4 times per day for 20 minutes.

## 2017-08-06 NOTE — Progress Notes (Signed)
[facility] [place] HISTORY AND PHYSICAL    CC: Follow up extracranial carotid artery stenosis and PAOD  History of Present Illness:   Cynthia Ray is a 84 y.o. female who previously underwent right femoral to popliteal artery bypass and amputation of the distal tip of right first toe in August 2013 by Dr. Oneida Alar. 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 The patient denies any nonhealing wounds or rest pain. She ambulates with the assistance of a cane sometimes. She is able to go to the grocery store and accomplish daily tasks without issue.  She is also been followed for carotid stenosis. She denies amaurosis fugax, sudden onset weakness or numbness in extremities and slurred speech.  Her past medical history includes hyperlipidemia, hypertension and diabetes which have all been stable.She is unable to take aspirin. She is on a statin. She is a former smoker.   Pt was last evaluated on 07-31-16 by Dr. Oneida Alar and K. Trinh PA-C. At that time the right lower extremity bypass graft was patent. She denied any issues with her legs bilaterally. Will follow-up in 1 year with repeat duplex and ABIs.  The patient denies any TIA or stroke symptoms. Her carotid stenosis has been stable since her duplex last year. Follow-up in 1 year with repeat carotid duplex.   Daughter states pt sits in her recliner and does not elevate her legs, and eats too much salt.    Diabetic: Yes, she does not know her A1C Tobacco use: former smoker, quit in 1989, started in 1965  Pt meds include: Statin :Yes Betablocker: Yes ASA: Yes Other anticoagulants/antiplatelets: Plavix   Current Outpatient Medications  Medication Sig Dispense Refill  . amLODipine (NORVASC) 10 MG tablet TAKE ONE TABLET BY MOUTH DAILY. 90 tablet 3  . atorvastatin (LIPITOR) 80 MG tablet Take 80 mg by mouth  daily.    . calcium-vitamin D (OSCAL WITH D) 500-200 MG-UNIT per tablet Take 1 tablet by mouth 2 (two) times daily.    . cholecalciferol (VITAMIN D) 1000 UNITS tablet Take 1,000 Units by mouth daily.    . clopidogrel (PLAVIX) 75 MG tablet TAKE ONE TABLET BY MOUTH DAILY. 90 tablet 3  . ferrous sulfate 325 (65 FE) MG tablet Take 325 mg by mouth 2 (two) times daily.    Marland Kitchen glipiZIDE (GLUCOTROL) 5 MG tablet Take 5 mg by mouth 2 (two) times daily before a meal.    . lisinopril (PRINIVIL,ZESTRIL) 40 MG tablet Take 1 tablet (40 mg total) by mouth daily. 30 tablet 6  . magnesium oxide (MAG-OX) 400 MG tablet Take 400 mg by mouth daily.    . metFORMIN (GLUCOPHAGE) 500 MG tablet Take 500 mg by mouth 3 (three) times daily.    . metoprolol tartrate (LOPRESSOR) 25 MG tablet Take 1 tablet (25 mg total) by mouth 2 (two) times daily. 180 tablet 3  . nitroGLYCERIN (NITROSTAT) 0.4 MG SL tablet Place 0.4 mg under the tongue every 5 (five) minutes as needed for chest pain.    . Omega-3 Fatty Acids (FISH OIL) 1000 MG CAPS Take 1 capsule by mouth daily.    Marland Kitchen omeprazole (PRILOSEC) 40 MG capsule Take 40 mg by mouth as needed.    . traMADol (ULTRAM) 50 MG tablet Take 50 mg by mouth 2 (two) times daily.      No current facility-administered medications for this visit.  Past Medical History:  Diagnosis Date  . Anemia   . Arthritis    "dr said I have it in my back" [place]   . Carotid artery occlusion   . COPD (chronic obstructive pulmonary disease) ([place])   . GERD (gastroesophageal reflux disease)   . History of blood transfusion 11/11/11  . Hyperlipidemia   . Hypertension   . PVD (peripheral vascular disease) ([place])    Stents left SFA 2004 Dr. Albertine Patricia  . Type II diabetes mellitus ([place])     Social History Social History   Tobacco Use  . Smoking status: Former Smoker    Packs/day: 1.00    Years: 24.00    Pack years: 24.00    Types: Cigarettes    Start date: 07/29/1963    Last attempt to quit: 07/29/1987     Years since quitting: 30.0  . Smokeless tobacco: Never Used  Substance Use Topics  . Alcohol use: No    Alcohol/week: 0.0 oz  . Drug use: No    Family History Family History  Problem Relation Age of Onset  . Stroke Father 78  . Hypertension Father   . Stroke Mother 68  . Diabetes Mother   . Hypertension Mother   . Diabetes Sister   . Hyperlipidemia Sister   . Hypertension Sister   . Diabetes Brother   . Hypertension Brother     Surgical History Past Surgical History:  Procedure Laterality Date  . ABDOMINAL AORTAGRAM N/A 10/31/2011   Procedure: ABDOMINAL Maxcine Ham;  Surgeon: Elam Dutch, MD;  Location: [facility] CATH LAB;  Service: Cardiovascular;  Laterality: N/A;  . AMPUTATION  03/23/2012   Procedure: AMPUTATION DIGIT;  Surgeon: Elam Dutch, MD;  Location: [facility] OR;  Service: Vascular;  Laterality: Right;  Right Femoral endarterectomy with profundaplasty; right femoral-popliteal bypass with nonreversed saphenous vein; and right first toe amputation  . CATARACT EXTRACTION W/ INTRAOCULAR LENS  IMPLANT, BILATERAL  2002  . COLONOSCOPY    . CORONARY ANGIOPLASTY WITH STENT PLACEMENT  01/19/12   "1; first one I've ever had"  . EYE SURGERY  2002   Cataract  . EYE SURGERY  2009   Laser bilateral   . FEMORAL ENDARTERECTOMY  03/23/2012   right  . FEMORAL-POPLITEAL BYPASS GRAFT  03/23/2012   Procedure: BYPASS GRAFT FEMORAL-POPLITEAL ARTERY;  Surgeon: Elam Dutch, MD;  Location: [facility] OR;  Service: Vascular;  Laterality: Right;  Right Femoral endarterectomy with profundaplasty; right femoral-popliteal bypass with nonreversed saphenous vein; and right first toe amputation  . PERCUTANEOUS CORONARY STENT INTERVENTION (PCI-S) N/A 01/19/2012   Procedure: PERCUTANEOUS CORONARY STENT INTERVENTION (PCI-S);  Surgeon: Peter M Martinique, MD;  Location: [facility] CATH LAB;  Service: Cardiovascular;  Laterality: N/A;  . PERIPHERAL ARTERIAL STENT GRAFT  2003   LLE  . POSTERIOR LAMINECTOMY / DECOMPRESSION  LUMBAR SPINE  1989  . REFRACTIVE SURGERY  2009   bilaterally  . TONSILLECTOMY AND ADENOIDECTOMY  1946    Allergies  Allergen Reactions  . Aspirin Other (See Comments)    Dr. Britta Mccreedy informed patient not to take this due to ulcer  . Ibuprofen Other (See Comments)    Dr. Britta Mccreedy informed patient not to take this due to ulcer     Current Outpatient Medications  Medication Sig Dispense Refill  . amLODipine (NORVASC) 10 MG tablet TAKE ONE TABLET BY MOUTH DAILY. 90 tablet 3  . atorvastatin (LIPITOR) 80 MG tablet Take 80 mg by mouth daily.    . calcium-vitamin D (OSCAL WITH D)  500-200 MG-UNIT per tablet Take 1 tablet by mouth 2 (two) times daily.    . cholecalciferol (VITAMIN D) 1000 UNITS tablet Take 1,000 Units by mouth daily.    . clopidogrel (PLAVIX) 75 MG tablet TAKE ONE TABLET BY MOUTH DAILY. 90 tablet 3  . ferrous sulfate 325 (65 FE) MG tablet Take 325 mg by mouth 2 (two) times daily.    Marland Kitchen glipiZIDE (GLUCOTROL) 5 MG tablet Take 5 mg by mouth 2 (two) times daily before a meal.    . lisinopril (PRINIVIL,ZESTRIL) 40 MG tablet Take 1 tablet (40 mg total) by mouth daily. 30 tablet 6  . magnesium oxide (MAG-OX) 400 MG tablet Take 400 mg by mouth daily.    . metFORMIN (GLUCOPHAGE) 500 MG tablet Take 500 mg by mouth 3 (three) times daily.    . metoprolol tartrate (LOPRESSOR) 25 MG tablet Take 1 tablet (25 mg total) by mouth 2 (two) times daily. 180 tablet 3  . nitroGLYCERIN (NITROSTAT) 0.4 MG SL tablet Place 0.4 mg under the tongue every 5 (five) minutes as needed for chest pain.    . Omega-3 Fatty Acids (FISH OIL) 1000 MG CAPS Take 1 capsule by mouth daily.    Marland Kitchen omeprazole (PRILOSEC) 40 MG capsule Take 40 mg by mouth as needed.    . traMADol (ULTRAM) 50 MG tablet Take 50 mg by mouth 2 (two) times daily.      No current facility-administered medications for this visit.      REVIEW OF SYSTEMS: See HPI for pertinent positives and negatives.  Physical Examination Vitals:   08/06/17 1239  08/06/17 1241  BP: (!) 164/78 (!) 153/71  Pulse: 82   Resp: 18   Temp: 98.1 F (36.7 C)   TempSrc: Oral   SpO2: 96%   Weight: 132 lb (59.9 kg)   Height: 5' (1.524 m)    Body mass index is 25.78 kg/m.  General:  WDWN in female NAD Gait: using walker, slow, deliberate HENT: No gross abnormalities Eyes: PERRLA Pulmonary: normal non-labored breathing, good air movement, CTAB, no rales, rhonchi, or wheezing Cardiac: RRR, no murmur detected  Abdomen: soft, NT, no masses palpated Skin: no rashes, no ulcers, no cellulitis.   VASCULAR EXAM  Carotid Bruits Right Left   Negative Negative      Radial pulses are 2+ palpable bilaterally   Adominal aortic pulse is not palpable                      VASCULAR EXAM: Extremities without ischemic changes, without Gangrene; without open wounds. 2-3+ pitting edema in both feet and lower legs.  Small raised mass at right medial mid/distal thigh that measures about 3.5 x 4 cm. Surgically absent distal tip of right great toe.                                                                                                           LE Pulses Right Left       FEMORAL  1+ palpable  1+ palpable  POPLITEAL  not palpable   not palpable       POSTERIOR TIBIAL  not palpable   not palpable        DORSALIS PEDIS      ANTERIOR TIBIAL not palpable  not palpable      Musculoskeletal: no muscle wasting or atrophy. Moderate thoracic kyphosis.   Neurologic:  A&O X 3; appropriate affect, sensation is normal; speech is normal, CN 2-12 intact, pain and light touch intact in extremities, motor exam as listed above.  Psychiatric: Normal mood and affect for clinical situation.      ASSESSMENT:  Cynthia Ray is a 84 y.o. female who is s/p right femoral to popliteal artery bypass and amputation of the distal tip of right first toe in August 2013 by Dr. Oneida Alar. She also has a left superficial femoral artery stent and chronic occlusion of the  distal superficial femoral artery and popliteal artery based on angiography. She has no claudication symptoms with walking, no signs of ischemia in her feet or legs.   Pt states the lump noted on duplex at her right medial thigh, and noted on exam, has been there since the right leg arterial bypass graft.   She has no hx of stroke or TIA.  Dependent edema in both lower legs and feet since she sits most of the day with her feet dependent; see Patient Instructions for elevation of legs. Marland Kitchen   DATA  Carotid Duplex (08-06-17): 40-59% right internal carotid artery stenosis 1-39% left proximal internal carotid artery stenosis Bilateral vertebral artery flow is antegrade.  Bilateral subclavian artery waveforms are normal.  No significant change compared to the exam on 07-31-16.   Right LE Arterial Duplex (08/06/17): A cursory look was given to the lower extremity venous system: no DVT. Cystic structure with no internal flow in the right medial mid/distal thigh region measuring approximately 3.3 x 3.9 cm. Patent right LE bypass graft with no restenosis, all biphasic waveforms.  Left proximal SFA stenosis at 50-99% with hx of stent placed, stent is poorly visualized due to calcified plaque.  No significant change compared to exam on 07-31-16.    ABI (Date: 08/06/2017):  R:   ABI: [place] (was [place] 07-31-16),   PT: waveform morphology not documented  DP: waveform morphology not documented  TBI:  Right great toe tip surgically absent  L:   ABI: [place] (was [place]),   PT: waveform morphology not documented  DP: waveform morphology not documented  TBI: 0.44 ABIs are unobtainable due to noncompressible vessels.     PLAN:  Elevation of legs, daily seated leg exercises as discussed and demonstrated.  Based on today's exam and non-invasive vascular lab results, the patient will follow up in 1 year with the following tests: carotid duplex, right LE arterial duplex, and ABI's. I discussed in depth with the  patient the nature of atherosclerosis, and emphasized the importance of maximal medical management including strict control of blood pressure, blood glucose, and lipid levels, obtaining regular exercise, and cessation of smoking.  The patient is aware that without maximal medical management the underlying atherosclerotic disease process will progress, limiting the benefit of any interventions.  The patient was given information about stroke prevention and what symptoms should prompt the patient to seek immediate medical care.  The patient was given information about PAD including signs, symptoms, treatment, what symptoms should prompt the patient to seek immediate medical care, and risk reduction measures to take.  Thank you for allowing [place] to participate  in this patient's care.  Clemon Chambers, RN, MSN, FNP-C [facility] Office: [phone]  Clinic MD: [facility] 08/06/2017 12:44 PM

## 2017-08-11 DIAGNOSIS — E1140 Type 2 diabetes mellitus with diabetic neuropathy, unspecified: Secondary | ICD-10-CM | POA: Diagnosis not present

## 2017-08-11 DIAGNOSIS — E1151 Type 2 diabetes mellitus with diabetic peripheral angiopathy without gangrene: Secondary | ICD-10-CM | POA: Diagnosis not present

## 2017-09-08 DIAGNOSIS — K210 Gastro-esophageal reflux disease with esophagitis: Secondary | ICD-10-CM | POA: Diagnosis not present

## 2017-09-08 DIAGNOSIS — E119 Type 2 diabetes mellitus without complications: Secondary | ICD-10-CM | POA: Diagnosis not present

## 2017-09-08 DIAGNOSIS — I872 Venous insufficiency (chronic) (peripheral): Secondary | ICD-10-CM | POA: Diagnosis not present

## 2017-09-08 DIAGNOSIS — I10 Essential (primary) hypertension: Secondary | ICD-10-CM | POA: Diagnosis not present

## 2017-09-08 DIAGNOSIS — E7849 Other hyperlipidemia: Secondary | ICD-10-CM | POA: Diagnosis not present

## 2017-09-08 DIAGNOSIS — M545 Low back pain: Secondary | ICD-10-CM | POA: Diagnosis not present

## 2017-10-05 DIAGNOSIS — H524 Presbyopia: Secondary | ICD-10-CM | POA: Diagnosis not present

## 2017-10-05 DIAGNOSIS — E119 Type 2 diabetes mellitus without complications: Secondary | ICD-10-CM | POA: Diagnosis not present

## 2017-10-05 DIAGNOSIS — Z961 Presence of intraocular lens: Secondary | ICD-10-CM | POA: Diagnosis not present

## 2017-10-05 DIAGNOSIS — Z7984 Long term (current) use of oral hypoglycemic drugs: Secondary | ICD-10-CM | POA: Diagnosis not present

## 2017-10-27 DIAGNOSIS — E1151 Type 2 diabetes mellitus with diabetic peripheral angiopathy without gangrene: Secondary | ICD-10-CM | POA: Diagnosis not present

## 2017-10-27 DIAGNOSIS — E1140 Type 2 diabetes mellitus with diabetic neuropathy, unspecified: Secondary | ICD-10-CM | POA: Diagnosis not present

## 2017-11-25 HISTORY — PX: HIP FRACTURE SURGERY: SHX118

## 2017-11-29 DIAGNOSIS — M24159 Other articular cartilage disorders, unspecified hip: Secondary | ICD-10-CM | POA: Diagnosis not present

## 2017-11-29 DIAGNOSIS — M47816 Spondylosis without myelopathy or radiculopathy, lumbar region: Secondary | ICD-10-CM | POA: Diagnosis not present

## 2017-11-29 DIAGNOSIS — M160 Bilateral primary osteoarthritis of hip: Secondary | ICD-10-CM | POA: Diagnosis not present

## 2017-11-29 DIAGNOSIS — Z471 Aftercare following joint replacement surgery: Secondary | ICD-10-CM | POA: Diagnosis not present

## 2017-11-29 DIAGNOSIS — Z7902 Long term (current) use of antithrombotics/antiplatelets: Secondary | ICD-10-CM | POA: Diagnosis not present

## 2017-11-29 DIAGNOSIS — R279 Unspecified lack of coordination: Secondary | ICD-10-CM | POA: Diagnosis not present

## 2017-11-29 DIAGNOSIS — R000 Tachycardia, unspecified: Secondary | ICD-10-CM | POA: Diagnosis not present

## 2017-11-29 DIAGNOSIS — S0990XA Unspecified injury of head, initial encounter: Secondary | ICD-10-CM | POA: Diagnosis not present

## 2017-11-29 DIAGNOSIS — T148XXA Other injury of unspecified body region, initial encounter: Secondary | ICD-10-CM | POA: Diagnosis not present

## 2017-11-29 DIAGNOSIS — M6281 Muscle weakness (generalized): Secondary | ICD-10-CM | POA: Diagnosis not present

## 2017-11-29 DIAGNOSIS — D509 Iron deficiency anemia, unspecified: Secondary | ICD-10-CM | POA: Diagnosis not present

## 2017-11-29 DIAGNOSIS — R600 Localized edema: Secondary | ICD-10-CM | POA: Diagnosis not present

## 2017-11-29 DIAGNOSIS — M533 Sacrococcygeal disorders, not elsewhere classified: Secondary | ICD-10-CM | POA: Diagnosis not present

## 2017-11-29 DIAGNOSIS — M11261 Other chondrocalcinosis, right knee: Secondary | ICD-10-CM | POA: Diagnosis not present

## 2017-11-29 DIAGNOSIS — W1839XA Other fall on same level, initial encounter: Secondary | ICD-10-CM | POA: Diagnosis not present

## 2017-11-29 DIAGNOSIS — I110 Hypertensive heart disease with heart failure: Secondary | ICD-10-CM | POA: Diagnosis present

## 2017-11-29 DIAGNOSIS — S72041A Displaced fracture of base of neck of right femur, initial encounter for closed fracture: Secondary | ICD-10-CM | POA: Diagnosis not present

## 2017-11-29 DIAGNOSIS — L03115 Cellulitis of right lower limb: Secondary | ICD-10-CM | POA: Diagnosis not present

## 2017-11-29 DIAGNOSIS — Z9181 History of falling: Secondary | ICD-10-CM | POA: Diagnosis not present

## 2017-11-29 DIAGNOSIS — E11649 Type 2 diabetes mellitus with hypoglycemia without coma: Secondary | ICD-10-CM | POA: Diagnosis not present

## 2017-11-29 DIAGNOSIS — E119 Type 2 diabetes mellitus without complications: Secondary | ICD-10-CM | POA: Diagnosis not present

## 2017-11-29 DIAGNOSIS — Z79899 Other long term (current) drug therapy: Secondary | ICD-10-CM | POA: Diagnosis not present

## 2017-11-29 DIAGNOSIS — I700 Atherosclerosis of aorta: Secondary | ICD-10-CM | POA: Diagnosis present

## 2017-11-29 DIAGNOSIS — I6529 Occlusion and stenosis of unspecified carotid artery: Secondary | ICD-10-CM | POA: Diagnosis not present

## 2017-11-29 DIAGNOSIS — T796XXA Traumatic ischemia of muscle, initial encounter: Secondary | ICD-10-CM | POA: Diagnosis not present

## 2017-11-29 DIAGNOSIS — S72001A Fracture of unspecified part of neck of right femur, initial encounter for closed fracture: Secondary | ICD-10-CM | POA: Diagnosis not present

## 2017-11-29 DIAGNOSIS — Z01818 Encounter for other preprocedural examination: Secondary | ICD-10-CM | POA: Diagnosis not present

## 2017-11-29 DIAGNOSIS — Z743 Need for continuous supervision: Secondary | ICD-10-CM | POA: Diagnosis not present

## 2017-11-29 DIAGNOSIS — Z96641 Presence of right artificial hip joint: Secondary | ICD-10-CM | POA: Diagnosis not present

## 2017-11-29 DIAGNOSIS — M8580 Other specified disorders of bone density and structure, unspecified site: Secondary | ICD-10-CM | POA: Diagnosis not present

## 2017-11-29 DIAGNOSIS — M1711 Unilateral primary osteoarthritis, right knee: Secondary | ICD-10-CM | POA: Diagnosis not present

## 2017-11-29 DIAGNOSIS — M7989 Other specified soft tissue disorders: Secondary | ICD-10-CM | POA: Diagnosis not present

## 2017-11-29 DIAGNOSIS — S72009A Fracture of unspecified part of neck of unspecified femur, initial encounter for closed fracture: Secondary | ICD-10-CM | POA: Diagnosis not present

## 2017-11-29 DIAGNOSIS — S199XXA Unspecified injury of neck, initial encounter: Secondary | ICD-10-CM | POA: Diagnosis not present

## 2017-11-29 DIAGNOSIS — S72031A Displaced midcervical fracture of right femur, initial encounter for closed fracture: Secondary | ICD-10-CM | POA: Diagnosis not present

## 2017-11-29 DIAGNOSIS — E872 Acidosis: Secondary | ICD-10-CM | POA: Diagnosis not present

## 2017-11-29 DIAGNOSIS — E785 Hyperlipidemia, unspecified: Secondary | ICD-10-CM | POA: Diagnosis not present

## 2017-11-29 DIAGNOSIS — Z87891 Personal history of nicotine dependence: Secondary | ICD-10-CM | POA: Diagnosis not present

## 2017-11-29 DIAGNOSIS — Y998 Other external cause status: Secondary | ICD-10-CM | POA: Diagnosis not present

## 2017-11-29 DIAGNOSIS — K649 Unspecified hemorrhoids: Secondary | ICD-10-CM | POA: Diagnosis present

## 2017-11-29 DIAGNOSIS — R0902 Hypoxemia: Secondary | ICD-10-CM | POA: Diagnosis not present

## 2017-11-29 DIAGNOSIS — I10 Essential (primary) hypertension: Secondary | ICD-10-CM | POA: Diagnosis not present

## 2017-11-29 DIAGNOSIS — Z955 Presence of coronary angioplasty implant and graft: Secondary | ICD-10-CM | POA: Diagnosis not present

## 2017-11-29 DIAGNOSIS — I2510 Atherosclerotic heart disease of native coronary artery without angina pectoris: Secondary | ICD-10-CM | POA: Diagnosis not present

## 2017-11-29 DIAGNOSIS — Z7984 Long term (current) use of oral hypoglycemic drugs: Secondary | ICD-10-CM | POA: Diagnosis not present

## 2017-11-29 DIAGNOSIS — E7800 Pure hypercholesterolemia, unspecified: Secondary | ICD-10-CM | POA: Diagnosis not present

## 2017-11-29 DIAGNOSIS — M1611 Unilateral primary osteoarthritis, right hip: Secondary | ICD-10-CM | POA: Diagnosis not present

## 2017-11-29 DIAGNOSIS — S8011XA Contusion of right lower leg, initial encounter: Secondary | ICD-10-CM | POA: Diagnosis not present

## 2017-11-29 DIAGNOSIS — R9431 Abnormal electrocardiogram [ECG] [EKG]: Secondary | ICD-10-CM | POA: Diagnosis not present

## 2017-11-29 DIAGNOSIS — I517 Cardiomegaly: Secondary | ICD-10-CM | POA: Diagnosis not present

## 2017-11-29 DIAGNOSIS — R269 Unspecified abnormalities of gait and mobility: Secondary | ICD-10-CM | POA: Diagnosis not present

## 2017-11-29 DIAGNOSIS — I5030 Unspecified diastolic (congestive) heart failure: Secondary | ICD-10-CM | POA: Diagnosis not present

## 2017-11-29 DIAGNOSIS — M25551 Pain in right hip: Secondary | ICD-10-CM | POA: Diagnosis not present

## 2017-11-29 DIAGNOSIS — I998 Other disorder of circulatory system: Secondary | ICD-10-CM | POA: Diagnosis not present

## 2017-11-29 DIAGNOSIS — R2689 Other abnormalities of gait and mobility: Secondary | ICD-10-CM | POA: Diagnosis not present

## 2017-11-29 DIAGNOSIS — S72001D Fracture of unspecified part of neck of right femur, subsequent encounter for closed fracture with routine healing: Secondary | ICD-10-CM | POA: Diagnosis not present

## 2017-11-29 DIAGNOSIS — I739 Peripheral vascular disease, unspecified: Secondary | ICD-10-CM | POA: Diagnosis not present

## 2017-11-29 DIAGNOSIS — R32 Unspecified urinary incontinence: Secondary | ICD-10-CM | POA: Diagnosis present

## 2017-11-29 DIAGNOSIS — R918 Other nonspecific abnormal finding of lung field: Secondary | ICD-10-CM | POA: Diagnosis not present

## 2017-11-29 DIAGNOSIS — I5032 Chronic diastolic (congestive) heart failure: Secondary | ICD-10-CM | POA: Diagnosis present

## 2017-11-29 DIAGNOSIS — R51 Headache: Secondary | ICD-10-CM | POA: Diagnosis not present

## 2017-11-29 DIAGNOSIS — S72091A Other fracture of head and neck of right femur, initial encounter for closed fracture: Secondary | ICD-10-CM | POA: Diagnosis not present

## 2017-11-29 DIAGNOSIS — E1165 Type 2 diabetes mellitus with hyperglycemia: Secondary | ICD-10-CM | POA: Diagnosis not present

## 2017-11-29 DIAGNOSIS — Z794 Long term (current) use of insulin: Secondary | ICD-10-CM | POA: Diagnosis not present

## 2017-11-29 DIAGNOSIS — S299XXA Unspecified injury of thorax, initial encounter: Secondary | ICD-10-CM | POA: Diagnosis not present

## 2017-11-29 DIAGNOSIS — W19XXXA Unspecified fall, initial encounter: Secondary | ICD-10-CM | POA: Diagnosis not present

## 2017-11-29 DIAGNOSIS — M545 Low back pain: Secondary | ICD-10-CM | POA: Diagnosis not present

## 2017-11-29 DIAGNOSIS — M1612 Unilateral primary osteoarthritis, left hip: Secondary | ICD-10-CM | POA: Diagnosis not present

## 2017-12-03 DIAGNOSIS — Z7902 Long term (current) use of antithrombotics/antiplatelets: Secondary | ICD-10-CM | POA: Diagnosis not present

## 2017-12-03 DIAGNOSIS — Z79899 Other long term (current) drug therapy: Secondary | ICD-10-CM | POA: Diagnosis not present

## 2017-12-03 DIAGNOSIS — J168 Pneumonia due to other specified infectious organisms: Secondary | ICD-10-CM | POA: Diagnosis not present

## 2017-12-03 DIAGNOSIS — J189 Pneumonia, unspecified organism: Secondary | ICD-10-CM | POA: Diagnosis present

## 2017-12-03 DIAGNOSIS — Z471 Aftercare following joint replacement surgery: Secondary | ICD-10-CM | POA: Diagnosis not present

## 2017-12-03 DIAGNOSIS — I10 Essential (primary) hypertension: Secondary | ICD-10-CM | POA: Diagnosis not present

## 2017-12-03 DIAGNOSIS — Z9181 History of falling: Secondary | ICD-10-CM | POA: Diagnosis not present

## 2017-12-03 DIAGNOSIS — Z87891 Personal history of nicotine dependence: Secondary | ICD-10-CM | POA: Diagnosis not present

## 2017-12-03 DIAGNOSIS — Z96641 Presence of right artificial hip joint: Secondary | ICD-10-CM | POA: Diagnosis not present

## 2017-12-03 DIAGNOSIS — M7989 Other specified soft tissue disorders: Secondary | ICD-10-CM | POA: Diagnosis present

## 2017-12-03 DIAGNOSIS — M6281 Muscle weakness (generalized): Secondary | ICD-10-CM | POA: Diagnosis not present

## 2017-12-03 DIAGNOSIS — R0902 Hypoxemia: Secondary | ICD-10-CM | POA: Diagnosis not present

## 2017-12-03 DIAGNOSIS — Z743 Need for continuous supervision: Secondary | ICD-10-CM | POA: Diagnosis not present

## 2017-12-03 DIAGNOSIS — F980 Enuresis not due to a substance or known physiological condition: Secondary | ICD-10-CM | POA: Diagnosis not present

## 2017-12-03 DIAGNOSIS — I5041 Acute combined systolic (congestive) and diastolic (congestive) heart failure: Secondary | ICD-10-CM | POA: Diagnosis not present

## 2017-12-03 DIAGNOSIS — D6489 Other specified anemias: Secondary | ICD-10-CM | POA: Diagnosis not present

## 2017-12-03 DIAGNOSIS — D649 Anemia, unspecified: Secondary | ICD-10-CM | POA: Diagnosis present

## 2017-12-03 DIAGNOSIS — Z886 Allergy status to analgesic agent status: Secondary | ICD-10-CM | POA: Diagnosis not present

## 2017-12-03 DIAGNOSIS — I739 Peripheral vascular disease, unspecified: Secondary | ICD-10-CM | POA: Diagnosis present

## 2017-12-03 DIAGNOSIS — J811 Chronic pulmonary edema: Secondary | ICD-10-CM | POA: Diagnosis not present

## 2017-12-03 DIAGNOSIS — I7389 Other specified peripheral vascular diseases: Secondary | ICD-10-CM | POA: Diagnosis not present

## 2017-12-03 DIAGNOSIS — I110 Hypertensive heart disease with heart failure: Secondary | ICD-10-CM | POA: Diagnosis not present

## 2017-12-03 DIAGNOSIS — I2510 Atherosclerotic heart disease of native coronary artery without angina pectoris: Secondary | ICD-10-CM | POA: Diagnosis not present

## 2017-12-03 DIAGNOSIS — R279 Unspecified lack of coordination: Secondary | ICD-10-CM | POA: Diagnosis not present

## 2017-12-03 DIAGNOSIS — R269 Unspecified abnormalities of gait and mobility: Secondary | ICD-10-CM | POA: Diagnosis not present

## 2017-12-03 DIAGNOSIS — Z7984 Long term (current) use of oral hypoglycemic drugs: Secondary | ICD-10-CM | POA: Diagnosis not present

## 2017-12-03 DIAGNOSIS — Z794 Long term (current) use of insulin: Secondary | ICD-10-CM | POA: Diagnosis not present

## 2017-12-03 DIAGNOSIS — I5030 Unspecified diastolic (congestive) heart failure: Secondary | ICD-10-CM | POA: Diagnosis not present

## 2017-12-03 DIAGNOSIS — R0600 Dyspnea, unspecified: Secondary | ICD-10-CM | POA: Diagnosis not present

## 2017-12-03 DIAGNOSIS — I509 Heart failure, unspecified: Secondary | ICD-10-CM | POA: Diagnosis not present

## 2017-12-03 DIAGNOSIS — Y95 Nosocomial condition: Secondary | ICD-10-CM | POA: Diagnosis not present

## 2017-12-03 DIAGNOSIS — E119 Type 2 diabetes mellitus without complications: Secondary | ICD-10-CM | POA: Diagnosis present

## 2017-12-03 DIAGNOSIS — R2689 Other abnormalities of gait and mobility: Secondary | ICD-10-CM | POA: Diagnosis not present

## 2017-12-03 DIAGNOSIS — S72001A Fracture of unspecified part of neck of right femur, initial encounter for closed fracture: Secondary | ICD-10-CM | POA: Diagnosis not present

## 2017-12-03 DIAGNOSIS — S72001D Fracture of unspecified part of neck of right femur, subsequent encounter for closed fracture with routine healing: Secondary | ICD-10-CM | POA: Diagnosis not present

## 2017-12-03 DIAGNOSIS — E785 Hyperlipidemia, unspecified: Secondary | ICD-10-CM | POA: Diagnosis present

## 2017-12-03 DIAGNOSIS — T796XXA Traumatic ischemia of muscle, initial encounter: Secondary | ICD-10-CM | POA: Diagnosis not present

## 2017-12-04 DIAGNOSIS — F980 Enuresis not due to a substance or known physiological condition: Secondary | ICD-10-CM | POA: Diagnosis not present

## 2017-12-04 DIAGNOSIS — Z96641 Presence of right artificial hip joint: Secondary | ICD-10-CM | POA: Diagnosis not present

## 2017-12-04 DIAGNOSIS — I7389 Other specified peripheral vascular diseases: Secondary | ICD-10-CM | POA: Diagnosis not present

## 2017-12-12 DIAGNOSIS — D649 Anemia, unspecified: Secondary | ICD-10-CM | POA: Diagnosis not present

## 2017-12-12 DIAGNOSIS — J189 Pneumonia, unspecified organism: Secondary | ICD-10-CM | POA: Diagnosis not present

## 2017-12-12 DIAGNOSIS — J811 Chronic pulmonary edema: Secondary | ICD-10-CM | POA: Diagnosis not present

## 2017-12-12 DIAGNOSIS — M7989 Other specified soft tissue disorders: Secondary | ICD-10-CM | POA: Diagnosis present

## 2017-12-12 DIAGNOSIS — R2689 Other abnormalities of gait and mobility: Secondary | ICD-10-CM | POA: Diagnosis not present

## 2017-12-12 DIAGNOSIS — J168 Pneumonia due to other specified infectious organisms: Secondary | ICD-10-CM | POA: Diagnosis not present

## 2017-12-12 DIAGNOSIS — I5030 Unspecified diastolic (congestive) heart failure: Secondary | ICD-10-CM | POA: Diagnosis not present

## 2017-12-12 DIAGNOSIS — E785 Hyperlipidemia, unspecified: Secondary | ICD-10-CM | POA: Diagnosis present

## 2017-12-12 DIAGNOSIS — Z471 Aftercare following joint replacement surgery: Secondary | ICD-10-CM | POA: Diagnosis not present

## 2017-12-12 DIAGNOSIS — Z79899 Other long term (current) drug therapy: Secondary | ICD-10-CM | POA: Diagnosis not present

## 2017-12-12 DIAGNOSIS — I5041 Acute combined systolic (congestive) and diastolic (congestive) heart failure: Secondary | ICD-10-CM | POA: Diagnosis not present

## 2017-12-12 DIAGNOSIS — Z87891 Personal history of nicotine dependence: Secondary | ICD-10-CM | POA: Diagnosis not present

## 2017-12-12 DIAGNOSIS — Z7902 Long term (current) use of antithrombotics/antiplatelets: Secondary | ICD-10-CM | POA: Diagnosis not present

## 2017-12-12 DIAGNOSIS — Z7401 Bed confinement status: Secondary | ICD-10-CM | POA: Diagnosis not present

## 2017-12-12 DIAGNOSIS — Z9181 History of falling: Secondary | ICD-10-CM | POA: Diagnosis not present

## 2017-12-12 DIAGNOSIS — Z7984 Long term (current) use of oral hypoglycemic drugs: Secondary | ICD-10-CM | POA: Diagnosis not present

## 2017-12-12 DIAGNOSIS — R600 Localized edema: Secondary | ICD-10-CM | POA: Diagnosis not present

## 2017-12-12 DIAGNOSIS — S72001D Fracture of unspecified part of neck of right femur, subsequent encounter for closed fracture with routine healing: Secondary | ICD-10-CM | POA: Diagnosis not present

## 2017-12-12 DIAGNOSIS — Z96641 Presence of right artificial hip joint: Secondary | ICD-10-CM | POA: Diagnosis present

## 2017-12-12 DIAGNOSIS — I509 Heart failure, unspecified: Secondary | ICD-10-CM | POA: Diagnosis not present

## 2017-12-12 DIAGNOSIS — I110 Hypertensive heart disease with heart failure: Secondary | ICD-10-CM | POA: Diagnosis not present

## 2017-12-12 DIAGNOSIS — M6281 Muscle weakness (generalized): Secondary | ICD-10-CM | POA: Diagnosis not present

## 2017-12-12 DIAGNOSIS — E119 Type 2 diabetes mellitus without complications: Secondary | ICD-10-CM | POA: Diagnosis not present

## 2017-12-12 DIAGNOSIS — D6489 Other specified anemias: Secondary | ICD-10-CM | POA: Diagnosis not present

## 2017-12-12 DIAGNOSIS — Y95 Nosocomial condition: Secondary | ICD-10-CM | POA: Diagnosis not present

## 2017-12-12 DIAGNOSIS — R279 Unspecified lack of coordination: Secondary | ICD-10-CM | POA: Diagnosis not present

## 2017-12-12 DIAGNOSIS — R0600 Dyspnea, unspecified: Secondary | ICD-10-CM | POA: Diagnosis not present

## 2017-12-12 DIAGNOSIS — I739 Peripheral vascular disease, unspecified: Secondary | ICD-10-CM | POA: Diagnosis not present

## 2017-12-12 DIAGNOSIS — R0902 Hypoxemia: Secondary | ICD-10-CM | POA: Diagnosis not present

## 2017-12-12 DIAGNOSIS — Z886 Allergy status to analgesic agent status: Secondary | ICD-10-CM | POA: Diagnosis not present

## 2017-12-15 DIAGNOSIS — M79672 Pain in left foot: Secondary | ICD-10-CM | POA: Diagnosis not present

## 2017-12-15 DIAGNOSIS — I5041 Acute combined systolic (congestive) and diastolic (congestive) heart failure: Secondary | ICD-10-CM | POA: Diagnosis not present

## 2017-12-15 DIAGNOSIS — Z7401 Bed confinement status: Secondary | ICD-10-CM | POA: Diagnosis not present

## 2017-12-15 DIAGNOSIS — M6281 Muscle weakness (generalized): Secondary | ICD-10-CM | POA: Diagnosis not present

## 2017-12-15 DIAGNOSIS — L03032 Cellulitis of left toe: Secondary | ICD-10-CM | POA: Diagnosis not present

## 2017-12-15 DIAGNOSIS — J168 Pneumonia due to other specified infectious organisms: Secondary | ICD-10-CM | POA: Diagnosis not present

## 2017-12-15 DIAGNOSIS — I110 Hypertensive heart disease with heart failure: Secondary | ICD-10-CM | POA: Diagnosis not present

## 2017-12-15 DIAGNOSIS — E1122 Type 2 diabetes mellitus with diabetic chronic kidney disease: Secondary | ICD-10-CM | POA: Diagnosis not present

## 2017-12-15 DIAGNOSIS — L89893 Pressure ulcer of other site, stage 3: Secondary | ICD-10-CM | POA: Diagnosis not present

## 2017-12-15 DIAGNOSIS — Y95 Nosocomial condition: Secondary | ICD-10-CM | POA: Diagnosis not present

## 2017-12-15 DIAGNOSIS — I872 Venous insufficiency (chronic) (peripheral): Secondary | ICD-10-CM | POA: Diagnosis not present

## 2017-12-15 DIAGNOSIS — I5030 Unspecified diastolic (congestive) heart failure: Secondary | ICD-10-CM | POA: Diagnosis not present

## 2017-12-15 DIAGNOSIS — I10 Essential (primary) hypertension: Secondary | ICD-10-CM | POA: Diagnosis not present

## 2017-12-15 DIAGNOSIS — I509 Heart failure, unspecified: Secondary | ICD-10-CM | POA: Diagnosis not present

## 2017-12-15 DIAGNOSIS — I5032 Chronic diastolic (congestive) heart failure: Secondary | ICD-10-CM | POA: Diagnosis not present

## 2017-12-15 DIAGNOSIS — D62 Acute posthemorrhagic anemia: Secondary | ICD-10-CM | POA: Diagnosis not present

## 2017-12-15 DIAGNOSIS — R279 Unspecified lack of coordination: Secondary | ICD-10-CM | POA: Diagnosis not present

## 2017-12-15 DIAGNOSIS — E119 Type 2 diabetes mellitus without complications: Secondary | ICD-10-CM | POA: Diagnosis not present

## 2017-12-15 DIAGNOSIS — L89892 Pressure ulcer of other site, stage 2: Secondary | ICD-10-CM | POA: Diagnosis not present

## 2017-12-15 DIAGNOSIS — I739 Peripheral vascular disease, unspecified: Secondary | ICD-10-CM | POA: Diagnosis not present

## 2017-12-15 DIAGNOSIS — Z471 Aftercare following joint replacement surgery: Secondary | ICD-10-CM | POA: Diagnosis not present

## 2017-12-15 DIAGNOSIS — J189 Pneumonia, unspecified organism: Secondary | ICD-10-CM | POA: Diagnosis not present

## 2017-12-15 DIAGNOSIS — S72001D Fracture of unspecified part of neck of right femur, subsequent encounter for closed fracture with routine healing: Secondary | ICD-10-CM | POA: Diagnosis not present

## 2017-12-15 DIAGNOSIS — Z96641 Presence of right artificial hip joint: Secondary | ICD-10-CM | POA: Diagnosis not present

## 2017-12-15 DIAGNOSIS — M79675 Pain in left toe(s): Secondary | ICD-10-CM | POA: Diagnosis not present

## 2017-12-15 DIAGNOSIS — R2689 Other abnormalities of gait and mobility: Secondary | ICD-10-CM | POA: Diagnosis not present

## 2017-12-15 DIAGNOSIS — D72829 Elevated white blood cell count, unspecified: Secondary | ICD-10-CM | POA: Diagnosis not present

## 2017-12-15 DIAGNOSIS — I5043 Acute on chronic combined systolic (congestive) and diastolic (congestive) heart failure: Secondary | ICD-10-CM | POA: Diagnosis not present

## 2017-12-15 DIAGNOSIS — I130 Hypertensive heart and chronic kidney disease with heart failure and stage 1 through stage 4 chronic kidney disease, or unspecified chronic kidney disease: Secondary | ICD-10-CM | POA: Diagnosis not present

## 2017-12-15 DIAGNOSIS — R5381 Other malaise: Secondary | ICD-10-CM | POA: Diagnosis not present

## 2017-12-15 DIAGNOSIS — Z9181 History of falling: Secondary | ICD-10-CM | POA: Diagnosis not present

## 2017-12-15 DIAGNOSIS — D6489 Other specified anemias: Secondary | ICD-10-CM | POA: Diagnosis not present

## 2017-12-24 DIAGNOSIS — Z96641 Presence of right artificial hip joint: Secondary | ICD-10-CM | POA: Diagnosis not present

## 2017-12-24 DIAGNOSIS — Z471 Aftercare following joint replacement surgery: Secondary | ICD-10-CM | POA: Diagnosis not present

## 2017-12-26 DIAGNOSIS — I509 Heart failure, unspecified: Secondary | ICD-10-CM

## 2017-12-26 HISTORY — DX: Heart failure, unspecified: I50.9

## 2018-01-07 DIAGNOSIS — Z471 Aftercare following joint replacement surgery: Secondary | ICD-10-CM | POA: Diagnosis not present

## 2018-01-07 DIAGNOSIS — Z96641 Presence of right artificial hip joint: Secondary | ICD-10-CM | POA: Diagnosis not present

## 2018-02-04 DIAGNOSIS — M79675 Pain in left toe(s): Secondary | ICD-10-CM | POA: Diagnosis not present

## 2018-02-04 DIAGNOSIS — L89892 Pressure ulcer of other site, stage 2: Secondary | ICD-10-CM | POA: Diagnosis not present

## 2018-02-04 DIAGNOSIS — L03032 Cellulitis of left toe: Secondary | ICD-10-CM | POA: Diagnosis not present

## 2018-02-04 DIAGNOSIS — M79672 Pain in left foot: Secondary | ICD-10-CM | POA: Diagnosis not present

## 2018-02-13 DIAGNOSIS — I10 Essential (primary) hypertension: Secondary | ICD-10-CM | POA: Diagnosis not present

## 2018-02-13 DIAGNOSIS — I5043 Acute on chronic combined systolic (congestive) and diastolic (congestive) heart failure: Secondary | ICD-10-CM | POA: Diagnosis not present

## 2018-02-13 DIAGNOSIS — E119 Type 2 diabetes mellitus without complications: Secondary | ICD-10-CM | POA: Diagnosis not present

## 2018-02-13 DIAGNOSIS — I872 Venous insufficiency (chronic) (peripheral): Secondary | ICD-10-CM | POA: Diagnosis not present

## 2018-02-18 DIAGNOSIS — L89893 Pressure ulcer of other site, stage 3: Secondary | ICD-10-CM | POA: Diagnosis not present

## 2018-02-18 DIAGNOSIS — I739 Peripheral vascular disease, unspecified: Secondary | ICD-10-CM | POA: Diagnosis not present

## 2018-02-24 ENCOUNTER — Other Ambulatory Visit: Payer: Self-pay | Admitting: Cardiology

## 2018-02-25 DIAGNOSIS — Z7984 Long term (current) use of oral hypoglycemic drugs: Secondary | ICD-10-CM | POA: Diagnosis not present

## 2018-02-25 DIAGNOSIS — E1151 Type 2 diabetes mellitus with diabetic peripheral angiopathy without gangrene: Secondary | ICD-10-CM | POA: Diagnosis not present

## 2018-02-25 DIAGNOSIS — I110 Hypertensive heart disease with heart failure: Secondary | ICD-10-CM | POA: Diagnosis not present

## 2018-02-25 DIAGNOSIS — E11621 Type 2 diabetes mellitus with foot ulcer: Secondary | ICD-10-CM | POA: Diagnosis not present

## 2018-02-25 DIAGNOSIS — I2510 Atherosclerotic heart disease of native coronary artery without angina pectoris: Secondary | ICD-10-CM | POA: Diagnosis not present

## 2018-02-25 DIAGNOSIS — Z96641 Presence of right artificial hip joint: Secondary | ICD-10-CM | POA: Diagnosis not present

## 2018-02-25 DIAGNOSIS — L97521 Non-pressure chronic ulcer of other part of left foot limited to breakdown of skin: Secondary | ICD-10-CM | POA: Diagnosis not present

## 2018-02-25 DIAGNOSIS — S72001D Fracture of unspecified part of neck of right femur, subsequent encounter for closed fracture with routine healing: Secondary | ICD-10-CM | POA: Diagnosis not present

## 2018-02-25 DIAGNOSIS — D649 Anemia, unspecified: Secondary | ICD-10-CM | POA: Diagnosis not present

## 2018-02-25 DIAGNOSIS — E785 Hyperlipidemia, unspecified: Secondary | ICD-10-CM | POA: Diagnosis not present

## 2018-02-25 DIAGNOSIS — Z87891 Personal history of nicotine dependence: Secondary | ICD-10-CM | POA: Diagnosis not present

## 2018-02-25 DIAGNOSIS — I5041 Acute combined systolic (congestive) and diastolic (congestive) heart failure: Secondary | ICD-10-CM | POA: Diagnosis not present

## 2018-02-26 DIAGNOSIS — E11621 Type 2 diabetes mellitus with foot ulcer: Secondary | ICD-10-CM | POA: Diagnosis not present

## 2018-02-26 DIAGNOSIS — I5041 Acute combined systolic (congestive) and diastolic (congestive) heart failure: Secondary | ICD-10-CM | POA: Diagnosis not present

## 2018-02-26 DIAGNOSIS — I2510 Atherosclerotic heart disease of native coronary artery without angina pectoris: Secondary | ICD-10-CM | POA: Diagnosis not present

## 2018-02-26 DIAGNOSIS — L97521 Non-pressure chronic ulcer of other part of left foot limited to breakdown of skin: Secondary | ICD-10-CM | POA: Diagnosis not present

## 2018-02-26 DIAGNOSIS — I110 Hypertensive heart disease with heart failure: Secondary | ICD-10-CM | POA: Diagnosis not present

## 2018-02-26 DIAGNOSIS — S72001D Fracture of unspecified part of neck of right femur, subsequent encounter for closed fracture with routine healing: Secondary | ICD-10-CM | POA: Diagnosis not present

## 2018-03-01 DIAGNOSIS — I2510 Atherosclerotic heart disease of native coronary artery without angina pectoris: Secondary | ICD-10-CM | POA: Diagnosis not present

## 2018-03-01 DIAGNOSIS — I5041 Acute combined systolic (congestive) and diastolic (congestive) heart failure: Secondary | ICD-10-CM | POA: Diagnosis not present

## 2018-03-01 DIAGNOSIS — S72001D Fracture of unspecified part of neck of right femur, subsequent encounter for closed fracture with routine healing: Secondary | ICD-10-CM | POA: Diagnosis not present

## 2018-03-01 DIAGNOSIS — E11621 Type 2 diabetes mellitus with foot ulcer: Secondary | ICD-10-CM | POA: Diagnosis not present

## 2018-03-01 DIAGNOSIS — I110 Hypertensive heart disease with heart failure: Secondary | ICD-10-CM | POA: Diagnosis not present

## 2018-03-01 DIAGNOSIS — L97521 Non-pressure chronic ulcer of other part of left foot limited to breakdown of skin: Secondary | ICD-10-CM | POA: Diagnosis not present

## 2018-03-03 DIAGNOSIS — E11621 Type 2 diabetes mellitus with foot ulcer: Secondary | ICD-10-CM | POA: Diagnosis not present

## 2018-03-03 DIAGNOSIS — L97521 Non-pressure chronic ulcer of other part of left foot limited to breakdown of skin: Secondary | ICD-10-CM | POA: Diagnosis not present

## 2018-03-03 DIAGNOSIS — I5041 Acute combined systolic (congestive) and diastolic (congestive) heart failure: Secondary | ICD-10-CM | POA: Diagnosis not present

## 2018-03-03 DIAGNOSIS — I2510 Atherosclerotic heart disease of native coronary artery without angina pectoris: Secondary | ICD-10-CM | POA: Diagnosis not present

## 2018-03-03 DIAGNOSIS — S72001D Fracture of unspecified part of neck of right femur, subsequent encounter for closed fracture with routine healing: Secondary | ICD-10-CM | POA: Diagnosis not present

## 2018-03-03 DIAGNOSIS — I110 Hypertensive heart disease with heart failure: Secondary | ICD-10-CM | POA: Diagnosis not present

## 2018-03-05 DIAGNOSIS — L89893 Pressure ulcer of other site, stage 3: Secondary | ICD-10-CM | POA: Diagnosis not present

## 2018-03-05 DIAGNOSIS — E1140 Type 2 diabetes mellitus with diabetic neuropathy, unspecified: Secondary | ICD-10-CM | POA: Diagnosis not present

## 2018-03-05 DIAGNOSIS — I739 Peripheral vascular disease, unspecified: Secondary | ICD-10-CM | POA: Diagnosis not present

## 2018-03-05 DIAGNOSIS — E1151 Type 2 diabetes mellitus with diabetic peripheral angiopathy without gangrene: Secondary | ICD-10-CM | POA: Diagnosis not present

## 2018-03-08 DIAGNOSIS — E11621 Type 2 diabetes mellitus with foot ulcer: Secondary | ICD-10-CM | POA: Diagnosis not present

## 2018-03-08 DIAGNOSIS — L97521 Non-pressure chronic ulcer of other part of left foot limited to breakdown of skin: Secondary | ICD-10-CM | POA: Diagnosis not present

## 2018-03-08 DIAGNOSIS — I2510 Atherosclerotic heart disease of native coronary artery without angina pectoris: Secondary | ICD-10-CM | POA: Diagnosis not present

## 2018-03-08 DIAGNOSIS — I5041 Acute combined systolic (congestive) and diastolic (congestive) heart failure: Secondary | ICD-10-CM | POA: Diagnosis not present

## 2018-03-08 DIAGNOSIS — S72001D Fracture of unspecified part of neck of right femur, subsequent encounter for closed fracture with routine healing: Secondary | ICD-10-CM | POA: Diagnosis not present

## 2018-03-08 DIAGNOSIS — I110 Hypertensive heart disease with heart failure: Secondary | ICD-10-CM | POA: Diagnosis not present

## 2018-03-09 DIAGNOSIS — K210 Gastro-esophageal reflux disease with esophagitis: Secondary | ICD-10-CM | POA: Diagnosis not present

## 2018-03-09 DIAGNOSIS — Z0000 Encounter for general adult medical examination without abnormal findings: Secondary | ICD-10-CM | POA: Diagnosis not present

## 2018-03-09 DIAGNOSIS — I10 Essential (primary) hypertension: Secondary | ICD-10-CM | POA: Diagnosis not present

## 2018-03-09 DIAGNOSIS — I872 Venous insufficiency (chronic) (peripheral): Secondary | ICD-10-CM | POA: Diagnosis not present

## 2018-03-09 DIAGNOSIS — E119 Type 2 diabetes mellitus without complications: Secondary | ICD-10-CM | POA: Diagnosis not present

## 2018-03-09 DIAGNOSIS — M545 Low back pain: Secondary | ICD-10-CM | POA: Diagnosis not present

## 2018-03-09 DIAGNOSIS — E7849 Other hyperlipidemia: Secondary | ICD-10-CM | POA: Diagnosis not present

## 2018-03-09 DIAGNOSIS — Z1389 Encounter for screening for other disorder: Secondary | ICD-10-CM | POA: Diagnosis not present

## 2018-03-10 DIAGNOSIS — L97521 Non-pressure chronic ulcer of other part of left foot limited to breakdown of skin: Secondary | ICD-10-CM | POA: Diagnosis not present

## 2018-03-10 DIAGNOSIS — E11621 Type 2 diabetes mellitus with foot ulcer: Secondary | ICD-10-CM | POA: Diagnosis not present

## 2018-03-10 DIAGNOSIS — S72001D Fracture of unspecified part of neck of right femur, subsequent encounter for closed fracture with routine healing: Secondary | ICD-10-CM | POA: Diagnosis not present

## 2018-03-10 DIAGNOSIS — I2510 Atherosclerotic heart disease of native coronary artery without angina pectoris: Secondary | ICD-10-CM | POA: Diagnosis not present

## 2018-03-10 DIAGNOSIS — I110 Hypertensive heart disease with heart failure: Secondary | ICD-10-CM | POA: Diagnosis not present

## 2018-03-10 DIAGNOSIS — I5041 Acute combined systolic (congestive) and diastolic (congestive) heart failure: Secondary | ICD-10-CM | POA: Diagnosis not present

## 2018-03-12 DIAGNOSIS — I110 Hypertensive heart disease with heart failure: Secondary | ICD-10-CM | POA: Diagnosis not present

## 2018-03-12 DIAGNOSIS — S72001D Fracture of unspecified part of neck of right femur, subsequent encounter for closed fracture with routine healing: Secondary | ICD-10-CM | POA: Diagnosis not present

## 2018-03-12 DIAGNOSIS — I2510 Atherosclerotic heart disease of native coronary artery without angina pectoris: Secondary | ICD-10-CM | POA: Diagnosis not present

## 2018-03-12 DIAGNOSIS — E11621 Type 2 diabetes mellitus with foot ulcer: Secondary | ICD-10-CM | POA: Diagnosis not present

## 2018-03-12 DIAGNOSIS — I5041 Acute combined systolic (congestive) and diastolic (congestive) heart failure: Secondary | ICD-10-CM | POA: Diagnosis not present

## 2018-03-12 DIAGNOSIS — L97521 Non-pressure chronic ulcer of other part of left foot limited to breakdown of skin: Secondary | ICD-10-CM | POA: Diagnosis not present

## 2018-03-15 DIAGNOSIS — E1140 Type 2 diabetes mellitus with diabetic neuropathy, unspecified: Secondary | ICD-10-CM | POA: Diagnosis not present

## 2018-03-15 DIAGNOSIS — S72001D Fracture of unspecified part of neck of right femur, subsequent encounter for closed fracture with routine healing: Secondary | ICD-10-CM | POA: Diagnosis not present

## 2018-03-15 DIAGNOSIS — I110 Hypertensive heart disease with heart failure: Secondary | ICD-10-CM | POA: Diagnosis not present

## 2018-03-15 DIAGNOSIS — E11621 Type 2 diabetes mellitus with foot ulcer: Secondary | ICD-10-CM | POA: Diagnosis not present

## 2018-03-15 DIAGNOSIS — E1151 Type 2 diabetes mellitus with diabetic peripheral angiopathy without gangrene: Secondary | ICD-10-CM | POA: Diagnosis not present

## 2018-03-15 DIAGNOSIS — L89893 Pressure ulcer of other site, stage 3: Secondary | ICD-10-CM | POA: Diagnosis not present

## 2018-03-15 DIAGNOSIS — I5041 Acute combined systolic (congestive) and diastolic (congestive) heart failure: Secondary | ICD-10-CM | POA: Diagnosis not present

## 2018-03-15 DIAGNOSIS — I2510 Atherosclerotic heart disease of native coronary artery without angina pectoris: Secondary | ICD-10-CM | POA: Diagnosis not present

## 2018-03-15 DIAGNOSIS — L97521 Non-pressure chronic ulcer of other part of left foot limited to breakdown of skin: Secondary | ICD-10-CM | POA: Diagnosis not present

## 2018-03-16 DIAGNOSIS — I5041 Acute combined systolic (congestive) and diastolic (congestive) heart failure: Secondary | ICD-10-CM | POA: Diagnosis not present

## 2018-03-16 DIAGNOSIS — I110 Hypertensive heart disease with heart failure: Secondary | ICD-10-CM | POA: Diagnosis not present

## 2018-03-16 DIAGNOSIS — L97521 Non-pressure chronic ulcer of other part of left foot limited to breakdown of skin: Secondary | ICD-10-CM | POA: Diagnosis not present

## 2018-03-16 DIAGNOSIS — E11621 Type 2 diabetes mellitus with foot ulcer: Secondary | ICD-10-CM | POA: Diagnosis not present

## 2018-03-16 DIAGNOSIS — S72001D Fracture of unspecified part of neck of right femur, subsequent encounter for closed fracture with routine healing: Secondary | ICD-10-CM | POA: Diagnosis not present

## 2018-03-16 DIAGNOSIS — I2510 Atherosclerotic heart disease of native coronary artery without angina pectoris: Secondary | ICD-10-CM | POA: Diagnosis not present

## 2018-03-17 DIAGNOSIS — I2510 Atherosclerotic heart disease of native coronary artery without angina pectoris: Secondary | ICD-10-CM | POA: Diagnosis not present

## 2018-03-17 DIAGNOSIS — I110 Hypertensive heart disease with heart failure: Secondary | ICD-10-CM | POA: Diagnosis not present

## 2018-03-17 DIAGNOSIS — I5041 Acute combined systolic (congestive) and diastolic (congestive) heart failure: Secondary | ICD-10-CM | POA: Diagnosis not present

## 2018-03-17 DIAGNOSIS — S72001D Fracture of unspecified part of neck of right femur, subsequent encounter for closed fracture with routine healing: Secondary | ICD-10-CM | POA: Diagnosis not present

## 2018-03-17 DIAGNOSIS — L97521 Non-pressure chronic ulcer of other part of left foot limited to breakdown of skin: Secondary | ICD-10-CM | POA: Diagnosis not present

## 2018-03-17 DIAGNOSIS — E11621 Type 2 diabetes mellitus with foot ulcer: Secondary | ICD-10-CM | POA: Diagnosis not present

## 2018-03-18 DIAGNOSIS — I2510 Atherosclerotic heart disease of native coronary artery without angina pectoris: Secondary | ICD-10-CM | POA: Diagnosis not present

## 2018-03-18 DIAGNOSIS — S72001D Fracture of unspecified part of neck of right femur, subsequent encounter for closed fracture with routine healing: Secondary | ICD-10-CM | POA: Diagnosis not present

## 2018-03-18 DIAGNOSIS — L97521 Non-pressure chronic ulcer of other part of left foot limited to breakdown of skin: Secondary | ICD-10-CM | POA: Diagnosis not present

## 2018-03-18 DIAGNOSIS — I110 Hypertensive heart disease with heart failure: Secondary | ICD-10-CM | POA: Diagnosis not present

## 2018-03-18 DIAGNOSIS — I5041 Acute combined systolic (congestive) and diastolic (congestive) heart failure: Secondary | ICD-10-CM | POA: Diagnosis not present

## 2018-03-18 DIAGNOSIS — E11621 Type 2 diabetes mellitus with foot ulcer: Secondary | ICD-10-CM | POA: Diagnosis not present

## 2018-03-22 DIAGNOSIS — E11621 Type 2 diabetes mellitus with foot ulcer: Secondary | ICD-10-CM | POA: Diagnosis not present

## 2018-03-22 DIAGNOSIS — S72001D Fracture of unspecified part of neck of right femur, subsequent encounter for closed fracture with routine healing: Secondary | ICD-10-CM | POA: Diagnosis not present

## 2018-03-22 DIAGNOSIS — L97521 Non-pressure chronic ulcer of other part of left foot limited to breakdown of skin: Secondary | ICD-10-CM | POA: Diagnosis not present

## 2018-03-22 DIAGNOSIS — I2510 Atherosclerotic heart disease of native coronary artery without angina pectoris: Secondary | ICD-10-CM | POA: Diagnosis not present

## 2018-03-22 DIAGNOSIS — I110 Hypertensive heart disease with heart failure: Secondary | ICD-10-CM | POA: Diagnosis not present

## 2018-03-22 DIAGNOSIS — I5041 Acute combined systolic (congestive) and diastolic (congestive) heart failure: Secondary | ICD-10-CM | POA: Diagnosis not present

## 2018-03-23 DIAGNOSIS — H353124 Nonexudative age-related macular degeneration, left eye, advanced atrophic with subfoveal involvement: Secondary | ICD-10-CM | POA: Diagnosis not present

## 2018-03-23 DIAGNOSIS — H43813 Vitreous degeneration, bilateral: Secondary | ICD-10-CM | POA: Diagnosis not present

## 2018-03-23 DIAGNOSIS — H353113 Nonexudative age-related macular degeneration, right eye, advanced atrophic without subfoveal involvement: Secondary | ICD-10-CM | POA: Diagnosis not present

## 2018-03-23 DIAGNOSIS — E113493 Type 2 diabetes mellitus with severe nonproliferative diabetic retinopathy without macular edema, bilateral: Secondary | ICD-10-CM | POA: Diagnosis not present

## 2018-03-25 DIAGNOSIS — S72001D Fracture of unspecified part of neck of right femur, subsequent encounter for closed fracture with routine healing: Secondary | ICD-10-CM | POA: Diagnosis not present

## 2018-03-25 DIAGNOSIS — I2510 Atherosclerotic heart disease of native coronary artery without angina pectoris: Secondary | ICD-10-CM | POA: Diagnosis not present

## 2018-03-25 DIAGNOSIS — I5041 Acute combined systolic (congestive) and diastolic (congestive) heart failure: Secondary | ICD-10-CM | POA: Diagnosis not present

## 2018-03-25 DIAGNOSIS — L97521 Non-pressure chronic ulcer of other part of left foot limited to breakdown of skin: Secondary | ICD-10-CM | POA: Diagnosis not present

## 2018-03-25 DIAGNOSIS — E11621 Type 2 diabetes mellitus with foot ulcer: Secondary | ICD-10-CM | POA: Diagnosis not present

## 2018-03-25 DIAGNOSIS — I110 Hypertensive heart disease with heart failure: Secondary | ICD-10-CM | POA: Diagnosis not present

## 2018-04-01 DIAGNOSIS — I5041 Acute combined systolic (congestive) and diastolic (congestive) heart failure: Secondary | ICD-10-CM | POA: Diagnosis not present

## 2018-04-01 DIAGNOSIS — S72001D Fracture of unspecified part of neck of right femur, subsequent encounter for closed fracture with routine healing: Secondary | ICD-10-CM | POA: Diagnosis not present

## 2018-04-01 DIAGNOSIS — I2510 Atherosclerotic heart disease of native coronary artery without angina pectoris: Secondary | ICD-10-CM | POA: Diagnosis not present

## 2018-04-01 DIAGNOSIS — L97521 Non-pressure chronic ulcer of other part of left foot limited to breakdown of skin: Secondary | ICD-10-CM | POA: Diagnosis not present

## 2018-04-01 DIAGNOSIS — I110 Hypertensive heart disease with heart failure: Secondary | ICD-10-CM | POA: Diagnosis not present

## 2018-04-01 DIAGNOSIS — E11621 Type 2 diabetes mellitus with foot ulcer: Secondary | ICD-10-CM | POA: Diagnosis not present

## 2018-04-02 DIAGNOSIS — S72001D Fracture of unspecified part of neck of right femur, subsequent encounter for closed fracture with routine healing: Secondary | ICD-10-CM | POA: Diagnosis not present

## 2018-04-02 DIAGNOSIS — I110 Hypertensive heart disease with heart failure: Secondary | ICD-10-CM | POA: Diagnosis not present

## 2018-04-02 DIAGNOSIS — E11621 Type 2 diabetes mellitus with foot ulcer: Secondary | ICD-10-CM | POA: Diagnosis not present

## 2018-04-02 DIAGNOSIS — L97521 Non-pressure chronic ulcer of other part of left foot limited to breakdown of skin: Secondary | ICD-10-CM | POA: Diagnosis not present

## 2018-04-02 DIAGNOSIS — I2510 Atherosclerotic heart disease of native coronary artery without angina pectoris: Secondary | ICD-10-CM | POA: Diagnosis not present

## 2018-04-02 DIAGNOSIS — I5041 Acute combined systolic (congestive) and diastolic (congestive) heart failure: Secondary | ICD-10-CM | POA: Diagnosis not present

## 2018-04-05 DIAGNOSIS — E11621 Type 2 diabetes mellitus with foot ulcer: Secondary | ICD-10-CM | POA: Diagnosis not present

## 2018-04-05 DIAGNOSIS — L97521 Non-pressure chronic ulcer of other part of left foot limited to breakdown of skin: Secondary | ICD-10-CM | POA: Diagnosis not present

## 2018-04-05 DIAGNOSIS — I110 Hypertensive heart disease with heart failure: Secondary | ICD-10-CM | POA: Diagnosis not present

## 2018-04-05 DIAGNOSIS — I2510 Atherosclerotic heart disease of native coronary artery without angina pectoris: Secondary | ICD-10-CM | POA: Diagnosis not present

## 2018-04-05 DIAGNOSIS — I5041 Acute combined systolic (congestive) and diastolic (congestive) heart failure: Secondary | ICD-10-CM | POA: Diagnosis not present

## 2018-04-05 DIAGNOSIS — S72001D Fracture of unspecified part of neck of right femur, subsequent encounter for closed fracture with routine healing: Secondary | ICD-10-CM | POA: Diagnosis not present

## 2018-04-06 DIAGNOSIS — L89892 Pressure ulcer of other site, stage 2: Secondary | ICD-10-CM | POA: Diagnosis not present

## 2018-04-06 DIAGNOSIS — E1151 Type 2 diabetes mellitus with diabetic peripheral angiopathy without gangrene: Secondary | ICD-10-CM | POA: Diagnosis not present

## 2018-04-06 DIAGNOSIS — E1140 Type 2 diabetes mellitus with diabetic neuropathy, unspecified: Secondary | ICD-10-CM | POA: Diagnosis not present

## 2018-04-07 DIAGNOSIS — L97521 Non-pressure chronic ulcer of other part of left foot limited to breakdown of skin: Secondary | ICD-10-CM | POA: Diagnosis not present

## 2018-04-07 DIAGNOSIS — S72001D Fracture of unspecified part of neck of right femur, subsequent encounter for closed fracture with routine healing: Secondary | ICD-10-CM | POA: Diagnosis not present

## 2018-04-07 DIAGNOSIS — E11621 Type 2 diabetes mellitus with foot ulcer: Secondary | ICD-10-CM | POA: Diagnosis not present

## 2018-04-07 DIAGNOSIS — I2510 Atherosclerotic heart disease of native coronary artery without angina pectoris: Secondary | ICD-10-CM | POA: Diagnosis not present

## 2018-04-07 DIAGNOSIS — I110 Hypertensive heart disease with heart failure: Secondary | ICD-10-CM | POA: Diagnosis not present

## 2018-04-07 DIAGNOSIS — I5041 Acute combined systolic (congestive) and diastolic (congestive) heart failure: Secondary | ICD-10-CM | POA: Diagnosis not present

## 2018-04-08 ENCOUNTER — Encounter (INDEPENDENT_AMBULATORY_CARE_PROVIDER_SITE_OTHER): Payer: Self-pay

## 2018-04-08 ENCOUNTER — Ambulatory Visit (INDEPENDENT_AMBULATORY_CARE_PROVIDER_SITE_OTHER)
Admission: RE | Admit: 2018-04-08 | Discharge: 2018-04-08 | Disposition: A | Discharge status: Home / Self Care | Payer: Medicare Other | Source: Ambulatory Visit | Attending: Vascular Surgery | Admitting: Vascular Surgery

## 2018-04-08 ENCOUNTER — Ambulatory Visit (INDEPENDENT_AMBULATORY_CARE_PROVIDER_SITE_OTHER): Payer: Medicare Other | Admitting: Family

## 2018-04-08 ENCOUNTER — Encounter: Payer: Self-pay | Admitting: Family

## 2018-04-08 ENCOUNTER — Other Ambulatory Visit: Payer: Self-pay

## 2018-04-08 ENCOUNTER — Ambulatory Visit (HOSPITAL_COMMUNITY)
Admission: RE | Admit: 2018-04-08 | Discharge: 2018-04-08 | Disposition: A | Discharge status: Home / Self Care | Payer: Medicare Other | Source: Ambulatory Visit | Attending: Vascular Surgery | Admitting: Vascular Surgery

## 2018-04-08 ENCOUNTER — Encounter: Payer: Self-pay | Admitting: *Deleted

## 2018-04-08 ENCOUNTER — Other Ambulatory Visit: Payer: Self-pay | Admitting: *Deleted

## 2018-04-08 VITALS — BP 124/62 | HR 80 | Temp 97.3°F | Resp 20 | Ht 60.0 in | Wt 133.9 lb

## 2018-04-08 DIAGNOSIS — I739 Peripheral vascular disease, unspecified: Secondary | ICD-10-CM | POA: Diagnosis not present

## 2018-04-08 DIAGNOSIS — I779 Disorder of arteries and arterioles, unspecified: Secondary | ICD-10-CM

## 2018-04-08 DIAGNOSIS — I509 Heart failure, unspecified: Secondary | ICD-10-CM

## 2018-04-08 DIAGNOSIS — I6523 Occlusion and stenosis of bilateral carotid arteries: Secondary | ICD-10-CM

## 2018-04-08 DIAGNOSIS — Z89411 Acquired absence of right great toe: Secondary | ICD-10-CM | POA: Diagnosis not present

## 2018-04-08 DIAGNOSIS — Z95828 Presence of other vascular implants and grafts: Secondary | ICD-10-CM | POA: Insufficient documentation

## 2018-04-08 DIAGNOSIS — L03039 Cellulitis of unspecified toe: Secondary | ICD-10-CM | POA: Diagnosis not present

## 2018-04-08 DIAGNOSIS — L97522 Non-pressure chronic ulcer of other part of left foot with fat layer exposed: Secondary | ICD-10-CM

## 2018-04-08 DIAGNOSIS — Z87891 Personal history of nicotine dependence: Secondary | ICD-10-CM

## 2018-04-08 DIAGNOSIS — I70202 Unspecified atherosclerosis of native arteries of extremities, left leg: Secondary | ICD-10-CM | POA: Insufficient documentation

## 2018-04-08 DIAGNOSIS — R609 Edema, unspecified: Secondary | ICD-10-CM | POA: Diagnosis not present

## 2018-04-08 NOTE — Progress Notes (Signed)
[facility] [place] HISTORY AND PHYSICAL   CC: new ulcers on left foot, history of peripheral artery occlusive disease and dependent edema of lower legs, CHF   History of Present Illness:   Cynthia Ray is a 84 y.o. female who previously underwent right femoral to popliteal artery bypass and amputation of the distal tip of right first toe in August 2013 by CynthiaFields.She also has a left superficial femoral artery stent placed in 2004 and chronic occlusion of the distal superficial femoral artery and popliteal artery based on angiography.She ambulates with the assistance of a walker sometimes.   She returns today at the request of her podiatrist, Dr. Allene Ray, to evaluate and address left foot ulcer at the base of her left metatarsus, medial aspect, and what appears to be abrasion type lesions at the tips pf all of her left toes and lateral aspect of he left foot. Pt daughter states Dr. Berline Ray saw her yesterday.  Daughter states pt that both of pt's feet slide when she gets out of her recliner, and some of these abrasions happened in the last few days. Daughter states the ulcer at the base of her left great toe, medial aspect, was noticed in July 2019, after she got home from a SNF; she was there for rehab after surgery on 11-29-17 to repair right hip fracture after she fell.  Pt is home and receives physical therapy and [place] visits weekly.  Daughter states [place] is managing dressing changes of left foot and daughter performs daily dressing changes to wounds of left foot with Silvadene.   She is also been followed for carotid stenosis. Shedeniesamaurosis fugax, sudden onset weakness or numbness in extremities and slurred speech.  Her past medical history includes hyperlipidemia, hypertension and diabetes which have all been stable.She is unable to take aspirin. She is on a statin. She is a former smoker.  She has CHF.   Pt was last evaluated on 07-31-16 by Dr. Oneida Ray  and K. Trinh PA-C. At that time the right lower extremity bypass graft was patent. She denied any issues with her legs bilaterally. Will follow-up in 1 year with repeat duplex and ABIs. The patient denies any TIA or stroke symptoms. Her carotid stenosis has been stable since her duplex last year. Follow-up in 1 year with repeat carotid duplex.   Daughter states pt sits in her recliner and does not elevate her legs, and eats too much salt.    Diabetic: Yes, she does not know her A1C, was 8.0 in 2016 Tobacco use: former smoker, quit in 1989, started in 1965  Pt meds include: Statin :Yes Betablocker: Yes ASA: Yes Other anticoagulants/antiplatelets: Plavix     Current Outpatient Medications  Medication Sig Dispense Refill  . amLODipine (NORVASC) 10 MG tablet TAKE ONE TABLET BY MOUTH DAILY. 90 tablet 1  . aspirin 81 MG chewable tablet Chew by mouth daily.    Marland Kitchen atorvastatin (LIPITOR) 80 MG tablet Take 80 mg by mouth daily.    . cholecalciferol (VITAMIN D) 1000 UNITS tablet Take 1,000 Units by mouth daily.    . clopidogrel (PLAVIX) 75 MG tablet TAKE ONE TABLET BY MOUTH DAILY. 90 tablet 1  . ferrous sulfate 325 (65 FE) MG tablet Take 325 mg by mouth 2 (two) times daily.    . furosemide (LASIX) 20 MG tablet Take 20 mg by mouth daily.  0  . glipiZIDE (GLUCOTROL) 5 MG tablet Take 5 mg by mouth 2 (two) times daily before a  meal.    . iron polysaccharides (NIFEREX) 150 MG capsule Take by mouth.    Marland Kitchen lisinopril (PRINIVIL,ZESTRIL) 40 MG tablet Take 1 tablet (40 mg total) by mouth daily. 30 tablet 6  . magnesium oxide (MAG-OX) 400 MG tablet Take 400 mg by mouth daily.    . metFORMIN (GLUCOPHAGE) 500 MG tablet Take 500 mg by mouth 3 (three) times daily.    . metoprolol tartrate (LOPRESSOR) 25 MG tablet Take 1 tablet (25 mg total) by mouth 2 (two) times daily. 180 tablet 3  . nitroGLYCERIN (NITROSTAT) 0.4 MG SL tablet Place 0.4 mg under the tongue every 5 (five) minutes as needed for chest pain.      Marland Kitchen omeprazole (PRILOSEC) 40 MG capsule Take 40 mg by mouth as needed.    . traMADol (ULTRAM) 50 MG tablet Take 50 mg by mouth 2 (two) times daily.     . vitamin C (ASCORBIC ACID) 500 MG tablet Take by mouth.     No current facility-administered medications for this visit.     Past Medical History:  Diagnosis Date  . Anemia   . Arthritis    "dr said I have it in my back" [place]   . Carotid artery occlusion   . CHF (congestive heart failure) ([place]) 12/2017  . COPD (chronic obstructive pulmonary disease) ([place])   . Fractured hip ([place])    Fell and hurt R hip  . GERD (gastroesophageal reflux disease)   . History of blood transfusion 11/11/11  . Hyperlipidemia   . Hypertension   . PVD (peripheral vascular disease) ([place])    Stents left SFA 2004 Cynthia Ray  . Type II diabetes mellitus ([place])     Social History Social History   Tobacco Use  . Smoking status: Former Smoker    Packs/day: 1.00    Years: 24.00    Pack years: 24.00    Types: Cigarettes    Start date: 07/29/1963    Last attempt to quit: 07/29/1987    Years since quitting: 30.7  . Smokeless tobacco: Never Used  Substance Use Topics  . Alcohol use: No    Alcohol/week: 0.0 standard drinks  . Drug use: No    Family History Family History  Problem Relation Age of Onset  . Stroke Father 56  . Hypertension Father   . Stroke Mother 83  . Diabetes Mother   . Hypertension Mother   . Diabetes Sister   . Hyperlipidemia Sister   . Hypertension Sister   . Diabetes Brother   . Hypertension Brother     Surgical History Past Surgical History:  Procedure Laterality Date  . ABDOMINAL AORTAGRAM N/A 10/31/2011   Procedure: ABDOMINAL Maxcine Ham;  Surgeon: Elam Dutch, MD;  Location[facility] CATH LAB;  Service: Cardiovascular;  Laterality: N/A;  . AMPUTATION  03/23/2012   Procedure: AMPUTATION DIGIT;  Surgeon: Elam Dutch, MD;  Location: [facility] OR;  Service: Vascular;  Laterality: Right;  Right Femoral endarterectomy with  profundaplasty; right femoral-popliteal bypass with nonreversed saphenous vein; and right first toe amputation  . CATARACT EXTRACTION W/ INTRAOCULAR LENS  IMPLANT, BILATERAL  2002  . COLONOSCOPY    . CORONARY ANGIOPLASTY WITH STENT PLACEMENT  01/19/12   "1; first one I've ever had"  . EYE SURGERY  2002   Cataract  . EYE SURGERY  2009   Laser bilateral   . FEMORAL ENDARTERECTOMY  03/23/2012   right  . FEMORAL-POPLITEAL BYPASS GRAFT  03/23/2012   Procedure: BYPASS GRAFT FEMORAL-POPLITEAL ARTERY;  Surgeon: Elam Dutch, MD;  Location: [facility] OR;  Service: Vascular;  Laterality: Right;  Right Femoral endarterectomy with profundaplasty; right femoral-popliteal bypass with nonreversed saphenous vein; and right first toe amputation  . PERCUTANEOUS CORONARY STENT INTERVENTION (PCI-S) N/A 01/19/2012   Procedure: PERCUTANEOUS CORONARY STENT INTERVENTION (PCI-S);  Surgeon: Peter M Martinique, MD;  Location: [facility] CATH LAB;  Service: Cardiovascular;  Laterality: N/A;  . PERIPHERAL ARTERIAL STENT GRAFT  2003   LLE  . POSTERIOR LAMINECTOMY / DECOMPRESSION LUMBAR SPINE  1989  . REFRACTIVE SURGERY  2009   bilaterally  . TONSILLECTOMY AND ADENOIDECTOMY  1946    Allergies  Allergen Reactions  . Ibuprofen Other (See Comments)    Dr. Britta Mccreedy informed patient not to take this due to ulcer     Current Outpatient Medications  Medication Sig Dispense Refill  . amLODipine (NORVASC) 10 MG tablet TAKE ONE TABLET BY MOUTH DAILY. 90 tablet 1  . aspirin 81 MG chewable tablet Chew by mouth daily.    Marland Kitchen atorvastatin (LIPITOR) 80 MG tablet Take 80 mg by mouth daily.    . cholecalciferol (VITAMIN D) 1000 UNITS tablet Take 1,000 Units by mouth daily.    . clopidogrel (PLAVIX) 75 MG tablet TAKE ONE TABLET BY MOUTH DAILY. 90 tablet 1  . ferrous sulfate 325 (65 FE) MG tablet Take 325 mg by mouth 2 (two) times daily.    . furosemide (LASIX) 20 MG tablet Take 20 mg by mouth daily.  0  . glipiZIDE (GLUCOTROL) 5 MG tablet Take 5  mg by mouth 2 (two) times daily before a meal.    . iron polysaccharides (NIFEREX) 150 MG capsule Take by mouth.    Marland Kitchen lisinopril (PRINIVIL,ZESTRIL) 40 MG tablet Take 1 tablet (40 mg total) by mouth daily. 30 tablet 6  . magnesium oxide (MAG-OX) 400 MG tablet Take 400 mg by mouth daily.    . metFORMIN (GLUCOPHAGE) 500 MG tablet Take 500 mg by mouth 3 (three) times daily.    . metoprolol tartrate (LOPRESSOR) 25 MG tablet Take 1 tablet (25 mg total) by mouth 2 (two) times daily. 180 tablet 3  . nitroGLYCERIN (NITROSTAT) 0.4 MG SL tablet Place 0.4 mg under the tongue every 5 (five) minutes as needed for chest pain.    Marland Kitchen omeprazole (PRILOSEC) 40 MG capsule Take 40 mg by mouth as needed.    . traMADol (ULTRAM) 50 MG tablet Take 50 mg by mouth 2 (two) times daily.     . vitamin C (ASCORBIC ACID) 500 MG tablet Take by mouth.     No current facility-administered medications for this visit.      REVIEW OF SYSTEMS: See HPI for pertinent positives and negatives.  Physical Examination Vitals:   04/08/18 1158  BP: 124/62  Pulse: 80  Resp: 20  Temp: (!) 97.3 F (36.3 C)  TempSrc: Oral  SpO2: 95%  Weight: 133 lb 14.4 oz (60.7 kg)  Height: 5' (1.524 m)   Body mass index is 26.15 kg/m.  General:  WDWN elderly female NAD Gait: using walker, slow, deliberate HENT: No gross abnormalities Eyes: PERRLA Pulmonary: normal non-labored breathing, good air movement, +rales in right base, no rhonchi or wheezing. Cardiac: RRR, no murmur detected Abdomen: soft, NT, no masses palpated Skin: no rashes, See Extremities  VASCULAR EXAM  Carotid Bruits Right Left   Negative Negative      Radial pulses are 2+ palpable bilaterally   Adominal aortic pulse is not palpable  VASCULAR EXAM: Extremities without ischemic changes, without Gangrene; without open wounds. 2-3+ pitting edema in right foot and lower leg, 1-2+ in left foot and lower leg.  Small raised mass at right medial  mid/distal thigh that measures about 3.5 x 4 cm. Surgically absent distal tip of right great toe.  Ulcer at the base of her left first metatarsus, medial aspect, and abrasions appearing wounds at the tips of all of her left toes and lateral aspect left foot. See photos below.     Left foot    Left foot    Left foot                                                                                                                                                        LE Pulses Right Left       FEMORAL  1+ palpable  1+ palpable        POPLITEAL  not palpable   not palpable       POSTERIOR TIBIAL  not palpable   not palpable        DORSALIS PEDIS      ANTERIOR TIBIAL not palpable  not palpable     Musculoskeletal: no muscle wasting or atrophy. Moderate thoracic kyphosis.  Neurologic:  A&O X 3; appropriate affect, sensation is normal; speech is normal, CN 2-12 intact, pain and light touch intact in extremities, motor exam as listed above. Psychiatric: Normal mood and affect for clinical situation.      ASSESSMENT:  Cynthia Ray is a 84 y.o. female who is s/p right femoral to popliteal artery bypass and amputation of the distal tip of right first toe in August 2013 by CynthiaFields.She also has a left superficial femoral artery stent and chronic occlusion of the distal superficial femoral artery and popliteal artery based on angiography. She has no claudication symptoms with walking.  She has an ischemic ulcer at the base of her left first metatarsus, medial aspect, and abrasions appearing wounds at the tips of all of her left toes and lateral aspect left foot. By history from daughter, left foot ulcer was noted in July 2019.  Dr. Oneida Ray spoke with pt and daughter and examined pt. See Plan.  Pt states the lump noted on duplex at her right medial thigh, and noted on exam, has been there since the right leg arterial bypass graft, is a cystic structure on duplex.  She  has no hx of stroke or TIA.  Dependent edema in both lower legs and feet since she sits most of the day with her feet dependent; see Patient Instructions for elevation of legs.   DATA  Left LE Arterial Duplex (04-08-18): Left Duplex Findings: +-----------+--------+-----+---------------+----------+--------+       PSV cm/sRatioStenosis    Waveform Comments +-----------+--------+-----+---------------+----------+--------+ CFA Distal 102  monophasic     +-----------+--------+-----+---------------+----------+--------+ DFA    196              monophasic     +-----------+--------+-----+---------------+----------+--------+ SFA Prox  395      75-99% stenosismonophasic     +-----------+--------+-----+---------------+----------+--------+ SFA Mid  296      75-99% stenosismonophasic     +-----------+--------+-----+---------------+----------+--------+ SFA Distal 75              monophasic     +-----------+--------+-----+---------------+----------+--------+ POP Prox         occluded              +-----------+--------+-----+---------------+----------+--------+ ATA Distal 34              monophasic     +-----------+--------+-----+---------------+----------+--------+ PTA Distal 7               monophasic     +-----------+--------+-----+---------------+----------+--------+ PERO Distal10              monophasic     +-----------+--------+-----+---------------+----------+--------+  A focal velocity elevation of 395 cm/s was obtained at Origin SFA with post stenotic turbulence with a VR of 3.9. Findings are characteristic of 75-99% stenosis. A 2nd focal velocity elevation was visualized, measuring 296 cm/s at Prox/mid SFA with post  stenotic turbulence with a VR of 4.23. Findings are  characteristic of 75-99% stenosis. Final Interpretation: Left: Patent stent. There is stenosis at the origin of the SFA and in the stent in the mid thigh. The popliteal artery appears occluded.    Bilateral LE Arterial Duplex (08/06/17): A cursory look was given to the lower extremity venous system: no DVT. Cystic structure with no internal flow in the right medial mid/distal thigh region measuring approximately 3.3 x 3.9 cm. Patent right LE bypass graft with no restenosis, all biphasic waveforms.  Left proximal SFA stenosis at 50-99% with hx of stent placed, stent is poorly visualized due to calcified plaque.  No significant change compared to exam on 07-31-16.    ABI (Date: 04/08/2018):  R:   ABI: [place] (was [place] on 08-06-17),   PT: mono  DP: mono  TBI:  Right great toe tip surgically absent   L:   ABI: [place] (was [place]),   PT: not found  DP: dampened mono  TBI: ulceration   Carotid Duplex (08-06-17): 40-59% right internal carotid artery stenosis 1-39% left proximal internal carotid artery stenosis Bilateral vertebral artery flow is antegrade.  Bilateral subclavian artery waveforms are normal.  No significant change compared to the exam on 07-31-16.    PLAN:   Elevation of legs, daily seated leg exercises as discussed and demonstrated.  Based on today's exam, HPI, and non-invasive vascular lab results, the patient will be scheduled for an arteriogram with bilateral run off, possible intervention by Dr. Oneida Ray on 04-16-18.   I discussed in depth with the patient the nature of atherosclerosis, and emphasized the importance of maximal medical management including strict control of blood pressure, blood glucose, and lipid levels, obtaining regular exercise, and cessation of smoking.  The patient is aware that without maximal medical management the underlying atherosclerotic disease process will progress, limiting the benefit of any interventions.  The patient was given information  about stroke prevention and what symptoms should prompt the patient to seek immediate medical care.  The patient was given information about PAD including signs, symptoms, treatment, what symptoms should prompt the patient to seek immediate medical care, and risk reduction measures to take.  Thank you for allowing [place]  to participate in this patient's care.  Clemon Chambers, RN, MSN, FNP-C [facility] Office: [phone]  Clinic MD: Cynthia Ray 04/08/2018 12:20 PM

## 2018-04-08 NOTE — Patient Instructions (Addendum)
To decrease swelling in your feet and legs: Elevate feet above slightly bent knees, feet above heart, overnight and 3-4 times per day for 20 minutes.    Edema Edema is when you have too much fluid in your body or under your skin. Edema may make your legs, feet, and ankles swell up. Swelling is also common in looser tissues, like around your eyes. This is a common condition. It gets more common as you get older. There are many possible causes of edema. Eating too much salt (sodium) and being on your feet or sitting for a long time can cause edema in your legs, feet, and ankles. Hot weather may make edema worse. Edema is usually painless. Your skin may look swollen or shiny. Follow these instructions at home:  Keep the swollen body part raised (elevated) above the level of your heart when you are sitting or lying down.  Do not sit still or stand for a long time.  Do not wear tight clothes. Do not wear garters on your upper legs.  Exercise your legs. This can help the swelling go down.  Wear elastic bandages or support stockings as told by your doctor.  Eat a low-salt (low-sodium) diet to reduce fluid as told by your doctor.  Depending on the cause of your swelling, you may need to limit how much fluid you drink (fluid restriction).  Take over-the-counter and prescription medicines only as told by your doctor. Contact a doctor if:  Treatment is not working.  You have heart, liver, or kidney disease and have symptoms of edema.  You have sudden and unexplained weight gain. Get help right away if:  You have shortness of breath or chest pain.  You cannot breathe when you lie down.  You have pain, redness, or warmth in the swollen areas.  You have heart, liver, or kidney disease and get edema all of a sudden.  You have a fever and your symptoms get worse all of a sudden. Summary  Edema is when you have too much fluid in your body or under your skin.  Edema may make your legs,  feet, and ankles swell up. Swelling is also common in looser tissues, like around your eyes.  Raise (elevate) the swollen body part above the level of your heart when you are sitting or lying down.  Follow your doctor's instructions about diet and how much fluid you can drink (fluid restriction). This information is not intended to replace advice given to you by your health care provider. Make sure you discuss any questions you have with your health care provider. Document Released: 12/31/2007 Document Revised: 08/01/2016 Document Reviewed: 08/01/2016 Elsevier Interactive Patient Education  2017 [employer].     Stroke Prevention Some health problems and behaviors may make it more likely for you to have a stroke. Below are ways to lessen your risk of having a stroke.  Be active for at least 30 minutes on most or all days.  Do not smoke. Try not to be around others who smoke.  Do not drink too much alcohol. ? Do not have more than 2 drinks a day if you are a man. ? Do not have more than 1 drink a day if you are a woman and are not pregnant.  Eat healthy foods, such as fruits and vegetables. If you were put on a specific diet, follow the diet as told.  Keep your cholesterol levels under control through diet and medicines. Look for foods that are low  in saturated fat, trans fat, cholesterol, and are high in fiber.  If you have diabetes, follow all diet plans and take your medicine as told.  Ask your doctor if you need treatment to lower your blood pressure. If you have high blood pressure (hypertension), follow all diet plans and take your medicine as told by your doctor.  If you are 65-77 years old, have your blood pressure checked every 3-5 years. If you are age 49 or older, have your blood pressure checked every year.  Keep a healthy weight. Eat foods that are low in calories, salt, saturated fat, trans fat, and cholesterol.  Do not take drugs.  Avoid birth control pills, if  this applies. Talk to your doctor about the risks of taking birth control pills.  Talk to your doctor if you have sleep problems (sleep apnea).  Take all medicine as told by your doctor. ? You may be told to take aspirin or blood thinner medicine. Take this medicine as told by your doctor. ? Understand your medicine instructions.  Make sure any other conditions you have are being taken care of.  Get help right away if:  You suddenly lose feeling (you feel numb) or have weakness in your face, arm, or leg.  Your face or eyelid hangs down to one side.  You suddenly feel confused.  You have trouble talking (aphasia) or understanding what people are saying.  You suddenly have trouble seeing in one or both eyes.  You suddenly have trouble walking.  You are dizzy.  You lose your balance or your movements are clumsy (uncoordinated).  You suddenly have a very bad headache and you do not know the cause.  You have new chest pain.  Your heart feels like it is fluttering or skipping a beat (irregular heartbeat). Do not wait to see if the symptoms above go away. Get help right away. Call your local emergency services (911 in [place] Do not drive yourself to the hospital. This information is not intended to replace advice given to you by your health care provider. Make sure you discuss any questions you have with your health care provider. Document Released: 01/13/2012 Document Revised: 12/20/2015 Document Reviewed: 01/14/2013 Elsevier Interactive Patient Education  2018 [employer].     Peripheral Vascular Disease Peripheral vascular disease (PVD) is a disease of the blood vessels that are not part of your heart and brain. A simple term for PVD is poor circulation. In most cases, PVD narrows the blood vessels that carry blood from your heart to the rest of your body. This can result in a decreased supply of blood to your arms, legs, and internal organs, like your stomach or kidneys.  However, it most often affects a person's lower legs and feet. There are two types of PVD.  Organic PVD. This is the more common type. It is caused by damage to the structure of blood vessels.  Functional PVD. This is caused by conditions that make blood vessels contract and tighten (spasm).  Without treatment, PVD tends to get worse over time. PVD can also lead to acute ischemic limb. This is when an arm or limb suddenly has trouble getting enough blood. This is a medical emergency. Follow these instructions at home:  Take medicines only as told by your doctor.  Do not use any tobacco products, including cigarettes, chewing tobacco, or electronic cigarettes. If you need help quitting, ask your doctor.  Lose weight if you are overweight, and maintain a healthy weight as  told by your doctor.  Eat a diet that is low in fat and cholesterol. If you need help, ask your doctor.  Exercise regularly. Ask your doctor for some good activities for you.  Take good care of your feet. ? Wear comfortable shoes that fit well. ? Check your feet often for any cuts or sores. Contact a doctor if:  You have cramps in your legs while walking.  You have leg pain when you are at rest.  You have coldness in a leg or foot.  Your skin changes.  You are unable to get or have an erection (erectile dysfunction).  You have cuts or sores on your feet that are not healing. Get help right away if:  Your arm or leg turns cold and blue.  Your arms or legs become red, warm, swollen, painful, or numb.  You have chest pain or trouble breathing.  You suddenly have weakness in your face, arm, or leg.  You become very confused or you cannot speak.  You suddenly have a very bad headache.  You suddenly cannot see. This information is not intended to replace advice given to you by your health care provider. Make sure you discuss any questions you have with your health care provider. Document Released:  10/08/2009 Document Revised: 12/20/2015 Document Reviewed: 12/22/2013 Elsevier Interactive Patient Education  2017 [employer].

## 2018-04-08 NOTE — H&P (View-Only) (Signed)
[facility] [place] HISTORY AND PHYSICAL   CC: new ulcers on left foot, history of peripheral artery occlusive disease and dependent edema of lower legs, CHF   History of Present Illness:   Cynthia Ray is a 84 y.o. female who previously underwent right femoral to popliteal artery bypass and amputation of the distal tip of right first toe in August 2013 by Dr.Fields.She also has a left superficial femoral artery stent placed in 2004 and chronic occlusion of the distal superficial femoral artery and popliteal artery based on angiography.She ambulates with the assistance of a walker sometimes.   She returns today at the request of her podiatrist, Dr. Allene Pyo, to evaluate and address left foot ulcer at the base of her left metatarsus, medial aspect, and what appears to be abrasion type lesions at the tips pf all of her left toes and lateral aspect of he left foot. Pt daughter states Dr. Berline Lopes saw her yesterday.  Daughter states pt that both of pt's feet slide when she gets out of her recliner, and some of these abrasions happened in the last few days. Daughter states the ulcer at the base of her left great toe, medial aspect, was noticed in July 2019, after she got home from a SNF; she was there for rehab after surgery on 11-29-17 to repair right hip fracture after she fell.  Pt is home and receives physical therapy and [place] visits weekly.  Daughter states Cynthia Ray is managing dressing changes of left foot and daughter performs daily dressing changes to wounds of left foot with Silvadene.   She is also been followed for carotid stenosis. Shedeniesamaurosis fugax, sudden onset weakness or numbness in extremities and slurred speech.  Her past medical history includes hyperlipidemia, hypertension and diabetes which have all been stable.She is unable to take aspirin. She is on a statin. She is a former smoker.  She has CHF.   Pt was last evaluated on 07-31-16 by Dr. Oneida Alar  and K. Trinh PA-C. At that time the right lower extremity bypass graft was patent. She denied any issues with her legs bilaterally. Will follow-up in 1 year with repeat duplex and ABIs. The patient denies any TIA or stroke symptoms. Her carotid stenosis has been stable since her duplex last year. Follow-up in 1 year with repeat carotid duplex.   Daughter states pt sits in her recliner and does not elevate her legs, and eats too much salt.    Diabetic: Yes, she does not know her A1C, was 8.0 in 2016 Tobacco use: former smoker, quit in 1989, started in 1965  Pt meds include: Statin :Yes Betablocker: Yes ASA: Yes Other anticoagulants/antiplatelets: Plavix     Current Outpatient Medications  Medication Sig Dispense Refill  . amLODipine (NORVASC) 10 MG tablet TAKE ONE TABLET BY MOUTH DAILY. 90 tablet 1  . aspirin 81 MG chewable tablet Chew by mouth daily.    Marland Kitchen atorvastatin (LIPITOR) 80 MG tablet Take 80 mg by mouth daily.    . cholecalciferol (VITAMIN D) 1000 UNITS tablet Take 1,000 Units by mouth daily.    . clopidogrel (PLAVIX) 75 MG tablet TAKE ONE TABLET BY MOUTH DAILY. 90 tablet 1  . ferrous sulfate 325 (65 FE) MG tablet Take 325 mg by mouth 2 (two) times daily.    . furosemide (LASIX) 20 MG tablet Take 20 mg by mouth daily.  0  . glipiZIDE (GLUCOTROL) 5 MG tablet Take 5 mg by mouth 2 (two) times daily before a  meal.    . iron polysaccharides (NIFEREX) 150 MG capsule Take by mouth.    Marland Kitchen lisinopril (PRINIVIL,ZESTRIL) 40 MG tablet Take 1 tablet (40 mg total) by mouth daily. 30 tablet 6  . magnesium oxide (MAG-OX) 400 MG tablet Take 400 mg by mouth daily.    . metFORMIN (GLUCOPHAGE) 500 MG tablet Take 500 mg by mouth 3 (three) times daily.    . metoprolol tartrate (LOPRESSOR) 25 MG tablet Take 1 tablet (25 mg total) by mouth 2 (two) times daily. 180 tablet 3  . nitroGLYCERIN (NITROSTAT) 0.4 MG SL tablet Place 0.4 mg under the tongue every 5 (five) minutes as needed for chest pain.      Marland Kitchen omeprazole (PRILOSEC) 40 MG capsule Take 40 mg by mouth as needed.    . traMADol (ULTRAM) 50 MG tablet Take 50 mg by mouth 2 (two) times daily.     . vitamin C (ASCORBIC ACID) 500 MG tablet Take by mouth.     No current facility-administered medications for this visit.     Past Medical History:  Diagnosis Date  . Anemia   . Arthritis    "dr said I have it in my back" Lower   . Carotid artery occlusion   . CHF (congestive heart failure) ([place]) 12/2017  . COPD (chronic obstructive pulmonary disease) ([place])   . Fractured hip ([place])    Fell and hurt R hip  . GERD (gastroesophageal reflux disease)   . History of blood transfusion 11/11/11  . Hyperlipidemia   . Hypertension   . PVD (peripheral vascular disease) ([place])    Stents left SFA 2004 Dr. Albertine Patricia  . Type II diabetes mellitus ([place])     Social History Social History   Tobacco Use  . Smoking status: Former Smoker    Packs/day: 1.00    Years: 24.00    Pack years: 24.00    Types: Cigarettes    Start date: 07/29/1963    Last attempt to quit: 07/29/1987    Years since quitting: 30.7  . Smokeless tobacco: Never Used  Substance Use Topics  . Alcohol use: No    Alcohol/week: 0.0 standard drinks  . Drug use: No    Family History Family History  Problem Relation Age of Onset  . Stroke Father 73  . Hypertension Father   . Stroke Mother 72  . Diabetes Mother   . Hypertension Mother   . Diabetes Sister   . Hyperlipidemia Sister   . Hypertension Sister   . Diabetes Brother   . Hypertension Brother     Surgical History Past Surgical History:  Procedure Laterality Date  . ABDOMINAL AORTAGRAM N/A 10/31/2011   Procedure: ABDOMINAL Maxcine Ham;  Surgeon: Elam Dutch, MD;  Location: [facility] CATH LAB;  Service: Cardiovascular;  Laterality: N/A;  . AMPUTATION  03/23/2012   Procedure: AMPUTATION DIGIT;  Surgeon: Elam Dutch, MD;  Location: [facility] OR;  Service: Vascular;  Laterality: Right;  Right Femoral endarterectomy with  profundaplasty; right femoral-popliteal bypass with nonreversed saphenous vein; and right first toe amputation  . CATARACT EXTRACTION W/ INTRAOCULAR LENS  IMPLANT, BILATERAL  2002  . COLONOSCOPY    . CORONARY ANGIOPLASTY WITH STENT PLACEMENT  01/19/12   "1; first one I've ever had"  . EYE SURGERY  2002   Cataract  . EYE SURGERY  2009   Laser bilateral   . FEMORAL ENDARTERECTOMY  03/23/2012   right  . FEMORAL-POPLITEAL BYPASS GRAFT  03/23/2012   Procedure: BYPASS GRAFT FEMORAL-POPLITEAL ARTERY;  Surgeon: Elam Dutch, MD;  Location: [facility] OR;  Service: Vascular;  Laterality: Right;  Right Femoral endarterectomy with profundaplasty; right femoral-popliteal bypass with nonreversed saphenous vein; and right first toe amputation  . PERCUTANEOUS CORONARY STENT INTERVENTION (PCI-S) N/A 01/19/2012   Procedure: PERCUTANEOUS CORONARY STENT INTERVENTION (PCI-S);  Surgeon: Peter M Martinique, MD;  Location: [facility] CATH LAB;  Service: Cardiovascular;  Laterality: N/A;  . PERIPHERAL ARTERIAL STENT GRAFT  2003   LLE  . POSTERIOR LAMINECTOMY / DECOMPRESSION LUMBAR SPINE  1989  . REFRACTIVE SURGERY  2009   bilaterally  . TONSILLECTOMY AND ADENOIDECTOMY  1946    Allergies  Allergen Reactions  . Ibuprofen Other (See Comments)    Dr. Britta Mccreedy informed patient not to take this due to ulcer     Current Outpatient Medications  Medication Sig Dispense Refill  . amLODipine (NORVASC) 10 MG tablet TAKE ONE TABLET BY MOUTH DAILY. 90 tablet 1  . aspirin 81 MG chewable tablet Chew by mouth daily.    Marland Kitchen atorvastatin (LIPITOR) 80 MG tablet Take 80 mg by mouth daily.    . cholecalciferol (VITAMIN D) 1000 UNITS tablet Take 1,000 Units by mouth daily.    . clopidogrel (PLAVIX) 75 MG tablet TAKE ONE TABLET BY MOUTH DAILY. 90 tablet 1  . ferrous sulfate 325 (65 FE) MG tablet Take 325 mg by mouth 2 (two) times daily.    . furosemide (LASIX) 20 MG tablet Take 20 mg by mouth daily.  0  . glipiZIDE (GLUCOTROL) 5 MG tablet Take 5  mg by mouth 2 (two) times daily before a meal.    . iron polysaccharides (NIFEREX) 150 MG capsule Take by mouth.    Marland Kitchen lisinopril (PRINIVIL,ZESTRIL) 40 MG tablet Take 1 tablet (40 mg total) by mouth daily. 30 tablet 6  . magnesium oxide (MAG-OX) 400 MG tablet Take 400 mg by mouth daily.    . metFORMIN (GLUCOPHAGE) 500 MG tablet Take 500 mg by mouth 3 (three) times daily.    . metoprolol tartrate (LOPRESSOR) 25 MG tablet Take 1 tablet (25 mg total) by mouth 2 (two) times daily. 180 tablet 3  . nitroGLYCERIN (NITROSTAT) 0.4 MG SL tablet Place 0.4 mg under the tongue every 5 (five) minutes as needed for chest pain.    Marland Kitchen omeprazole (PRILOSEC) 40 MG capsule Take 40 mg by mouth as needed.    . traMADol (ULTRAM) 50 MG tablet Take 50 mg by mouth 2 (two) times daily.     . vitamin C (ASCORBIC ACID) 500 MG tablet Take by mouth.     No current facility-administered medications for this visit.      REVIEW OF SYSTEMS: See HPI for pertinent positives and negatives.  Physical Examination Vitals:   04/08/18 1158  BP: 124/62  Pulse: 80  Resp: 20  Temp: (!) 97.3 F (36.3 C)  TempSrc: Oral  SpO2: 95%  Weight: 133 lb 14.4 oz (60.7 kg)  Height: 5' (1.524 m)   Body mass index is 26.15 kg/m.  General:  WDWN elderly female NAD Gait: using walker, slow, deliberate HENT: No gross abnormalities Eyes: PERRLA Pulmonary: normal non-labored breathing, good air movement, +rales in right base, no rhonchi or wheezing. Cardiac: RRR, no murmur detected Abdomen: soft, NT, no masses palpated Skin: no rashes, See Extremities  VASCULAR EXAM  Carotid Bruits Right Left   Negative Negative      Radial pulses are 2+ palpable bilaterally   Adominal aortic pulse is not palpable  VASCULAR EXAM: Extremities without ischemic changes, without Gangrene; without open wounds. 2-3+ pitting edema in right foot and lower leg, 1-2+ in left foot and lower leg.  Small raised mass at right medial  mid/distal thigh that measures about 3.5 x 4 cm. Surgically absent distal tip of right great toe.  Ulcer at the base of her left first metatarsus, medial aspect, and abrasions appearing wounds at the tips of all of her left toes and lateral aspect left foot. See photos below.     Left foot    Left foot    Left foot                                                                                                                                                        LE Pulses Right Left       FEMORAL  1+ palpable  1+ palpable        POPLITEAL  not palpable   not palpable       POSTERIOR TIBIAL  not palpable   not palpable        DORSALIS PEDIS      ANTERIOR TIBIAL not palpable  not palpable     Musculoskeletal: no muscle wasting or atrophy. Moderate thoracic kyphosis.  Neurologic:  A&O X 3; appropriate affect, sensation is normal; speech is normal, CN 2-12 intact, pain and light touch intact in extremities, motor exam as listed above. Psychiatric: Normal mood and affect for clinical situation.      ASSESSMENT:  Cynthia Ray is a 84 y.o. female who is s/p right femoral to popliteal artery bypass and amputation of the distal tip of right first toe in August 2013 by Dr.Fields.She also has a left superficial femoral artery stent and chronic occlusion of the distal superficial femoral artery and popliteal artery based on angiography. She has no claudication symptoms with walking.  She has an ischemic ulcer at the base of her left first metatarsus, medial aspect, and abrasions appearing wounds at the tips of all of her left toes and lateral aspect left foot. By history from daughter, left foot ulcer was noted in July 2019.  Dr. Oneida Alar spoke with pt and daughter and examined pt. See Plan.  Pt states the lump noted on duplex at her right medial thigh, and noted on exam, has been there since the right leg arterial bypass graft, is a cystic structure on duplex.  She  has no hx of stroke or TIA.  Dependent edema in both lower legs and feet since she sits most of the day with her feet dependent; see Patient Instructions for elevation of legs.   DATA  Left LE Arterial Duplex (04-08-18): Left Duplex Findings: +-----------+--------+-----+---------------+----------+--------+       PSV cm/sRatioStenosis    Waveform Comments +-----------+--------+-----+---------------+----------+--------+ CFA Distal 102  monophasic     +-----------+--------+-----+---------------+----------+--------+ DFA    196              monophasic     +-----------+--------+-----+---------------+----------+--------+ SFA Prox  395      75-99% stenosismonophasic     +-----------+--------+-----+---------------+----------+--------+ SFA Mid  296      75-99% stenosismonophasic     +-----------+--------+-----+---------------+----------+--------+ SFA Distal 75              monophasic     +-----------+--------+-----+---------------+----------+--------+ POP Prox         occluded              +-----------+--------+-----+---------------+----------+--------+ ATA Distal 34              monophasic     +-----------+--------+-----+---------------+----------+--------+ PTA Distal 7               monophasic     +-----------+--------+-----+---------------+----------+--------+ PERO Distal10              monophasic     +-----------+--------+-----+---------------+----------+--------+  A focal velocity elevation of 395 cm/s was obtained at Origin SFA with post stenotic turbulence with a VR of 3.9. Findings are characteristic of 75-99% stenosis. A 2nd focal velocity elevation was visualized, measuring 296 cm/s at Prox/mid SFA with post  stenotic turbulence with a VR of 4.23. Findings are  characteristic of 75-99% stenosis. Final Interpretation: Left: Patent stent. There is stenosis at the origin of the SFA and in the stent in the mid thigh. The popliteal artery appears occluded.    Bilateral LE Arterial Duplex (08/06/17): A cursory look was given to the lower extremity venous system: no DVT. Cystic structure with no internal flow in the right medial mid/distal thigh region measuring approximately 3.3 x 3.9 cm. Patent right LE bypass graft with no restenosis, all biphasic waveforms.  Left proximal SFA stenosis at 50-99% with hx of stent placed, stent is poorly visualized due to calcified plaque.  No significant change compared to exam on 07-31-16.    ABI (Date: 04/08/2018):  R:   ABI: [place] (was [place] on 08-06-17),   PT: mono  DP: mono  TBI:  Right great toe tip surgically absent   L:   ABI:  (was ),   PT: not found  DP: dampened mono  TBI: ulceration   Carotid Duplex (08-06-17): 40-59% right internal carotid artery stenosis 1-39% left proximal internal carotid artery stenosis Bilateral vertebral artery flow is antegrade.  Bilateral subclavian artery waveforms are normal.  No significant change compared to the exam on 07-31-16.    PLAN:   Elevation of legs, daily seated leg exercises as discussed and demonstrated.  Based on today's exam, HPI, and non-invasive vascular lab results, the patient will be scheduled for an arteriogram with bilateral run off, possible intervention by Dr. Oneida Alar on 04-16-18.   I discussed in depth with the patient the nature of atherosclerosis, and emphasized the importance of maximal medical management including strict control of blood pressure, blood glucose, and lipid levels, obtaining regular exercise, and cessation of smoking.  The patient is aware that without maximal medical management the underlying atherosclerotic disease process will progress, limiting the benefit of any interventions.  The patient was given information  about stroke prevention and what symptoms should prompt the patient to seek immediate medical care.  The patient was given information about PAD including signs, symptoms, treatment, what symptoms should prompt the patient to seek immediate medical care, and risk reduction measures to take.  Thank you for allowing [place]  to participate in this patient's care.  Clemon Chambers, RN, MSN, FNP-C [facility] Office: [phone]  Clinic MD: Oneida Alar 04/08/2018 12:20 PM

## 2018-04-08 NOTE — H&P (View-Only) (Signed)
[facility] [place] HISTORY AND PHYSICAL   CC: new ulcers on left foot, history of peripheral artery occlusive disease and dependent edema of lower legs, CHF   History of Present Illness:   Cynthia Ray is a 84 y.o. female who previously underwent right femoral to popliteal artery bypass and amputation of the distal tip of right first toe in August 2013 by Dr.Fields.She also has a left superficial femoral artery stent placed in 2004 and chronic occlusion of the distal superficial femoral artery and popliteal artery based on angiography.She ambulates with the assistance of a walker sometimes.   She returns today at the request of her podiatrist, Dr. Allene Pyo, to evaluate and address left foot ulcer at the base of her left metatarsus, medial aspect, and what appears to be abrasion type lesions at the tips pf all of her left toes and lateral aspect of he left foot. Pt daughter states Dr. Berline Lopes saw her yesterday.  Daughter states pt that both of pt's feet slide when she gets out of her recliner, and some of these abrasions happened in the last few days. Daughter states the ulcer at the base of her left great toe, medial aspect, was noticed in July 2019, after she got home from a SNF; she was there for rehab after surgery on 11-29-17 to repair right hip fracture after she fell.  Pt is home and receives physical therapy and [place] visits weekly.  Daughter states [place] is managing dressing changes of left foot and daughter performs daily dressing changes to wounds of left foot with Silvadene.   She is also been followed for carotid stenosis. Shedeniesamaurosis fugax, sudden onset weakness or numbness in extremities and slurred speech.  Her past medical history includes hyperlipidemia, hypertension and diabetes which have all been stable.She is unable to take aspirin. She is on a statin. She is a former smoker.  She has CHF.   Pt was last evaluated on 07-31-16 by Dr. Oneida Alar  and K. Trinh PA-C. At that time the right lower extremity bypass graft was patent. She denied any issues with her legs bilaterally. Will follow-up in 1 year with repeat duplex and ABIs. The patient denies any TIA or stroke symptoms. Her carotid stenosis has been stable since her duplex last year. Follow-up in 1 year with repeat carotid duplex.   Daughter states pt sits in her recliner and does not elevate her legs, and eats too much salt.    Diabetic: Yes, she does not know her A1C, was 8.0 in 2016 Tobacco use: former smoker, quit in 1989, started in 1965  Pt meds include: Statin :Yes Betablocker: Yes ASA: Yes Other anticoagulants/antiplatelets: Plavix     Current Outpatient Medications  Medication Sig Dispense Refill  . amLODipine (NORVASC) 10 MG tablet TAKE ONE TABLET BY MOUTH DAILY. 90 tablet 1  . aspirin 81 MG chewable tablet Chew by mouth daily.    Marland Kitchen atorvastatin (LIPITOR) 80 MG tablet Take 80 mg by mouth daily.    . cholecalciferol (VITAMIN D) 1000 UNITS tablet Take 1,000 Units by mouth daily.    . clopidogrel (PLAVIX) 75 MG tablet TAKE ONE TABLET BY MOUTH DAILY. 90 tablet 1  . ferrous sulfate 325 (65 FE) MG tablet Take 325 mg by mouth 2 (two) times daily.    . furosemide (LASIX) 20 MG tablet Take 20 mg by mouth daily.  0  . glipiZIDE (GLUCOTROL) 5 MG tablet Take 5 mg by mouth 2 (two) times daily before a  meal.    . iron polysaccharides (NIFEREX) 150 MG capsule Take by mouth.    Marland Kitchen lisinopril (PRINIVIL,ZESTRIL) 40 MG tablet Take 1 tablet (40 mg total) by mouth daily. 30 tablet 6  . magnesium oxide (MAG-OX) 400 MG tablet Take 400 mg by mouth daily.    . metFORMIN (GLUCOPHAGE) 500 MG tablet Take 500 mg by mouth 3 (three) times daily.    . metoprolol tartrate (LOPRESSOR) 25 MG tablet Take 1 tablet (25 mg total) by mouth 2 (two) times daily. 180 tablet 3  . nitroGLYCERIN (NITROSTAT) 0.4 MG SL tablet Place 0.4 mg under the tongue every 5 (five) minutes as needed for chest pain.      Marland Kitchen omeprazole (PRILOSEC) 40 MG capsule Take 40 mg by mouth as needed.    . traMADol (ULTRAM) 50 MG tablet Take 50 mg by mouth 2 (two) times daily.     . vitamin C (ASCORBIC ACID) 500 MG tablet Take by mouth.     No current facility-administered medications for this visit.     Past Medical History:  Diagnosis Date  . Anemia   . Arthritis    "dr said I have it in my back" Lower   . Carotid artery occlusion   . CHF (congestive heart failure) ([place]) 12/2017  . COPD (chronic obstructive pulmonary disease) ([place])   . Fractured hip ([place])    Fell and hurt R hip  . GERD (gastroesophageal reflux disease)   . History of blood transfusion 11/11/11  . Hyperlipidemia   . Hypertension   . PVD (peripheral vascular disease) ([place])    Stents left SFA 2004 Dr. Albertine Patricia  . Type II diabetes mellitus ([place])     Social History Social History   Tobacco Use  . Smoking status: Former Smoker    Packs/day: 1.00    Years: 24.00    Pack years: 24.00    Types: Cigarettes    Start date: 07/29/1963    Last attempt to quit: 07/29/1987    Years since quitting: 30.7  . Smokeless tobacco: Never Used  Substance Use Topics  . Alcohol use: No    Alcohol/week: 0.0 standard drinks  . Drug use: No    Family History Family History  Problem Relation Age of Onset  . Stroke Father 54  . Hypertension Father   . Stroke Mother 50  . Diabetes Mother   . Hypertension Mother   . Diabetes Sister   . Hyperlipidemia Sister   . Hypertension Sister   . Diabetes Brother   . Hypertension Brother     Surgical History Past Surgical History:  Procedure Laterality Date  . ABDOMINAL AORTAGRAM N/A 10/31/2011   Procedure: ABDOMINAL Maxcine Ham;  Surgeon: Elam Dutch, MD;  Location: [facility] CATH LAB;  Service: Cardiovascular;  Laterality: N/A;  . AMPUTATION  03/23/2012   Procedure: AMPUTATION DIGIT;  Surgeon: Elam Dutch, MD;  Location: [facility] OR;  Service: Vascular;  Laterality: Right;  Right Femoral endarterectomy with  profundaplasty; right femoral-popliteal bypass with nonreversed saphenous vein; and right first toe amputation  . CATARACT EXTRACTION W/ INTRAOCULAR LENS  IMPLANT, BILATERAL  2002  . COLONOSCOPY    . CORONARY ANGIOPLASTY WITH STENT PLACEMENT  01/19/12   "1; first one I've ever had"  . EYE SURGERY  2002   Cataract  . EYE SURGERY  2009   Laser bilateral   . FEMORAL ENDARTERECTOMY  03/23/2012   right  . FEMORAL-POPLITEAL BYPASS GRAFT  03/23/2012   Procedure: BYPASS GRAFT FEMORAL-POPLITEAL ARTERY;  Surgeon: Elam Dutch, MD;  Location: [facility] OR;  Service: Vascular;  Laterality: Right;  Right Femoral endarterectomy with profundaplasty; right femoral-popliteal bypass with nonreversed saphenous vein; and right first toe amputation  . PERCUTANEOUS CORONARY STENT INTERVENTION (PCI-S) N/A 01/19/2012   Procedure: PERCUTANEOUS CORONARY STENT INTERVENTION (PCI-S);  Surgeon: Peter M Martinique, MD;  Location: [facility] CATH LAB;  Service: Cardiovascular;  Laterality: N/A;  . PERIPHERAL ARTERIAL STENT GRAFT  2003   LLE  . POSTERIOR LAMINECTOMY / DECOMPRESSION LUMBAR SPINE  1989  . REFRACTIVE SURGERY  2009   bilaterally  . TONSILLECTOMY AND ADENOIDECTOMY  1946    Allergies  Allergen Reactions  . Ibuprofen Other (See Comments)    Dr. Britta Mccreedy informed patient not to take this due to ulcer     Current Outpatient Medications  Medication Sig Dispense Refill  . amLODipine (NORVASC) 10 MG tablet TAKE ONE TABLET BY MOUTH DAILY. 90 tablet 1  . aspirin 81 MG chewable tablet Chew by mouth daily.    Marland Kitchen atorvastatin (LIPITOR) 80 MG tablet Take 80 mg by mouth daily.    . cholecalciferol (VITAMIN D) 1000 UNITS tablet Take 1,000 Units by mouth daily.    . clopidogrel (PLAVIX) 75 MG tablet TAKE ONE TABLET BY MOUTH DAILY. 90 tablet 1  . ferrous sulfate 325 (65 FE) MG tablet Take 325 mg by mouth 2 (two) times daily.    . furosemide (LASIX) 20 MG tablet Take 20 mg by mouth daily.  0  . glipiZIDE (GLUCOTROL) 5 MG tablet Take 5  mg by mouth 2 (two) times daily before a meal.    . iron polysaccharides (NIFEREX) 150 MG capsule Take by mouth.    Marland Kitchen lisinopril (PRINIVIL,ZESTRIL) 40 MG tablet Take 1 tablet (40 mg total) by mouth daily. 30 tablet 6  . magnesium oxide (MAG-OX) 400 MG tablet Take 400 mg by mouth daily.    . metFORMIN (GLUCOPHAGE) 500 MG tablet Take 500 mg by mouth 3 (three) times daily.    . metoprolol tartrate (LOPRESSOR) 25 MG tablet Take 1 tablet (25 mg total) by mouth 2 (two) times daily. 180 tablet 3  . nitroGLYCERIN (NITROSTAT) 0.4 MG SL tablet Place 0.4 mg under the tongue every 5 (five) minutes as needed for chest pain.    Marland Kitchen omeprazole (PRILOSEC) 40 MG capsule Take 40 mg by mouth as needed.    . traMADol (ULTRAM) 50 MG tablet Take 50 mg by mouth 2 (two) times daily.     . vitamin C (ASCORBIC ACID) 500 MG tablet Take by mouth.     No current facility-administered medications for this visit.      REVIEW OF SYSTEMS: See HPI for pertinent positives and negatives.  Physical Examination Vitals:   04/08/18 1158  BP: 124/62  Pulse: 80  Resp: 20  Temp: (!) 97.3 F (36.3 C)  TempSrc: Oral  SpO2: 95%  Weight: 133 lb 14.4 oz (60.7 kg)  Height: 5' (1.524 m)   Body mass index is 26.15 kg/m.  General:  WDWN elderly female NAD Gait: using walker, slow, deliberate HENT: No gross abnormalities Eyes: PERRLA Pulmonary: normal non-labored breathing, good air movement, +rales in right base, no rhonchi or wheezing. Cardiac: RRR, no murmur detected Abdomen: soft, NT, no masses palpated Skin: no rashes, See Extremities  VASCULAR EXAM  Carotid Bruits Right Left   Negative Negative      Radial pulses are 2+ palpable bilaterally   Adominal aortic pulse is not palpable  VASCULAR EXAM: Extremities without ischemic changes, without Gangrene; without open wounds. 2-3+ pitting edema in right foot and lower leg, 1-2+ in left foot and lower leg.  Small raised mass at right medial  mid/distal thigh that measures about 3.5 x 4 cm. Surgically absent distal tip of right great toe.  Ulcer at the base of her left first metatarsus, medial aspect, and abrasions appearing wounds at the tips of all of her left toes and lateral aspect left foot. See photos below.     Left foot    Left foot    Left foot                                                                                                                                                        LE Pulses Right Left       FEMORAL  1+ palpable  1+ palpable        POPLITEAL  not palpable   not palpable       POSTERIOR TIBIAL  not palpable   not palpable        DORSALIS PEDIS      ANTERIOR TIBIAL not palpable  not palpable     Musculoskeletal: no muscle wasting or atrophy. Moderate thoracic kyphosis.  Neurologic:  A&O X 3; appropriate affect, sensation is normal; speech is normal, CN 2-12 intact, pain and light touch intact in extremities, motor exam as listed above. Psychiatric: Normal mood and affect for clinical situation.      ASSESSMENT:  LENAE WHERLEY is a 84 y.o. female who is s/p right femoral to popliteal artery bypass and amputation of the distal tip of right first toe in August 2013 by Dr.Fields.She also has a left superficial femoral artery stent and chronic occlusion of the distal superficial femoral artery and popliteal artery based on angiography. She has no claudication symptoms with walking.  She has an ischemic ulcer at the base of her left first metatarsus, medial aspect, and abrasions appearing wounds at the tips of all of her left toes and lateral aspect left foot. By history from daughter, left foot ulcer was noted in July 2019.  Dr. Oneida Alar spoke with pt and daughter and examined pt. See Plan.  Pt states the lump noted on duplex at her right medial thigh, and noted on exam, has been there since the right leg arterial bypass graft, is a cystic structure on duplex.  She  has no hx of stroke or TIA.  Dependent edema in both lower legs and feet since she sits most of the day with her feet dependent; see Patient Instructions for elevation of legs.   DATA  Left LE Arterial Duplex (04-08-18): Left Duplex Findings: +-----------+--------+-----+---------------+----------+--------+       PSV cm/sRatioStenosis    Waveform Comments +-----------+--------+-----+---------------+----------+--------+ CFA Distal 102  monophasic     +-----------+--------+-----+---------------+----------+--------+ DFA    196              monophasic     +-----------+--------+-----+---------------+----------+--------+ SFA Prox  395      75-99% stenosismonophasic     +-----------+--------+-----+---------------+----------+--------+ SFA Mid  296      75-99% stenosismonophasic     +-----------+--------+-----+---------------+----------+--------+ SFA Distal 75              monophasic     +-----------+--------+-----+---------------+----------+--------+ POP Prox         occluded              +-----------+--------+-----+---------------+----------+--------+ ATA Distal 34              monophasic     +-----------+--------+-----+---------------+----------+--------+ PTA Distal 7               monophasic     +-----------+--------+-----+---------------+----------+--------+ PERO Distal10              monophasic     +-----------+--------+-----+---------------+----------+--------+  A focal velocity elevation of 395 cm/s was obtained at Origin SFA with post stenotic turbulence with a VR of 3.9. Findings are characteristic of 75-99% stenosis. A 2nd focal velocity elevation was visualized, measuring 296 cm/s at Prox/mid SFA with post  stenotic turbulence with a VR of 4.23. Findings are  characteristic of 75-99% stenosis. Final Interpretation: Left: Patent stent. There is stenosis at the origin of the SFA and in the stent in the mid thigh. The popliteal artery appears occluded.    Bilateral LE Arterial Duplex (08/06/17): A cursory look was given to the lower extremity venous system: no DVT. Cystic structure with no internal flow in the right medial mid/distal thigh region measuring approximately 3.3 x 3.9 cm. Patent right LE bypass graft with no restenosis, all biphasic waveforms.  Left proximal SFA stenosis at 50-99% with hx of stent placed, stent is poorly visualized due to calcified plaque.  No significant change compared to exam on 07-31-16.    ABI (Date: 04/08/2018):  R:   ABI: [place] (was [place] on 08-06-17),   PT: mono  DP: mono  TBI:  Right great toe tip surgically absent   L:   ABI: N[place] (was [place]),   PT: not found  DP: dampened mono  TBI: ulceration   Carotid Duplex (08-06-17): 40-59% right internal carotid artery stenosis 1-39% left proximal internal carotid artery stenosis Bilateral vertebral artery flow is antegrade.  Bilateral subclavian artery waveforms are normal.  No significant change compared to the exam on 07-31-16.    PLAN:   Elevation of legs, daily seated leg exercises as discussed and demonstrated.  Based on today's exam, HPI, and non-invasive vascular lab results, the patient will be scheduled for an arteriogram with bilateral run off, possible intervention by Dr. Oneida Alar on 04-16-18.   I discussed in depth with the patient the nature of atherosclerosis, and emphasized the importance of maximal medical management including strict control of blood pressure, blood glucose, and lipid levels, obtaining regular exercise, and cessation of smoking.  The patient is aware that without maximal medical management the underlying atherosclerotic disease process will progress, limiting the benefit of any interventions.  The patient was given information  about stroke prevention and what symptoms should prompt the patient to seek immediate medical care.  The patient was given information about PAD including signs, symptoms, treatment, what symptoms should prompt the patient to seek immediate medical care, and risk reduction measures to take.  Thank you for allowing [place]  to participate in this patient's care.  Clemon Chambers, RN, MSN, FNP-C [facility] Office: [phone]  Clinic MD: Oneida Alar 04/08/2018 12:20 PM

## 2018-04-12 DIAGNOSIS — L97521 Non-pressure chronic ulcer of other part of left foot limited to breakdown of skin: Secondary | ICD-10-CM | POA: Diagnosis not present

## 2018-04-12 DIAGNOSIS — E11621 Type 2 diabetes mellitus with foot ulcer: Secondary | ICD-10-CM | POA: Diagnosis not present

## 2018-04-12 DIAGNOSIS — I5041 Acute combined systolic (congestive) and diastolic (congestive) heart failure: Secondary | ICD-10-CM | POA: Diagnosis not present

## 2018-04-12 DIAGNOSIS — S72001D Fracture of unspecified part of neck of right femur, subsequent encounter for closed fracture with routine healing: Secondary | ICD-10-CM | POA: Diagnosis not present

## 2018-04-12 DIAGNOSIS — I2510 Atherosclerotic heart disease of native coronary artery without angina pectoris: Secondary | ICD-10-CM | POA: Diagnosis not present

## 2018-04-12 DIAGNOSIS — I110 Hypertensive heart disease with heart failure: Secondary | ICD-10-CM | POA: Diagnosis not present

## 2018-04-14 DIAGNOSIS — S72001D Fracture of unspecified part of neck of right femur, subsequent encounter for closed fracture with routine healing: Secondary | ICD-10-CM | POA: Diagnosis not present

## 2018-04-14 DIAGNOSIS — I5041 Acute combined systolic (congestive) and diastolic (congestive) heart failure: Secondary | ICD-10-CM | POA: Diagnosis not present

## 2018-04-14 DIAGNOSIS — I110 Hypertensive heart disease with heart failure: Secondary | ICD-10-CM | POA: Diagnosis not present

## 2018-04-14 DIAGNOSIS — E11621 Type 2 diabetes mellitus with foot ulcer: Secondary | ICD-10-CM | POA: Diagnosis not present

## 2018-04-14 DIAGNOSIS — I2510 Atherosclerotic heart disease of native coronary artery without angina pectoris: Secondary | ICD-10-CM | POA: Diagnosis not present

## 2018-04-14 DIAGNOSIS — L97521 Non-pressure chronic ulcer of other part of left foot limited to breakdown of skin: Secondary | ICD-10-CM | POA: Diagnosis not present

## 2018-04-16 ENCOUNTER — Other Ambulatory Visit: Payer: Self-pay | Admitting: *Deleted

## 2018-04-16 ENCOUNTER — Encounter (HOSPITAL_COMMUNITY)
Admission: RE | Disposition: A | Discharge status: Home / Self Care | Payer: Self-pay | Source: Ambulatory Visit | Attending: Vascular Surgery

## 2018-04-16 ENCOUNTER — Ambulatory Visit (HOSPITAL_COMMUNITY)
Admission: RE | Admit: 2018-04-16 | Discharge: 2018-04-16 | Disposition: A | Discharge status: Home / Self Care | Payer: Medicare Other | Source: Ambulatory Visit | Attending: Vascular Surgery | Admitting: Vascular Surgery

## 2018-04-16 ENCOUNTER — Encounter (HOSPITAL_COMMUNITY): Payer: Self-pay | Admitting: Vascular Surgery

## 2018-04-16 ENCOUNTER — Other Ambulatory Visit: Payer: Self-pay

## 2018-04-16 DIAGNOSIS — E785 Hyperlipidemia, unspecified: Secondary | ICD-10-CM | POA: Diagnosis not present

## 2018-04-16 DIAGNOSIS — K219 Gastro-esophageal reflux disease without esophagitis: Secondary | ICD-10-CM | POA: Diagnosis not present

## 2018-04-16 DIAGNOSIS — I509 Heart failure, unspecified: Secondary | ICD-10-CM | POA: Diagnosis not present

## 2018-04-16 DIAGNOSIS — E11621 Type 2 diabetes mellitus with foot ulcer: Secondary | ICD-10-CM | POA: Diagnosis not present

## 2018-04-16 DIAGNOSIS — I110 Hypertensive heart disease with heart failure: Secondary | ICD-10-CM | POA: Diagnosis not present

## 2018-04-16 DIAGNOSIS — Z7982 Long term (current) use of aspirin: Secondary | ICD-10-CM | POA: Diagnosis not present

## 2018-04-16 DIAGNOSIS — Z8249 Family history of ischemic heart disease and other diseases of the circulatory system: Secondary | ICD-10-CM | POA: Insufficient documentation

## 2018-04-16 DIAGNOSIS — M1990 Unspecified osteoarthritis, unspecified site: Secondary | ICD-10-CM | POA: Insufficient documentation

## 2018-04-16 DIAGNOSIS — L97529 Non-pressure chronic ulcer of other part of left foot with unspecified severity: Secondary | ICD-10-CM | POA: Insufficient documentation

## 2018-04-16 DIAGNOSIS — Z7984 Long term (current) use of oral hypoglycemic drugs: Secondary | ICD-10-CM | POA: Insufficient documentation

## 2018-04-16 DIAGNOSIS — J449 Chronic obstructive pulmonary disease, unspecified: Secondary | ICD-10-CM | POA: Diagnosis not present

## 2018-04-16 DIAGNOSIS — I6523 Occlusion and stenosis of bilateral carotid arteries: Secondary | ICD-10-CM | POA: Insufficient documentation

## 2018-04-16 DIAGNOSIS — Z87891 Personal history of nicotine dependence: Secondary | ICD-10-CM | POA: Diagnosis not present

## 2018-04-16 DIAGNOSIS — Z955 Presence of coronary angioplasty implant and graft: Secondary | ICD-10-CM | POA: Diagnosis not present

## 2018-04-16 DIAGNOSIS — I70245 Atherosclerosis of native arteries of left leg with ulceration of other part of foot: Secondary | ICD-10-CM | POA: Insufficient documentation

## 2018-04-16 DIAGNOSIS — I70244 Atherosclerosis of native arteries of left leg with ulceration of heel and midfoot: Secondary | ICD-10-CM | POA: Diagnosis not present

## 2018-04-16 DIAGNOSIS — E1151 Type 2 diabetes mellitus with diabetic peripheral angiopathy without gangrene: Secondary | ICD-10-CM | POA: Diagnosis not present

## 2018-04-16 HISTORY — PX: ABDOMINAL AORTOGRAM W/LOWER EXTREMITY: CATH118223

## 2018-04-16 LAB — POCT I-STAT, CHEM 8
BUN: 20 mg/dL (ref 8–23)
Calcium, Ion: 1.15 mmol/L (ref 1.15–1.40)
Chloride: 95 mmol/L — ABNORMAL LOW (ref 98–111)
Creatinine, Ser: 1.2 mg/dL — ABNORMAL HIGH (ref 0.44–1.00)
Glucose, Bld: 107 mg/dL — ABNORMAL HIGH (ref 70–99)
HCT: 31 % — ABNORMAL LOW (ref 36.0–46.0)
Hemoglobin: 10.5 g/dL — ABNORMAL LOW (ref 12.0–15.0)
Potassium: 4.2 mmol/L (ref 3.5–5.1)
Sodium: 135 mmol/L (ref 135–145)
TCO2: 31 mmol/L (ref 22–32)

## 2018-04-16 LAB — GLUCOSE, CAPILLARY: Glucose-Capillary: 143 mg/dL — ABNORMAL HIGH (ref 70–99)

## 2018-04-16 SURGERY — ABDOMINAL AORTOGRAM W/LOWER EXTREMITY
Anesthesia: LOCAL

## 2018-04-16 MED ORDER — SODIUM CHLORIDE 0.9 % IV SOLN: 250.0000 mL | INTRAVENOUS | Status: DC | PRN

## 2018-04-16 MED ORDER — OXYCODONE HCL 5 MG PO TABS: 5.0000 mg | ORAL_TABLET | ORAL | Status: DC | PRN

## 2018-04-16 MED ORDER — MORPHINE SULFATE (PF) 10 MG/ML IV SOLN: 1.0000 mg | INTRAVENOUS | Status: DC | PRN

## 2018-04-16 MED ORDER — SODIUM CHLORIDE 0.9% FLUSH: 3.0000 mL | Freq: Two times a day (BID) | INTRAVENOUS | Status: DC

## 2018-04-16 MED ORDER — HEPARIN (PORCINE) IN NACL 1000-0.9 UT/500ML-% IV SOLN
INTRAVENOUS | Status: AC
  Filled 2018-04-16: qty 1000

## 2018-04-16 MED ORDER — LIDOCAINE HCL (PF) 1 % IJ SOLN
INTRAMUSCULAR | Status: AC
  Filled 2018-04-16: qty 30

## 2018-04-16 MED ORDER — HEPARIN (PORCINE) IN NACL 1000-0.9 UT/500ML-% IV SOLN
INTRAVENOUS | Status: DC | PRN
  Administered 2018-04-16 (×2): 500 mL

## 2018-04-16 MED ORDER — SODIUM CHLORIDE 0.9 % IV SOLN: INTRAVENOUS | Status: AC

## 2018-04-16 MED ORDER — ONDANSETRON HCL 4 MG/2ML IJ SOLN: 4.0000 mg | Freq: Four times a day (QID) | INTRAMUSCULAR | Status: DC | PRN

## 2018-04-16 MED ORDER — LIDOCAINE HCL (PF) 1 % IJ SOLN
INTRAMUSCULAR | Status: DC | PRN
  Administered 2018-04-16: 15 mL

## 2018-04-16 MED ORDER — SODIUM CHLORIDE 0.9 % IV SOLN
INTRAVENOUS | Status: DC
  Administered 2018-04-16: 07:00:00 via INTRAVENOUS

## 2018-04-16 MED ORDER — ACETAMINOPHEN 325 MG PO TABS: 650.0000 mg | ORAL_TABLET | ORAL | Status: DC | PRN

## 2018-04-16 MED ORDER — HYDRALAZINE HCL 20 MG/ML IJ SOLN: 5.0000 mg | INTRAMUSCULAR | Status: DC | PRN

## 2018-04-16 MED ORDER — IODIXANOL 320 MG/ML IV SOLN
INTRAVENOUS | Status: DC | PRN
  Administered 2018-04-16: 105 mL via INTRA_ARTERIAL

## 2018-04-16 MED ORDER — SODIUM CHLORIDE 0.9% FLUSH: 3.0000 mL | INTRAVENOUS | Status: DC | PRN

## 2018-04-16 MED ORDER — LABETALOL HCL 5 MG/ML IV SOLN: 10.0000 mg | INTRAVENOUS | Status: DC | PRN

## 2018-04-16 SURGICAL SUPPLY — 11 items
CATH ANGIO 5F PIGTAIL 65CM (CATHETERS) ×2 IMPLANT
CATH BEACON 5 .035 65 KMP TIP (CATHETERS) ×2 IMPLANT
KIT MICROPUNCTURE NIT STIFF (SHEATH) ×2 IMPLANT
KIT PV (KITS) ×2 IMPLANT
SHEATH PINNACLE 5F 10CM (SHEATH) ×2 IMPLANT
SHEATH PROBE COVER 6X72 (BAG) ×2 IMPLANT
SYR MEDRAD MARK V 150ML (SYRINGE) ×2 IMPLANT
TRANSDUCER W/STOPCOCK (MISCELLANEOUS) ×2 IMPLANT
TRAY PV CATH (CUSTOM PROCEDURE TRAY) ×2 IMPLANT
WIRE BENTSON .035X145CM (WIRE) ×1 IMPLANT
WIRE HITORQ VERSACORE ST 145CM (WIRE) ×2 IMPLANT

## 2018-04-16 NOTE — Op Note (Signed)
Procedure: Abdominal aortogram with bilateral lower extremity runoff, ultrasound left groin  Preoperative diagnosis: Nonhealing wound left foot  Postoperative diagnosis: Same  Anesthesia: Local  Operative findings: #1 subtotal occlusion left distal common femoral artery #2 occlusion left superficial femoral and popliteal artery #3 one-vessel anterior tibial runoff to the left foot  4.  Patent right femoral to below-knee popliteal bypass with one-vessel runoff by the anterior tibial artery  Operative details: After obtaining informed consent, patient was taken to the [place] lab.  The patient was placed in supine position Angio table.  Both groins were prepped and draped in usual sterile fashion.  Local anesthesia was inflated of the left common femoral artery.  Ultrasound was used to identify the left common femoral artery and femoral bifurcation.  These were patent.  A permanent image was obtained to be placed in the patient's medical record.  Access to the patient's left common femoral artery was fairly difficult due to calcification and common femoral artery plaque.  After several attempts I was able to cannulate the left common femoral successfully using ultrasound guidance.  I was able to advance an 035 versacore wire up into the mid left common iliac artery.  A 5 French sheath was placed over the guidewire and left common femoral artery.  This was thoroughly flushed with heparinized saline.  A 5 [place] KMP catheter was advanced for extra-support into the iliac system.  I was then able to direct the guidewire up into the abdominal aorta.  The KMP catheter was removed and exchanged for a 5 French pigtail catheter.  Abdominal aortogram was then obtained in AP projection.  Left and right renal arteries are patent.  The infrarenal abdominal aorta is calcified but patent.  The left and right common external and internal iliac arteries are all heavily calcified but patent.  Next bilateral lower extremity  runoff views were obtained through the pigtail catheter.  In the left lower extremity, the left common femoral artery is narrowed but patent the femoral bifurcation has calcified plaque which causes subtotal occlusion of the origins of the SFA and profunda.  The left superficial femoral artery is occluded throughout its course.  Left popliteal artery is occluded.  There is a one-vessel runoff via the anterior tibial artery to the left foot.  In the right extremity, the right common femoral profunda femoris is patent.  There is a right femoral to below-knee popliteal bypass with one-vessel runoff via the anterior tibial artery which is all patent.  At this point the pigtail catheter was removed over guidewire.  A lateral foot view was obtained to confirm the above findings.  This shows a patent distal anterior tibial artery and dorsalis pedis artery as the sole runoff vessel to the left foot.  There is not a complete plantar arch.  Para at this point the procedure was concluded.  A 5 French sheath was left in place to be pulled in the holding area.  The patient tired procedure well and there were no complications.  Operative management: The patient will be scheduled in the near future for a left femoral to anterior tibial artery bypass with left femoral endarterectomy.  We will most likely need to admit the patient several days prior to her procedure for diuresis to decrease the edema in her lower extremities prior to operation.  Ruta Hinds, MD [facility] [place] Office: [phone] Pager: [phone]

## 2018-04-16 NOTE — Interval H&P Note (Signed)
History and Physical Interval Note:  04/16/2018 8:27 AM  Cynthia Ray  has presented today for surgery, with the diagnosis of pvd w/ulcer left lower ext.  The various methods of treatment have been discussed with the patient and family. After consideration of risks, benefits and other options for treatment, the patient has consented to  Procedure(s): ABDOMINAL AORTOGRAM W/LOWER EXTREMITY (N/A) as a surgical intervention .  The patient's history has been reviewed, patient examined, no change in status, stable for surgery.  I have reviewed the patient's chart and labs.  Questions were answered to the patient's satisfaction.     Ruta Hinds

## 2018-04-16 NOTE — Discharge Instructions (Signed)
Hold Metformin 48 hours! May resume 9/22 pm  Femoral Site Care Refer to this sheet in the next few weeks. These instructions provide you with information about caring for yourself after your procedure. Your health care provider may also give you more specific instructions. Your treatment has been planned according to current medical practices, but problems sometimes occur. Call your health care provider if you have any problems or questions after your procedure. What can I expect after the procedure? After your procedure, it is typical to have the following:  Bruising at the site that usually fades within 1-2 weeks.  Blood collecting in the tissue (hematoma) that may be painful to the touch. It should usually decrease in size and tenderness within 1-2 weeks.  Follow these instructions at home:  Take medicines only as directed by your health care provider.  You may shower 24-48 hours after the procedure or as directed by your health care provider. Remove the bandage (dressing) and gently wash the site with plain soap and water. Pat the area dry with a clean towel. Do not rub the site, because this may cause bleeding.  Do not take baths, swim, or use a hot tub until your health care provider approves.  Check your insertion site every day for redness, swelling, or drainage.  Do not apply powder or lotion to the site.  Limit use of stairs to twice a day for the first 2-3 days or as directed by your health care provider.  Do not squat for the first 2-3 days or as directed by your health care provider.  Do not lift over 10 lb (4.5 kg) for 5 days after your procedure or as directed by your health care provider.  Ask your health care provider when it is okay to: ? Return to work or school. ? Resume usual physical activities or sports. ? Resume sexual activity.  Do not drive home if you are discharged the same day as the procedure. Have someone else drive you.  You may drive 24 hours after  the procedure unless otherwise instructed by your health care provider.  Do not operate machinery or power tools for 24 hours after the procedure or as directed by your health care provider.  If your procedure was done as an outpatient procedure, which means that you went home the same day as your procedure, a responsible adult should be with you for the first 24 hours after you arrive home.  Keep all follow-up visits as directed by your health care provider. This is important. Contact a health care provider if:  You have a fever.  You have chills.  You have increased bleeding from the site. Hold pressure on the site. Get help right away if:  You have unusual pain at the site.  You have redness, warmth, or swelling at the site.  You have drainage (other than a small amount of blood on the dressing) from the site.  The site is bleeding, and the bleeding does not stop after 30 minutes of holding steady pressure on the site.  Your leg or foot becomes pale, cool, tingly, or numb. This information is not intended to replace advice given to you by your health care provider. Make sure you discuss any questions you have with your health care provider. Document Released: 03/17/2014 Document Revised: 12/20/2015 Document Reviewed: 01/31/2014 Elsevier Interactive Patient Education  [employer].

## 2018-04-16 NOTE — Progress Notes (Signed)
45f sheath removed from left femoral artery at 1020.  Manual pressure applied to site for 20 min.  Site is level 0 pre and post sheath removal.  Right DP pulse doppled pre and post hold.  bp 131/67, hr 90.  tegaderm dressing applied to site.  Post sheath removal instructions given, patient understands.  Bedrest starts at 1040.

## 2018-04-19 DIAGNOSIS — I2510 Atherosclerotic heart disease of native coronary artery without angina pectoris: Secondary | ICD-10-CM | POA: Diagnosis not present

## 2018-04-19 DIAGNOSIS — L97521 Non-pressure chronic ulcer of other part of left foot limited to breakdown of skin: Secondary | ICD-10-CM | POA: Diagnosis not present

## 2018-04-19 DIAGNOSIS — I110 Hypertensive heart disease with heart failure: Secondary | ICD-10-CM | POA: Diagnosis not present

## 2018-04-19 DIAGNOSIS — I5041 Acute combined systolic (congestive) and diastolic (congestive) heart failure: Secondary | ICD-10-CM | POA: Diagnosis not present

## 2018-04-19 DIAGNOSIS — S72001D Fracture of unspecified part of neck of right femur, subsequent encounter for closed fracture with routine healing: Secondary | ICD-10-CM | POA: Diagnosis not present

## 2018-04-19 DIAGNOSIS — E11621 Type 2 diabetes mellitus with foot ulcer: Secondary | ICD-10-CM | POA: Diagnosis not present

## 2018-04-20 DIAGNOSIS — E113493 Type 2 diabetes mellitus with severe nonproliferative diabetic retinopathy without macular edema, bilateral: Secondary | ICD-10-CM | POA: Diagnosis not present

## 2018-04-20 DIAGNOSIS — E113491 Type 2 diabetes mellitus with severe nonproliferative diabetic retinopathy without macular edema, right eye: Secondary | ICD-10-CM | POA: Diagnosis not present

## 2018-04-21 ENCOUNTER — Other Ambulatory Visit: Payer: Self-pay

## 2018-04-21 DIAGNOSIS — I110 Hypertensive heart disease with heart failure: Secondary | ICD-10-CM | POA: Diagnosis not present

## 2018-04-21 DIAGNOSIS — I739 Peripheral vascular disease, unspecified: Secondary | ICD-10-CM

## 2018-04-21 DIAGNOSIS — I5041 Acute combined systolic (congestive) and diastolic (congestive) heart failure: Secondary | ICD-10-CM | POA: Diagnosis not present

## 2018-04-21 DIAGNOSIS — S72001D Fracture of unspecified part of neck of right femur, subsequent encounter for closed fracture with routine healing: Secondary | ICD-10-CM | POA: Diagnosis not present

## 2018-04-21 DIAGNOSIS — E11621 Type 2 diabetes mellitus with foot ulcer: Secondary | ICD-10-CM | POA: Diagnosis not present

## 2018-04-21 DIAGNOSIS — L97521 Non-pressure chronic ulcer of other part of left foot limited to breakdown of skin: Secondary | ICD-10-CM | POA: Diagnosis not present

## 2018-04-21 DIAGNOSIS — I2510 Atherosclerotic heart disease of native coronary artery without angina pectoris: Secondary | ICD-10-CM | POA: Diagnosis not present

## 2018-04-21 DIAGNOSIS — I779 Disorder of arteries and arterioles, unspecified: Secondary | ICD-10-CM

## 2018-04-22 DIAGNOSIS — I2510 Atherosclerotic heart disease of native coronary artery without angina pectoris: Secondary | ICD-10-CM | POA: Diagnosis not present

## 2018-04-22 DIAGNOSIS — L97521 Non-pressure chronic ulcer of other part of left foot limited to breakdown of skin: Secondary | ICD-10-CM | POA: Diagnosis not present

## 2018-04-22 DIAGNOSIS — E11621 Type 2 diabetes mellitus with foot ulcer: Secondary | ICD-10-CM | POA: Diagnosis not present

## 2018-04-22 DIAGNOSIS — I5041 Acute combined systolic (congestive) and diastolic (congestive) heart failure: Secondary | ICD-10-CM | POA: Diagnosis not present

## 2018-04-22 DIAGNOSIS — I110 Hypertensive heart disease with heart failure: Secondary | ICD-10-CM | POA: Diagnosis not present

## 2018-04-22 DIAGNOSIS — S72001D Fracture of unspecified part of neck of right femur, subsequent encounter for closed fracture with routine healing: Secondary | ICD-10-CM | POA: Diagnosis not present

## 2018-04-23 ENCOUNTER — Ambulatory Visit (HOSPITAL_COMMUNITY)
Admission: RE | Admit: 2018-04-23 | Discharge: 2018-04-23 | Disposition: A | Discharge status: Home / Self Care | Payer: Medicare Other | Source: Ambulatory Visit | Attending: Surgery | Admitting: Surgery

## 2018-04-23 DIAGNOSIS — Z95828 Presence of other vascular implants and grafts: Secondary | ICD-10-CM | POA: Diagnosis not present

## 2018-04-23 DIAGNOSIS — I739 Peripheral vascular disease, unspecified: Secondary | ICD-10-CM | POA: Diagnosis not present

## 2018-04-23 DIAGNOSIS — I779 Disorder of arteries and arterioles, unspecified: Secondary | ICD-10-CM | POA: Diagnosis not present

## 2018-04-26 DIAGNOSIS — Z96641 Presence of right artificial hip joint: Secondary | ICD-10-CM | POA: Diagnosis not present

## 2018-04-26 DIAGNOSIS — I5040 Unspecified combined systolic (congestive) and diastolic (congestive) heart failure: Secondary | ICD-10-CM | POA: Diagnosis not present

## 2018-04-26 DIAGNOSIS — S72001D Fracture of unspecified part of neck of right femur, subsequent encounter for closed fracture with routine healing: Secondary | ICD-10-CM | POA: Diagnosis not present

## 2018-04-26 DIAGNOSIS — I2510 Atherosclerotic heart disease of native coronary artery without angina pectoris: Secondary | ICD-10-CM | POA: Diagnosis not present

## 2018-04-26 DIAGNOSIS — Z87891 Personal history of nicotine dependence: Secondary | ICD-10-CM | POA: Diagnosis not present

## 2018-04-26 DIAGNOSIS — L97521 Non-pressure chronic ulcer of other part of left foot limited to breakdown of skin: Secondary | ICD-10-CM | POA: Diagnosis not present

## 2018-04-26 DIAGNOSIS — E785 Hyperlipidemia, unspecified: Secondary | ICD-10-CM | POA: Diagnosis not present

## 2018-04-26 DIAGNOSIS — D649 Anemia, unspecified: Secondary | ICD-10-CM | POA: Diagnosis not present

## 2018-04-26 DIAGNOSIS — E1151 Type 2 diabetes mellitus with diabetic peripheral angiopathy without gangrene: Secondary | ICD-10-CM | POA: Diagnosis not present

## 2018-04-26 DIAGNOSIS — I110 Hypertensive heart disease with heart failure: Secondary | ICD-10-CM | POA: Diagnosis not present

## 2018-04-26 DIAGNOSIS — Z7984 Long term (current) use of oral hypoglycemic drugs: Secondary | ICD-10-CM | POA: Diagnosis not present

## 2018-04-26 DIAGNOSIS — E11621 Type 2 diabetes mellitus with foot ulcer: Secondary | ICD-10-CM | POA: Diagnosis not present

## 2018-04-28 ENCOUNTER — Inpatient Hospital Stay (HOSPITAL_COMMUNITY): Admission: RE | Admit: 2018-04-28 | Payer: Medicare Other | Source: Ambulatory Visit

## 2018-04-28 DIAGNOSIS — S72001D Fracture of unspecified part of neck of right femur, subsequent encounter for closed fracture with routine healing: Secondary | ICD-10-CM | POA: Diagnosis not present

## 2018-04-28 DIAGNOSIS — L97521 Non-pressure chronic ulcer of other part of left foot limited to breakdown of skin: Secondary | ICD-10-CM | POA: Diagnosis not present

## 2018-04-28 DIAGNOSIS — I5040 Unspecified combined systolic (congestive) and diastolic (congestive) heart failure: Secondary | ICD-10-CM | POA: Diagnosis not present

## 2018-04-28 DIAGNOSIS — I110 Hypertensive heart disease with heart failure: Secondary | ICD-10-CM | POA: Diagnosis not present

## 2018-04-28 DIAGNOSIS — I2510 Atherosclerotic heart disease of native coronary artery without angina pectoris: Secondary | ICD-10-CM | POA: Diagnosis not present

## 2018-04-28 DIAGNOSIS — E11621 Type 2 diabetes mellitus with foot ulcer: Secondary | ICD-10-CM | POA: Diagnosis not present

## 2018-04-29 ENCOUNTER — Telehealth: Payer: Self-pay | Admitting: *Deleted

## 2018-04-29 DIAGNOSIS — S72001D Fracture of unspecified part of neck of right femur, subsequent encounter for closed fracture with routine healing: Secondary | ICD-10-CM | POA: Diagnosis not present

## 2018-04-29 DIAGNOSIS — E11621 Type 2 diabetes mellitus with foot ulcer: Secondary | ICD-10-CM | POA: Diagnosis not present

## 2018-04-29 DIAGNOSIS — I110 Hypertensive heart disease with heart failure: Secondary | ICD-10-CM | POA: Diagnosis not present

## 2018-04-29 DIAGNOSIS — L97521 Non-pressure chronic ulcer of other part of left foot limited to breakdown of skin: Secondary | ICD-10-CM | POA: Diagnosis not present

## 2018-04-29 DIAGNOSIS — I2510 Atherosclerotic heart disease of native coronary artery without angina pectoris: Secondary | ICD-10-CM | POA: Diagnosis not present

## 2018-04-29 DIAGNOSIS — I5040 Unspecified combined systolic (congestive) and diastolic (congestive) heart failure: Secondary | ICD-10-CM | POA: Diagnosis not present

## 2018-04-29 NOTE — Telephone Encounter (Signed)
Spoke with patient's daughter Vivien Rota) and reminded to hold ASA and Plavix 5 days pre-op and expect call on 04/30/2018 from Bed control for admit to hospital.

## 2018-04-30 ENCOUNTER — Telehealth: Payer: Self-pay | Admitting: *Deleted

## 2018-04-30 ENCOUNTER — Encounter (HOSPITAL_COMMUNITY): Payer: Self-pay | Admitting: Internal Medicine

## 2018-04-30 ENCOUNTER — Inpatient Hospital Stay (HOSPITAL_COMMUNITY)
Admission: RE | Admit: 2018-04-30 | Discharge: 2018-05-05 | DRG: 271 | Disposition: A | Discharge status: Rehabilitation Facility | Payer: Medicare Other | Attending: Internal Medicine | Admitting: Internal Medicine

## 2018-04-30 ENCOUNTER — Other Ambulatory Visit: Payer: Self-pay

## 2018-04-30 DIAGNOSIS — I739 Peripheral vascular disease, unspecified: Secondary | ICD-10-CM | POA: Diagnosis not present

## 2018-04-30 DIAGNOSIS — D72829 Elevated white blood cell count, unspecified: Secondary | ICD-10-CM

## 2018-04-30 DIAGNOSIS — J449 Chronic obstructive pulmonary disease, unspecified: Secondary | ICD-10-CM | POA: Diagnosis present

## 2018-04-30 DIAGNOSIS — I110 Hypertensive heart disease with heart failure: Secondary | ICD-10-CM | POA: Diagnosis present

## 2018-04-30 DIAGNOSIS — I70245 Atherosclerosis of native arteries of left leg with ulceration of other part of foot: Secondary | ICD-10-CM | POA: Diagnosis not present

## 2018-04-30 DIAGNOSIS — N189 Chronic kidney disease, unspecified: Secondary | ICD-10-CM | POA: Diagnosis present

## 2018-04-30 DIAGNOSIS — D649 Anemia, unspecified: Secondary | ICD-10-CM | POA: Diagnosis present

## 2018-04-30 DIAGNOSIS — Z8249 Family history of ischemic heart disease and other diseases of the circulatory system: Secondary | ICD-10-CM

## 2018-04-30 DIAGNOSIS — N183 Chronic kidney disease, stage 3 unspecified: Secondary | ICD-10-CM

## 2018-04-30 DIAGNOSIS — E785 Hyperlipidemia, unspecified: Secondary | ICD-10-CM | POA: Diagnosis present

## 2018-04-30 DIAGNOSIS — E1151 Type 2 diabetes mellitus with diabetic peripheral angiopathy without gangrene: Principal | ICD-10-CM | POA: Diagnosis present

## 2018-04-30 DIAGNOSIS — I10 Essential (primary) hypertension: Secondary | ICD-10-CM | POA: Diagnosis present

## 2018-04-30 DIAGNOSIS — Z89411 Acquired absence of right great toe: Secondary | ICD-10-CM | POA: Diagnosis not present

## 2018-04-30 DIAGNOSIS — I361 Nonrheumatic tricuspid (valve) insufficiency: Secondary | ICD-10-CM | POA: Diagnosis not present

## 2018-04-30 DIAGNOSIS — E11621 Type 2 diabetes mellitus with foot ulcer: Secondary | ICD-10-CM | POA: Diagnosis not present

## 2018-04-30 DIAGNOSIS — L97529 Non-pressure chronic ulcer of other part of left foot with unspecified severity: Secondary | ICD-10-CM | POA: Diagnosis not present

## 2018-04-30 DIAGNOSIS — E119 Type 2 diabetes mellitus without complications: Secondary | ICD-10-CM | POA: Diagnosis not present

## 2018-04-30 DIAGNOSIS — Z7982 Long term (current) use of aspirin: Secondary | ICD-10-CM

## 2018-04-30 DIAGNOSIS — Z955 Presence of coronary angioplasty implant and graft: Secondary | ICD-10-CM | POA: Diagnosis not present

## 2018-04-30 DIAGNOSIS — Z79899 Other long term (current) drug therapy: Secondary | ICD-10-CM | POA: Diagnosis not present

## 2018-04-30 DIAGNOSIS — I5042 Chronic combined systolic (congestive) and diastolic (congestive) heart failure: Secondary | ICD-10-CM | POA: Diagnosis not present

## 2018-04-30 DIAGNOSIS — E1142 Type 2 diabetes mellitus with diabetic polyneuropathy: Secondary | ICD-10-CM | POA: Diagnosis present

## 2018-04-30 DIAGNOSIS — E871 Hypo-osmolality and hyponatremia: Secondary | ICD-10-CM | POA: Diagnosis present

## 2018-04-30 DIAGNOSIS — S91302A Unspecified open wound, left foot, initial encounter: Secondary | ICD-10-CM | POA: Diagnosis not present

## 2018-04-30 DIAGNOSIS — Z9889 Other specified postprocedural states: Secondary | ICD-10-CM | POA: Diagnosis not present

## 2018-04-30 DIAGNOSIS — R0602 Shortness of breath: Secondary | ICD-10-CM

## 2018-04-30 DIAGNOSIS — Z833 Family history of diabetes mellitus: Secondary | ICD-10-CM

## 2018-04-30 DIAGNOSIS — K219 Gastro-esophageal reflux disease without esophagitis: Secondary | ICD-10-CM | POA: Diagnosis not present

## 2018-04-30 DIAGNOSIS — Z89421 Acquired absence of other right toe(s): Secondary | ICD-10-CM

## 2018-04-30 DIAGNOSIS — Z87891 Personal history of nicotine dependence: Secondary | ICD-10-CM | POA: Diagnosis not present

## 2018-04-30 DIAGNOSIS — Z7984 Long term (current) use of oral hypoglycemic drugs: Secondary | ICD-10-CM | POA: Diagnosis not present

## 2018-04-30 DIAGNOSIS — Z7902 Long term (current) use of antithrombotics/antiplatelets: Secondary | ICD-10-CM | POA: Diagnosis not present

## 2018-04-30 DIAGNOSIS — I5032 Chronic diastolic (congestive) heart failure: Secondary | ICD-10-CM

## 2018-04-30 DIAGNOSIS — R609 Edema, unspecified: Secondary | ICD-10-CM | POA: Diagnosis not present

## 2018-04-30 DIAGNOSIS — D62 Acute posthemorrhagic anemia: Secondary | ICD-10-CM | POA: Diagnosis present

## 2018-04-30 DIAGNOSIS — Z23 Encounter for immunization: Secondary | ICD-10-CM | POA: Diagnosis not present

## 2018-04-30 DIAGNOSIS — D696 Thrombocytopenia, unspecified: Secondary | ICD-10-CM | POA: Diagnosis present

## 2018-04-30 DIAGNOSIS — J9811 Atelectasis: Secondary | ICD-10-CM | POA: Diagnosis not present

## 2018-04-30 DIAGNOSIS — I2510 Atherosclerotic heart disease of native coronary artery without angina pectoris: Secondary | ICD-10-CM | POA: Diagnosis present

## 2018-04-30 DIAGNOSIS — K5900 Constipation, unspecified: Secondary | ICD-10-CM | POA: Diagnosis not present

## 2018-04-30 DIAGNOSIS — E1122 Type 2 diabetes mellitus with diabetic chronic kidney disease: Secondary | ICD-10-CM | POA: Diagnosis not present

## 2018-04-30 DIAGNOSIS — R5381 Other malaise: Secondary | ICD-10-CM | POA: Diagnosis not present

## 2018-04-30 DIAGNOSIS — I130 Hypertensive heart and chronic kidney disease with heart failure and stage 1 through stage 4 chronic kidney disease, or unspecified chronic kidney disease: Secondary | ICD-10-CM | POA: Diagnosis not present

## 2018-04-30 LAB — COMPREHENSIVE METABOLIC PANEL
ALT: 8 U/L (ref 0–44)
AST: 14 U/L — ABNORMAL LOW (ref 15–41)
Albumin: 2.6 g/dL — ABNORMAL LOW (ref 3.5–5.0)
Alkaline Phosphatase: 99 U/L (ref 38–126)
Anion gap: 9 (ref 5–15)
BUN: 17 mg/dL (ref 8–23)
CO2: 28 mmol/L (ref 22–32)
Calcium: 8.3 mg/dL — ABNORMAL LOW (ref 8.9–10.3)
Chloride: 97 mmol/L — ABNORMAL LOW (ref 98–111)
Creatinine, Ser: 1.25 mg/dL — ABNORMAL HIGH (ref 0.44–1.00)
GFR calc Af Amer: 43 mL/min — ABNORMAL LOW (ref >60)
GFR calc non Af Amer: 37 mL/min — ABNORMAL LOW (ref >60)
Glucose, Bld: 203 mg/dL — ABNORMAL HIGH (ref 70–99)
Potassium: 3.5 mmol/L (ref 3.5–5.1)
Sodium: 134 mmol/L — ABNORMAL LOW (ref 135–145)
Total Bilirubin: 0.4 mg/dL (ref 0.3–1.2)
Total Protein: 5.6 g/dL — ABNORMAL LOW (ref 6.5–8.1)

## 2018-04-30 LAB — CBC WITH DIFFERENTIAL/PLATELET
Abs Immature Granulocytes: 0 10*3/uL (ref 0.0–0.1)
Basophils Absolute: 0.1 10*3/uL (ref 0.0–0.1)
Basophils Relative: 1 %
Eosinophils Absolute: 0.2 10*3/uL (ref 0.0–0.7)
Eosinophils Relative: 3 %
HCT: 29.3 % — ABNORMAL LOW (ref 36.0–46.0)
Hemoglobin: 9.2 g/dL — ABNORMAL LOW (ref 12.0–15.0)
Immature Granulocytes: 0 %
Lymphocytes Relative: 23 %
Lymphs Abs: 1.4 10*3/uL (ref 0.7–4.0)
MCH: 28.1 pg (ref 26.0–34.0)
MCHC: 31.4 g/dL (ref 30.0–36.0)
MCV: 89.6 fL (ref 78.0–100.0)
Monocytes Absolute: 0.6 10*3/uL (ref 0.1–1.0)
Monocytes Relative: 10 %
Neutro Abs: 3.9 10*3/uL (ref 1.7–7.7)
Neutrophils Relative %: 63 %
Platelets: 323 10*3/uL (ref 150–400)
RBC: 3.27 MIL/uL — ABNORMAL LOW (ref 3.87–5.11)
RDW: 14.7 % (ref 11.5–15.5)
WBC: 6.2 10*3/uL (ref 4.0–10.5)

## 2018-04-30 LAB — GLUCOSE, CAPILLARY: Glucose-Capillary: 202 mg/dL — ABNORMAL HIGH (ref 70–99)

## 2018-04-30 LAB — PHOSPHORUS: Phosphorus: 2.9 mg/dL (ref 2.5–4.6)

## 2018-04-30 LAB — MAGNESIUM: Magnesium: 1.6 mg/dL — ABNORMAL LOW (ref 1.7–2.4)

## 2018-04-30 LAB — BRAIN NATRIURETIC PEPTIDE: B Natriuretic Peptide: 122.4 pg/mL — ABNORMAL HIGH (ref 0.0–100.0)

## 2018-04-30 MED ORDER — CALCIUM CARBONATE ANTACID 500 MG PO CHEW
1.0000 | CHEWABLE_TABLET | Freq: Every day | ORAL | Status: DC
  Administered 2018-05-01 – 2018-05-05 (×5): 200 mg via ORAL
  Filled 2018-04-30 (×5): qty 1

## 2018-04-30 MED ORDER — AMLODIPINE BESYLATE 10 MG PO TABS
10.0000 mg | ORAL_TABLET | Freq: Every day | ORAL | Status: DC
  Administered 2018-05-01 – 2018-05-05 (×5): 10 mg via ORAL
  Filled 2018-04-30 (×5): qty 1

## 2018-04-30 MED ORDER — INFLUENZA VAC SPLIT HIGH-DOSE 0.5 ML IM SUSY
0.5000 mL | PREFILLED_SYRINGE | INTRAMUSCULAR | Status: AC
  Administered 2018-05-01: 0.5 mL via INTRAMUSCULAR
  Filled 2018-04-30: qty 0.5

## 2018-04-30 MED ORDER — ENOXAPARIN SODIUM 40 MG/0.4ML ~~LOC~~ SOLN: 40.0000 mg | SUBCUTANEOUS | Status: DC

## 2018-04-30 MED ORDER — GLIPIZIDE 5 MG PO TABS
5.0000 mg | ORAL_TABLET | Freq: Two times a day (BID) | ORAL | Status: DC
  Administered 2018-05-01 – 2018-05-05 (×9): 5 mg via ORAL
  Filled 2018-04-30 (×9): qty 1

## 2018-04-30 MED ORDER — SILVER SULFADIAZINE 1 % EX CREA
1.0000 "application " | TOPICAL_CREAM | Freq: Every day | CUTANEOUS | Status: DC
  Administered 2018-05-01 – 2018-05-05 (×5): 1 via TOPICAL
  Filled 2018-04-30: qty 85

## 2018-04-30 MED ORDER — ASPIRIN 81 MG PO CHEW
81.0000 mg | CHEWABLE_TABLET | Freq: Every day | ORAL | Status: DC
  Administered 2018-05-01 – 2018-05-05 (×5): 81 mg via ORAL
  Filled 2018-04-30 (×5): qty 1

## 2018-04-30 MED ORDER — VITAMIN D 1000 UNITS PO TABS
1000.0000 [IU] | ORAL_TABLET | Freq: Every day | ORAL | Status: DC
  Administered 2018-05-01 – 2018-05-05 (×5): 1000 [IU] via ORAL
  Filled 2018-04-30 (×5): qty 1

## 2018-04-30 MED ORDER — METOPROLOL TARTRATE 25 MG PO TABS
25.0000 mg | ORAL_TABLET | Freq: Two times a day (BID) | ORAL | Status: DC
  Administered 2018-04-30 – 2018-05-05 (×10): 25 mg via ORAL
  Filled 2018-04-30 (×10): qty 1

## 2018-04-30 MED ORDER — MAGNESIUM SULFATE 2 GM/50ML IV SOLN
2.0000 g | Freq: Once | INTRAVENOUS | Status: AC
  Administered 2018-04-30: 2 g via INTRAVENOUS
  Filled 2018-04-30: qty 50

## 2018-04-30 MED ORDER — POLYVINYL ALCOHOL 1.4 % OP SOLN: 1.0000 [drp] | Freq: Every day | OPHTHALMIC | Status: DC | PRN

## 2018-04-30 MED ORDER — ENOXAPARIN SODIUM 30 MG/0.3ML ~~LOC~~ SOLN
30.0000 mg | SUBCUTANEOUS | Status: DC
  Administered 2018-04-30 – 2018-05-02 (×3): 30 mg via SUBCUTANEOUS
  Filled 2018-04-30 (×3): qty 0.3

## 2018-04-30 MED ORDER — NITROGLYCERIN 0.4 MG SL SUBL: 0.4000 mg | SUBLINGUAL_TABLET | SUBLINGUAL | Status: DC | PRN

## 2018-04-30 MED ORDER — POTASSIUM CHLORIDE CRYS ER 20 MEQ PO TBCR
20.0000 meq | EXTENDED_RELEASE_TABLET | Freq: Once | ORAL | Status: AC
  Administered 2018-04-30: 20 meq via ORAL
  Filled 2018-04-30: qty 1

## 2018-04-30 MED ORDER — MAGNESIUM OXIDE 400 (241.3 MG) MG PO TABS
400.0000 mg | ORAL_TABLET | Freq: Every day | ORAL | Status: DC
  Administered 2018-05-01 – 2018-05-05 (×5): 400 mg via ORAL
  Filled 2018-04-30 (×5): qty 1

## 2018-04-30 MED ORDER — INSULIN ASPART 100 UNIT/ML ~~LOC~~ SOLN
0.0000 [IU] | Freq: Three times a day (TID) | SUBCUTANEOUS | Status: DC
  Administered 2018-05-01: 5 [IU] via SUBCUTANEOUS
  Administered 2018-05-01: 3 [IU] via SUBCUTANEOUS
  Administered 2018-05-01: 5 [IU] via SUBCUTANEOUS
  Administered 2018-05-02 (×2): 3 [IU] via SUBCUTANEOUS
  Administered 2018-05-02: 5 [IU] via SUBCUTANEOUS
  Administered 2018-05-03 – 2018-05-04 (×2): 3 [IU] via SUBCUTANEOUS
  Administered 2018-05-04 (×2): 5 [IU] via SUBCUTANEOUS
  Administered 2018-05-05 (×3): 3 [IU] via SUBCUTANEOUS

## 2018-04-30 MED ORDER — VITAMIN C 500 MG PO TABS
500.0000 mg | ORAL_TABLET | Freq: Every day | ORAL | Status: DC
  Administered 2018-05-01 – 2018-05-05 (×5): 500 mg via ORAL
  Filled 2018-04-30 (×5): qty 1

## 2018-04-30 MED ORDER — FUROSEMIDE 10 MG/ML IJ SOLN
20.0000 mg | Freq: Two times a day (BID) | INTRAMUSCULAR | Status: DC
  Administered 2018-04-30 – 2018-05-04 (×7): 20 mg via INTRAVENOUS
  Filled 2018-04-30 (×7): qty 2

## 2018-04-30 MED ORDER — POLYSACCHARIDE IRON COMPLEX 150 MG PO CAPS
150.0000 mg | ORAL_CAPSULE | Freq: Every day | ORAL | Status: DC
  Administered 2018-05-01 – 2018-05-05 (×5): 150 mg via ORAL
  Filled 2018-04-30 (×5): qty 1

## 2018-04-30 MED ORDER — TRAMADOL HCL 50 MG PO TABS
50.0000 mg | ORAL_TABLET | Freq: Two times a day (BID) | ORAL | Status: DC | PRN
  Administered 2018-05-05: 50 mg via ORAL
  Filled 2018-04-30: qty 1

## 2018-04-30 MED ORDER — ATORVASTATIN CALCIUM 80 MG PO TABS
80.0000 mg | ORAL_TABLET | Freq: Every day | ORAL | Status: DC
  Administered 2018-05-01 – 2018-05-05 (×5): 80 mg via ORAL
  Filled 2018-04-30 (×5): qty 1

## 2018-04-30 MED ORDER — LISINOPRIL 40 MG PO TABS
40.0000 mg | ORAL_TABLET | Freq: Every day | ORAL | Status: DC
  Administered 2018-05-01 – 2018-05-05 (×5): 40 mg via ORAL
  Filled 2018-04-30 (×5): qty 1

## 2018-04-30 NOTE — Progress Notes (Signed)
Pt arrived via wheelchair from home. Direct admit for PAD with LLE ulcer. CHG bath given. Telebox 02 applied, ccmd notified. Vitals stable. Jerald Kief, RN

## 2018-04-30 NOTE — H&P (Signed)
History and Physical    Cynthia Ray:[id number] DOB: 1926-04-11 DOA: 04/30/2018  PCP: Neale Burly, MD   Patient coming from: Home.  I have personally briefly reviewed patient's old medical records in [place]  Chief Complaint: Left foot ulcer  HPI: Cynthia Ray is a 84 y.o. female with medical history significant of anemia, osteoarthritis, carotid artery disease, COPD, GERD, hyperlipidemia, hypertension, type 2 diabetes, PVD with history of right femoropopliteal bypass who is coming referred by Dr. Oneida Alar for his left thumb BiPAP as a scheduled for Monday due to right foot nonhealing ulcers and demonstrated severe PAD on the lower extremities catheterization last month.  She denies fever, chills, fatigue or malaise.  She denies headache, but complains of mild rhinorrhea, mild sore throat and mildly productive cough for the past 2 days, no dyspnea, wheezing or hemoptysis.  She denies chest pain, palpitations, dizziness, diaphoresis, PND, orthopnea but states that she has chronic lower extremity edema.  She denies abdominal pain, nausea, emesis, diarrhea, constipation, melena or hematochezia.  She denies dysuria, frequency or hematuria.  No heat or cold intolerance.  No polyuria, polydipsia, polyphagia or blurred vision.   Review of Systems: As per HPI otherwise 10 point review of systems negative.    Past Medical History:  Diagnosis Date  . Anemia   . Arthritis    "dr said I have it in my back" Lower   . Carotid artery occlusion   . CHF (congestive heart failure) ([place]) 12/2017  . COPD (chronic obstructive pulmonary disease) ([place])   . Fractured hip ([place])    Fell and hurt R hip  . GERD (gastroesophageal reflux disease)   . History of blood transfusion 11/11/11  . Hyperlipidemia   . Hypertension   . PVD (peripheral vascular disease) ([place])    Stents left SFA 2004 Dr. Albertine Patricia  . Type II diabetes mellitus ([place])     Past Surgical History:  Procedure Laterality Date    . ABDOMINAL AORTAGRAM N/A 10/31/2011   Procedure: ABDOMINAL Maxcine Ham;  Surgeon: Elam Dutch, MD;  Location: [facility] CATH LAB;  Service: Cardiovascular;  Laterality: N/A;  . ABDOMINAL AORTOGRAM W/LOWER EXTREMITY N/A 04/16/2018   Procedure: ABDOMINAL AORTOGRAM W/LOWER EXTREMITY;  Surgeon: Elam Dutch, MD;  Location: [facility] CV LAB;  Service: Cardiovascular;  Laterality: N/A;  . AMPUTATION  03/23/2012   Procedure: AMPUTATION DIGIT;  Surgeon: Elam Dutch, MD;  Location: [facility] OR;  Service: Vascular;  Laterality: Right;  Right Femoral endarterectomy with profundaplasty; right femoral-popliteal bypass with nonreversed saphenous vein; and right first toe amputation  . CATARACT EXTRACTION W/ INTRAOCULAR LENS  IMPLANT, BILATERAL  2002  . COLONOSCOPY    . CORONARY ANGIOPLASTY WITH STENT PLACEMENT  01/19/12   "1; first one I've ever had"  . EYE SURGERY  2002   Cataract  . EYE SURGERY  2009   Laser bilateral   . FEMORAL ENDARTERECTOMY  03/23/2012   right  . FEMORAL-POPLITEAL BYPASS GRAFT  03/23/2012   Procedure: BYPASS GRAFT FEMORAL-POPLITEAL ARTERY;  Surgeon: Elam Dutch, MD;  Location: [facility] OR;  Service: Vascular;  Laterality: Right;  Right Femoral endarterectomy with profundaplasty; right femoral-popliteal bypass with nonreversed saphenous vein; and right first toe amputation  . PERCUTANEOUS CORONARY STENT INTERVENTION (PCI-S) N/A 01/19/2012   Procedure: PERCUTANEOUS CORONARY STENT INTERVENTION (PCI-S);  Surgeon: Peter M Martinique, MD;  Location: [facility] CATH LAB;  Service: Cardiovascular;  Laterality: N/A;  . PERIPHERAL ARTERIAL STENT GRAFT  2003   LLE  .  POSTERIOR LAMINECTOMY / DECOMPRESSION LUMBAR SPINE  1989  . REFRACTIVE SURGERY  2009   bilaterally  . TONSILLECTOMY AND ADENOIDECTOMY  1946     reports that she quit smoking about 30 years ago. Her smoking use included cigarettes. She started smoking about 54 years ago. She has a 24.00 pack-year smoking history. She has never used smokeless  tobacco. She reports that she does not drink alcohol or use drugs.  Allergies  Allergen Reactions  . Ibuprofen Other (See Comments)    Dr. Britta Mccreedy informed patient not to take this due to ulcer     Family History  Problem Relation Age of Onset  . Stroke Father 57  . Hypertension Father   . Stroke Mother 54  . Diabetes Mother   . Hypertension Mother   . Diabetes Sister   . Hyperlipidemia Sister   . Hypertension Sister   . Diabetes Brother   . Hypertension Brother     Prior to Admission medications   Medication Sig Start Date End Date Taking? Authorizing Provider  acetaminophen (TYLENOL) 650 MG CR tablet Take 1,300 mg by mouth every 8 (eight) hours as needed for pain.    [provider]  amLODipine (NORVASC) 10 MG tablet TAKE ONE TABLET BY MOUTH DAILY. Patient taking differently: Take 10 mg by mouth daily.  02/24/18   Arnoldo Lenis, MD  aspirin 81 MG chewable tablet Chew 81 mg by mouth daily.     [provider]  atorvastatin (LIPITOR) 80 MG tablet Take 80 mg by mouth daily.    [provider]  calcium carbonate (TUMS - DOSED IN MG ELEMENTAL CALCIUM) 500 MG chewable tablet Chew 1 tablet by mouth daily.    [provider]  cholecalciferol (VITAMIN D) 1000 UNITS tablet Take 1,000 Units by mouth daily.    [provider]  clopidogrel (PLAVIX) 75 MG tablet TAKE ONE TABLET BY MOUTH DAILY. Patient taking differently: Take 75 mg by mouth daily.  02/24/18   Arnoldo Lenis, MD  furosemide (LASIX) 20 MG tablet Take 20 mg by mouth daily. 03/24/18   [provider]  glipiZIDE (GLUCOTROL) 5 MG tablet Take 5 mg by mouth 2 (two) times daily before a meal.    [provider]  iron polysaccharides (NIFEREX) 150 MG capsule Take 150 mg by mouth daily.  12/04/17   [provider]  lisinopril (PRINIVIL,ZESTRIL) 40 MG tablet Take 1 tablet (40 mg total) by mouth daily. 03/07/15   Arnoldo Lenis, MD  magnesium oxide (MAG-OX)  400 MG tablet Take 400 mg by mouth daily.    [provider]  metFORMIN (GLUCOPHAGE) 1000 MG tablet Take 1,000 mg by mouth 2 (two) times daily with a meal.     [provider]  metoprolol tartrate (LOPRESSOR) 25 MG tablet Take 1 tablet (25 mg total) by mouth 2 (two) times daily. 03/07/15   Arnoldo Lenis, MD  nitroGLYCERIN (NITROSTAT) 0.4 MG SL tablet Place 0.4 mg under the tongue every 5 (five) minutes as needed for chest pain. 01/20/12   Arguello, Roger A, PA-C  Polyethyl Glycol-Propyl Glycol (SYSTANE) 0.4-0.3 % SOLN Place 1 drop into both eyes daily as needed (for dry eyes).    [provider]  silver sulfADIAZINE (SILVADENE) 1 % cream Apply 1 application topically daily.    [provider]  traMADol (ULTRAM) 50 MG tablet Take 50 mg by mouth 2 (two) times daily as needed for moderate pain.     [provider]  vitamin C (ASCORBIC ACID) 500 MG tablet Take 500 mg by mouth daily.  12/03/17   [provider]    Physical Exam: Vitals:   04/30/18 1553  BP: (!) 137/53  Pulse: 86  Resp: 18  Temp: 98.2 F (36.8 C)  TempSrc: Oral  SpO2: 99%  Weight: 63.3 kg  Height: 5' (1.524 m)    Constitutional: NAD, calm, comfortable Eyes: PERRL, lids and conjunctivae normal ENMT: Mucous membranes are moist. Posterior pharynx clear of any exudate or lesions. Neck: normal, supple, no masses, no thyromegaly Respiratory: clear to auscultation bilaterally, no wheezing, no crackles. Normal respiratory effort. No accessory muscle use.  Cardiovascular: Regular rate and rhythm, no murmurs / rubs / gallops.  3+ lower extremities pitting edema.  No palpable left pedal pulse. No carotid bruits.  Abdomen: Soft, no tenderness, no masses palpated. No hepatosplenomegaly. Bowel sounds positive.  Musculoskeletal: no clubbing / cyanosis. Good ROM, no contractures. Normal muscle tone.  Skin: Multiple wounds on left toes and foot.  Please see pictures below. Neurologic:  CN 2-12 grossly intact. Sensation intact, DTR normal. Strength 5/5 in all 4.  Psychiatric: Normal judgment and insight. Alert and oriented x 4. Normal mood.             Labs on Admission: I have personally reviewed following labs and imaging studies  CBC: Recent Labs  Lab 04/30/18 1646  WBC 6.2  NEUTROABS 3.9  HGB 9.2*  HCT 29.3*  MCV 89.6  PLT 440   Basic Metabolic Panel: Recent Labs  Lab 04/30/18 1646  NA 134*  K 3.5  CL 97*  CO2 28  GLUCOSE 203*  BUN 17  CREATININE 1.25*  CALCIUM 8.3*  MG 1.6*  PHOS 2.9   GFR: Estimated Creatinine Clearance: 25.3 mL/min (A) (by C-G formula based on SCr of 1.25 mg/dL (H)). Liver Function Tests: Recent Labs  Lab 04/30/18 1646  AST 14*  ALT 8  ALKPHOS 99  BILITOT 0.4  PROT 5.6*  ALBUMIN 2.6*   No results for input(s): LIPASE, AMYLASE in the last 168 hours. No results for input(s): AMMONIA in the last 168 hours. Coagulation Profile: No results for input(s): INR, PROTIME in the last 168 hours. Cardiac Enzymes: No results for input(s): CKTOTAL, CKMB, CKMBINDEX, TROPONINI in the last 168 hours. BNP (last 3 results) No results for input(s): PROBNP in the last 8760 hours. HbA1C: No results for input(s): HGBA1C in the last 72 hours. CBG: No results for input(s): GLUCAP in the last 168 hours. Lipid Profile: No results for input(s): CHOL, HDL, LDLCALC, TRIG, CHOLHDL, LDLDIRECT in the last 72 hours. Thyroid Function Tests: No results for input(s): TSH, T4TOTAL, FREET4, T3FREE, THYROIDAB in the last 72 hours. Anemia Panel: No results for input(s): VITAMINB12, FOLATE, FERRITIN, TIBC, IRON, RETICCTPCT in the last 72 hours. Urine analysis:    Component Value Date/Time   COLORURINE YELLOW 03/25/2012 0929   APPEARANCEUR CLEAR 03/25/2012 0929   LABSPEC 1.008 03/25/2012 0929   PHURINE 6.0 03/25/2012 0929   GLUCOSEU NEGATIVE 03/25/2012 0929   HGBUR NEGATIVE 03/25/2012 0929   BILIRUBINUR NEGATIVE 03/25/2012 0929    KETONESUR NEGATIVE 03/25/2012 0929   PROTEINUR NEGATIVE 03/25/2012 0929   UROBILINOGEN 0.2 03/25/2012 0929   NITRITE NEGATIVE 03/25/2012 0929   LEUKOCYTESUR NEGATIVE 03/25/2012 0929    Radiological Exams on Admission: No results found.  EKG: Independently reviewed.  Vent. rate 88 BPM PR interval 164 ms QRS duration 68 ms QT/QTc 376/454 ms P-R-T axes * 53 146 Normal sinus rhythm Indeterminate  axis Low voltage QRS Septal infarct , age undetermined Lateral infarct , age undetermined Abnormal ECG  Assessment/Plan Principal Problem:   PAD (peripheral artery disease) ([place]) Admit to stepdown/inpatient. Hold clopidogrel and metformin. Continue other medications. Continue wound care. Vascular surgery to consult.    Edema Switch furosemide to IV 20 mg twice a day. Replace potassium and magnesium. Monitor intake and output. Daily weights. Check echocardiogram.  Active Problems:   HTN (hypertension) Continue amlodipine 10 mg p.o. daily. Continue lisinopril 40 mg p.o. daily. Continue metoprolol 25 mg p.o. twice daily. On furosemide 20 mg IVP twice daily now. Monitor blood pressure, heart rate, renal function electrolytes.    Dyslipidemia Continue atorvastatin 80 mg p.o. daily. Monitor LFTs.    CAD (coronary artery disease) Continue metoprolol, aspirin and atorvastatin.. Hold clopidogrel in anticipation of surgery. Check echocardiogram.    Anemia Monitor hematocrit and hemoglobin.    Hyponatremia Secondary to volume overload and furosemide use.    Hypomagnesemia Replacing. Follow-up magnesium level preop.    GERD (gastroesophageal reflux disease) Protonix 40 mg p.o. daily.    Type II diabetes mellitus (HCC) Last hemoglobin A1c on record was 6.4% in 2013. Carbohydrate modified diet. Continue glipizide 5 mg p.o. twice daily. Hold metformin. CBG monitoring regular insulin sliding scale while in the hospital.    COPD (chronic obstructive pulmonary disease)  ([place]) Asymptomatic at this time. Supplemental oxygen and bronchodilators as needed.    DVT prophylaxis: Lovenox SQ. Code Status: Full code. Family Communication:  Disposition Plan: Admit for preop evaluation and medical management optimization. Consults called: Vascular surgery (Dr. Oneida Alar) Admission status: Inpatient/stepdown.   Reubin Milan MD [facility] Pager [phone].  If 7PM-7AM, please contact night-coverage [email or web address] Password [email or web address]  04/30/2018, 6:30 PM

## 2018-04-30 NOTE — Telephone Encounter (Signed)
Spoke to patient's daughter today. Expect phone call from bed placement for admission to [facility] hospital today for admission to monitored bed with hospitalist service. (spoke to Dr. Waldron Labs) Clarified to  Continue ASA and HOLD Plavix for surgery. To call back if any questions.

## 2018-05-01 ENCOUNTER — Inpatient Hospital Stay (HOSPITAL_COMMUNITY): Payer: Medicare Other

## 2018-05-01 DIAGNOSIS — K219 Gastro-esophageal reflux disease without esophagitis: Secondary | ICD-10-CM

## 2018-05-01 DIAGNOSIS — R609 Edema, unspecified: Secondary | ICD-10-CM

## 2018-05-01 LAB — CBC WITH DIFFERENTIAL/PLATELET
Abs Immature Granulocytes: 0 10*3/uL (ref 0.0–0.1)
Basophils Absolute: 0.1 10*3/uL (ref 0.0–0.1)
Basophils Relative: 2 %
Eosinophils Absolute: 0.2 10*3/uL (ref 0.0–0.7)
Eosinophils Relative: 4 %
HCT: 29.2 % — ABNORMAL LOW (ref 36.0–46.0)
Hemoglobin: 9.2 g/dL — ABNORMAL LOW (ref 12.0–15.0)
Immature Granulocytes: 0 %
Lymphocytes Relative: 31 %
Lymphs Abs: 1.6 10*3/uL (ref 0.7–4.0)
MCH: 27.8 pg (ref 26.0–34.0)
MCHC: 31.5 g/dL (ref 30.0–36.0)
MCV: 88.2 fL (ref 78.0–100.0)
Monocytes Absolute: 0.7 10*3/uL (ref 0.1–1.0)
Monocytes Relative: 12 %
Neutro Abs: 2.7 10*3/uL (ref 1.7–7.7)
Neutrophils Relative %: 51 %
Platelets: 338 10*3/uL (ref 150–400)
RBC: 3.31 MIL/uL — ABNORMAL LOW (ref 3.87–5.11)
RDW: 14.6 % (ref 11.5–15.5)
WBC: 5.3 10*3/uL (ref 4.0–10.5)

## 2018-05-01 LAB — BASIC METABOLIC PANEL
Anion gap: 12 (ref 5–15)
BUN: 13 mg/dL (ref 8–23)
CO2: 28 mmol/L (ref 22–32)
Calcium: 8.7 mg/dL — ABNORMAL LOW (ref 8.9–10.3)
Chloride: 99 mmol/L (ref 98–111)
Creatinine, Ser: 1.18 mg/dL — ABNORMAL HIGH (ref 0.44–1.00)
GFR calc Af Amer: 46 mL/min — ABNORMAL LOW (ref >60)
GFR calc non Af Amer: 40 mL/min — ABNORMAL LOW (ref >60)
Glucose, Bld: 103 mg/dL — ABNORMAL HIGH (ref 70–99)
Potassium: 3.5 mmol/L (ref 3.5–5.1)
Sodium: 139 mmol/L (ref 135–145)

## 2018-05-01 LAB — GLUCOSE, CAPILLARY
Glucose-Capillary: 162 mg/dL — ABNORMAL HIGH (ref 70–99)
Glucose-Capillary: 210 mg/dL — ABNORMAL HIGH (ref 70–99)
Glucose-Capillary: 210 mg/dL — ABNORMAL HIGH (ref 70–99)
Glucose-Capillary: 116 mg/dL — ABNORMAL HIGH (ref 70–99)

## 2018-05-01 LAB — MRSA PCR SCREENING: MRSA by PCR: NEGATIVE

## 2018-05-01 LAB — MAGNESIUM: Magnesium: 2 mg/dL (ref 1.7–2.4)

## 2018-05-01 NOTE — Progress Notes (Signed)
PROGRESS NOTE  Cynthia Ray SNK:[id number] DOB: 06-11-1926 DOA: 04/30/2018 PCP: Neale Burly, MD  HPI/Recap of past 81 hours: 84 year old female with medical history significant for CAD, COPD, GERD, hypertension, type II DM, PVD with history of right femoral-popliteal bypass who was referred by Dr. Oneida Alar for a left femoral bypass on 05/03/2018 for nonhealing ulcer of left foot secondary to PVD.  Of note, on 04/16/2018 patient had abdominal aortogram with bilateral lower extremity runoff which showed subtotal occlusion left distal common femoral artery, occlusion left superficial femoral and popliteal artery with patent previous bypass.  Patient admitted for possible diuresis prior to bypass as patient has significant bilateral lower extremity edema.  Patient admitted for further management.   Today, patient denies any new complaints, eyes any chest pain, shortness of breath, abdominal pain, nausea/vomiting, fever/chills.  Assessment/Plan: Principal Problem:   PAD (peripheral artery disease) (HCC) Active Problems:   HTN (hypertension)   Dyslipidemia   CAD (coronary artery disease)   Anemia   Hyponatremia   GERD (gastroesophageal reflux disease)   Type II diabetes mellitus (HCC)   COPD (chronic obstructive pulmonary disease) (HCC)   Hypomagnesemia   Edema  Peripheral vascular disease with non-healing ulcer of the left foot/BLE edema History as above Vascular surgery Dr. Oneida Alar on board, plan for bypass on 05/03/2018 CXR and ECHO pending Continue IV Lasix due to edema Hold clopidogrel pending surgery Strict I's and O's, daily weights Monitor on telemetry  Diabetes mellitus type 2 Last A1c on record was 6.4% in 2013 SSI, glipizide, Accu-Cheks Hold metformin  Hypertension Continue amlodipine, lisinopril, metoprolol  Hyperlipidemia Continue statins  CAD Continue metoprolol, aspirin, statins Hold clopidogrel  Normocytic anemia Hemoglobin baseline around 10 Continue  iron supplementation Daily CBC  GERD Continue PPI     Code Status: Full  Family Communication: None at bedside  Disposition Plan: Once work-up complete   Consultants:  Vascular surgery  Procedures:  None  Antimicrobials:  None  DVT prophylaxis: Enoxaparin   Objective: Vitals:   05/01/18 0600 05/01/18 0943 05/01/18 0945 05/01/18 1109  BP: (!) 152/69 (!) 149/55  (!) 148/53  Pulse: 87 96 94 91  Resp: 16   19  Temp: 97.9 F (36.6 C)   98.2 F (36.8 C)  TempSrc: Oral   Oral  SpO2: 98% 96%  97%  Weight:      Height:        Intake/Output Summary (Last 24 hours) at 05/01/2018 1341 Last data filed at 05/01/2018 1200 Gross per 24 hour  Intake 240 ml  Output 2600 ml  Net -2360 ml   Filed Weights   04/30/18 1553  Weight: 63.3 kg    Exam:   General: NAD  Cardiovascular: S1, S2 present  Respiratory: CTA B  Abdomen: Soft, nontender, nondistended, bowel sounds present  Musculoskeletal: 3+ bilateral pitting edema, no palpable left pedal pulse  Skin: Multiple ulcers on left toes and foot  Psychiatry: Normal mood   Data Reviewed: CBC: Recent Labs  Lab 04/30/18 1646 05/01/18 0752  WBC 6.2 5.3  NEUTROABS 3.9 2.7  HGB 9.2* 9.2*  HCT 29.3* 29.2*  MCV 89.6 88.2  PLT 323 937   Basic Metabolic Panel: Recent Labs  Lab 04/30/18 1646 05/01/18 0752  NA 134* 139  K 3.5 3.5  CL 97* 99  CO2 28 28  GLUCOSE 203* 103*  BUN 17 13  CREATININE 1.25* 1.18*  CALCIUM 8.3* 8.7*  MG 1.6* 2.0  PHOS 2.9  --    GFR:  Estimated Creatinine Clearance: 26.8 mL/min (A) (by C-G formula based on SCr of 1.18 mg/dL (H)). Liver Function Tests: Recent Labs  Lab 04/30/18 1646  AST 14*  ALT 8  ALKPHOS 99  BILITOT 0.4  PROT 5.6*  ALBUMIN 2.6*   No results for input(s): LIPASE, AMYLASE in the last 168 hours. No results for input(s): AMMONIA in the last 168 hours. Coagulation Profile: No results for input(s): INR, PROTIME in the last 168 hours. Cardiac  Enzymes: No results for input(s): CKTOTAL, CKMB, CKMBINDEX, TROPONINI in the last 168 hours. BNP (last 3 results) No results for input(s): PROBNP in the last 8760 hours. HbA1C: No results for input(s): HGBA1C in the last 72 hours. CBG: Recent Labs  Lab 04/30/18 2228 05/01/18 0618 05/01/18 1111  GLUCAP 202* 162* 210*   Lipid Profile: No results for input(s): CHOL, HDL, LDLCALC, TRIG, CHOLHDL, LDLDIRECT in the last 72 hours. Thyroid Function Tests: No results for input(s): TSH, T4TOTAL, FREET4, T3FREE, THYROIDAB in the last 72 hours. Anemia Panel: No results for input(s): VITAMINB12, FOLATE, FERRITIN, TIBC, IRON, RETICCTPCT in the last 72 hours. Urine analysis:    Component Value Date/Time   COLORURINE YELLOW 03/25/2012 0929   APPEARANCEUR CLEAR 03/25/2012 0929   LABSPEC 1.008 03/25/2012 0929   PHURINE 6.0 03/25/2012 0929   GLUCOSEU NEGATIVE 03/25/2012 0929   HGBUR NEGATIVE 03/25/2012 0929   BILIRUBINUR NEGATIVE 03/25/2012 0929   KETONESUR NEGATIVE 03/25/2012 0929   PROTEINUR NEGATIVE 03/25/2012 0929   UROBILINOGEN 0.2 03/25/2012 0929   NITRITE NEGATIVE 03/25/2012 0929   LEUKOCYTESUR NEGATIVE 03/25/2012 0929   Sepsis Labs: @LABRCNTIP (procalcitonin:4,lacticidven:4)  ) Recent Results (from the past 240 hour(s))  MRSA PCR Screening     Status: None   Collection Time: 04/30/18  8:33 PM  Result Value Ref Range Status   MRSA by PCR NEGATIVE NEGATIVE Final    Comment:        The GeneXpert MRSA Assay (FDA approved for NASAL specimens only), is one component of a comprehensive MRSA colonization surveillance program. It is not intended to diagnose MRSA infection nor to guide or monitor treatment for MRSA infections. Performed at [facility], [place] [address], [place], [place] [address]       Studies: No results found.  Scheduled Meds: . amLODipine  10 mg Oral Daily  . aspirin  81 mg Oral Daily  . atorvastatin  80 mg Oral Daily  . calcium carbonate  1 tablet  Oral Q breakfast  . cholecalciferol  1,000 Units Oral Daily  . enoxaparin (LOVENOX) injection  30 mg Subcutaneous Q24H  . furosemide  20 mg Intravenous BID  . glipiZIDE  5 mg Oral BID AC  . insulin aspart  0-15 Units Subcutaneous TID WC  . iron polysaccharides  150 mg Oral Daily  . lisinopril  40 mg Oral Daily  . magnesium oxide  400 mg Oral Daily  . metoprolol tartrate  25 mg Oral BID  . silver sulfADIAZINE  1 application Topical Daily  . vitamin C  500 mg Oral Daily    Continuous Infusions:   LOS: 1 day     Alma Friendly, MD [facility]   If 7PM-7AM, please contact night-coverage [email or web address] 05/01/2018, 1:41 PM

## 2018-05-02 ENCOUNTER — Inpatient Hospital Stay (HOSPITAL_COMMUNITY): Payer: Medicare Other

## 2018-05-02 DIAGNOSIS — I361 Nonrheumatic tricuspid (valve) insufficiency: Secondary | ICD-10-CM

## 2018-05-02 LAB — COMPREHENSIVE METABOLIC PANEL
ALT: 10 U/L (ref 0–44)
AST: 15 U/L (ref 15–41)
Albumin: 2.5 g/dL — ABNORMAL LOW (ref 3.5–5.0)
Alkaline Phosphatase: 87 U/L (ref 38–126)
Anion gap: 7 (ref 5–15)
BUN: 18 mg/dL (ref 8–23)
CO2: 31 mmol/L (ref 22–32)
Calcium: 8.4 mg/dL — ABNORMAL LOW (ref 8.9–10.3)
Chloride: 98 mmol/L (ref 98–111)
Creatinine, Ser: 1.29 mg/dL — ABNORMAL HIGH (ref 0.44–1.00)
GFR calc Af Amer: 41 mL/min — ABNORMAL LOW (ref >60)
GFR calc non Af Amer: 36 mL/min — ABNORMAL LOW (ref >60)
Glucose, Bld: 181 mg/dL — ABNORMAL HIGH (ref 70–99)
Potassium: 3.3 mmol/L — ABNORMAL LOW (ref 3.5–5.1)
Sodium: 136 mmol/L (ref 135–145)
Total Bilirubin: 0.5 mg/dL (ref 0.3–1.2)
Total Protein: 5.4 g/dL — ABNORMAL LOW (ref 6.5–8.1)

## 2018-05-02 LAB — BASIC METABOLIC PANEL
Anion gap: 11 (ref 5–15)
BUN: 15 mg/dL (ref 8–23)
CO2: 28 mmol/L (ref 22–32)
Calcium: 8.5 mg/dL — ABNORMAL LOW (ref 8.9–10.3)
Chloride: 99 mmol/L (ref 98–111)
Creatinine, Ser: 1.18 mg/dL — ABNORMAL HIGH (ref 0.44–1.00)
GFR calc Af Amer: 46 mL/min — ABNORMAL LOW (ref >60)
GFR calc non Af Amer: 40 mL/min — ABNORMAL LOW (ref >60)
Glucose, Bld: 170 mg/dL — ABNORMAL HIGH (ref 70–99)
Potassium: 3.6 mmol/L (ref 3.5–5.1)
Sodium: 138 mmol/L (ref 135–145)

## 2018-05-02 LAB — CBC WITH DIFFERENTIAL/PLATELET
Abs Immature Granulocytes: 0 10*3/uL (ref 0.0–0.1)
Basophils Absolute: 0.1 10*3/uL (ref 0.0–0.1)
Basophils Relative: 1 %
Eosinophils Absolute: 0.2 10*3/uL (ref 0.0–0.7)
Eosinophils Relative: 3 %
HCT: 28.3 % — ABNORMAL LOW (ref 36.0–46.0)
Hemoglobin: 9 g/dL — ABNORMAL LOW (ref 12.0–15.0)
Immature Granulocytes: 0 %
Lymphocytes Relative: 30 %
Lymphs Abs: 1.6 10*3/uL (ref 0.7–4.0)
MCH: 27.9 pg (ref 26.0–34.0)
MCHC: 31.8 g/dL (ref 30.0–36.0)
MCV: 87.6 fL (ref 78.0–100.0)
Monocytes Absolute: 0.6 10*3/uL (ref 0.1–1.0)
Monocytes Relative: 11 %
Neutro Abs: 2.9 10*3/uL (ref 1.7–7.7)
Neutrophils Relative %: 55 %
Platelets: 313 10*3/uL (ref 150–400)
RBC: 3.23 MIL/uL — ABNORMAL LOW (ref 3.87–5.11)
RDW: 14.9 % (ref 11.5–15.5)
WBC: 5.4 10*3/uL (ref 4.0–10.5)

## 2018-05-02 LAB — APTT: aPTT: 33 seconds (ref 24–36)

## 2018-05-02 LAB — GLUCOSE, CAPILLARY
Glucose-Capillary: 165 mg/dL — ABNORMAL HIGH (ref 70–99)
Glucose-Capillary: 213 mg/dL — ABNORMAL HIGH (ref 70–99)
Glucose-Capillary: 168 mg/dL — ABNORMAL HIGH (ref 70–99)
Glucose-Capillary: 182 mg/dL — ABNORMAL HIGH (ref 70–99)

## 2018-05-02 LAB — ECHOCARDIOGRAM COMPLETE
Height: 60 in
Weight: 2112.89 oz

## 2018-05-02 LAB — PROTIME-INR
INR: 1.05
Prothrombin Time: 13.6 seconds (ref 11.4–15.2)

## 2018-05-02 LAB — CBC
HCT: 28.3 % — ABNORMAL LOW (ref 36.0–46.0)
Hemoglobin: 9.1 g/dL — ABNORMAL LOW (ref 12.0–15.0)
MCH: 28.4 pg (ref 26.0–34.0)
MCHC: 32.2 g/dL (ref 30.0–36.0)
MCV: 88.4 fL (ref 78.0–100.0)
Platelets: 323 10*3/uL (ref 150–400)
RBC: 3.2 MIL/uL — ABNORMAL LOW (ref 3.87–5.11)
RDW: 14.9 % (ref 11.5–15.5)
WBC: 6.2 10*3/uL (ref 4.0–10.5)

## 2018-05-02 LAB — HEMOGLOBIN A1C
Hgb A1c MFr Bld: 7.8 % — ABNORMAL HIGH (ref 4.8–5.6)
Mean Plasma Glucose: 177 mg/dL

## 2018-05-02 LAB — MAGNESIUM: Magnesium: 1.7 mg/dL (ref 1.7–2.4)

## 2018-05-02 MED ORDER — SODIUM CHLORIDE 0.9 % IV SOLN
INTRAVENOUS | Status: DC
  Administered 2018-05-02: 10 mL/h via INTRAVENOUS

## 2018-05-02 MED ORDER — CHLORHEXIDINE GLUCONATE 4 % EX LIQD
60.0000 mL | Freq: Once | CUTANEOUS | Status: AC
  Administered 2018-05-03: 4 via TOPICAL
  Filled 2018-05-02: qty 15

## 2018-05-02 MED ORDER — CEFAZOLIN SODIUM-DEXTROSE 2-4 GM/100ML-% IV SOLN
2.0000 g | INTRAVENOUS | Status: AC
  Administered 2018-05-03: 2 g via INTRAVENOUS
  Filled 2018-05-02: qty 100

## 2018-05-02 MED ORDER — CEFAZOLIN SODIUM-DEXTROSE 2-4 GM/100ML-% IV SOLN
2.0000 g | INTRAVENOUS | Status: DC
  Filled 2018-05-02: qty 100

## 2018-05-02 MED ORDER — CHLORHEXIDINE GLUCONATE 4 % EX LIQD
60.0000 mL | Freq: Once | CUTANEOUS | Status: AC
  Administered 2018-05-02: 4 via TOPICAL
  Filled 2018-05-02: qty 15

## 2018-05-02 NOTE — Progress Notes (Signed)
   VASCULAR SURGERY ASSESSMENT & PLAN:   PERIPHERAL VASCULAR DISEASE: Patient is scheduled for a left femoral endarterectomy and left femoral to tibial artery bypass tomorrow by Dr. Ruta Hinds.  The patient was admitted for diuresis.  I have discussed the procedure with her and answered all of her questions.  She is agreeable to proceed.   SUBJECTIVE:   No specific complaints this morning.  PHYSICAL EXAM:   Vitals:   05/02/18 0000 05/02/18 0500 05/02/18 0751 05/02/18 0800  BP: (!) 140/52  (!) 146/56 (!) 132/58  Pulse: 85 81 86 85  Resp: 19 18  19   Temp: 98.2 F (36.8 C)   98.1 F (36.7 C)  TempSrc: Oral   Oral  SpO2: 92% 92%  94%  Weight:  59.9 kg    Height:       Lungs are clear.  LABS:   Lab Results  Component Value Date   WBC 5.4 05/02/2018   HGB 9.0 (L) 05/02/2018   HCT 28.3 (L) 05/02/2018   MCV 87.6 05/02/2018   PLT 313 05/02/2018   Lab Results  Component Value Date   CREATININE 1.18 (H) 05/02/2018   Lab Results  Component Value Date   INR 1.08 03/17/2012   CBG (last 3)  Recent Labs    05/01/18 1603 05/01/18 2047 05/02/18 0627  GLUCAP 210* 116* 165*    PROBLEM LIST:    Principal Problem:   PAD (peripheral artery disease) (HCC) Active Problems:   HTN (hypertension)   Dyslipidemia   CAD (coronary artery disease)   Anemia   Hyponatremia   GERD (gastroesophageal reflux disease)   Type II diabetes mellitus (HCC)   COPD (chronic obstructive pulmonary disease) (HCC)   Hypomagnesemia   Edema   CURRENT MEDS:   . amLODipine  10 mg Oral Daily  . aspirin  81 mg Oral Daily  . atorvastatin  80 mg Oral Daily  . calcium carbonate  1 tablet Oral Q breakfast  . cholecalciferol  1,000 Units Oral Daily  . enoxaparin (LOVENOX) injection  30 mg Subcutaneous Q24H  . furosemide  20 mg Intravenous BID  . glipiZIDE  5 mg Oral BID AC  . insulin aspart  0-15 Units Subcutaneous TID WC  . iron polysaccharides  150 mg Oral Daily  . lisinopril  40 mg Oral  Daily  . magnesium oxide  400 mg Oral Daily  . metoprolol tartrate  25 mg Oral BID  . silver sulfADIAZINE  1 application Topical Daily  . vitamin C  500 mg Oral Daily    Deitra Mayo Beeper: [phone] Office: [phone] 05/02/2018

## 2018-05-02 NOTE — Progress Notes (Signed)
Cynthia NOTE  Cynthia Ray FYB:[id number] DOB: 06-13-1926 DOA: 04/30/2018 PCP: Cynthia Ray, Cynthia  HPI/Recap of past 52 hours: 84 year old female with medical history significant for Ray, Cynthia Ray, Cynthia Ray, Cynthia Ray, Cynthia Ray, Cynthia with history of right femoral-popliteal bypass who was referred by Dr. Oneida Ray for a left femoral bypass on 05/03/2018 for nonhealing ulcer of left foot secondary to Cynthia.  Of note, on 04/16/2018 patient had abdominal aortogram with bilateral lower extremity runoff which showed subtotal occlusion left distal common femoral artery, occlusion left superficial femoral and popliteal artery with patent previous bypass.  Patient admitted for possible diuresis prior to bypass as patient has significant bilateral lower extremity edema.  Patient admitted for further management.   Today, patient denies any new complaints. BLE has significantly improved  Assessment/Plan: Principal Problem:   PAD (peripheral artery disease) (HCC) Active Problems:   HTN (Cynthia Ray)   Dyslipidemia   Ray (coronary artery disease)   Anemia   Hyponatremia   Cynthia Ray (gastroesophageal reflux disease)   Cynthia II diabetes mellitus (HCC)   Cynthia Ray (chronic obstructive pulmonary disease) (HCC)   Hypomagnesemia   Edema  Peripheral vascular disease with non-healing ulcer of the left foot/BLE edema History as above Vascular surgery Dr. Oneida Ray on board, plan for bypass on 05/03/2018 CXR unremarkable ECHO pending Continue IV Lasix due to edema Hold clopidogrel pending surgery Strict I's and O's, daily weights Monitor on telemetry  Diabetes mellitus Cynthia 2 A1c 7.8% SSI, glipizide, Accu-Cheks Hold metformin  Cynthia Ray Continue amlodipine, lisinopril, metoprolol  Hyperlipidemia Continue statins  Ray Continue metoprolol, aspirin, statins Hold clopidogrel  Normocytic anemia Hemoglobin baseline around 10 Continue iron supplementation Daily CBC  Cynthia Ray Continue PPI     Code Status:  Full  Family Communication: None at bedside  Disposition Plan: Once work-up complete   Consultants:  Vascular surgery  Procedures:  None  Antimicrobials:  None  DVT prophylaxis: Enoxaparin   Objective: Vitals:   05/02/18 0000 05/02/18 0500 05/02/18 0751 05/02/18 0800  BP: (!) 140/52  (!) 146/56 (!) 132/58  Pulse: 85 81 86 85  Resp: 19 18  19   Temp: 98.2 F (36.8 C)   98.1 F (36.7 C)  TempSrc: Oral   Oral  SpO2: 92% 92%  94%  Weight:  59.9 kg    Height:        Intake/Output Summary (Last 24 hours) at 05/02/2018 1245 Last data filed at 05/02/2018 1200 Gross per 24 hour  Intake 740 ml  Output 2375 ml  Net -1635 ml   Filed Weights   04/30/18 1553 05/02/18 0500  Weight: 63.3 kg 59.9 kg    Exam:   General: NAD  Cardiovascular: S1, S2 present  Respiratory: CTA B  Abdomen: Soft, nontender, nondistended, bowel sounds present  Musculoskeletal: 1+ bilateral pitting edema, no palpable left pedal pulse  Skin: Multiple ulcers on left toes and foot  Psychiatry: Normal mood   Data Reviewed: CBC: Recent Labs  Lab 04/30/18 1646 05/01/18 0752 05/02/18 0503  WBC 6.2 5.3 5.4  NEUTROABS 3.9 2.7 2.9  HGB 9.2* 9.2* 9.0*  HCT 29.3* 29.2* 28.3*  MCV 89.6 88.2 87.6  PLT 323 338 527   Basic Metabolic Panel: Recent Labs  Lab 04/30/18 1646 05/01/18 0752 05/02/18 0503  NA 134* 139 138  K 3.5 3.5 3.6  CL 97* 99 99  CO2 28 28 28   GLUCOSE 203* 103* 170*  BUN 17 13 15   CREATININE 1.25* 1.18* 1.18*  CALCIUM 8.3* 8.7* 8.5*  MG 1.6*  2.0 1.7  PHOS 2.9  --   --    GFR: Estimated Creatinine Clearance: 26.2 mL/min (A) (by C-G formula based on SCr of 1.18 mg/dL (H)). Liver Function Tests: Recent Labs  Lab 04/30/18 1646  AST 14*  ALT 8  ALKPHOS 99  BILITOT 0.4  PROT 5.6*  ALBUMIN 2.6*   No results for input(s): LIPASE, AMYLASE in the last 168 hours. No results for input(s): AMMONIA in the last 168 hours. Coagulation Profile: No results for  input(s): INR, PROTIME in the last 168 hours. Cardiac Enzymes: No results for input(s): CKTOTAL, CKMB, CKMBINDEX, TROPONINI in the last 168 hours. BNP (last 3 results) No results for input(s): PROBNP in the last 8760 hours. HbA1C: Recent Labs    04/30/18 1904  HGBA1C 7.8*   CBG: Recent Labs  Lab 05/01/18 1111 05/01/18 1603 05/01/18 2047 05/02/18 0627 05/02/18 1105  GLUCAP 210* 210* 116* 165* 213*   Lipid Profile: No results for input(s): CHOL, HDL, LDLCALC, TRIG, CHOLHDL, LDLDIRECT in the last 72 hours. Thyroid Function Tests: No results for input(s): TSH, T4TOTAL, FREET4, T3FREE, THYROIDAB in the last 72 hours. Anemia Panel: No results for input(s): VITAMINB12, FOLATE, FERRITIN, TIBC, IRON, RETICCTPCT in the last 72 hours. Urine analysis:    Component Value Date/Time   COLORURINE YELLOW 03/25/2012 0929   APPEARANCEUR CLEAR 03/25/2012 0929   LABSPEC 1.008 03/25/2012 0929   PHURINE 6.0 03/25/2012 0929   GLUCOSEU NEGATIVE 03/25/2012 0929   HGBUR NEGATIVE 03/25/2012 0929   BILIRUBINUR NEGATIVE 03/25/2012 0929   KETONESUR NEGATIVE 03/25/2012 0929   PROTEINUR NEGATIVE 03/25/2012 0929   UROBILINOGEN 0.2 03/25/2012 0929   NITRITE NEGATIVE 03/25/2012 0929   LEUKOCYTESUR NEGATIVE 03/25/2012 0929   Sepsis Labs: @LABRCNTIP (procalcitonin:4,lacticidven:4)  ) Recent Results (from the past 240 hour(s))  MRSA PCR Screening     Status: None   Collection Time: 04/30/18  8:33 PM  Result Value Ref Range Status   MRSA by PCR NEGATIVE NEGATIVE Final    Comment:        The GeneXpert MRSA Assay (FDA approved for NASAL specimens only), is one component of a comprehensive MRSA colonization surveillance program. It is not intended to diagnose MRSA infection nor to guide or monitor treatment for MRSA infections. Performed at [facility], [place] [address], [place], [place] [address]       Studies: Dg Chest Port 1 View  Result Date: 05/01/2018 CLINICAL DATA:  Preop.   History of Cynthia Ray, diabetes, Cynthia Ray EXAM: PORTABLE CHEST 1 VIEW COMPARISON:  12/12/2017 FINDINGS: Linear atelectasis or scarring at the left base. Heart is upper limits normal in size. Right lung clear. No effusions or acute bony abnormality. IMPRESSION: Linear left base atelectasis or scarring. Electronically Signed   By: Rolm Baptise M.D.   On: 05/01/2018 14:32    Scheduled Meds: . amLODipine  10 mg Oral Daily  . aspirin  81 mg Oral Daily  . atorvastatin  80 mg Oral Daily  . calcium carbonate  1 tablet Oral Q breakfast  . cholecalciferol  1,000 Units Oral Daily  . enoxaparin (LOVENOX) injection  30 mg Subcutaneous Q24H  . furosemide  20 mg Intravenous BID  . glipiZIDE  5 mg Oral BID AC  . insulin aspart  0-15 Units Subcutaneous TID WC  . iron polysaccharides  150 mg Oral Daily  . lisinopril  40 mg Oral Daily  . magnesium oxide  400 mg Oral Daily  . metoprolol tartrate  25 mg Oral BID  . silver  sulfADIAZINE  1 application Topical Daily  . vitamin C  500 mg Oral Daily    Continuous Infusions: . [START ON 05/03/2018]  ceFAZolin (ANCEF) IV       LOS: 2 days     Alma Friendly, Cynthia [facility]   If 7PM-7AM, please contact night-coverage [email or web address] 05/02/2018, 12:45 PM

## 2018-05-02 NOTE — Progress Notes (Signed)
  Echocardiogram 2D Echocardiogram has been performed.  Darlina Sicilian M 05/02/2018, 2:51 PM

## 2018-05-03 ENCOUNTER — Encounter (HOSPITAL_COMMUNITY): Payer: Self-pay | Admitting: Orthopedic Surgery

## 2018-05-03 ENCOUNTER — Inpatient Hospital Stay (HOSPITAL_COMMUNITY): Payer: Medicare Other | Admitting: Anesthesiology

## 2018-05-03 ENCOUNTER — Encounter (HOSPITAL_COMMUNITY)
Admission: RE | Disposition: A | Discharge status: Rehabilitation Facility | Payer: Self-pay | Source: Home / Self Care | Attending: Internal Medicine

## 2018-05-03 DIAGNOSIS — I70245 Atherosclerosis of native arteries of left leg with ulceration of other part of foot: Secondary | ICD-10-CM

## 2018-05-03 HISTORY — PX: FEMORAL-TIBIAL BYPASS GRAFT: SHX938

## 2018-05-03 HISTORY — PX: PATCH ANGIOPLASTY: SHX6230

## 2018-05-03 HISTORY — PX: ENDARTERECTOMY FEMORAL: SHX5804

## 2018-05-03 LAB — GLUCOSE, CAPILLARY
Glucose-Capillary: 162 mg/dL — ABNORMAL HIGH (ref 70–99)
Glucose-Capillary: 129 mg/dL — ABNORMAL HIGH (ref 70–99)
Glucose-Capillary: 240 mg/dL — ABNORMAL HIGH (ref 70–99)
Glucose-Capillary: 284 mg/dL — ABNORMAL HIGH (ref 70–99)

## 2018-05-03 LAB — BASIC METABOLIC PANEL
Anion gap: 10 (ref 5–15)
BUN: 16 mg/dL (ref 8–23)
CO2: 28 mmol/L (ref 22–32)
Calcium: 8.2 mg/dL — ABNORMAL LOW (ref 8.9–10.3)
Chloride: 99 mmol/L (ref 98–111)
Creatinine, Ser: 1.2 mg/dL — ABNORMAL HIGH (ref 0.44–1.00)
GFR calc Af Amer: 45 mL/min — ABNORMAL LOW (ref >60)
GFR calc non Af Amer: 39 mL/min — ABNORMAL LOW (ref >60)
Glucose, Bld: 152 mg/dL — ABNORMAL HIGH (ref 70–99)
Potassium: 3.2 mmol/L — ABNORMAL LOW (ref 3.5–5.1)
Sodium: 137 mmol/L (ref 135–145)

## 2018-05-03 LAB — PREPARE RBC (CROSSMATCH)

## 2018-05-03 SURGERY — CREATION, BYPASS, ARTERIAL, FEMORAL TO TIBIAL, USING GRAFT
Anesthesia: General | Site: Leg Lower | Laterality: Left

## 2018-05-03 MED ORDER — ONDANSETRON HCL 4 MG/2ML IJ SOLN
INTRAMUSCULAR | Status: DC | PRN
  Administered 2018-05-03: 4 mg via INTRAVENOUS

## 2018-05-03 MED ORDER — BISACODYL 5 MG PO TBEC: 5.0000 mg | DELAYED_RELEASE_TABLET | Freq: Every day | ORAL | Status: DC | PRN

## 2018-05-03 MED ORDER — GUAIFENESIN-DM 100-10 MG/5ML PO SYRP: 15.0000 mL | ORAL_SOLUTION | ORAL | Status: DC | PRN

## 2018-05-03 MED ORDER — PHENYLEPHRINE HCL 10 MG/ML IJ SOLN
INTRAMUSCULAR | Status: DC | PRN
  Administered 2018-05-03: 80 ug via INTRAVENOUS
  Administered 2018-05-03: 160 ug via INTRAVENOUS
  Administered 2018-05-03 (×2): 80 ug via INTRAVENOUS

## 2018-05-03 MED ORDER — FENTANYL CITRATE (PF) 250 MCG/5ML IJ SOLN
INTRAMUSCULAR | Status: AC
  Filled 2018-05-03: qty 5

## 2018-05-03 MED ORDER — CEFAZOLIN SODIUM-DEXTROSE 2-4 GM/100ML-% IV SOLN: 2.0000 g | Freq: Three times a day (TID) | INTRAVENOUS | Status: DC

## 2018-05-03 MED ORDER — OXYCODONE HCL 5 MG PO TABS
5.0000 mg | ORAL_TABLET | ORAL | Status: DC | PRN
  Administered 2018-05-05: 5 mg via ORAL
  Filled 2018-05-03: qty 1

## 2018-05-03 MED ORDER — PANTOPRAZOLE SODIUM 40 MG PO TBEC
40.0000 mg | DELAYED_RELEASE_TABLET | Freq: Every day | ORAL | Status: DC
  Administered 2018-05-03 – 2018-05-04 (×2): 40 mg via ORAL
  Filled 2018-05-03 (×2): qty 1

## 2018-05-03 MED ORDER — ENOXAPARIN SODIUM 30 MG/0.3ML ~~LOC~~ SOLN
30.0000 mg | SUBCUTANEOUS | Status: DC
  Administered 2018-05-04 – 2018-05-05 (×2): 30 mg via SUBCUTANEOUS
  Filled 2018-05-03 (×2): qty 0.3

## 2018-05-03 MED ORDER — ONDANSETRON HCL 4 MG/2ML IJ SOLN: 4.0000 mg | Freq: Four times a day (QID) | INTRAMUSCULAR | Status: DC | PRN

## 2018-05-03 MED ORDER — SUGAMMADEX SODIUM 200 MG/2ML IV SOLN
INTRAVENOUS | Status: DC | PRN
  Administered 2018-05-03: 120 mg via INTRAVENOUS

## 2018-05-03 MED ORDER — DEXAMETHASONE SODIUM PHOSPHATE 10 MG/ML IJ SOLN
INTRAMUSCULAR | Status: DC | PRN
  Administered 2018-05-03: 10 mg via INTRAVENOUS

## 2018-05-03 MED ORDER — PROPOFOL 10 MG/ML IV BOLUS
INTRAVENOUS | Status: DC | PRN
  Administered 2018-05-03: 30 mg via INTRAVENOUS
  Administered 2018-05-03: 50 mg via INTRAVENOUS

## 2018-05-03 MED ORDER — PHENYLEPHRINE HCL 10 MG/ML IJ SOLN
INTRAMUSCULAR | Status: AC
  Filled 2018-05-03: qty 1

## 2018-05-03 MED ORDER — DEXAMETHASONE SODIUM PHOSPHATE 10 MG/ML IJ SOLN
INTRAMUSCULAR | Status: AC
  Filled 2018-05-03: qty 2

## 2018-05-03 MED ORDER — SODIUM CHLORIDE 0.9 % IV SOLN
INTRAVENOUS | Status: DC | PRN
  Administered 2018-05-03: 12:00:00

## 2018-05-03 MED ORDER — HEPARIN SODIUM (PORCINE) 1000 UNIT/ML IJ SOLN
INTRAMUSCULAR | Status: DC | PRN
  Administered 2018-05-03 (×2): 6000 [IU] via INTRAVENOUS
  Administered 2018-05-03: 3000 [IU] via INTRAVENOUS

## 2018-05-03 MED ORDER — SENNOSIDES-DOCUSATE SODIUM 8.6-50 MG PO TABS: 1.0000 | ORAL_TABLET | Freq: Every evening | ORAL | Status: DC | PRN

## 2018-05-03 MED ORDER — POTASSIUM CHLORIDE CRYS ER 20 MEQ PO TBCR
40.0000 meq | EXTENDED_RELEASE_TABLET | Freq: Two times a day (BID) | ORAL | Status: AC
  Administered 2018-05-03 (×2): 40 meq via ORAL
  Filled 2018-05-03 (×2): qty 2

## 2018-05-03 MED ORDER — ROCURONIUM BROMIDE 50 MG/5ML IV SOSY
PREFILLED_SYRINGE | INTRAVENOUS | Status: DC | PRN
  Administered 2018-05-03: 10 mg via INTRAVENOUS
  Administered 2018-05-03: 30 mg via INTRAVENOUS
  Administered 2018-05-03: 40 mg via INTRAVENOUS
  Administered 2018-05-03: 10 mg via INTRAVENOUS

## 2018-05-03 MED ORDER — PROTAMINE SULFATE 10 MG/ML IV SOLN
INTRAVENOUS | Status: DC | PRN
  Administered 2018-05-03: 50 mg via INTRAVENOUS

## 2018-05-03 MED ORDER — LACTATED RINGERS IV SOLN
INTRAVENOUS | Status: DC | PRN
  Administered 2018-05-03 (×3): via INTRAVENOUS

## 2018-05-03 MED ORDER — MORPHINE SULFATE (PF) 2 MG/ML IV SOLN: 0.5000 mg | INTRAVENOUS | Status: DC | PRN

## 2018-05-03 MED ORDER — PHENOL 1.4 % MT LIQD: 1.0000 | OROMUCOSAL | Status: DC | PRN

## 2018-05-03 MED ORDER — 0.9 % SODIUM CHLORIDE (POUR BTL) OPTIME
TOPICAL | Status: DC | PRN
  Administered 2018-05-03: 2000 mL

## 2018-05-03 MED ORDER — ONDANSETRON HCL 4 MG/2ML IJ SOLN: 4.0000 mg | Freq: Once | INTRAMUSCULAR | Status: DC | PRN

## 2018-05-03 MED ORDER — POTASSIUM CHLORIDE CRYS ER 20 MEQ PO TBCR: 20.0000 meq | EXTENDED_RELEASE_TABLET | Freq: Every day | ORAL | Status: DC | PRN

## 2018-05-03 MED ORDER — ONDANSETRON HCL 4 MG/2ML IJ SOLN
INTRAMUSCULAR | Status: AC
  Filled 2018-05-03: qty 4

## 2018-05-03 MED ORDER — MAGNESIUM SULFATE 2 GM/50ML IV SOLN: 2.0000 g | Freq: Every day | INTRAVENOUS | Status: DC | PRN

## 2018-05-03 MED ORDER — VASOPRESSIN 20 UNIT/ML IV SOLN
INTRAVENOUS | Status: AC
  Filled 2018-05-03: qty 1

## 2018-05-03 MED ORDER — LIDOCAINE 2% (20 MG/ML) 5 ML SYRINGE
INTRAMUSCULAR | Status: DC | PRN
  Administered 2018-05-03: 60 mg via INTRAVENOUS

## 2018-05-03 MED ORDER — CALCIUM CHLORIDE 10 % IV SOLN
INTRAVENOUS | Status: DC | PRN
  Administered 2018-05-03: 300 mg via INTRAVENOUS
  Administered 2018-05-03: 200 mg via INTRAVENOUS

## 2018-05-03 MED ORDER — ACETAMINOPHEN 325 MG PO TABS: 325.0000 mg | ORAL_TABLET | ORAL | Status: DC | PRN

## 2018-05-03 MED ORDER — PROTAMINE SULFATE 10 MG/ML IV SOLN
INTRAVENOUS | Status: AC
  Filled 2018-05-03: qty 5

## 2018-05-03 MED ORDER — LACTATED RINGERS IV SOLN
INTRAVENOUS | Status: DC
  Administered 2018-05-03: 10:00:00 via INTRAVENOUS

## 2018-05-03 MED ORDER — METOPROLOL TARTRATE 5 MG/5ML IV SOLN: 2.0000 mg | INTRAVENOUS | Status: DC | PRN

## 2018-05-03 MED ORDER — CEFAZOLIN SODIUM-DEXTROSE 2-4 GM/100ML-% IV SOLN
2.0000 g | Freq: Two times a day (BID) | INTRAVENOUS | Status: AC
  Administered 2018-05-03: 2 g via INTRAVENOUS
  Filled 2018-05-03: qty 100

## 2018-05-03 MED ORDER — ALBUMIN HUMAN 5 % IV SOLN
INTRAVENOUS | Status: DC | PRN
  Administered 2018-05-03 (×3): via INTRAVENOUS

## 2018-05-03 MED ORDER — FENTANYL CITRATE (PF) 100 MCG/2ML IJ SOLN: 25.0000 ug | INTRAMUSCULAR | Status: DC | PRN

## 2018-05-03 MED ORDER — LABETALOL HCL 5 MG/ML IV SOLN: 10.0000 mg | INTRAVENOUS | Status: DC | PRN

## 2018-05-03 MED ORDER — VASOPRESSIN 20 UNIT/ML IV SOLN
INTRAVENOUS | Status: DC | PRN
  Administered 2018-05-03 (×2): 1 [IU] via INTRAVENOUS

## 2018-05-03 MED ORDER — SODIUM CHLORIDE 0.9 % IV SOLN
INTRAVENOUS | Status: AC
  Filled 2018-05-03: qty 1.2

## 2018-05-03 MED ORDER — DOCUSATE SODIUM 100 MG PO CAPS
100.0000 mg | ORAL_CAPSULE | Freq: Every day | ORAL | Status: DC
  Administered 2018-05-04 – 2018-05-05 (×2): 100 mg via ORAL
  Filled 2018-05-03 (×2): qty 1

## 2018-05-03 MED ORDER — SODIUM CHLORIDE 0.9 % IV SOLN
INTRAVENOUS | Status: DC
  Administered 2018-05-03: 22:00:00 via INTRAVENOUS

## 2018-05-03 MED ORDER — HYDRALAZINE HCL 20 MG/ML IJ SOLN: 5.0000 mg | INTRAMUSCULAR | Status: DC | PRN

## 2018-05-03 MED ORDER — SUGAMMADEX SODIUM 500 MG/5ML IV SOLN
INTRAVENOUS | Status: AC
  Filled 2018-05-03: qty 5

## 2018-05-03 MED ORDER — PHENYLEPHRINE 40 MCG/ML (10ML) SYRINGE FOR IV PUSH (FOR BLOOD PRESSURE SUPPORT)
PREFILLED_SYRINGE | INTRAVENOUS | Status: AC
  Filled 2018-05-03: qty 10

## 2018-05-03 MED ORDER — ALUM & MAG HYDROXIDE-SIMETH 200-200-20 MG/5ML PO SUSP: 15.0000 mL | ORAL | Status: DC | PRN

## 2018-05-03 MED ORDER — ROCURONIUM BROMIDE 50 MG/5ML IV SOSY
PREFILLED_SYRINGE | INTRAVENOUS | Status: AC
  Filled 2018-05-03: qty 10

## 2018-05-03 MED ORDER — SODIUM CHLORIDE 0.9 % IV SOLN: 500.0000 mL | Freq: Once | INTRAVENOUS | Status: DC | PRN

## 2018-05-03 MED ORDER — HEMOSTATIC AGENTS (NO CHARGE) OPTIME
TOPICAL | Status: DC | PRN
  Administered 2018-05-03: 2 via TOPICAL

## 2018-05-03 MED ORDER — PROPOFOL 10 MG/ML IV BOLUS
INTRAVENOUS | Status: AC
  Filled 2018-05-03: qty 20

## 2018-05-03 MED ORDER — ROCURONIUM BROMIDE 50 MG/5ML IV SOSY
PREFILLED_SYRINGE | INTRAVENOUS | Status: AC
  Filled 2018-05-03: qty 5

## 2018-05-03 MED ORDER — EPHEDRINE SULFATE 50 MG/ML IJ SOLN
INTRAMUSCULAR | Status: DC | PRN
  Administered 2018-05-03: 10 mg via INTRAVENOUS

## 2018-05-03 MED ORDER — LIDOCAINE 2% (20 MG/ML) 5 ML SYRINGE
INTRAMUSCULAR | Status: AC
  Filled 2018-05-03: qty 10

## 2018-05-03 MED ORDER — EPHEDRINE 5 MG/ML INJ
INTRAVENOUS | Status: AC
  Filled 2018-05-03: qty 10

## 2018-05-03 MED ORDER — FENTANYL CITRATE (PF) 100 MCG/2ML IJ SOLN
INTRAMUSCULAR | Status: DC | PRN
  Administered 2018-05-03 (×6): 50 ug via INTRAVENOUS

## 2018-05-03 MED ORDER — SODIUM CHLORIDE 0.9 % IV SOLN
INTRAVENOUS | Status: DC | PRN
  Administered 2018-05-03: 80 ug/min via INTRAVENOUS
  Administered 2018-05-03: 60 ug/min via INTRAVENOUS

## 2018-05-03 MED ORDER — ACETAMINOPHEN 325 MG RE SUPP: 325.0000 mg | RECTAL | Status: DC | PRN

## 2018-05-03 MED ORDER — HEPARIN SODIUM (PORCINE) 1000 UNIT/ML IJ SOLN
INTRAMUSCULAR | Status: AC
  Filled 2018-05-03: qty 2

## 2018-05-03 SURGICAL SUPPLY — 70 items
ADH SKN CLS APL DERMABOND .7 (GAUZE/BANDAGES/DRESSINGS) ×10
AGENT HMST SPONGE THK3/8 (HEMOSTASIS) ×4
BANDAGE ESMARK 6X9 LF (GAUZE/BANDAGES/DRESSINGS) IMPLANT
BLADE SURG 10 STRL SS (BLADE) ×2 IMPLANT
BNDG CMPR 9X6 STRL LF SNTH (GAUZE/BANDAGES/DRESSINGS)
BNDG ESMARK 6X9 LF (GAUZE/BANDAGES/DRESSINGS)
CANISTER SUCT 3000ML PPV (MISCELLANEOUS) ×4 IMPLANT
CANNULA VESSEL 3MM 2 BLNT TIP (CANNULA) ×8 IMPLANT
CLIP VESOCCLUDE MED 24/CT (CLIP) ×4 IMPLANT
CLIP VESOCCLUDE SM WIDE 24/CT (CLIP) ×6 IMPLANT
COVER WAND RF STERILE (DRAPES) ×2 IMPLANT
CUFF TOURNIQUET SINGLE 24IN (TOURNIQUET CUFF) IMPLANT
CUFF TOURNIQUET SINGLE 34IN LL (TOURNIQUET CUFF) IMPLANT
CUFF TOURNIQUET SINGLE 44IN (TOURNIQUET CUFF) IMPLANT
DERMABOND ADVANCED (GAUZE/BANDAGES/DRESSINGS) ×10
DERMABOND ADVANCED .7 DNX12 (GAUZE/BANDAGES/DRESSINGS) ×2 IMPLANT
DRAIN SNY WOU (WOUND CARE) IMPLANT
DRAPE X-RAY CASS 24X20 (DRAPES) IMPLANT
DRSG COVADERM 4X8 (GAUZE/BANDAGES/DRESSINGS) ×2 IMPLANT
ELECT REM PT RETURN 9FT ADLT (ELECTROSURGICAL) ×4
ELECTRODE REM PT RTRN 9FT ADLT (ELECTROSURGICAL) ×2 IMPLANT
EVACUATOR SILICONE 100CC (DRAIN) IMPLANT
GAUZE SPONGE 4X4 16PLY XRAY LF (GAUZE/BANDAGES/DRESSINGS) ×4 IMPLANT
GLOVE BIO SURGEON STRL SZ 6.5 (GLOVE) ×2 IMPLANT
GLOVE BIO SURGEON STRL SZ7.5 (GLOVE) ×4 IMPLANT
GLOVE BIO SURGEONS STRL SZ 6.5 (GLOVE) ×2
GLOVE BIOGEL PI IND STRL 6.5 (GLOVE) IMPLANT
GLOVE BIOGEL PI IND STRL 7.0 (GLOVE) IMPLANT
GLOVE BIOGEL PI IND STRL 7.5 (GLOVE) IMPLANT
GLOVE BIOGEL PI INDICATOR 6.5 (GLOVE) ×4
GLOVE BIOGEL PI INDICATOR 7.0 (GLOVE) ×6
GLOVE BIOGEL PI INDICATOR 7.5 (GLOVE) ×2
GLOVE ECLIPSE 7.0 STRL STRAW (GLOVE) ×4 IMPLANT
GLOVE INDICATOR 7.5 STRL GRN (GLOVE) ×2 IMPLANT
GLOVE SS BIOGEL STRL SZ 7 (GLOVE) IMPLANT
GLOVE SUPERSENSE BIOGEL SZ 7 (GLOVE) ×2
GOWN STRL REUS W/ TWL LRG LVL3 (GOWN DISPOSABLE) ×6 IMPLANT
GOWN STRL REUS W/TWL LRG LVL3 (GOWN DISPOSABLE) ×20
GRAFT PROPATEN THIN WALL 6X40 (Vascular Products) ×2 IMPLANT
GRAFT VASC PATCH XENOSURE 1X14 (Vascular Products) ×2 IMPLANT
HEMOSTAT SPONGE AVITENE ULTRA (HEMOSTASIS) ×4 IMPLANT
KIT BASIN OR (CUSTOM PROCEDURE TRAY) ×4 IMPLANT
KIT TURNOVER KIT B (KITS) ×4 IMPLANT
LOOP VESSEL MINI RED (MISCELLANEOUS) ×4 IMPLANT
MARKER GRAFT CORONARY BYPASS (MISCELLANEOUS) ×2 IMPLANT
NS IRRIG 1000ML POUR BTL (IV SOLUTION) ×8 IMPLANT
PACK PERIPHERAL VASCULAR (CUSTOM PROCEDURE TRAY) ×4 IMPLANT
PAD ARMBOARD 7.5X6 YLW CONV (MISCELLANEOUS) ×8 IMPLANT
SET COLLECT BLD 21X3/4 12 (NEEDLE) IMPLANT
STOPCOCK 4 WAY LG BORE MALE ST (IV SETS) IMPLANT
SUT ETHILON 3 0 PS 1 (SUTURE) ×6 IMPLANT
SUT PROLENE 5 0 C 1 24 (SUTURE) ×8 IMPLANT
SUT PROLENE 6 0 CC (SUTURE) ×14 IMPLANT
SUT PROLENE 7 0 BV 1 (SUTURE) IMPLANT
SUT PROLENE 7 0 BV1 MDA (SUTURE) ×2 IMPLANT
SUT SILK 2 0 PERMA HAND 18 BK (SUTURE) ×4 IMPLANT
SUT SILK 2 0 SH (SUTURE) ×4 IMPLANT
SUT SILK 3 0 (SUTURE) ×8
SUT SILK 3-0 18XBRD TIE 12 (SUTURE) IMPLANT
SUT VIC AB 2-0 SH 27 (SUTURE) ×8
SUT VIC AB 2-0 SH 27XBRD (SUTURE) ×4 IMPLANT
SUT VIC AB 3-0 SH 27 (SUTURE) ×20
SUT VIC AB 3-0 SH 27X BRD (SUTURE) ×8 IMPLANT
SUT VICRYL 4-0 PS2 18IN ABS (SUTURE) ×16 IMPLANT
TAPE UMBILICAL COTTON 1/8X30 (MISCELLANEOUS) IMPLANT
TOWEL GREEN STERILE (TOWEL DISPOSABLE) ×4 IMPLANT
TRAY FOLEY MTR SLVR 16FR STAT (SET/KITS/TRAYS/PACK) ×4 IMPLANT
TUBING EXTENTION W/L.L. (IV SETS) IMPLANT
UNDERPAD 30X30 (UNDERPADS AND DIAPERS) ×4 IMPLANT
WATER STERILE IRR 1000ML POUR (IV SOLUTION) ×4 IMPLANT

## 2018-05-03 NOTE — Transfer of Care (Signed)
Immediate Anesthesia Transfer of Care Note  Patient: Cynthia Ray  Procedure(s) Performed: LEFT  FEMORAL TO ANTERIOR TIBIAL ARTERY BYPASS WITH COMPOSITE PTFE GRAFT AND REVERSED LEFT SAPHENOUS VEIN GRAFT. (Left Leg Lower) LEFT FEMORAL ENDARTERECTOMY WITH PROFUNDAPLASTY (Left Groin) PATCH ANGIOPLASTY LEFT FEMORAL ARTERY (Left Groin)  Patient Location: PACU  Anesthesia Type:General  Level of Consciousness: alert  and patient cooperative  Airway & Oxygen Therapy: Patient Spontanous Breathing and Patient connected to face mask oxygen  Post-op Assessment: Report given to RN and Post -op Vital signs reviewed and stable  Post vital signs: Reviewed and stable  Last Vitals:  Vitals Value Taken Time  BP 137/54 05/03/2018  7:06 PM  Temp    Pulse 79 05/03/2018  7:13 PM  Resp 12 05/03/2018  7:13 PM  SpO2 96 % 05/03/2018  7:13 PM  Vitals shown include unvalidated device data.  Last Pain:  Vitals:   05/03/18 1122  TempSrc:   PainSc: 0-No pain         Complications: No apparent anesthesia complications

## 2018-05-03 NOTE — Anesthesia Preprocedure Evaluation (Addendum)
Anesthesia Evaluation  Patient identified by MRN, date of birth, ID band Patient awake    Reviewed: Allergy & Precautions, NPO status , Patient's Chart, lab work & pertinent test results, reviewed documented beta blocker date and time   Airway Mallampati: II  TM Distance: >3 FB Neck ROM: Full    Dental  (+) Dental Advisory Given, Edentulous Lower, Edentulous Upper   Pulmonary COPD, Recent URI , Residual Cough, former smoker,    Pulmonary exam normal breath sounds clear to auscultation       Cardiovascular hypertension, Pt. on medications and Pt. on home beta blockers (-) angina+ CAD, + Cardiac Stents, + Peripheral Vascular Disease (left SFA stent; Right fem-pop bypass) and +CHF  (-) Past MI Normal cardiovascular exam Rhythm:Regular Rate:Normal  Echo 05/02/18: Study Conclusions  - Left ventricle: The cavity size was normal. Wall thickness was increased in a pattern of mild LVH. Systolic function was vigorous. The estimated ejection fraction was in the range of 65% to 70%. Wall motion was normal; there were no regional wall motion abnormalities. Doppler parameters are consistent with abnormal left ventricular relaxation (grade 1 diastolic dysfunction). Doppler parameters are consistent with high ventricular filling pressure. - Aortic valve: Trileaflet; moderately thickened, moderately   calcified leaflets. Valve mobility was restricted. Transvalvular velocity was within the normal range. There was no stenosis. - Mitral valve: There was mild regurgitation. - Tricuspid valve: There was mild regurgitation.   Neuro/Psych negative neurological ROS     GI/Hepatic Neg liver ROS, GERD  ,  Endo/Other  diabetes, Type 2, Oral Hypoglycemic Agents  Renal/GU Renal disease     Musculoskeletal  (+) Arthritis , Osteoarthritis,    Abdominal   Peds  Hematology  (+) Blood dyscrasia (Plavix), anemia ,   Anesthesia Other Findings Day of  surgery medications reviewed with the patient.  Reproductive/Obstetrics                            Anesthesia Physical Anesthesia Plan  ASA: III  Anesthesia Plan: General   Post-op Pain Management:    Induction: Intravenous  PONV Risk Score and Plan: 3 and Ondansetron, Dexamethasone and Treatment may vary due to age or medical condition  Airway Management Planned: Oral ETT  Additional Equipment:   Intra-op Plan:   Post-operative Plan: Extubation in OR  Informed Consent: I have reviewed the patients History and Physical, chart, labs and discussed the procedure including the risks, benefits and alternatives for the proposed anesthesia with the patient or authorized representative who has indicated his/her understanding and acceptance.   Dental advisory given and Dental Advisory Given  Plan Discussed with: CRNA and Anesthesiologist  Anesthesia Plan Comments: (Possible arterial line after induction.)       Anesthesia Quick Evaluation

## 2018-05-03 NOTE — Anesthesia Postprocedure Evaluation (Signed)
Anesthesia Post Note  Patient: Cynthia Ray  Procedure(s) Performed: LEFT  FEMORAL TO ANTERIOR TIBIAL ARTERY BYPASS WITH COMPOSITE PTFE GRAFT AND REVERSED LEFT SAPHENOUS VEIN GRAFT. (Left Leg Lower) LEFT FEMORAL ENDARTERECTOMY WITH PROFUNDAPLASTY (Left Groin) PATCH ANGIOPLASTY LEFT FEMORAL ARTERY (Left Groin)     Patient location during evaluation: PACU Anesthesia Type: General Level of consciousness: awake and alert Pain management: pain level controlled Vital Signs Assessment: post-procedure vital signs reviewed and stable Respiratory status: spontaneous breathing, nonlabored ventilation, respiratory function stable and patient connected to nasal cannula oxygen Cardiovascular status: blood pressure returned to baseline and stable Postop Assessment: no apparent nausea or vomiting Anesthetic complications: no    Last Vitals:  Vitals:   05/03/18 2006 05/03/18 2021  BP: (!) 100/54 (!) 94/52  Pulse: 76 79  Resp: 17 15  Temp:    SpO2: 100% 93%    Last Pain:  Vitals:   05/03/18 2030  TempSrc:   PainSc: 0-No pain                 Cynthia Ray,W. EDMOND

## 2018-05-03 NOTE — Interval H&P Note (Signed)
History and Physical Interval Note:  05/03/2018 10:45 AM  Cynthia Ray  has presented today for surgery, with the diagnosis of PERIPHERAL VASCULAR DISEASE WITH ULCER LEFT LOWER EXTREMITY  The various methods of treatment have been discussed with the patient and family. After consideration of risks, benefits and other options for treatment, the patient has consented to  Procedure(s): BYPASS GRAFT FEMORAL-ANTERIOR TIBIAL ARTERY LEFT LEG (Left) ENDARTERECTOMY FEMORAL LEFT (Left) as a surgical intervention .  The patient's history has been reviewed, patient examined, no change in status, stable for surgery.  I have reviewed the patient's chart and labs.  Questions were answered to the patient's satisfaction.     Ruta Hinds

## 2018-05-03 NOTE — Progress Notes (Signed)
Inpatient Rehabilitation Admissions Coordinator  Please place orders for PT and OT evals to assist [place] with completing an inpt rehab consult for appropriate rehab venue options to be determined.   Danne Baxter, RN, MSN Rehab Admissions Coordinator [phone] 05/03/2018 7:58 PM

## 2018-05-03 NOTE — Progress Notes (Signed)
PROGRESS NOTE  Cynthia Ray:[id number] DOB: 02-13-1926 DOA: 04/30/2018 PCP: Neale Burly, MD  HPI/Recap of past 84 hours: 84 year old female with medical history significant for CAD, COPD, GERD, hypertension, type II DM, PVD with history of right femoral-popliteal bypass who was referred by Dr. Oneida Alar for a left femoral bypass on 05/03/2018 for nonhealing ulcer of left foot secondary to PVD.  Of note, on 04/16/2018 patient had abdominal aortogram with bilateral lower extremity runoff which showed subtotal occlusion left distal common femoral artery, occlusion left superficial femoral and popliteal artery with patent previous bypass.  Patient admitted for possible diuresis prior to bypass as patient has significant bilateral lower extremity edema.  Patient admitted for further management.   Today, saw pt in PACU right after surgery, awake, drowsy, NAD. Unable to ROS   Assessment/Plan: Principal Problem:   PAD (peripheral artery disease) (HCC) Active Problems:   HTN (hypertension)   Dyslipidemia   CAD (coronary artery disease)   Anemia   Hyponatremia   GERD (gastroesophageal reflux disease)   Type II diabetes mellitus (HCC)   COPD (chronic obstructive pulmonary disease) (HCC)   Hypomagnesemia   Edema  Peripheral vascular disease with non-healing ulcer of the left foot/BLE edema s/p bypass History as above Vascular surgery Dr. Oneida Alar on board CXR unremarkable ECHO with an EF of 65%-70%, Grade 1DD Continue IV Lasix due to edema, will cut back in am Holding clopidogrel for surgery Strict I's and O's, daily weights Monitor on telemetry  Diabetes mellitus type 2 A1c 7.8% SSI, glipizide, Accu-Cheks Hold metformin  Hypertension Continue amlodipine, lisinopril, metoprolol  Hyperlipidemia Continue statins  CAD Continue metoprolol, aspirin, statins Hold clopidogrel  Normocytic anemia Hemoglobin baseline around 10 Continue iron supplementation Daily  CBC  GERD Continue PPI     Code Status: Full  Family Communication: None at bedside  Disposition Plan: TBD after surgery   Consultants:  Vascular surgery  Procedures:  Bypass  Antimicrobials:  None  DVT prophylaxis: Enoxaparin   Objective: Vitals:   05/03/18 0003 05/03/18 0408 05/03/18 0416 05/03/18 0953  BP: 140/77 (!) 157/61    Pulse: 83 84    Resp: 15     Temp: 98.2 F (36.8 C)     TempSrc: Oral     SpO2: 95% 92%    Weight:   57.6 kg 57.6 kg  Height:    5' (1.524 m)    Intake/Output Summary (Last 24 hours) at 05/03/2018 1559 Last data filed at 05/03/2018 1543 Gross per 24 hour  Intake 1304.99 ml  Output 4400 ml  Net -3095.01 ml   Filed Weights   05/02/18 0500 05/03/18 0416 05/03/18 0953  Weight: 59.9 kg 57.6 kg 57.6 kg    Exam:   General: NAD  Cardiovascular: S1, S2 present  Respiratory: CTA B  Abdomen: Soft, nontender, nondistended, bowel sounds present  Musculoskeletal: Dressing on LLE c/d/i  Skin: Multiple ulcers on left toes and foot  Psychiatry: Unable to assess   Data Reviewed: CBC: Recent Labs  Lab 04/30/18 1646 05/01/18 0752 05/02/18 0503 05/02/18 2218  WBC 6.2 5.3 5.4 6.2  NEUTROABS 3.9 2.7 2.9  --   HGB 9.2* 9.2* 9.0* 9.1*  HCT 29.3* 29.2* 28.3* 28.3*  MCV 89.6 88.2 87.6 88.4  PLT 323 338 313 811   Basic Metabolic Panel: Recent Labs  Lab 04/30/18 1646 05/01/18 0752 05/02/18 0503 05/02/18 2218 05/03/18 0324  NA 134* 139 138 136 137  K 3.5 3.5 3.6 3.3* 3.2*  CL 97* 99  99 98 99  CO2 28 28 28 31 28   GLUCOSE 203* 103* 170* 181* 152*  BUN 17 13 15 18 16   CREATININE 1.25* 1.18* 1.18* 1.29* 1.20*  CALCIUM 8.3* 8.7* 8.5* 8.4* 8.2*  MG 1.6* 2.0 1.7  --   --   PHOS 2.9  --   --   --   --    GFR: Estimated Creatinine Clearance: 25.2 mL/min (A) (by C-G formula based on SCr of 1.2 mg/dL (H)). Liver Function Tests: Recent Labs  Lab 04/30/18 1646 05/02/18 2218  AST 14* 15  ALT 8 10  ALKPHOS 99 87   BILITOT 0.4 0.5  PROT 5.6* 5.4*  ALBUMIN 2.6* 2.5*   No results for input(s): LIPASE, AMYLASE in the last 168 hours. No results for input(s): AMMONIA in the last 168 hours. Coagulation Profile: Recent Labs  Lab 05/02/18 2218  INR 1.05   Cardiac Enzymes: No results for input(s): CKTOTAL, CKMB, CKMBINDEX, TROPONINI in the last 168 hours. BNP (last 3 results) No results for input(s): PROBNP in the last 8760 hours. HbA1C: Recent Labs    04/30/18 1904  HGBA1C 7.8*   CBG: Recent Labs  Lab 05/02/18 1105 05/02/18 1556 05/02/18 2130 05/03/18 0609 05/03/18 0930  GLUCAP 213* 168* 182* 162* 129*   Lipid Profile: No results for input(s): CHOL, HDL, LDLCALC, TRIG, CHOLHDL, LDLDIRECT in the last 72 hours. Thyroid Function Tests: No results for input(s): TSH, T4TOTAL, FREET4, T3FREE, THYROIDAB in the last 72 hours. Anemia Panel: No results for input(s): VITAMINB12, FOLATE, FERRITIN, TIBC, IRON, RETICCTPCT in the last 72 hours. Urine analysis:    Component Value Date/Time   COLORURINE YELLOW 03/25/2012 0929   APPEARANCEUR CLEAR 03/25/2012 0929   LABSPEC 1.008 03/25/2012 0929   PHURINE 6.0 03/25/2012 0929   GLUCOSEU NEGATIVE 03/25/2012 0929   HGBUR NEGATIVE 03/25/2012 0929   BILIRUBINUR NEGATIVE 03/25/2012 0929   KETONESUR NEGATIVE 03/25/2012 0929   PROTEINUR NEGATIVE 03/25/2012 0929   UROBILINOGEN 0.2 03/25/2012 0929   NITRITE NEGATIVE 03/25/2012 0929   LEUKOCYTESUR NEGATIVE 03/25/2012 0929   Sepsis Labs: @LABRCNTIP (procalcitonin:4,lacticidven:4)  ) Recent Results (from the past 240 hour(s))  MRSA PCR Screening     Status: None   Collection Time: 04/30/18  8:33 PM  Result Value Ref Range Status   MRSA by PCR NEGATIVE NEGATIVE Final    Comment:        The GeneXpert MRSA Assay (FDA approved for NASAL specimens only), is one component of a comprehensive MRSA colonization surveillance program. It is not intended to diagnose MRSA infection nor to guide or monitor  treatment for MRSA infections. Performed at [facility], [place] [address] [place], [place] [address]       Studies: No results found.  Scheduled Meds: . [MAR Hold] amLODipine  10 mg Oral Daily  . [MAR Hold] aspirin  81 mg Oral Daily  . [MAR Hold] atorvastatin  80 mg Oral Daily  . [MAR Hold] calcium carbonate  1 tablet Oral Q breakfast  . [MAR Hold] cholecalciferol  1,000 Units Oral Daily  . [MAR Hold] enoxaparin (LOVENOX) injection  30 mg Subcutaneous Q24H  . [MAR Hold] furosemide  20 mg Intravenous BID  . [MAR Hold] glipiZIDE  5 mg Oral BID AC  . [MAR Hold] insulin aspart  0-15 Units Subcutaneous TID WC  . [MAR Hold] iron polysaccharides  150 mg Oral Daily  . [MAR Hold] lisinopril  40 mg Oral Daily  . [MAR Hold] magnesium oxide  400 mg Oral  Daily  . [MAR Hold] metoprolol tartrate  25 mg Oral BID  . [MAR Hold] potassium chloride  40 mEq Oral BID  . [MAR Hold] silver sulfADIAZINE  1 application Topical Daily  . [MAR Hold] vitamin C  500 mg Oral Daily    Continuous Infusions: . sodium chloride 10 mL/hr at 05/03/18 0313  .  ceFAZolin (ANCEF) IV    . lactated ringers 10 mL/hr at 05/03/18 1002     LOS: 3 days     Alma Friendly, MD [facility]   If 7PM-7AM, please contact night-coverage [email or web address] 05/03/2018, 3:59 PM

## 2018-05-03 NOTE — Op Note (Signed)
Procedure: Endarterectomy of left external iliac common femoral superficial femoral artery with profundoplasty (bovine pericardial patch), left femoral to anterior tibial artery bypass using composite 6 mm propaten PTFE and reversed greater saphenous vein  Preoperative diagnosis: Nonhealing wound left foot  Postoperative diagnosis: Same  Anesthesia: General  Assistant: Gerri Lins, PA-C  Operative findings: #1 bovine pericardial patch extending from the distal external iliac artery approximately 2 cm into the profunda   #2 6 mm PTFE extending from the bovine pericardial patch at the femoral artery to the mid thigh  #3 composite reversed greater saphenous vein extending from the mid thigh to the mid anterior tibial artery  #4.  Severely calcified arteries  Operative details: After obtaining informed consent, patient taken the operating.  The patient was placed in supine position operating table.  After induction of general anesthesia and endotracheal intubation its entire left lower extremities prepped and draped in usual sterile fashion.  Next a longitudinal incision was made in the left groin carried through the subtenons tissues down level left common femoral artery.  This was heavily calcified circumferentially.  Dissection was carried about 4 cm down onto the superficial femoral artery to reach an adequate spot for clamping that was not completely calcified.  I went down to the secondary branches of the profunda and these were all dissected free circumferentially and Vesseloops placed around them.  These were also calcified but it was slightly softer at the first branch point.  Dissection was then carried up underneath the inguinal ligament and several circumflex iliac branches were ligated and divided between silk ties to get adequate exposure of the distal external iliac artery to have a reasonable spot for clamping.  At this point attention was then turned to the left lateral calf.   Longitudinal incision was made in this location carried down through the subtenons tissues down level of the fascia.  Anterior tibialis muscle was reflected anteriorly the space was deepened down the level of the anterior tibial artery.  Again this was very heavily calcified but did have islands of soft spots in it.  Anterior tibial artery was dissected free circumferentially over several centimeters.  Several crossing veins were ligated and divided between silk ties.  Next a tunnel was created from the subfascial space of the anterior tibial artery up to the level of the groin tunneling out on the lateral leg and anteriorly over the thigh.  We then harvested the greater saphenous vein through the groin and multiple skip incisions down the medial aspect of the left leg.  The vein was of good quality to about the knee level that became fairly small.  The vein was about 3 mm in diameter above the knee.  I harvested the vein to the mid calf thinking I might be able to use part of this for a patch but it was quite small.  At this point the patient was given 6000 units of intravenous heparin.  She was given an additional 6000 unit heparin dose as well as a 3000 unit heparin dose during the course of the case.  The distal external iliac artery was controlled with a Cooley clamp.  The superficial femoral artery was controlled with a fistula clamp.  The profunda was controlled with multiple Vesseloops.  Longitudinal opening was made just above the femoral bifurcation and extended all the way up to the level of the external iliac artery.  A large amount of calcified plaque was within the artery obstructing nearly the entire lumen.  Endarterectomy  was performed extending from the distal external iliac artery down about 3 cm into the superficial femoral artery and there was some backbleeding from this.  A reasonable endpoint was obtained although the endarterectomy was done with eversion technique.  A good distal endpoint was  obtained in the profunda after extending the arteriotomy down to the tertiary branches.  A bovine pericardial patch was then brought up in the operative field a sudden onset patch angioplasty using a running 6-0 Prolene suture.  Just prior to completion of anastomosis it was forebled backbled and thoroughly flushed.  Anastomosis was secured clamps released there is pulsatile flow in the common femoral profunda femoris and superficial femoral arteries immediately with good Doppler flow.  At this point I measured the vein and found that it was not going to be long enough to extend from the common femoral artery to the anterior tibial artery.  Therefore a 6 mm propatent PTFE was brought up in the operative field and this was sewn end-to-end to the saphenous vein with the vein in a reverse configuration.  This was done with a running 6-0 Prolene suture.  Next the artery in the groin was re-controlled with a Cooley clamp and distal Vesseloops.  Longitudinal opening was made in the patch extending from just above the femoral bifurcation to make an adequate lumen for the PTFE graft.  The PTFE was then beveled and sewn endograft to side of artery using a running 6-0 Prolene suture.  Just prior to completion anastomosis it was forebled backbled and thoroughly flushed.  Anastomosis was secured clamps released there is good pulsatile flow in the bypass graft at this point.  The graft to vein anastomosis was reasonably hemostatic.  This was marked for orientation and brought through the tunnel down to the level of the anterior tibial artery.  The anterior tibial artery was controlled proximally distally with fine bulldog clamps.  Longitudinal opening was made in the anterior tibial artery which was about 2 mm in diameter.  Graft was cut the length beveled and sewn endograft to side of artery using a running 7-0 Prolene suture.  Just prior to completion of anastomosis it was forebled backbled and thoroughly flushed.  Anastomosis  was secured clamps released there is pulsatile flow in the bypass graft and good Doppler flow in the dorsalis pedis at the top of the foot which augmented about 75% with clamp and unclamp of the graft.  Hemostasis was obtained with direct pressure and Avitene and the assistance of 50 mg of protamine.  All the leg incisions from the vein harvest site were closed with multiple layers of running 3-0 and 4-0 Vicryl subcuticular stitch in the skin.  The anterior tibial artery exposure incision was closed with interrupted vertical mattress nylon sutures in the skin.  The groin was closed in multiple layers with a running 2 0 3 0 Vicryl suture and 4-0 Vicryl subcuticular stitch in the skin.  The patient tired procedure well and there were no complications.  The instrument sponge needle count was correct the end of the case.  The patient was taken to recovery room in stable condition.  Ruta Hinds, MD [facility] Office: [phone] Pager: [phone]

## 2018-05-03 NOTE — Anesthesia Procedure Notes (Signed)
Procedure Name: Intubation Date/Time: 05/03/2018 11:09 AM Performed by: Neldon Newport, CRNA Pre-anesthesia Checklist: Timeout performed, Patient being monitored, Suction available, Emergency Drugs available and Patient identified Patient Re-evaluated:Patient Re-evaluated prior to induction Oxygen Delivery Method: Circle system utilized Preoxygenation: Pre-oxygenation with 100% oxygen Induction Type: IV induction Ventilation: Mask ventilation without difficulty and Oral airway inserted - appropriate to patient size Laryngoscope Size: Mac and 3 Grade View: Grade I Tube type: Oral Tube size: 7.0 mm Number of attempts: 1 Placement Confirmation: breath sounds checked- equal and bilateral,  positive ETCO2 and ETT inserted through vocal cords under direct vision Secured at: 21 cm Tube secured with: Tape Dental Injury: Teeth and Oropharynx as per pre-operative assessment

## 2018-05-04 ENCOUNTER — Inpatient Hospital Stay (HOSPITAL_COMMUNITY): Payer: Medicare Other

## 2018-05-04 ENCOUNTER — Encounter (HOSPITAL_COMMUNITY): Payer: Self-pay | Admitting: Vascular Surgery

## 2018-05-04 DIAGNOSIS — I2510 Atherosclerotic heart disease of native coronary artery without angina pectoris: Secondary | ICD-10-CM

## 2018-05-04 DIAGNOSIS — I5032 Chronic diastolic (congestive) heart failure: Secondary | ICD-10-CM

## 2018-05-04 DIAGNOSIS — J449 Chronic obstructive pulmonary disease, unspecified: Secondary | ICD-10-CM

## 2018-05-04 DIAGNOSIS — I739 Peripheral vascular disease, unspecified: Secondary | ICD-10-CM

## 2018-05-04 DIAGNOSIS — E119 Type 2 diabetes mellitus without complications: Secondary | ICD-10-CM

## 2018-05-04 DIAGNOSIS — Z9889 Other specified postprocedural states: Secondary | ICD-10-CM

## 2018-05-04 LAB — PREPARE FRESH FROZEN PLASMA: Unit division: 0

## 2018-05-04 LAB — CBC WITH DIFFERENTIAL/PLATELET
Abs Immature Granulocytes: 0.1 10*3/uL — ABNORMAL HIGH (ref 0.00–0.07)
Basophils Absolute: 0 10*3/uL (ref 0.0–0.1)
Basophils Relative: 0 %
Eosinophils Absolute: 0 10*3/uL (ref 0.0–0.5)
Eosinophils Relative: 0 %
HCT: 29.8 % — ABNORMAL LOW (ref 36.0–46.0)
Hemoglobin: 10.3 g/dL — ABNORMAL LOW (ref 12.0–15.0)
Immature Granulocytes: 1 %
Lymphocytes Relative: 9 %
Lymphs Abs: 1.1 10*3/uL (ref 0.7–4.0)
MCH: 30.1 pg (ref 26.0–34.0)
MCHC: 34.6 g/dL (ref 30.0–36.0)
MCV: 87.1 fL (ref 80.0–100.0)
Monocytes Absolute: 1.3 10*3/uL — ABNORMAL HIGH (ref 0.1–1.0)
Monocytes Relative: 11 %
Neutro Abs: 9.7 10*3/uL — ABNORMAL HIGH (ref 1.7–7.7)
Neutrophils Relative %: 79 %
Platelets: 124 10*3/uL — ABNORMAL LOW (ref 150–400)
RBC: 3.42 MIL/uL — ABNORMAL LOW (ref 3.87–5.11)
RDW: 14.6 % (ref 11.5–15.5)
WBC: 12.2 10*3/uL — ABNORMAL HIGH (ref 4.0–10.5)

## 2018-05-04 LAB — GLUCOSE, CAPILLARY
Glucose-Capillary: 228 mg/dL — ABNORMAL HIGH (ref 70–99)
Glucose-Capillary: 188 mg/dL — ABNORMAL HIGH (ref 70–99)
Glucose-Capillary: 225 mg/dL — ABNORMAL HIGH (ref 70–99)
Glucose-Capillary: 229 mg/dL — ABNORMAL HIGH (ref 70–99)

## 2018-05-04 LAB — POCT I-STAT 4, (NA,K, GLUC, HGB,HCT)
Glucose, Bld: 232 mg/dL — ABNORMAL HIGH (ref 70–99)
Glucose, Bld: 268 mg/dL — ABNORMAL HIGH (ref 70–99)
HCT: 16 % — ABNORMAL LOW (ref 36.0–46.0)
HCT: 32 % — ABNORMAL LOW (ref 36.0–46.0)
Hemoglobin: 5.4 g/dL — CL (ref 12.0–15.0)
Hemoglobin: 10.9 g/dL — ABNORMAL LOW (ref 12.0–15.0)
Potassium: 4.6 mmol/L (ref 3.5–5.1)
Potassium: 4.8 mmol/L (ref 3.5–5.1)
Sodium: 138 mmol/L (ref 135–145)
Sodium: 138 mmol/L (ref 135–145)

## 2018-05-04 LAB — BPAM FFP
Blood Product Expiration Date: 201910082359
Blood Product Expiration Date: 201910082359
ISSUE DATE / TIME: 201910071812
ISSUE DATE / TIME: 201910071812
Unit Type and Rh: 6200
Unit Type and Rh: 6200

## 2018-05-04 LAB — BASIC METABOLIC PANEL
Anion gap: 9 (ref 5–15)
BUN: 17 mg/dL (ref 8–23)
CO2: 24 mmol/L (ref 22–32)
Calcium: 7.6 mg/dL — ABNORMAL LOW (ref 8.9–10.3)
Chloride: 103 mmol/L (ref 98–111)
Creatinine, Ser: 1.16 mg/dL — ABNORMAL HIGH (ref 0.44–1.00)
GFR calc Af Amer: 47 mL/min — ABNORMAL LOW (ref >60)
GFR calc non Af Amer: 40 mL/min — ABNORMAL LOW (ref >60)
Glucose, Bld: 252 mg/dL — ABNORMAL HIGH (ref 70–99)
Potassium: 5 mmol/L (ref 3.5–5.1)
Sodium: 136 mmol/L (ref 135–145)

## 2018-05-04 LAB — POCT ACTIVATED CLOTTING TIME: Activated Clotting Time: 202 seconds

## 2018-05-04 MED ORDER — FUROSEMIDE 40 MG PO TABS: 40.0000 mg | ORAL_TABLET | Freq: Every day | ORAL | Status: DC

## 2018-05-04 MED ORDER — FUROSEMIDE 40 MG PO TABS
40.0000 mg | ORAL_TABLET | Freq: Every day | ORAL | Status: DC
  Administered 2018-05-05: 40 mg via ORAL
  Filled 2018-05-04: qty 1

## 2018-05-04 MED ORDER — INSULIN ASPART 100 UNIT/ML ~~LOC~~ SOLN
3.0000 [IU] | Freq: Three times a day (TID) | SUBCUTANEOUS | Status: DC
  Administered 2018-05-04 – 2018-05-05 (×3): 3 [IU] via SUBCUTANEOUS

## 2018-05-04 NOTE — Care Management Note (Signed)
Case Management Note Marvetta Gibbons RN, BSN Transitions of Care Unit 4E- RN Case Manager [phone]  Patient Details  Name: Cynthia Ray MRN: [id number] Date of Birth: [birth year]  Subjective/Objective:  Pt admitted s/p bypass graft                   Action/Plan: PTA pt lived at home alone, with daughter and grandchildren checking in on her, has RW and 3n1 at home. PT eval ordered with recommendation for HHPT and initial 24hr supervision. CM to follow for transition of care needs.  Expected Discharge Date:                  Expected Discharge Plan:  [place]  In-House Referral:     Discharge planning Services  CM Consult  Post Acute Care Choice:  Home Health Choice offered to:     DME Arranged:    DME Agency:     HH Arranged:    [place] Agency:     Status of Service:  In process, will continue to follow  If discussed at Long Length of Stay Meetings, dates discussed:    Discharge Disposition:   Additional Comments:  Dawayne Patricia, RN 05/04/2018, 10:32 AM

## 2018-05-04 NOTE — Evaluation (Signed)
Physical Therapy Evaluation Patient Details Name: Cynthia Ray MRN: [id number] DOB: 1926/04/28 Today's Date: 05/04/2018   History of Present Illness  Cynthia Ray is a 84 y.o. female who previously underwent right femoral to popliteal artery bypass and amputation of the distal tip of right first toe in August 2013 by Dr. Oneida Alar. Pt with fall on 5/5 sustaining R hip fracture, s/p ORIF, went to SNF, d/c in july with bilat foot ulcers. Pt now POD # 1 right Fem-AT with hybrid vein and propaten.  Clinical Impression  Pt admitted for above. Pt was living home alone with freq check ins from daughter and grandchildren. Pt now functioning at minA level due to L LE pain with ambulation. Pt increased difficulty with sit to stand however anticipate pt to progress well and quickly. Recommend 24/7 initially due to extent of surgery on L LE. Acute PT to cont to follow.    Follow Up Recommendations Home health PT;Supervision/Assistance - 24 hour(for initial few days)    Equipment Recommendations  None recommended by PT    Recommendations for Other Services       Precautions / Restrictions Precautions Precautions: Fall Precaution Comments: non-healing bilat foot wounds Restrictions Weight Bearing Restrictions: No      Mobility  Bed Mobility Overal bed mobility: Needs Assistance Bed Mobility: Supine to Sit     Supine to sit: Supervision     General bed mobility comments: HOB elevated all the way up, pt states she sleeps and stays in her recliner due to inability of management LEs in/out of bed however pt able to today  Transfers Overall transfer level: Needs assistance Equipment used: Rolling walker (2 wheeled) Transfers: Sit to/from Stand Sit to Stand: Min assist         General transfer comment: pt requires modA from lower surface height, verbal cues to push up from bed  Ambulation/Gait Ambulation/Gait assistance: Min guard Gait Distance (Feet): 10 Feet(x1, 15x1, to/from  bathroom) Assistive device: Rolling walker (2 wheeled) Gait Pattern/deviations: Step-through pattern;Decreased stride length;Decreased weight shift to left Gait velocity: slow Gait velocity interpretation: <1.8 ft/sec, indicate of risk for recurrent falls General Gait Details: pt with antalgic L LE pain, increased bilat UE support, SpO2 >95% on RA  Stairs            Wheelchair Mobility    Modified Rankin (Stroke Patients Only)       Balance Overall balance assessment: Needs assistance Sitting-balance support: Feet supported;Single extremity supported Sitting balance-Leahy Scale: Good     Standing balance support: Bilateral upper extremity supported Standing balance-Leahy Scale: Poor Standing balance comment: pt stood at sink to wash hands but had to lean on sink with bilat elbows                             Pertinent Vitals/Pain Pain Assessment: 0-10 Pain Score: 4  Pain Location: L LE during ambulation Pain Descriptors / Indicators: Sore Pain Intervention(s): Monitored during session    Home Living Family/patient expects to be discharged to:: Private residence Living Arrangements: Alone Available Help at Discharge: Family;Available 24 hours/day(daughter lives across the street and pt has med alert button) Type of Home: House Home Access: Stairs to enter Entrance Stairs-Rails: None Entrance Stairs-Number of Steps: 1 Home Layout: One level Home Equipment: Environmental consultant - 2 wheels;Shower seat;Grab bars - toilet      Prior Function Level of Independence: Needs assistance   Gait / Transfers Assistance Needed: uses RW  ADL's / Homemaking Assistance Needed: daughter cooks her food and brings her meals, pt able to shower and dress self        Hand Dominance   Dominant Hand: Right    Extremity/Trunk Assessment   Upper Extremity Assessment Upper Extremity Assessment: LUE deficits/detail LUE Deficits / Details: pt with very limited shld ROM limiting  ability to use it functionally like pull self up off commode with hand rail    Lower Extremity Assessment Lower Extremity Assessment: Generalized weakness(bilaterally)    Cervical / Trunk Assessment Cervical / Trunk Assessment: Kyphotic  Communication   Communication: No difficulties  Cognition Arousal/Alertness: Awake/alert Behavior During Therapy: WFL for tasks assessed/performed Overall Cognitive Status: Within Functional Limits for tasks assessed                                        General Comments General comments (skin integrity, edema, etc.): bilat foot edema, multiple incision on L LE    Exercises General Exercises - Lower Extremity Ankle Circles/Pumps: AROM;Both;10 reps;Seated Long Arc Quad: AROM;Both;10 reps;Seated   Assessment/Plan    PT Assessment Patient needs continued PT services  PT Problem List Decreased strength;Decreased range of motion;Decreased activity tolerance;Decreased balance;Decreased mobility;Decreased coordination;Decreased cognition;Decreased knowledge of use of DME;Decreased safety awareness;Pain       PT Treatment Interventions DME instruction;Gait training;Stair training;Functional mobility training;Therapeutic activities;Therapeutic exercise;Balance training;Neuromuscular re-education    PT Goals (Current goals can be found in the Care Plan section)  Acute Rehab PT Goals Patient Stated Goal: home PT Goal Formulation: With patient Time For Goal Achievement: 05/18/18 Potential to Achieve Goals: Good    Frequency Min 3X/week   Barriers to discharge Decreased caregiver support(lives alone however states she could stay with her a few day)      Co-evaluation               AM-PAC PT "6 Clicks" Daily Activity  Outcome Measure Difficulty turning over in bed (including adjusting bedclothes, sheets and blankets)?: Unable Difficulty moving from lying on back to sitting on the side of the bed? : Unable Difficulty  sitting down on and standing up from a chair with arms (e.g., wheelchair, bedside commode, etc,.)?: Unable Help needed moving to and from a bed to chair (including a wheelchair)?: A Little Help needed walking in hospital room?: A Little Help needed climbing 3-5 steps with a railing? : A Lot 6 Click Score: 11    End of Session Equipment Utilized During Treatment: Gait belt Activity Tolerance: Patient tolerated treatment well Patient left: in chair;with call bell/phone within reach Nurse Communication: Mobility status PT Visit Diagnosis: Unsteadiness on feet (R26.81);Difficulty in walking, not elsewhere classified (R26.2);Pain Pain - Right/Left: Left Pain - part of body: Leg    Time: 8563-1497 PT Time Calculation (min) (ACUTE ONLY): 32 min   Charges:   PT Evaluation $PT Eval Moderate Complexity: 1 Mod PT Treatments $Gait Training: 8-22 mins        Kittie Plater, PT, DPT Acute Rehabilitation Services Pager #: [phone] Office #: [phone]   Berline Lopes 05/04/2018, 9:39 AM

## 2018-05-04 NOTE — Evaluation (Signed)
Occupational Therapy Evaluation Patient Details Name: Cynthia Ray MRN: [id number] DOB: [birth year] Today's Date: 05/04/2018    History of Present Illness Cynthia Ray is a 84 y.o. female who previously underwent right femoral to popliteal artery bypass and amputation of the distal tip of right first toe in August 2013 by Dr. Oneida Alar. Pt with fall on 5/5 sustaining R hip fracture, s/p ORIF, went to SNF, d/c in july with bilat foot ulcers. Pt now POD # 1 right Fem-AT with hybrid vein and propaten.   Clinical Impression   Pt admitted with above. She demonstrates the below listed deficits and will benefit from continued OT to maximize safety and independence with BADLs.  Pt presents to OT with generalized weakness, decreased activity tolerance, impaired balance, and impaired cognition.  She currently requires mod - max A for LB ADLs, and and min - mod A for functional mobility (increased amount of assistance as she fatigues).  Recommend CIR to allow her to maximize safety and independence with ADLs, reduce risk for falls, and readmission.  '    Follow Up Recommendations  CIR;Supervision/Assistance - 24 hour    Equipment Recommendations  None recommended by OT    Recommendations for Other Services       Precautions / Restrictions Precautions Precautions: Fall Precaution Comments: non-healing bilat foot wounds Restrictions Weight Bearing Restrictions: No      Mobility Bed Mobility Overal bed mobility: Needs Assistance Bed Mobility: Supine to Sit     Supine to sit: Supervision     General bed mobility comments: sitting up in chair   Transfers Overall transfer level: Needs assistance Equipment used: Rolling walker (2 wheeled) Transfers: Sit to/from [facility] Sit to Stand: Min assist;Mod assist Stand pivot transfers: Min assist;Min guard       General transfer comment: Pt unable to move sit to stand from recliner, without min A to boost into standing.   She required mod A to move sit to stand from commode.  Min guard assist for functional transfers, but decreased to min A as she fatigued     Balance Overall balance assessment: Needs assistance Sitting-balance support: Feet supported;Single extremity supported Sitting balance-Leahy Scale: Good     Standing balance support: Bilateral upper extremity supported Standing balance-Leahy Scale: Poor Standing balance comment: requires UE support                            ADL either performed or assessed with clinical judgement   ADL Overall ADL's : Needs assistance/impaired Eating/Feeding: Independent   Grooming: Wash/dry hands;Wash/dry face;Oral care;Brushing hair;Minimal assistance;Standing   Upper Body Bathing: Set up;Supervision/ safety;Sitting   Lower Body Bathing: Moderate assistance;Sit to/from stand   Upper Body Dressing : Supervision/safety;Set up;Sitting   Lower Body Dressing: Maximal assistance;Sit to/from stand   Toilet Transfer: Moderate assistance;Ambulation;Comfort height toilet;BSC;Grab bars;RW Armed forces technical officer Details (indicate cue type and reason): assist to boost up from toilet  Toileting- Clothing Manipulation and Hygiene: Moderate assistance;Sit to/from stand       Functional mobility during ADLs: Minimal assistance;Rolling walker General ADL Comments: DOE 3/4 after ambulating to/from bathroom      Vision         Perception     Praxis      Pertinent Vitals/Pain Pain Assessment: No/denies pain Pain Score: 4  Pain Location: L LE during ambulation Pain Descriptors / Indicators: Sore Pain Intervention(s): Monitored during session     Hand Dominance Right  Extremity/Trunk Assessment Upper Extremity Assessment Upper Extremity Assessment: Generalized weakness;LUE deficits/detail LUE Deficits / Details: Pt with limited shoulder ROM    Lower Extremity Assessment Lower Extremity Assessment: Generalized weakness   Cervical / Trunk  Assessment Cervical / Trunk Assessment: Kyphotic   Communication Communication Communication: No difficulties   Cognition Arousal/Alertness: Awake/alert Behavior During Therapy: WFL for tasks assessed/performed Overall Cognitive Status: Impaired/Different from baseline Area of Impairment: Memory;Safety/judgement;Problem solving                     Memory: Decreased short-term memory   Safety/Judgement: Decreased awareness of safety;Decreased awareness of deficits   Problem Solving: Difficulty sequencing;Requires verbal cues General Comments: Pt with poor awareness of current deficits - pt thinks she will be back to baseline upon return home.  Requires information repeated    General Comments  Long discussion with pt re: her current level of functioning and need for 24/7 assist at discharge, she doesn't seem to understand that she will not be at her baseline upon return home states "honey, I could do that fine when I am at home", and then "I need to eat more" as solution to her weakness     Exercises Exercises: General Lower Extremity General Exercises - Lower Extremity Ankle Circles/Pumps: AROM;Both;10 reps;Seated Long Arc Quad: AROM;Both;10 reps;Seated   Shoulder Instructions      Home Living Family/patient expects to be discharged to:: Private residence Living Arrangements: Alone Available Help at Discharge: Family;Available 24 hours/day Type of Home: House Home Access: Stairs to enter [employer] of Steps: 1 Entrance Stairs-Rails: None Home Layout: One level     Bathroom Shower/Tub: Occupational psychologist: Standard Bathroom Accessibility: Yes   Home Equipment: Environmental consultant - 2 wheels;Shower seat;Grab bars - toilet          Prior Functioning/Environment Level of Independence: Needs assistance  Gait / Transfers Assistance Needed: uses RW ADL's / Homemaking Assistance Needed: Pt reports daughter assists with donning socks and shoes              OT Problem List: Decreased strength;Decreased activity tolerance;Impaired balance (sitting and/or standing);Decreased cognition;Decreased safety awareness;Decreased knowledge of use of DME or AE      OT Treatment/Interventions: Self-care/ADL training;Therapeutic exercise;DME and/or AE instruction;Therapeutic activities;Cognitive remediation/compensation;Patient/family education;Balance training    OT Goals(Current goals can be found in the care plan section) Acute Rehab OT Goals Patient Stated Goal: The doctor said I can go home if I can walk more  OT Goal Formulation: With patient Time For Goal Achievement: 05/18/18 Potential to Achieve Goals: Good ADL Goals Pt Will Perform Grooming: with min guard assist;standing Pt Will Perform Lower Body Bathing: with min assist;sit to/from stand Pt Will Perform Lower Body Dressing: with min assist;sit to/from stand Pt Will Transfer to Toilet: with min guard assist;ambulating;regular height toilet;bedside commode;grab bars Pt Will Perform Toileting - Clothing Manipulation and hygiene: with min guard assist;sit to/from stand  OT Frequency: Min 2X/week   Barriers to D/C: Decreased caregiver support          Co-evaluation              AM-PAC PT "6 Clicks" Daily Activity     Outcome Measure Help from another person eating meals?: None Help from another person taking care of personal grooming?: A Little Help from another person toileting, which includes using toliet, bedpan, or urinal?: A Lot Help from another person bathing (including washing, rinsing, drying)?: A Lot Help from another person to put on  and taking off regular upper body clothing?: A Little Help from another person to put on and taking off regular lower body clothing?: A Lot 6 Click Score: 16   End of Session Equipment Utilized During Treatment: Rolling walker Nurse Communication: Mobility status  Activity Tolerance: Patient tolerated treatment well Patient left:  in chair;with call bell/phone within reach  OT Visit Diagnosis: Unsteadiness on feet (R26.81)                Time: 6578-4696 OT Time Calculation (min): 19 min Charges:  OT General Charges $OT Visit: 1 Visit OT Evaluation $OT Eval Moderate Complexity: 1 Mod  Lucille Passy, OTR/L [place] Pager [phone] Office [phone]   Lucille Passy M 05/04/2018, 12:54 PM

## 2018-05-04 NOTE — Care Management Important Message (Signed)
Important Message  Patient Details  Name: BELANNA MANRING MRN: [id number] Date of Birth: 1925/09/20   Medicare Important Message Given:  Yes    Darien Kading P Simpson Paulos 05/04/2018, 3:18 PM

## 2018-05-04 NOTE — Progress Notes (Addendum)
Inpatient Rehabilitation-Admissions Coordinator   Spoke with pt at the bedside as follow up from PM&R consult. OT discussed program and expectations. Pt wants to go directly home at this time, despite requiring Min/Mod A for standing and requiring assistance for ADLs. AC contacted daughter (with pt permission) to discuss pt's current functional level and recommended post acute care as pt would be returning home alone with intermittent assist from her daugther. Pt's daughter plans to come by the hospital tomorrow to discuss DC plans.   Please call if questions.   Jhonnie Garner, OTR/L  Rehab Admissions Coordinator  [phone] 05/04/2018 3:07 PM

## 2018-05-04 NOTE — Progress Notes (Addendum)
[facility]  Subjective  - Doing well over all, wants to try and return home with home health.   Objective (!) 124/53 80 98 F (36.7 C) (Oral) 18 98%  Intake/Output Summary (Last 24 hours) at 05/04/2018 0729 Last data filed at 05/04/2018 9233 Gross per 24 hour  Intake 4357.78 ml  Output 2490 ml  Net 1867.78 ml    Feet warm with intact sensation and active range of motion. Brisk doppler AT/DP B LE Lateral left leg graft pulse Heart RRR Lungs non labored breathing  Assessment/Planning: POD # 1 right Fem-AT with hybrid vein and propaten   Patent by pass with brisk doppler signals PT/OT eval and treat patient has rolling walker and 3 in 1 at home. Consult social work for possible SNF pending recovery. Lab redraw pending, low plt on first draw. S/P 4 units PRBC and FFP pre-op chronic anemia and surgical blood loss anemia.    Roxy Horseman 05/04/2018 7:29 AM --  2+ graft pulse laterally Incisions clean Foot warm Home or rehab when ambulatory and pain controlled  Ruta Hinds, MD [facility] [place]: [phone] Pager: [phone]  Laboratory Lab Results: Recent Labs    05/02/18 2218 05/03/18 2028 05/04/18 0513  WBC 6.2  --  12.2*  HGB 9.1* 10.9* 10.3*  HCT 28.3* 32.0* 29.8*  PLT 323  --  124*   BMET Recent Labs    05/03/18 0324 05/03/18 2028 05/04/18 0342  NA 137 138 136  K 3.2* 4.8 5.0  CL 99  --  103  CO2 28  --  24  GLUCOSE 152* 268* 252*  BUN 16  --  17  CREATININE 1.20*  --  1.16*  CALCIUM 8.2*  --  7.6*    COAG Lab Results  Component Value Date   INR 1.05 05/02/2018   INR 1.08 03/17/2012   INR 1.03 01/19/2012   No results found for: PTT

## 2018-05-04 NOTE — Progress Notes (Signed)
PROGRESS NOTE  Cynthia Ray CVE:[id number] DOB: Jun 13, 1926 DOA: 04/30/2018 PCP: Neale Burly, MD  HPI/Recap of past 31 hours: 84 year old female with medical history significant for CAD, COPD, GERD, hypertension, type II DM, PVD with history of right femoral-popliteal bypass who was referred by Dr. Oneida Alar for a left femoral bypass on 05/03/2018 for nonhealing ulcer of left foot secondary to PVD.  Of note, on 04/16/2018 patient had abdominal aortogram with bilateral lower extremity runoff which showed subtotal occlusion left distal common femoral artery, occlusion left superficial femoral and popliteal artery with patent previous bypass.  Patient admitted for possible diuresis prior to bypass as patient has significant bilateral lower extremity edema.  Patient admitted for further management.   Today, pt reported feeling much better, denies any chest pain, SOB, abdominal pain, fever/chills.  Assessment/Plan: Principal Problem:   PAD (peripheral artery disease) (HCC) Active Problems:   HTN (hypertension)   Dyslipidemia   CAD (coronary artery disease)   Anemia   Hyponatremia   GERD (gastroesophageal reflux disease)   Type II diabetes mellitus (HCC)   COPD (chronic obstructive pulmonary disease) (HCC)   Hypomagnesemia   Edema   Chronic diastolic congestive heart failure ([place])   Diabetes mellitus type 2 in nonobese [facility])  Peripheral vascular disease with non-healing ulcer of the left foot/BLE edema s/p Endarterectomy of left external iliac common femoral superficial femoral artery with profundoplasty, left femoral to anterior tibial artery bypass on 05/03/18 History as above S/P 4U of PRBC and FFP due to surgical blood loss Vascular surgery Dr. Oneida Alar on board CXR unremarkable ECHO with an EF of 65%-70%, Grade 1DD Switch to PO Lasix due to BLE edema, s/p IV lasix Due to significant blood loss, will continue to hold plavix until its ok to re-start as per Vascular Monitor closely in  SDU  Diabetes mellitus type 2 A1c 7.8% SSI, glipizide, Accu-Cheks Hold metformin  Hypertension Continue amlodipine, lisinopril, metoprolol  Hyperlipidemia Continue statins  CAD Continue metoprolol, aspirin, statins Hold clopidogrel  Normocytic anemia/acute blood loss anemia from surgery Hemoglobin baseline around 10 S/P 4U of PRBC and FFP Continue iron supplementation Daily CBC  GERD Continue PPI     Code Status: Full  Family Communication: None at bedside  Disposition Plan: SNF Vs CIR   Consultants:  Vascular surgery  Procedures:  LE bypass as above on 05/03/18   Antimicrobials:  None  DVT prophylaxis: Enoxaparin   Objective: Vitals:   05/04/18 0358 05/04/18 0806 05/04/18 0959 05/04/18 1226  BP: (!) 124/53 (!) 140/54 (!) 114/52 (!) 109/56  Pulse: 80 82 92 79  Resp: 18 18 18 20   Temp: 98 F (36.7 C) 97.8 F (36.6 C)  97.9 F (36.6 C)  TempSrc: Oral Oral  Oral  SpO2: 98% 100% 100% 97%  Weight:      Height:        Intake/Output Summary (Last 24 hours) at 05/04/2018 1504 Last data filed at 05/04/2018 0614 Gross per 24 hour  Intake 3357.78 ml  Output 1390 ml  Net 1967.78 ml   Filed Weights   05/02/18 0500 05/03/18 0416 05/03/18 0953  Weight: 59.9 kg 57.6 kg 57.6 kg    Exam:   General: NAD  Cardiovascular: S1, S2 present  Respiratory: CTA B  Abdomen: Soft, nontender, nondistended, bowel sounds present  Musculoskeletal: Dressing and incision on LLE c/d/i  Skin: Multiple ulcers on left toes and foot  Psychiatry: Normal mood   Data Reviewed: CBC: Recent Labs  Lab 04/30/18 1646 05/01/18 0752  05/02/18 0503 05/02/18 2218 05/03/18 1753 05/03/18 2028 05/04/18 0513  WBC 6.2 5.3 5.4 6.2  --   --  12.2*  NEUTROABS 3.9 2.7 2.9  --   --   --  9.7*  HGB 9.2* 9.2* 9.0* 9.1* 5.4* 10.9* 10.3*  HCT 29.3* 29.2* 28.3* 28.3* 16.0* 32.0* 29.8*  MCV 89.6 88.2 87.6 88.4  --   --  87.1  PLT 323 338 313 323  --   --  124*   Basic  Metabolic Panel: Recent Labs  Lab 04/30/18 1646 05/01/18 0752 05/02/18 0503 05/02/18 2218 05/03/18 0324 05/03/18 1753 05/03/18 2028 05/04/18 0342  NA 134* 139 138 136 137 138 138 136  K 3.5 3.5 3.6 3.3* 3.2* 4.6 4.8 5.0  CL 97* 99 99 98 99  --   --  103  CO2 28 28 28 31 28   --   --  24  GLUCOSE 203* 103* 170* 181* 152* 232* 268* 252*  BUN 17 13 15 18 16   --   --  17  CREATININE 1.25* 1.18* 1.18* 1.29* 1.20*  --   --  1.16*  CALCIUM 8.3* 8.7* 8.5* 8.4* 8.2*  --   --  7.6*  MG 1.6* 2.0 1.7  --   --   --   --   --   PHOS 2.9  --   --   --   --   --   --   --    GFR: Estimated Creatinine Clearance: 26.1 mL/min (A) (by C-G formula based on SCr of 1.16 mg/dL (H)). Liver Function Tests: Recent Labs  Lab 04/30/18 1646 05/02/18 2218  AST 14* 15  ALT 8 10  ALKPHOS 99 87  BILITOT 0.4 0.5  PROT 5.6* 5.4*  ALBUMIN 2.6* 2.5*   No results for input(s): LIPASE, AMYLASE in the last 168 hours. No results for input(s): AMMONIA in the last 168 hours. Coagulation Profile: Recent Labs  Lab 05/02/18 2218  INR 1.05   Cardiac Enzymes: No results for input(s): CKTOTAL, CKMB, CKMBINDEX, TROPONINI in the last 168 hours. BNP (last 3 results) No results for input(s): PROBNP in the last 8760 hours. HbA1C: No results for input(s): HGBA1C in the last 72 hours. CBG: Recent Labs  Lab 05/03/18 0930 05/03/18 1928 05/03/18 2103 05/04/18 0623 05/04/18 1113  GLUCAP 129* 240* 284* 228* 188*   Lipid Profile: No results for input(s): CHOL, HDL, LDLCALC, TRIG, CHOLHDL, LDLDIRECT in the last 72 hours. Thyroid Function Tests: No results for input(s): TSH, T4TOTAL, FREET4, T3FREE, THYROIDAB in the last 72 hours. Anemia Panel: No results for input(s): VITAMINB12, FOLATE, FERRITIN, TIBC, IRON, RETICCTPCT in the last 72 hours. Urine analysis:    Component Value Date/Time   COLORURINE YELLOW 03/25/2012 0929   APPEARANCEUR CLEAR 03/25/2012 0929   LABSPEC 1.008 03/25/2012 0929   PHURINE 6.0  03/25/2012 0929   GLUCOSEU NEGATIVE 03/25/2012 0929   HGBUR NEGATIVE 03/25/2012 0929   BILIRUBINUR NEGATIVE 03/25/2012 0929   KETONESUR NEGATIVE 03/25/2012 0929   PROTEINUR NEGATIVE 03/25/2012 0929   UROBILINOGEN 0.2 03/25/2012 0929   NITRITE NEGATIVE 03/25/2012 0929   LEUKOCYTESUR NEGATIVE 03/25/2012 0929   Sepsis Labs: @LABRCNTIP (procalcitonin:4,lacticidven:4)  ) Recent Results (from the past 240 hour(s))  MRSA PCR Screening     Status: None   Collection Time: 04/30/18  8:33 PM  Result Value Ref Range Status   MRSA by PCR NEGATIVE NEGATIVE Final    Comment:        The GeneXpert MRSA Assay (FDA  approved for NASAL specimens only), is one component of a comprehensive MRSA colonization surveillance program. It is not intended to diagnose MRSA infection nor to guide or monitor treatment for MRSA infections. Performed at [facility], [place] [address] [place], [place] [address]       Studies: No results found.  Scheduled Meds: . amLODipine  10 mg Oral Daily  . aspirin  81 mg Oral Daily  . atorvastatin  80 mg Oral Daily  . calcium carbonate  1 tablet Oral Q breakfast  . cholecalciferol  1,000 Units Oral Daily  . docusate sodium  100 mg Oral Daily  . enoxaparin (LOVENOX) injection  30 mg Subcutaneous Q24H  . furosemide  20 mg Intravenous BID  . glipiZIDE  5 mg Oral BID AC  . insulin aspart  0-15 Units Subcutaneous TID WC  . insulin aspart  3 Units Subcutaneous TID WC  . iron polysaccharides  150 mg Oral Daily  . lisinopril  40 mg Oral Daily  . magnesium oxide  400 mg Oral Daily  . metoprolol tartrate  25 mg Oral BID  . pantoprazole  40 mg Oral Q2200  . silver sulfADIAZINE  1 application Topical Daily  . vitamin C  500 mg Oral Daily    Continuous Infusions: . sodium chloride    . sodium chloride Stopped (05/04/18 0807)  . lactated ringers 10 mL/hr at 05/03/18 1002  . magnesium sulfate 1 - 4 g bolus IVPB       LOS: 4 days     Alma Friendly,  MD [facility]   If 7PM-7AM, please contact night-coverage [email or web address] 05/04/2018, 3:04 PM

## 2018-05-04 NOTE — Consult Note (Signed)
            [facility] CM Primary Care Navigator  05/04/2018  Cynthia Ray [birth year] [id number]   Seen patient atthe bedsideto identify possible discharge needs.  Patientreports thatshe had nonhealing wound/ ulcers to her left foot that resulted to thisadmission/ surgery.(subtotal occlusion left distal femoral artery as well as occlusion of left superficial femoral-popliteal artery, underwent endarterectomy of left external iliac common femoral superficial femoral artery with profundoplasty left femoral to anterior tibial artery bypass)  Patient endorsesDr. Stoney Bang with [facility] in [place] primary care provider.   [facility] [place] [facility].  Patient's daughter (Toni)has beenmanaginghermedications at home with use of "pill box" system filled once a week.  Her daughter has been providingtransportation to her doctors' appointments.  Patientlives at home alone. Daughter (lives across the street) and grandchildren have been checking in on her and assist her when needed.   Anticipated plan for discharge isSNF- skilled nursing facility vs. CIR- [facility] per MD note. Patient's daughter plans to come by the hospital tomorrow to discuss discharge plans per Rehab coordinator note.  Patientvoiced understandingto callprimary careprovider'soffice when she returns backhomefor a post discharge follow-upvisitwithin1- 2 weeksor sooner if needs arise.Patient letter (with PCP's contact number) was provided as areminder.   Explained topatientregarding [facility] CM services available for health management andresourcesat homeand seemed interested about it.  Patient is aware to discuss with primary care provider on her next visit, for further needs and assistance in managing health issues- once she gets  home. Patienthadverbalizedunderstandingof needto seekreferral from primary care provider to [facility] care management ifdeemed necessary and appropriatefor anyservicesin the future,when she isback home.  [facility] care management information was provided for futureneeds thatshemay have.  Primary care provider's office is listed as providing transition of care (TOC) follow-up.   For additional questions please contact:  Edwena Felty A. Dacen Frayre, BSN, RN-BC [facility] PRIMARY CARE Navigator Cell: [phone]

## 2018-05-04 NOTE — Consult Note (Signed)
Physical Medicine and Rehabilitation Consult Reason for Consult: Decreased functional mobility Referring Physician: [facility]   HPI: Cynthia Ray is a 84 y.o. right-handed female with history of diastolic congestive heart, CAD with stenting, failure, COPD, type 2 diabetes mellitus, right hip fracture May 2019 and received therapies at a skilled nursing facility, PVD with left SFA stenting maintained on Plavix and aspirin as well as 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  Per chart review and patient, patient lives alone in [place], [place].  Used a walker prior to admission and was receiving home health therapies.  Daughter across the street provides assistance as needed.  One level home.  Presented 04/30/2018 with nonhealing ulcers of the left foot.  Recent aortogram showed subtotal occlusion left distal femoral artery as well as occlusion left superficial femoral-popliteal artery.  Underwent endarterectomy of left external iliac common femoral superficial femoral artery with profundoplasty, left femoral to anterior tibial artery bypass 05/03/2018 per Dr. Oneida Alar.  Hospital course leukocytosis.  Subcutaneous Lovenox for DVT prophylaxis.  Therapy evaluations pending.  MD has requested physical medicine rehab consult.  Review of Systems  Constitutional: Positive for malaise/fatigue. Negative for chills and fever.  HENT: Negative for hearing loss.   Eyes: Negative for blurred vision and double vision.  Respiratory: Positive for shortness of breath.   Cardiovascular: Positive for leg swelling. Negative for chest pain.  Gastrointestinal: Positive for constipation. Negative for nausea and vomiting.       GERD  Genitourinary: Negative for dysuria, flank pain and hematuria.  Musculoskeletal: Positive for myalgias.  Skin: Negative for rash.  Neurological: Positive for weakness.  All other systems reviewed and are negative.  Past Medical History:  Diagnosis Date  . Anemia   .  Arthritis    "dr said I have it in my back" [place]   . Carotid artery occlusion   . CHF (congestive heart failure) ([place]) 12/2017  . COPD (chronic obstructive pulmonary disease) ([place])   . Fractured hip ([place])    Fell and hurt R hip  . GERD (gastroesophageal reflux disease)   . History of blood transfusion 11/11/11  . Hyperlipidemia   . Hypertension   . PVD (peripheral vascular disease) ([place])    Stents left SFA 2004 Dr. Albertine Patricia  . Type II diabetes mellitus ([place])    Past Surgical History:  Procedure Laterality Date  . ABDOMINAL AORTAGRAM N/A 10/31/2011   Procedure: ABDOMINAL Maxcine Ham;  Surgeon: Elam Dutch, MD;  Location: [facility] CATH LAB;  Service: Cardiovascular;  Laterality: N/A;  . ABDOMINAL AORTOGRAM W/LOWER EXTREMITY N/A 04/16/2018   Procedure: ABDOMINAL AORTOGRAM W/LOWER EXTREMITY;  Surgeon: Elam Dutch, MD;  Location: [facility] CV LAB;  Service: Cardiovascular;  Laterality: N/A;  . AMPUTATION  03/23/2012   Procedure: AMPUTATION DIGIT;  Surgeon: Elam Dutch, MD;  Location: [facility] OR;  Service: Vascular;  Laterality: Right;  Right Femoral endarterectomy with profundaplasty; right femoral-popliteal bypass with nonreversed saphenous vein; and right first toe amputation  . CATARACT EXTRACTION W/ INTRAOCULAR LENS  IMPLANT, BILATERAL  2002  . COLONOSCOPY    . CORONARY ANGIOPLASTY WITH STENT PLACEMENT  01/19/12   "1; first one I've ever had"  . EYE SURGERY  2009   Laser bilateral   . FEMORAL ENDARTERECTOMY  03/23/2012   right  . FEMORAL-POPLITEAL BYPASS GRAFT  03/23/2012   Procedure: BYPASS GRAFT FEMORAL-POPLITEAL ARTERY;  Surgeon: Elam Dutch, MD;  Location: [facility];  Service: Vascular;  Laterality: Right;  Right Femoral endarterectomy with profundaplasty; right femoral-popliteal bypass with nonreversed saphenous vein; and right first toe amputation  . HIP FRACTURE SURGERY Right 11/2017  . PERCUTANEOUS CORONARY STENT INTERVENTION (PCI-S) N/A 01/19/2012   Procedure: PERCUTANEOUS  CORONARY STENT INTERVENTION (PCI-S);  Surgeon: Peter M Martinique, MD;  Location: [facility] CATH LAB;  Service: Cardiovascular;  Laterality: N/A;  . PERIPHERAL ARTERIAL STENT GRAFT  2003   LLE  . POSTERIOR LAMINECTOMY / DECOMPRESSION LUMBAR SPINE  1989  . REFRACTIVE SURGERY  2009   bilaterally  . TONSILLECTOMY AND ADENOIDECTOMY  1946   Family History  Problem Relation Age of Onset  . Stroke Father 85  . Hypertension Father   . Stroke Mother 9  . Diabetes Mother   . Hypertension Mother   . Diabetes Sister   . Hyperlipidemia Sister   . Hypertension Sister   . Diabetes Brother   . Hypertension Brother    Social History:  reports that she quit smoking about 30 years ago. Her smoking use included cigarettes. She started smoking about 54 years ago. She has a 24.00 pack-year smoking history. She has never used smokeless tobacco. She reports that she does not drink alcohol or use drugs. Allergies:  Allergies  Allergen Reactions  . Ibuprofen Other (See Comments)    Dr. Britta Mccreedy informed patient not to take this due to ulcer    Medications Prior to Admission  Medication Sig Dispense Refill  . acetaminophen (TYLENOL) 650 MG CR tablet Take 1,300 mg by mouth every 8 (eight) hours as needed for pain.    Marland Kitchen amLODipine (NORVASC) 10 MG tablet TAKE ONE TABLET BY MOUTH DAILY. (Patient taking differently: Take 10 mg by mouth daily. ) 90 tablet 1  . aspirin 81 MG chewable tablet Chew 81 mg by mouth daily.     Marland Kitchen atorvastatin (LIPITOR) 80 MG tablet Take 80 mg by mouth daily.    . calcium carbonate (TUMS - DOSED IN MG ELEMENTAL CALCIUM) 500 MG chewable tablet Chew 1 tablet by mouth daily.    . cholecalciferol (VITAMIN D) 1000 UNITS tablet Take 1,000 Units by mouth daily.    . clopidogrel (PLAVIX) 75 MG tablet TAKE ONE TABLET BY MOUTH DAILY. (Patient taking differently: Take 75 mg by mouth daily. ) 90 tablet 1  . furosemide (LASIX) 20 MG tablet Take 20 mg by mouth daily.  0  . glipiZIDE (GLUCOTROL) 5 MG tablet Take  5 mg by mouth 2 (two) times daily before a meal.    . metoprolol tartrate (LOPRESSOR) 25 MG tablet Take 1 tablet (25 mg total) by mouth 2 (two) times daily. 180 tablet 3  . traMADol (ULTRAM) 50 MG tablet Take 50 mg by mouth 2 (two) times daily as needed for moderate pain.     . iron polysaccharides (NIFEREX) 150 MG capsule Take 150 mg by mouth daily.     Marland Kitchen lisinopril (PRINIVIL,ZESTRIL) 40 MG tablet Take 1 tablet (40 mg total) by mouth daily. 30 tablet 6  . magnesium oxide (MAG-OX) 400 MG tablet Take 400 mg by mouth daily.    . metFORMIN (GLUCOPHAGE) 1000 MG tablet Take 1,000 mg by mouth 2 (two) times daily with a meal.     . nitroGLYCERIN (NITROSTAT) 0.4 MG SL tablet Place 0.4 mg under the tongue every 5 (five) minutes as needed for chest pain.    Vladimir Faster Glycol-Propyl Glycol (SYSTANE) 0.4-0.3 % SOLN Place 1 drop into both eyes daily as needed (for dry eyes).    . silver sulfADIAZINE (SILVADENE)  1 % cream Apply 1 application topically daily.    . vitamin C (ASCORBIC ACID) 500 MG tablet Take 500 mg by mouth daily.       Home: Home Living Family/patient expects to be discharged to:: Private residence Living Arrangements: Alone  Functional History:   Functional Status:  Mobility:          ADL:    Cognition: Cognition Orientation Level: Oriented to person, Oriented to place, Oriented to situation    Blood pressure (!) 124/53, pulse 80, temperature 98 F (36.7 C), temperature source Oral, resp. rate 18, height 5' (1.524 m), weight 57.6 kg, SpO2 98 %. Physical Exam  Vitals reviewed. Constitutional: She is oriented to person, place, and time. She appears well-developed and well-nourished.  HENT:  Head: Normocephalic and atraumatic.  Eyes: EOM are normal. Right eye exhibits no discharge. Left eye exhibits no discharge.  Neck: Normal range of motion. Neck supple. No thyromegaly present.  Cardiovascular: Normal rate and regular rhythm.  Respiratory: Effort normal and breath  sounds normal. No respiratory distress.  GI: Soft. Bowel sounds are normal. She exhibits no distension.  Musculoskeletal:  LE edema RLE 1st digit amputation  Neurological: She is alert and oriented to person, place, and time.  Motor: RUE: 5/5 proximal to distal LUE: Shoulder abduction 2+/5, distally 4+/5 LLE: HF, KE 3+/5, ADF 3+/5 RLE: HF, KE 3/5, ADF 2+/5  Skin: Skin is warm and dry.  Bypass sites are dressed  Psychiatric: She has a normal mood and affect. Her behavior is normal.    Results for orders placed or performed during the hospital encounter of 04/30/18 (from the past 24 hour(s))  Glucose, capillary     Status: Abnormal   Collection Time: 05/03/18  9:30 AM  Result Value Ref Range   Glucose-Capillary 129 (H) 70 - 99 mg/dL  Prepare RBC     Status: None   Collection Time: 05/03/18  5:58 PM  Result Value Ref Range   Order Confirmation      ORDER PROCESSED BY BLOOD BANK Performed at [facility], [place] [address] [place], [place] [address]   Prepare fresh frozen plasma     Status: None (Preliminary result)   Collection Time: 05/03/18  6:09 PM  Result Value Ref Range   Unit Number [id number]    Blood Component Type THW PLS APHR    Unit division 00    Status of Unit ISSUED    Transfusion Status OK TO TRANSFUSE    Unit Number [id number]    Blood Component Type THW PLS APHR    Unit division B0    Status of Unit ISSUED    Transfusion Status      OK TO TRANSFUSE Performed at [facility], [place] [address] [place], [place] [address]   Glucose, capillary     Status: Abnormal   Collection Time: 05/03/18  7:28 PM  Result Value Ref Range   Glucose-Capillary 240 (H) 70 - 99 mg/dL  Glucose, capillary     Status: Abnormal   Collection Time: 05/03/18  9:03 PM  Result Value Ref Range   Glucose-Capillary 284 (H) 70 - 99 mg/dL  Basic metabolic panel     Status: Abnormal   Collection Time: 05/04/18  3:42 AM  Result Value Ref Range   Sodium 136 135 - 145 mmol/L     Potassium 5.0 3.5 - 5.1 mmol/L   Chloride 103 98 - 111 mmol/L   CO2 24 22 - 32 mmol/L  Glucose, Bld 252 (H) 70 - 99 mg/dL   BUN 17 8 - 23 mg/dL   Creatinine, Ser 1.16 (H) 0.44 - 1.00 mg/dL   Calcium 7.6 (L) 8.9 - 10.3 mg/dL   GFR calc non Af Amer 40 (L) >60 mL/min   GFR calc Af Amer 47 (L) >60 mL/min   Anion gap 9 5 - 15  CBC with Differential/Platelet     Status: Abnormal   Collection Time: 05/04/18  5:13 AM  Result Value Ref Range   WBC 12.2 (H) 4.0 - 10.5 K/uL   RBC 3.42 (L) 3.87 - 5.11 MIL/uL   Hemoglobin 10.3 (L) 12.0 - 15.0 g/dL   HCT 29.8 (L) 36.0 - 46.0 %   MCV 87.1 80.0 - 100.0 fL   MCH 30.1 26.0 - 34.0 pg   MCHC 34.6 30.0 - 36.0 g/dL   RDW 14.6 11.5 - 15.5 %   Platelets 124 (L) 150 - 400 K/uL   Neutrophils Relative % 79 %   Neutro Abs 9.7 (H) 1.7 - 7.7 K/uL   Lymphocytes Relative 9 %   Lymphs Abs 1.1 0.7 - 4.0 K/uL   Monocytes Relative 11 %   Monocytes Absolute 1.3 (H) 0.1 - 1.0 K/uL   Eosinophils Relative 0 %   Eosinophils Absolute 0.0 0.0 - 0.5 K/uL   Basophils Relative 0 %   Basophils Absolute 0.0 0.0 - 0.1 K/uL   Immature Granulocytes 1 %   Abs Immature Granulocytes 0.1 (H) 0.00 - 0.07 K/uL  Glucose, capillary     Status: Abnormal   Collection Time: 05/04/18  6:23 AM  Result Value Ref Range   Glucose-Capillary 228 (H) 70 - 99 mg/dL   No results found.  Assessment/Plan: Diagnosis: Debility Labs independently reviewed.  Records reviewed and summated above.  1. Does the need for close, 24 hr/day medical supervision in concert with the patient's rehab needs make it unreasonable for this patient to be served in a less intensive setting? Potentially  2. Co-Morbidities requiring supervision/potential complications: diastolic congestive heart (monitor for signs/symptoms of fluid overload), CAD with stenting, failure (cont meds), COPD (monitor RR and O2 sats with increased mobility), type 2 DM (Monitor in accordance with exercise and adjust meds as necessary),  right hip fracture, PVD with left SFA, leukocytosis (cont to monitor for signs and symptoms of infection, further workup if indicated) 3. Due to bladder management, bowel management, safety, skin/wound care, disease management, pain management and patient education, does the patient require 24 hr/day rehab nursing? Potentially 4. Does the patient require coordinated care of a physician, rehab nurse, PT (1-2 hrs/day, 5 days/week) and OT (1-2 hrs/day, 5 days/week) to address physical and functional deficits in the context of the above medical diagnosis(es)? Potentially Addressing deficits in the following areas: balance, endurance, locomotion, strength, transferring, bathing, dressing, toileting and psychosocial support 5. Can the patient actively participate in an intensive therapy program of at least 3 hrs of therapy per day at least 5 days per week? Potentially 6. The potential for patient to make measurable gains while on inpatient rehab is TBD 7. Anticipated functional outcomes upon discharge from inpatient rehab are TBD  with PT, TBD with OT, n/a with SLP. 8. Estimated rehab length of stay to reach the above functional goals is: TBD. 9. Anticipated D/C setting: Home 10. Anticipated post D/C treatments: HH therapy and Home excercise program 11. Overall Rehab/Functional Prognosis: excellent and good  RECOMMENDATIONS: This patient's condition is appropriate for continued rehabilitative care in the following  setting: Will need to await completion of therapy evals, however, of note, pt prefers to go [facility] with [facility]. Patient has agreed to participate in recommended program. Potentially Note that insurance prior authorization may be required for reimbursement for recommended care.  Comment: Rehab Admissions Coordinator to follow up.   I have personally performed a face to face diagnostic evaluation, including, but not limited to relevant history and physical exam findings, of this patient and developed  relevant assessment and plan.  Additionally, I have reviewed and concur with the physician assistant's documentation above.   Delice Lesch, MD, ABPMR Lavon Paganini Angiulli, PA-C 05/04/2018

## 2018-05-04 NOTE — Social Work (Signed)
CSW acknowledging consult for disposition support.   Appears recommendation is for HHPT vs. CIR. CSW signing off. Please consult if any additional needs arise.  Alexander Mt, [place] [employer] [phone]

## 2018-05-04 NOTE — Progress Notes (Signed)
ABI completed: Noncompressible bilaterally. Patient had trouble tolerating compressions due to pain. Right pedal waveforms monophasic, Left pedal waveforms dampened monophasic.  Landry Mellow, RDMS, RVT

## 2018-05-05 ENCOUNTER — Inpatient Hospital Stay (HOSPITAL_COMMUNITY)
Admission: RE | Admit: 2018-05-05 | Discharge: 2018-05-18 | DRG: 945 | Disposition: A | Discharge status: Home Health | Payer: Medicare Other | Source: Intra-hospital | Attending: Physical Medicine & Rehabilitation | Admitting: Physical Medicine & Rehabilitation

## 2018-05-05 ENCOUNTER — Other Ambulatory Visit: Payer: Self-pay

## 2018-05-05 ENCOUNTER — Encounter (HOSPITAL_COMMUNITY): Payer: Self-pay

## 2018-05-05 DIAGNOSIS — K219 Gastro-esophageal reflux disease without esophagitis: Secondary | ICD-10-CM | POA: Diagnosis present

## 2018-05-05 DIAGNOSIS — D62 Acute posthemorrhagic anemia: Secondary | ICD-10-CM | POA: Diagnosis present

## 2018-05-05 DIAGNOSIS — J449 Chronic obstructive pulmonary disease, unspecified: Secondary | ICD-10-CM | POA: Diagnosis present

## 2018-05-05 DIAGNOSIS — Z8249 Family history of ischemic heart disease and other diseases of the circulatory system: Secondary | ICD-10-CM | POA: Diagnosis not present

## 2018-05-05 DIAGNOSIS — Z79899 Other long term (current) drug therapy: Secondary | ICD-10-CM

## 2018-05-05 DIAGNOSIS — N183 Chronic kidney disease, stage 3 unspecified: Secondary | ICD-10-CM

## 2018-05-05 DIAGNOSIS — Z7984 Long term (current) use of oral hypoglycemic drugs: Secondary | ICD-10-CM

## 2018-05-05 DIAGNOSIS — N189 Chronic kidney disease, unspecified: Secondary | ICD-10-CM | POA: Diagnosis present

## 2018-05-05 DIAGNOSIS — Z833 Family history of diabetes mellitus: Secondary | ICD-10-CM | POA: Diagnosis not present

## 2018-05-05 DIAGNOSIS — I10 Essential (primary) hypertension: Secondary | ICD-10-CM

## 2018-05-05 DIAGNOSIS — E1142 Type 2 diabetes mellitus with diabetic polyneuropathy: Secondary | ICD-10-CM | POA: Diagnosis present

## 2018-05-05 DIAGNOSIS — E119 Type 2 diabetes mellitus without complications: Secondary | ICD-10-CM | POA: Diagnosis not present

## 2018-05-05 DIAGNOSIS — E1122 Type 2 diabetes mellitus with diabetic chronic kidney disease: Secondary | ICD-10-CM | POA: Diagnosis present

## 2018-05-05 DIAGNOSIS — I2510 Atherosclerotic heart disease of native coronary artery without angina pectoris: Secondary | ICD-10-CM | POA: Diagnosis not present

## 2018-05-05 DIAGNOSIS — R5381 Other malaise: Secondary | ICD-10-CM | POA: Diagnosis not present

## 2018-05-05 DIAGNOSIS — D72829 Elevated white blood cell count, unspecified: Secondary | ICD-10-CM | POA: Diagnosis present

## 2018-05-05 DIAGNOSIS — E785 Hyperlipidemia, unspecified: Secondary | ICD-10-CM

## 2018-05-05 DIAGNOSIS — Z7982 Long term (current) use of aspirin: Secondary | ICD-10-CM

## 2018-05-05 DIAGNOSIS — I5042 Chronic combined systolic (congestive) and diastolic (congestive) heart failure: Secondary | ICD-10-CM

## 2018-05-05 DIAGNOSIS — I739 Peripheral vascular disease, unspecified: Secondary | ICD-10-CM | POA: Diagnosis not present

## 2018-05-05 DIAGNOSIS — D649 Anemia, unspecified: Secondary | ICD-10-CM

## 2018-05-05 DIAGNOSIS — I5032 Chronic diastolic (congestive) heart failure: Secondary | ICD-10-CM | POA: Diagnosis present

## 2018-05-05 DIAGNOSIS — Z7902 Long term (current) use of antithrombotics/antiplatelets: Secondary | ICD-10-CM

## 2018-05-05 DIAGNOSIS — Z89411 Acquired absence of right great toe: Secondary | ICD-10-CM

## 2018-05-05 DIAGNOSIS — E1151 Type 2 diabetes mellitus with diabetic peripheral angiopathy without gangrene: Secondary | ICD-10-CM | POA: Diagnosis present

## 2018-05-05 DIAGNOSIS — I130 Hypertensive heart and chronic kidney disease with heart failure and stage 1 through stage 4 chronic kidney disease, or unspecified chronic kidney disease: Secondary | ICD-10-CM | POA: Diagnosis present

## 2018-05-05 DIAGNOSIS — Z955 Presence of coronary angioplasty implant and graft: Secondary | ICD-10-CM

## 2018-05-05 DIAGNOSIS — Z87891 Personal history of nicotine dependence: Secondary | ICD-10-CM | POA: Diagnosis not present

## 2018-05-05 LAB — CBC
HCT: 35.8 % — ABNORMAL LOW (ref 36.0–46.0)
Hemoglobin: 12.1 g/dL (ref 12.0–15.0)
MCH: 30.1 pg (ref 26.0–34.0)
MCHC: 33.8 g/dL (ref 30.0–36.0)
MCV: 89.1 fL (ref 80.0–100.0)
Platelets: 145 10*3/uL — ABNORMAL LOW (ref 150–400)
RBC: 4.02 MIL/uL (ref 3.87–5.11)
RDW: 15.6 % — ABNORMAL HIGH (ref 11.5–15.5)
WBC: 18.2 10*3/uL — ABNORMAL HIGH (ref 4.0–10.5)
nRBC: 0 % (ref 0.0–0.2)

## 2018-05-05 LAB — COMPREHENSIVE METABOLIC PANEL
ALT: 7 U/L (ref 0–44)
AST: 19 U/L (ref 15–41)
Albumin: 2.5 g/dL — ABNORMAL LOW (ref 3.5–5.0)
Alkaline Phosphatase: 64 U/L (ref 38–126)
Anion gap: 9 (ref 5–15)
BUN: 22 mg/dL (ref 8–23)
CO2: 23 mmol/L (ref 22–32)
Calcium: 7.8 mg/dL — ABNORMAL LOW (ref 8.9–10.3)
Chloride: 99 mmol/L (ref 98–111)
Creatinine, Ser: 1.29 mg/dL — ABNORMAL HIGH (ref 0.44–1.00)
GFR calc Af Amer: 41 mL/min — ABNORMAL LOW (ref >60)
GFR calc non Af Amer: 36 mL/min — ABNORMAL LOW (ref >60)
Glucose, Bld: 313 mg/dL — ABNORMAL HIGH (ref 70–99)
Potassium: 4.4 mmol/L (ref 3.5–5.1)
Sodium: 131 mmol/L — ABNORMAL LOW (ref 135–145)
Total Bilirubin: 0.8 mg/dL (ref 0.3–1.2)
Total Protein: 4.8 g/dL — ABNORMAL LOW (ref 6.5–8.1)

## 2018-05-05 LAB — BASIC METABOLIC PANEL
Anion gap: 6 (ref 5–15)
BUN: 19 mg/dL (ref 8–23)
CO2: 25 mmol/L (ref 22–32)
Calcium: 7.6 mg/dL — ABNORMAL LOW (ref 8.9–10.3)
Chloride: 103 mmol/L (ref 98–111)
Creatinine, Ser: 1.24 mg/dL — ABNORMAL HIGH (ref 0.44–1.00)
GFR calc Af Amer: 43 mL/min — ABNORMAL LOW (ref >60)
GFR calc non Af Amer: 37 mL/min — ABNORMAL LOW (ref >60)
Glucose, Bld: 203 mg/dL — ABNORMAL HIGH (ref 70–99)
Potassium: 4.3 mmol/L (ref 3.5–5.1)
Sodium: 134 mmol/L — ABNORMAL LOW (ref 135–145)

## 2018-05-05 LAB — CBC WITH DIFFERENTIAL/PLATELET
Abs Immature Granulocytes: 0.04 10*3/uL (ref 0.00–0.07)
Basophils Absolute: 0 10*3/uL (ref 0.0–0.1)
Basophils Relative: 0 %
Eosinophils Absolute: 0 10*3/uL (ref 0.0–0.5)
Eosinophils Relative: 0 %
HCT: 26.2 % — ABNORMAL LOW (ref 36.0–46.0)
Hemoglobin: 8.5 g/dL — ABNORMAL LOW (ref 12.0–15.0)
Immature Granulocytes: 0 %
Lymphocytes Relative: 14 %
Lymphs Abs: 1.7 10*3/uL (ref 0.7–4.0)
MCH: 28.7 pg (ref 26.0–34.0)
MCHC: 32.4 g/dL (ref 30.0–36.0)
MCV: 88.5 fL (ref 80.0–100.0)
Monocytes Absolute: 1.5 10*3/uL — ABNORMAL HIGH (ref 0.1–1.0)
Monocytes Relative: 13 %
Neutro Abs: 8.9 10*3/uL — ABNORMAL HIGH (ref 1.7–7.7)
Neutrophils Relative %: 73 %
Platelets: 126 10*3/uL — ABNORMAL LOW (ref 150–400)
RBC: 2.96 MIL/uL — ABNORMAL LOW (ref 3.87–5.11)
RDW: 15.7 % — ABNORMAL HIGH (ref 11.5–15.5)
WBC: 12.2 10*3/uL — ABNORMAL HIGH (ref 4.0–10.5)
nRBC: 0 % (ref 0.0–0.2)

## 2018-05-05 LAB — PREPARE RBC (CROSSMATCH)

## 2018-05-05 LAB — GLUCOSE, CAPILLARY
Glucose-Capillary: 185 mg/dL — ABNORMAL HIGH (ref 70–99)
Glucose-Capillary: 190 mg/dL — ABNORMAL HIGH (ref 70–99)
Glucose-Capillary: 167 mg/dL — ABNORMAL HIGH (ref 70–99)
Glucose-Capillary: 296 mg/dL — ABNORMAL HIGH (ref 70–99)

## 2018-05-05 MED ORDER — DOCUSATE SODIUM 100 MG PO CAPS
100.0000 mg | ORAL_CAPSULE | Freq: Every day | ORAL | Status: DC
  Administered 2018-05-06 – 2018-05-18 (×13): 100 mg via ORAL
  Filled 2018-05-05 (×14): qty 1

## 2018-05-05 MED ORDER — OXYCODONE HCL 5 MG PO TABS
5.0000 mg | ORAL_TABLET | ORAL | Status: DC | PRN
  Administered 2018-05-08: 10 mg via ORAL
  Administered 2018-05-09: 5 mg via ORAL
  Administered 2018-05-15: 10 mg via ORAL
  Administered 2018-05-16: 5 mg via ORAL
  Administered 2018-05-18: 10 mg via ORAL
  Filled 2018-05-05: qty 1
  Filled 2018-05-05: qty 2
  Filled 2018-05-05: qty 1
  Filled 2018-05-05 (×2): qty 2

## 2018-05-05 MED ORDER — ASPIRIN 81 MG PO CHEW
81.0000 mg | CHEWABLE_TABLET | Freq: Every day | ORAL | Status: DC
  Administered 2018-05-06 – 2018-05-18 (×13): 81 mg via ORAL
  Filled 2018-05-05 (×14): qty 1

## 2018-05-05 MED ORDER — GUAIFENESIN-DM 100-10 MG/5ML PO SYRP: 15.0000 mL | ORAL_SOLUTION | ORAL | Status: DC | PRN

## 2018-05-05 MED ORDER — ACETAMINOPHEN 325 MG PO TABS
325.0000 mg | ORAL_TABLET | ORAL | Status: DC | PRN
  Administered 2018-05-05: 650 mg via ORAL
  Administered 2018-05-08: 325 mg via ORAL
  Administered 2018-05-09 – 2018-05-13 (×4): 650 mg via ORAL
  Filled 2018-05-05 (×3): qty 2
  Filled 2018-05-05: qty 1
  Filled 2018-05-05 (×3): qty 2

## 2018-05-05 MED ORDER — PANTOPRAZOLE SODIUM 40 MG PO TBEC
40.0000 mg | DELAYED_RELEASE_TABLET | Freq: Every day | ORAL | Status: DC
  Administered 2018-05-05 – 2018-05-17 (×13): 40 mg via ORAL
  Filled 2018-05-05 (×13): qty 1

## 2018-05-05 MED ORDER — FUROSEMIDE 20 MG PO TABS: 20.0000 mg | ORAL_TABLET | Freq: Every day | ORAL | Status: DC

## 2018-05-05 MED ORDER — POLYVINYL ALCOHOL 1.4 % OP SOLN: 1.0000 [drp] | Freq: Every day | OPHTHALMIC | Status: DC | PRN

## 2018-05-05 MED ORDER — POLYSACCHARIDE IRON COMPLEX 150 MG PO CAPS
150.0000 mg | ORAL_CAPSULE | Freq: Every day | ORAL | Status: DC
  Administered 2018-05-06 – 2018-05-18 (×13): 150 mg via ORAL
  Filled 2018-05-05 (×14): qty 1

## 2018-05-05 MED ORDER — LISINOPRIL 40 MG PO TABS
40.0000 mg | ORAL_TABLET | Freq: Every day | ORAL | Status: DC
  Administered 2018-05-06 – 2018-05-18 (×13): 40 mg via ORAL
  Filled 2018-05-05 (×7): qty 1
  Filled 2018-05-05: qty 2
  Filled 2018-05-05 (×6): qty 1

## 2018-05-05 MED ORDER — FUROSEMIDE 20 MG PO TABS: 20.0000 mg | ORAL_TABLET | Freq: Two times a day (BID) | ORAL | Status: DC

## 2018-05-05 MED ORDER — GLIPIZIDE 5 MG PO TABS
5.0000 mg | ORAL_TABLET | Freq: Two times a day (BID) | ORAL | Status: DC
  Administered 2018-05-06 – 2018-05-12 (×13): 5 mg via ORAL
  Filled 2018-05-05 (×13): qty 1

## 2018-05-05 MED ORDER — AMLODIPINE BESYLATE 10 MG PO TABS
10.0000 mg | ORAL_TABLET | Freq: Every day | ORAL | Status: DC
  Administered 2018-05-06 – 2018-05-18 (×13): 10 mg via ORAL
  Filled 2018-05-05 (×14): qty 1

## 2018-05-05 MED ORDER — INSULIN ASPART 100 UNIT/ML ~~LOC~~ SOLN
3.0000 [IU] | Freq: Three times a day (TID) | SUBCUTANEOUS | Status: DC
  Administered 2018-05-06 – 2018-05-18 (×37): 3 [IU] via SUBCUTANEOUS

## 2018-05-05 MED ORDER — CALCIUM CARBONATE ANTACID 500 MG PO CHEW
1.0000 | CHEWABLE_TABLET | Freq: Every day | ORAL | Status: DC
  Administered 2018-05-06 – 2018-05-18 (×13): 200 mg via ORAL
  Filled 2018-05-05 (×14): qty 1

## 2018-05-05 MED ORDER — SODIUM CHLORIDE 0.9% IV SOLUTION
Freq: Once | INTRAVENOUS | Status: AC
  Administered 2018-05-05: 11:00:00 via INTRAVENOUS

## 2018-05-05 MED ORDER — ATORVASTATIN CALCIUM 80 MG PO TABS
80.0000 mg | ORAL_TABLET | Freq: Every day | ORAL | Status: DC
  Administered 2018-05-06 – 2018-05-18 (×13): 80 mg via ORAL
  Filled 2018-05-05 (×14): qty 1

## 2018-05-05 MED ORDER — NITROGLYCERIN 0.4 MG SL SUBL: 0.4000 mg | SUBLINGUAL_TABLET | SUBLINGUAL | Status: DC | PRN

## 2018-05-05 MED ORDER — TRAMADOL HCL 50 MG PO TABS
50.0000 mg | ORAL_TABLET | Freq: Two times a day (BID) | ORAL | Status: DC | PRN
  Administered 2018-05-07 – 2018-05-17 (×2): 50 mg via ORAL
  Filled 2018-05-05 (×2): qty 1

## 2018-05-05 MED ORDER — FUROSEMIDE 20 MG PO TABS
20.0000 mg | ORAL_TABLET | Freq: Every day | ORAL | Status: DC
  Administered 2018-05-06 – 2018-05-18 (×13): 20 mg via ORAL
  Filled 2018-05-05 (×14): qty 1

## 2018-05-05 MED ORDER — VITAMIN D 1000 UNITS PO TABS
1000.0000 [IU] | ORAL_TABLET | Freq: Every day | ORAL | Status: DC
  Administered 2018-05-06 – 2018-05-18 (×13): 1000 [IU] via ORAL
  Filled 2018-05-05 (×14): qty 1

## 2018-05-05 MED ORDER — BISACODYL 5 MG PO TBEC
5.0000 mg | DELAYED_RELEASE_TABLET | Freq: Every day | ORAL | Status: DC | PRN
  Administered 2018-05-10 – 2018-05-14 (×2): 5 mg via ORAL
  Filled 2018-05-05 (×2): qty 1

## 2018-05-05 MED ORDER — ENOXAPARIN SODIUM 30 MG/0.3ML ~~LOC~~ SOLN
30.0000 mg | SUBCUTANEOUS | Status: DC
  Administered 2018-05-06 – 2018-05-18 (×13): 30 mg via SUBCUTANEOUS
  Filled 2018-05-05 (×14): qty 0.3

## 2018-05-05 MED ORDER — VITAMIN C 500 MG PO TABS
500.0000 mg | ORAL_TABLET | Freq: Every day | ORAL | Status: DC
  Administered 2018-05-06 – 2018-05-18 (×13): 500 mg via ORAL
  Filled 2018-05-05 (×14): qty 1

## 2018-05-05 MED ORDER — ENOXAPARIN SODIUM 30 MG/0.3ML ~~LOC~~ SOLN: 30.0000 mg | SUBCUTANEOUS | Status: DC

## 2018-05-05 MED ORDER — MAGNESIUM OXIDE 400 (241.3 MG) MG PO TABS
400.0000 mg | ORAL_TABLET | Freq: Every day | ORAL | Status: DC
  Administered 2018-05-06 – 2018-05-18 (×13): 400 mg via ORAL
  Filled 2018-05-05 (×14): qty 1

## 2018-05-05 MED ORDER — SORBITOL 70 % SOLN: 30.0000 mL | Freq: Every day | Status: DC | PRN

## 2018-05-05 MED ORDER — ACETAMINOPHEN 650 MG RE SUPP: 325.0000 mg | RECTAL | Status: DC | PRN

## 2018-05-05 MED ORDER — INSULIN ASPART 100 UNIT/ML ~~LOC~~ SOLN
0.0000 [IU] | Freq: Three times a day (TID) | SUBCUTANEOUS | Status: DC
  Administered 2018-05-06: 2 [IU] via SUBCUTANEOUS
  Administered 2018-05-06 – 2018-05-08 (×6): 3 [IU] via SUBCUTANEOUS
  Administered 2018-05-08: 2 [IU] via SUBCUTANEOUS
  Administered 2018-05-08 – 2018-05-09 (×2): 3 [IU] via SUBCUTANEOUS
  Administered 2018-05-09: 2 [IU] via SUBCUTANEOUS
  Administered 2018-05-09 – 2018-05-10 (×3): 3 [IU] via SUBCUTANEOUS
  Administered 2018-05-10: 2 [IU] via SUBCUTANEOUS
  Administered 2018-05-11 – 2018-05-12 (×4): 3 [IU] via SUBCUTANEOUS
  Administered 2018-05-12: 5 [IU] via SUBCUTANEOUS
  Administered 2018-05-13 (×2): 3 [IU] via SUBCUTANEOUS
  Administered 2018-05-13: 5 [IU] via SUBCUTANEOUS
  Administered 2018-05-14 (×3): 3 [IU] via SUBCUTANEOUS
  Administered 2018-05-15: 2 [IU] via SUBCUTANEOUS
  Administered 2018-05-15 (×2): 5 [IU] via SUBCUTANEOUS
  Administered 2018-05-16: 3 [IU] via SUBCUTANEOUS
  Administered 2018-05-16: 5 [IU] via SUBCUTANEOUS
  Administered 2018-05-16 – 2018-05-17 (×2): 3 [IU] via SUBCUTANEOUS
  Administered 2018-05-17: 2 [IU] via SUBCUTANEOUS
  Administered 2018-05-17 – 2018-05-18 (×2): 3 [IU] via SUBCUTANEOUS

## 2018-05-05 MED ORDER — METOPROLOL TARTRATE 25 MG PO TABS
25.0000 mg | ORAL_TABLET | Freq: Two times a day (BID) | ORAL | Status: DC
  Administered 2018-05-05 – 2018-05-18 (×26): 25 mg via ORAL
  Filled 2018-05-05 (×27): qty 1

## 2018-05-05 NOTE — Progress Notes (Signed)
Inpatient Rehabilitation-Admissions Coordinator   Met with pt and her family to discuss CIR. After discussion, pt and family have decided to pursue CIR for rehab at this time. AC has received medical clearance for admit to CIR today.   Please call if questions.   Jhonnie Garner, OTR/L  Rehab Admissions Coordinator  [phone] 05/05/2018 2:14 PM

## 2018-05-05 NOTE — Progress Notes (Deleted)
PMR Admission Coordinator Pre-Admission Assessment  Patient: Cynthia Ray is an 84 y.o., female MRN: [id number] DOB: September 22, 1925 Height: 5' (152.4 cm) Weight: 57.6 kg                                                                                                                                                  Insurance Information HMO:     PPO:      PCP:      IPA:      80/20: Yes     OTHER:  PRIMARY: Medicare Part A and B      Policy#: [id number]      Subscriber: Patient CM Name:       Phone#:      Fax#:  Pre-Cert#:       Employer:  Benefits:  Phone #: NA     Name: Verified eligibility online via [employer] on 05/04/18 Eff. Date: part A effective: 05/28/93; Part B effective: 08/29/95     Deduct: $1,364      Out of Pocket Max: NA      Life Max: NA CIR: Covered per Medicare guidelines once yearly deductible is met      SNF: days 1-20, 100%; days 21-100, 80%  Outpatient: 80%     Co-Pay: 20% Home Health: 100%      Co-Pay:  DME: 80%     Co-Pay: 20% Providers: Pt's choice SECONDARY: Generic Insurance       Policy#: [id number]      Subscriber: Patient CM Name:       Phone#:      Fax#:  Pre-Cert#:      Employer:  Benefits:  Phone #: [phone]     Name:  Eff. Date:      Deduct:       Out of Pocket Max:       Life Max:  CIR:       SNF:  Outpatient:      Co-Pay:  Home Health:       Co-Pay:  DME:      Co-Pay:   Medicaid Application Date:       Case Manager:  Disability Application Date:       Case Worker:   Emergency Contact Information         Contact Information    Name Relation Home Work Mobile   Fain,Toni Daughter [phone]       Current Medical History  Patient Admitting Diagnosis: Debility History of Present Illness: Cynthia Ray an 84 year old right-handed female with history of diastolic congestive heart failure, CAD with stenting, COPD, type 2 diabetes mellitus, right hip fracture May 2019 receiving inpatient rehab services at a skilled nursing facility,  PVD and left SFA stenting maintained on Plavix and aspirin as well as 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 Per chart review patient lives  alone in [facility]. Used a walker prior to admission and was receiving home health therapies. Daughter across the street assists as needed. One level home. Presented 04/30/2018 with nonhealing ulcers of the left foot. Recent aortogram showed subtotal occlusion left distal femoral artery as well as occlusion left superficial femoral-popliteal artery. Underwent endarterectomy of left external iliac common femoral superficial femoral artery with profundoplasty left femoral to anterior tibial artery bypass 05/03/2018 per Dr. Oneida Alar. Hospital course leukocytosis 12,200, pain management.Acute on chronic renal insufficiency creatinine baseline 1.25. Subcutaneous Lovenox for DVT prophylaxis. Acute blood loss anemia 5.4 transfused with follow-up hemoglobin 10.9 dropping to 8.5 transfused 2 units 05/05/2018.Marland Kitchen Physical (per discussion with PT, notes pending) and occupational therapy evaluation completed 05/04/2018 with recommendations of physical medicine rehab consult. Patient is to be admitted for a comprehensive rehab program on 05/05/18.   Past Medical History      Past Medical History:  Diagnosis Date  . Anemia   . Arthritis    "dr said I have it in my back" Lower   . Carotid artery occlusion   . CHF (congestive heart failure) ([place]) 12/2017  . COPD (chronic obstructive pulmonary disease) ([place])   . Fractured hip ([place])    Fell and hurt R hip  . GERD (gastroesophageal reflux disease)   . History of blood transfusion 11/11/11  . Hyperlipidemia   . Hypertension   . PVD (peripheral vascular disease) ([place])    Stents left SFA 2004 Dr. Albertine Patricia  . Type II diabetes mellitus ([place])     Family History  family history includes Diabetes in her brother, mother, and sister; Hyperlipidemia in her sister; Hypertension in her brother,  father, mother, and sister; Stroke (age of onset: 44) in her father; Stroke (age of onset: 19) in her mother.  Prior Rehab/Hospitalizations:  Has the patient had major surgery during 100 days prior to admission? No  Current Medications   Current Facility-Administered Medications:  .  0.9 %  sodium chloride infusion, 500 mL, Intravenous, Once PRN, Theda Sers, Emma M, PA-C .  0.9 %  sodium chloride infusion, , Intravenous, Continuous, Ulyses Amor, [place], Stopped at 05/04/18 0923 .  acetaminophen (TYLENOL) tablet 325-650 mg, 325-650 mg, Oral, Q4H PRN **OR** acetaminophen (TYLENOL) suppository 325-650 mg, 325-650 mg, Rectal, Q4H PRN, Theda Sers, Emma M, PA-C .  alum & mag hydroxide-simeth (MAALOX/MYLANTA) 200-200-20 MG/5ML suspension 15-30 mL, 15-30 mL, Oral, Q2H PRN, Theda Sers, Emma M, PA-C .  amLODipine (NORVASC) tablet 10 mg, 10 mg, Oral, Daily, Laurence Slate M, PA-C, 10 mg at 05/05/18 0943 .  aspirin chewable tablet 81 mg, 81 mg, Oral, Daily, Laurence Slate M, PA-C, 81 mg at 05/05/18 0944 .  atorvastatin (LIPITOR) tablet 80 mg, 80 mg, Oral, Daily, Laurence Slate M, PA-C, 80 mg at 05/05/18 0941 .  bisacodyl (DULCOLAX) EC tablet 5 mg, 5 mg, Oral, Daily PRN, Laurence Slate M, PA-C .  calcium carbonate (TUMS - dosed in mg elemental calcium) chewable tablet 200 mg of elemental calcium, 1 tablet, Oral, Q breakfast, Collins, Emma M, PA-C, 200 mg of elemental calcium at 05/05/18 0941 .  cholecalciferol (VITAMIN D) tablet 1,000 Units, 1,000 Units, Oral, Daily, Ulyses Amor, PA-C, 1,000 Units at 05/05/18 0941 .  docusate sodium (COLACE) capsule 100 mg, 100 mg, Oral, Daily, Laurence Slate M, PA-C, 100 mg at 05/05/18 0944 .  enoxaparin (LOVENOX) injection 30 mg, 30 mg, Subcutaneous, Q24H, Collins, Emma M, PA-C, 30 mg at 05/05/18 0940 .  [START ON 05/06/2018] furosemide (LASIX) tablet 20  mg, 20 mg, Oral, Daily, Purohit, Shrey C, MD .  glipiZIDE (GLUCOTROL) tablet 5 mg, 5 mg, Oral, BID AC, Collins, Emma M, PA-C,  5 mg at 05/05/18 0940 .  guaiFENesin-dextromethorphan (ROBITUSSIN DM) 100-10 MG/5ML syrup 15 mL, 15 mL, Oral, Q4H PRN, Theda Sers, Emma M, PA-C .  hydrALAZINE (APRESOLINE) injection 5 mg, 5 mg, Intravenous, Q20 Min PRN, Collins, Emma M, PA-C .  insulin aspart (novoLOG) injection 0-15 Units, 0-15 Units, Subcutaneous, TID WC, Collins, Emma M, PA-C, 3 Units at 05/05/18 1208 .  insulin aspart (novoLOG) injection 3 Units, 3 Units, Subcutaneous, TID WC, Alma Friendly, MD, 3 Units at 05/05/18 [phone] .  iron polysaccharides (NIFEREX) capsule 150 mg, 150 mg, Oral, Daily, Laurence Slate M, PA-C, 150 mg at 05/05/18 0941 .  labetalol (NORMODYNE,TRANDATE) injection 10 mg, 10 mg, Intravenous, Q10 min PRN, Theda Sers, Emma M, PA-C .  lactated ringers infusion, , Intravenous, Continuous, Ulyses Amor, PA-C, Last Rate: 10 mL/hr at 05/03/18 1002 .  lisinopril (PRINIVIL,ZESTRIL) tablet 40 mg, 40 mg, Oral, Daily, Laurence Slate M, PA-C, 40 mg at 05/05/18 0944 .  magnesium oxide (MAG-OX) tablet 400 mg, 400 mg, Oral, Daily, Laurence Slate M, PA-C, 400 mg at 05/05/18 0941 .  magnesium sulfate IVPB 2 g 50 mL, 2 g, Intravenous, Daily PRN, Laurence Slate M, PA-C .  metoprolol tartrate (LOPRESSOR) injection 2-5 mg, 2-5 mg, Intravenous, Q2H PRN, Laurence Slate M, PA-C .  metoprolol tartrate (LOPRESSOR) tablet 25 mg, 25 mg, Oral, BID, Laurence Slate M, PA-C, 25 mg at 05/05/18 0943 .  morphine 2 MG/ML injection 0.5-1 mg, 0.5-1 mg, Intravenous, Q2H PRN, Theda Sers, Emma M, PA-C .  nitroGLYCERIN (NITROSTAT) SL tablet 0.4 mg, 0.4 mg, Sublingual, Q5 min PRN, Theda Sers, Emma M, PA-C .  ondansetron [facility]) injection 4 mg, 4 mg, Intravenous, Q6H PRN, Laurence Slate M, PA-C .  oxyCODONE (Oxy IR/ROXICODONE) immediate release tablet 5-10 mg, 5-10 mg, Oral, Q4H PRN, Laurence Slate M, PA-C, 5 mg at 05/05/18 0535 .  pantoprazole (PROTONIX) EC tablet 40 mg, 40 mg, Oral, Q2200, Ulyses Amor, PA-C, 40 mg at 05/04/18 2220 .  phenol (CHLORASEPTIC)  mouth spray 1 spray, 1 spray, Mouth/Throat, PRN, Theda Sers, Emma M, PA-C .  polyvinyl alcohol (LIQUIFILM TEARS) 1.4 % ophthalmic solution 1 drop, 1 drop, Both Eyes, Daily PRN, Theda Sers, Emma M, PA-C .  potassium chloride SA (K-DUR,KLOR-CON) CR tablet 20-40 mEq, 20-40 mEq, Oral, Daily PRN, Theda Sers, Emma M, PA-C .  senna-docusate (Senokot-S) tablet 1 tablet, 1 tablet, Oral, QHS PRN, Laurence Slate M, PA-C .  silver sulfADIAZINE (SILVADENE) 1 % cream 1 application, 1 application, Topical, Daily, Ulyses Amor, PA-C, 1 application at 96/04/54 [phone] .  traMADol (ULTRAM) tablet 50 mg, 50 mg, Oral, BID PRN, Ulyses Amor, PA-C, 50 mg at 05/05/18 0132 .  vitamin C (ASCORBIC ACID) tablet 500 mg, 500 mg, Oral, Daily, Laurence Slate M, PA-C, 500 mg at 05/05/18 1914  Patients Current Diet:     Diet Order                  Diet - low sodium heart healthy         Diet heart healthy/carb modified Room service appropriate? Yes; Fluid consistency: Thin  Diet effective now               Precautions / Restrictions Precautions Precautions: Fall Precaution Comments: non-healing bilat foot wounds Other Brace/Splint: post op shoes bilaterally Restrictions Weight Bearing Restrictions: No   Has the patient had  2 or more falls or a fall with injury in the past year?Yes  Prior Activity Level Household: Modified [place] PTA with some assistance for bathing LB and meal prep  Home Assistive Devices / Equipment Home Assistive Devices/Equipment: Eyeglasses, Dentures (specify type), Walker (specify type), Wheelchair, CBG Meter, Other (Comment), Bedside commode/3-in-1, Grab bars in shower, Hand-held shower hose("been sleeping in my recliner") Home Equipment: Walker - 2 wheels, Shower seat, Grab bars - toilet  Prior Device Use: Indicate devices/aids used by the patient prior to current illness, exacerbation or injury? Walker  Prior Functional Level Prior Function Level of Independence:  Needs assistance Gait / Transfers Assistance Needed: uses RW ADL's / Homemaking Assistance Needed: Pt reports daughter assists with donning socks and shoes   Self Care: Did the patient need help bathing, dressing, using the toilet or eating?  Independent for dressing, toileting, and eating; Mod A for bathing per report.   Indoor Mobility: Did the patient need assistance with walking from room to room (with or without device)? Independent  Stairs: Did the patient need assistance with internal or external stairs (with or without device)? Unknown, pt reports she avoids stairs  Functional Cognition: Did the patient need help planning regular tasks such as shopping or remembering to take medications? Needed some help; pt's daughter would set up pill box.   Current Functional Level Cognition  Overall Cognitive Status: Within Functional Limits for tasks assessed Orientation Level: Oriented X4 Safety/Judgement: Decreased awareness of safety, Decreased awareness of deficits General Comments: Pt with poor awareness of current deficits - pt thinks she will be back to baseline upon return home.  Requires information repeated     Extremity Assessment (includes Sensation/Coordination)  Upper Extremity Assessment: Generalized weakness, LUE deficits/detail LUE Deficits / Details: Pt with limited shoulder ROM   Lower Extremity Assessment: Generalized weakness    ADLs  Overall ADL's : Needs assistance/impaired Eating/Feeding: Independent Grooming: Wash/dry hands, Wash/dry face, Oral care, Brushing hair, Minimal assistance, Standing Upper Body Bathing: Set up, Supervision/ safety, Sitting Lower Body Bathing: Moderate assistance, Sit to/from stand Upper Body Dressing : Supervision/safety, Set up, Sitting Lower Body Dressing: Maximal assistance, Sit to/from stand Toilet Transfer: Moderate assistance, Ambulation, Comfort height toilet, BSC, Grab bars, RW Toilet Transfer Details (indicate cue  type and reason): assist to boost up from toilet  Toileting- Clothing Manipulation and Hygiene: Moderate assistance, Sit to/from stand Functional mobility during ADLs: Minimal assistance, Rolling walker General ADL Comments: DOE 3/4 after ambulating to/from bathroom     Mobility  Overal bed mobility: Needs Assistance Bed Mobility: Supine to Sit Supine to sit: Supervision General bed mobility comments: increased time, increased effort    Transfers  Overall transfer level: Needs assistance Equipment used: Rolling walker (2 wheeled) Transfers: Sit to/from Stand Sit to Stand: Mod assist Stand pivot transfers: Min assist, Min guard General transfer comment: lifting help from EOB and from chair in hallway    Ambulation / Gait / Stairs / Wheelchair Mobility  Ambulation/Gait Ambulation/Gait assistance: Herbalist (Feet): 35 Feet(x 2) Assistive device: Rolling walker (2 wheeled) Gait Pattern/deviations: Step-to pattern, Decreased stride length, Trunk flexed General Gait Details: slow and mildly antalgic, decreased dorsiflexion with post op shoe esp on L Gait velocity: slow Gait velocity interpretation: <1.8 ft/sec, indicate of risk for recurrent falls    Posture / Balance Balance Overall balance assessment: Needs assistance Sitting-balance support: Feet supported, Single extremity supported Sitting balance-Leahy Scale: Good Standing balance support: Bilateral upper extremity supported Standing balance-Leahy Scale: Poor Standing balance  comment: requires UE support and external support    Special needs/care consideration BiPAP/CPAP: no CPM: no Continuous Drip IV: 0.9% sodium chloride infusion; lactated ringers infusion  Dialysis: no        Days: no Life Vest: no Oxygen: no Special Bed: no Trach Size: no Wound Vac (area): no      Location: no Skin: Left groin incision, incision left leg x3; Left thigh incision x2, left foot laceration; ulcer to medial and  lateral sides of left foot near bases of phalanges; missing big toe on right foot Bowel mgmt:Last BM: 05/04/18 in chart; 05/05/18 per pt. (continent) Bladder mgmt: continent; use of BSC Diabetic mgmt: yes     Previous Home Environment Living Arrangements: Alone Available Help at Discharge: Family, Available 24 hours/day Type of Home: House Home Layout: One level Home Access: Stairs to enter Entrance Stairs-Rails: None Technical brewer of Steps: 1 Bathroom Shower/Tub: Multimedia programmer: Programmer, systems: Yes Home Care Services: Yes Type of Home Care Services: Safety alert, Home RN(wound care)  Discharge Living Setting Plans for Discharge Living Setting: Patient's home, Alone Type of Home at Discharge: House Discharge Home Layout: One level Discharge Home Access: Ramped entrance, Level entry, Other (comment)(pt has small ledge to clear for entrance into house) Discharge Bathroom Shower/Tub: Walk-in shower Discharge Bathroom Toilet: Handicapped height(due to [facility] over commode for elevation) Discharge Bathroom Accessibility: Yes How Accessible: Accessible via walker(use of RW sidestepping) Does the patient have any problems obtaining your medications?: No  Social/Family/Support Systems Patient Roles: Other (Comment)(pt's daughter checks in on her 3x/day; pt cares for cat) Contact Information: (daughter Vivien Rota is emergency contact ) Anticipated Caregiver: Vivien Rota (daugther) and granddaugther Anticipated Ambulance person Information: Vivien Rota: home [phone]) (cell: [phone]) Ability/Limitations of Caregiver: supervision/set up A; with some assist for bathing as she did PTA Caregiver Availability: Intermittent Discharge Plan Discussed with Primary Caregiver: Yes(with pt and her daugther) Is Caregiver In Agreement with Plan?: Yes Does Caregiver/Family have Issues with Lodging/Transportation while Pt is in Rehab?: No   Goals/Additional  Needs Patient/Family Goal for Rehab: PT/OT: Mod I/Supervision; SLP: NA Expected length of stay: 5-7 days Cultural Considerations: NA Dietary Needs: Heart Healthy/Carb Modified; thin liquids Equipment Needs: TBD Pt/Family Agrees to Admission and willing to participate: Yes(Yes, pt and her daugther Vivien Rota) Program Orientation Provided & Reviewed with Pt/Caregiver Including Roles  & Responsibilities: Yes(pt and Vivien Rota)  Barriers to Discharge: Lack of/limited family support, Insurance for SNF coverage(unsure if pt has SNF days left)   Decrease burden of Care through IP rehab admission: Other    Possible need for SNF placement upon discharge: not anticipated; Pt has family that lives across the street with daughter and granddaughter that can assist intermittently at home. Pt has good prognosis for further progress and return to PLOF.    Patient Condition: This patient's condition remains as documented in the consult dated 05/04/18, in which the Rehabilitation Physician determined and documented that the patient's condition is potentially appropriate for intensive rehabilitative care in an inpatient rehabilitation facility pending completion of therapy evals to determine functional needs; Since evaluations completed, needs have been established as pt is not at baseline functioning since surgery. Pt is requiring Min A for transfers and is requiring Mod/Max A for ADLs. These areas have been addressed and pt is willing to come to CIR to address deficits in order to return home alone with intermittent assist. Will admit to inpatient rehab today 05/05/18.   Preadmission Screen Completed By:  Jhonnie Garner, 05/05/2018 2:28 PM  ______________________________________________________________________   Discussed status with Dr. Posey Pronto on 05/05/18 at 3:34PM and received telephone approval for admission today.  Admission Coordinator:  Jhonnie Garner, time 3:34PM/Date 05/05/18       Revision History

## 2018-05-05 NOTE — Progress Notes (Signed)
Pt's blood sugar was 296 at 2147 tonight. Pt's insulin is a future med to start at 0800 in the morning. I called PA [name] regarding the situation, and he did not want me to give any insulin tonight.

## 2018-05-05 NOTE — Progress Notes (Addendum)
[facility]  Subjective  - Patient is doing well and wants to return home with [facility] at discharge.  Her daughter is coming in today to discuss D/C planning.   Objective (!) 118/44 88 98.2 F (36.8 C) (Oral) 18 93%  Intake/Output Summary (Last 24 hours) at 05/05/2018 0716 Last data filed at 05/05/2018 0545 Gross per 24 hour  Intake 100 ml  Output 1100 ml  Net -1000 ml    Left LE palpable graft pulse lateral popliteal area.  Doppler AT/DP left LE Incision healing well without hematoma, hemostatic sponge over distal incision from post op.   Left first MT head ulcer clean without drainage and no erythema.  Silvadene placed over ulcer. Heart RRR Lungs non labored breathing  Assessment/Planning: POD # 2 right Fem-AT with hybrid vein and propaten   Patent graft with palpable pulse Patient's daughter plans to come in today to discuss discharge planning. HGB 8.5, PLT low 126.  I will order new labs for the am S/P 4 units PRBC and FFP pre-op chronic anemia and surgical blood loss anemia.    Roxy Horseman 05/05/2018 7:16 AM -- Pt states she has pain controlled enough for d/c Possible d/c home today if PT/OT disposition in place Acute blood loss anemia asymptomatic will transfuse today in light of age probably equilibration Thrombocytopenia probably consumptive from OR trend Patent bypass with 2+ graft pulse biphasic DP doppler Incisions healing  Ruta Hinds, MD [facility] [place]: [phone] Pager: [phone]  Laboratory Lab Results: Recent Labs    05/04/18 0513 05/05/18 0254  WBC 12.2* 12.2*  HGB 10.3* 8.5*  HCT 29.8* 26.2*  PLT 124* 126*   BMET Recent Labs    05/04/18 0342 05/05/18 0254  NA 136 134*  K 5.0 4.3  CL 103 103  CO2 24 25  GLUCOSE 252* 203*  BUN 17 19  CREATININE 1.16* 1.24*  CALCIUM 7.6* 7.6*    COAG Lab Results  Component Value Date   INR 1.05 05/02/2018   INR  1.08 03/17/2012   INR 1.03 01/19/2012   No results found for: PTT

## 2018-05-05 NOTE — Progress Notes (Signed)
Inpatient Diabetes Program Recommendations  AACE/ADA: New Consensus Statement on Inpatient Glycemic Control (2019)  Target Ranges:  Prepandial:   less than 140 mg/dL      Peak postprandial:   less than 180 mg/dL (1-2 hours)      Critically ill patients:  140 - 180 mg/dL   Results for ADLEE, PAAR (MRN [id number]) as of 05/05/2018 10:52  Ref. Range 05/04/2018 06:23 05/04/2018 11:13 05/04/2018 16:01 05/04/2018 21:48 05/05/2018 06:33  Glucose-Capillary Latest Ref Range: 70 - 99 mg/dL 228 (H) 188 (H) 225 (H) 229 (H) 185 (H)   Review of Glycemic Control  Current orders for Inpatient glycemic control: Novolog 0-15 units TID with meals, Glipizide 5 mg BID, Novolog 3 units TID with meals for meal coverage  Inpatient Diabetes Program Recommendations:  Correction (SSI): Please consider ordering Novolog 0-5 units QHS for bedtime correction. Insulin - Meal Coverage: Please consider increasing meal coverage insulin to Novolog 5 units TID with meals.  Thanks, Barnie Alderman, RN, MSN, CDE Diabetes Coordinator Inpatient Diabetes Program [phone] (Team Pager from 8am to 5pm)

## 2018-05-05 NOTE — Progress Notes (Addendum)
PROGRESS NOTE    Cynthia Ray  AUQ:[id number] DOB: May 09, 1926 DOA: 04/30/2018 PCP: Neale Burly, MD    Brief Narrative:  84 year old with past medical history relevant for coronary artery disease status post stent, COPD, hypertension, anemia, type 2 diabetes on insulin, peripheral vascular disease status post right femoropopliteal bypass who was admitted for nonhealing ulcer status post left external iliac common SFA profundoplasty, left femoral to anterior tibial bypass on 05/03/2018.   Assessment & Plan:   Principal Problem:   PAD (peripheral artery disease) (HCC) Active Problems:   HTN (hypertension)   Dyslipidemia   CAD (coronary artery disease)   Anemia   Hyponatremia   GERD (gastroesophageal reflux disease)   Type II diabetes mellitus (HCC)   COPD (chronic obstructive pulmonary disease) (HCC)   Hypomagnesemia   Edema   Chronic diastolic congestive heart failure ([place])   Diabetes mellitus type 2 in nonobese [facility])   #) Peripheral vascular disease with nonhealing ulcer on left:status post left external iliac common SFA profundoplasty, left femoral to anterior tibial bypass on 05/03/2018. -Vascular surgery is signed off - Pending discharge to [facility] inpatient rehab versus skilled nursing facility with rehab - Continue aspirin 81 mg - Continue holding clopidogrel 75 mg daily -Continue atorvastatin 80 mg daily  #) Hypertension/hyperlipidemia: -Continue amlodipine 10 mg daily -Per above continue statin - Continue metoprolol tartrate 25 mg twice daily -Continue lisinopril 40 mg daily  #) Anemia: Unclear if postoperative chronic she has an iron as an outpatient. -Monitor  #) Coronary artery disease status post stent: -Continue statin -Continue aspirin, holding clopidogrel - Continue beta-blocker -Continue ACE inhibitor  #) Type 2 diabetes: -Hold glipizide 5 mg twice daily -Hold metformin thousand milligrams twice daily -Sliding scale insulin, AC at bedtime  #)  Grade 1 diastolic dysfunction by echo on 05/02/2018: Currently patient appears to be euvolemic - Restart home furosemide 20 mg daily  Fluids: Tolerating Electrodes: Monitor and supplement Nutrition: Carb diet  Prophylaxis: Enoxaparin  Disposition: Pending discharge to [facility] inpatient rehab versus skilled nursing facility  Full code    Consultants:   Vascular surgery  Procedures:  Echo 05/02/2018:- Left ventricle: The cavity size was normal. Wall thickness was   increased in a pattern of mild LVH. Systolic function was   vigorous. The estimated ejection fraction was in the range of 65%   to 70%. Wall motion was normal; there were no regional wall   motion abnormalities. Doppler parameters are consistent with   abnormal left ventricular relaxation (grade 1 diastolic   dysfunction). Doppler parameters are consistent with high   ventricular filling pressure. - Aortic valve: Trileaflet; moderately thickened, moderately   calcified leaflets. Valve mobility was restricted. Transvalvular   velocity was within the normal range. There was no stenosis. - Mitral valve: There was mild regurgitation.  - Tricuspid valve: There was mild regurgitation.  05/03/2018:Endarterectomy of left external iliac common femoral superficial femoral artery with profundoplasty (bovine pericardial patch), left femoral to anterior tibial artery bypass using composite 6 mm propaten PTFE and reversed greater saphenous vein  Antimicrobials:   Perioperative   Subjective: Patient reports she is doing well.  She has minimal pain.  She is eager to be discharged home however she understands that she might need skilled nursing facility.  She denies any chest pain, cough, congestion, rhinorrhea.  Objective: Vitals:   05/05/18 0755 05/05/18 0943 05/05/18 0958 05/05/18 1030  BP: (!) 122/53 (!) 123/56 (!) 126/50 (!) 120/52  Pulse: 88 95 92 92  Resp:  20 19 16  (!) 24  Temp: 98.1 F (36.7 C)  98.1 F (36.7 C) 98.4 F  (36.9 C)  TempSrc: Oral  Oral Oral  SpO2: 98% 95% 95% 97%  Weight:      Height:        Intake/Output Summary (Last 24 hours) at 05/05/2018 1142 Last data filed at 05/05/2018 1011 Gross per 24 hour  Intake 130 ml  Output 1100 ml  Net -970 ml   Filed Weights   05/02/18 0500 05/03/18 0416 05/03/18 0953  Weight: 59.9 kg 57.6 kg 57.6 kg    Examination:  General exam: Appears calm and comfortable  Respiratory system: Clear to auscultation. Respiratory effort normal. Cardiovascular system: Regular rate and rhythm, no murmurs. Gastrointestinal system: Abdomen is nondistended, soft and nontender. No organomegaly or masses felt. Normal bowel sounds heard. Central nervous system: Grossly intact, moving all extremities Extremities: Bilateral left greater than right lower extremity swelling, diminished pulses bilaterally Skin: Left-sided incision areas clean dry and intact, nonhealing ulcers currently dressed Psychiatry: Judgement and insight appear normal. Mood & affect appropriate.     Data Reviewed: I have personally reviewed following labs and imaging studies  CBC: Recent Labs  Lab 04/30/18 1646 05/01/18 0752 05/02/18 0503 05/02/18 2218 05/03/18 1753 05/03/18 2028 05/04/18 0513 05/05/18 0254  WBC 6.2 5.3 5.4 6.2  --   --  12.2* 12.2*  NEUTROABS 3.9 2.7 2.9  --   --   --  9.7* 8.9*  HGB 9.2* 9.2* 9.0* 9.1* 5.4* 10.9* 10.3* 8.5*  HCT 29.3* 29.2* 28.3* 28.3* 16.0* 32.0* 29.8* 26.2*  MCV 89.6 88.2 87.6 88.4  --   --  87.1 88.5  PLT 323 338 313 323  --   --  124* 381*   Basic Metabolic Panel: Recent Labs  Lab 04/30/18 1646 05/01/18 0752 05/02/18 0503 05/02/18 2218 05/03/18 0324 05/03/18 1753 05/03/18 2028 05/04/18 0342 05/05/18 0254  NA 134* 139 138 136 137 138 138 136 134*  K 3.5 3.5 3.6 3.3* 3.2* 4.6 4.8 5.0 4.3  CL 97* 99 99 98 99  --   --  103 103  CO2 28 28 28 31 28   --   --  24 25  GLUCOSE 203* 103* 170* 181* 152* 232* 268* 252* 203*  BUN 17 13 15 18 16   --    --  17 19  CREATININE 1.25* 1.18* 1.18* 1.29* 1.20*  --   --  1.16* 1.24*  CALCIUM 8.3* 8.7* 8.5* 8.4* 8.2*  --   --  7.6* 7.6*  MG 1.6* 2.0 1.7  --   --   --   --   --   --   PHOS 2.9  --   --   --   --   --   --   --   --    GFR: Estimated Creatinine Clearance: 24.4 mL/min (A) (by C-G formula based on SCr of 1.24 mg/dL (H)). Liver Function Tests: Recent Labs  Lab 04/30/18 1646 05/02/18 2218  AST 14* 15  ALT 8 10  ALKPHOS 99 87  BILITOT 0.4 0.5  PROT 5.6* 5.4*  ALBUMIN 2.6* 2.5*   No results for input(s): LIPASE, AMYLASE in the last 168 hours. No results for input(s): AMMONIA in the last 168 hours. Coagulation Profile: Recent Labs  Lab 05/02/18 2218  INR 1.05   Cardiac Enzymes: No results for input(s): CKTOTAL, CKMB, CKMBINDEX, TROPONINI in the last 168 hours. BNP (last 3 results) No results for input(s): PROBNP  in the last 8760 hours. HbA1C: No results for input(s): HGBA1C in the last 72 hours. CBG: Recent Labs  Lab 05/04/18 1113 05/04/18 1601 05/04/18 2148 05/05/18 0633 05/05/18 1111  GLUCAP 188* 225* 229* 185* 190*   Lipid Profile: No results for input(s): CHOL, HDL, LDLCALC, TRIG, CHOLHDL, LDLDIRECT in the last 72 hours. Thyroid Function Tests: No results for input(s): TSH, T4TOTAL, FREET4, T3FREE, THYROIDAB in the last 72 hours. Anemia Panel: No results for input(s): VITAMINB12, FOLATE, FERRITIN, TIBC, IRON, RETICCTPCT in the last 72 hours. Sepsis Labs: No results for input(s): PROCALCITON, LATICACIDVEN in the last 168 hours.  Recent Results (from the past 240 hour(s))  MRSA PCR Screening     Status: None   Collection Time: 04/30/18  8:33 PM  Result Value Ref Range Status   MRSA by PCR NEGATIVE NEGATIVE Final    Comment:        The GeneXpert MRSA Assay (FDA approved for NASAL specimens only), is one component of a comprehensive MRSA colonization surveillance program. It is not intended to diagnose MRSA infection nor to guide or monitor  treatment for MRSA infections. Performed at [facility], [place] [address], [place], [place] [address]          Radiology Studies: No results found.      Scheduled Meds: . amLODipine  10 mg Oral Daily  . aspirin  81 mg Oral Daily  . atorvastatin  80 mg Oral Daily  . calcium carbonate  1 tablet Oral Q breakfast  . cholecalciferol  1,000 Units Oral Daily  . docusate sodium  100 mg Oral Daily  . enoxaparin (LOVENOX) injection  30 mg Subcutaneous Q24H  . furosemide  40 mg Oral Daily  . glipiZIDE  5 mg Oral BID AC  . insulin aspart  0-15 Units Subcutaneous TID WC  . insulin aspart  3 Units Subcutaneous TID WC  . iron polysaccharides  150 mg Oral Daily  . lisinopril  40 mg Oral Daily  . magnesium oxide  400 mg Oral Daily  . metoprolol tartrate  25 mg Oral BID  . pantoprazole  40 mg Oral Q2200  . silver sulfADIAZINE  1 application Topical Daily  . vitamin C  500 mg Oral Daily   Continuous Infusions: . sodium chloride    . sodium chloride Stopped (05/04/18 0807)  . lactated ringers 10 mL/hr at 05/03/18 1002  . magnesium sulfate 1 - 4 g bolus IVPB       LOS: 5 days    Time spent: [place], MD [facility]  If 7PM-7AM, please contact night-coverage [email or web address] Password [email or web address] 05/05/2018, 11:42 AM

## 2018-05-05 NOTE — Discharge Summary (Signed)
Physician Discharge Summary  Cynthia Ray MLY:[id number] DOB: April 14, 1926 DOA: 04/30/2018  PCP: Neale Burly, MD  Admit date: 04/30/2018 Discharge date: 05/05/2018  Admitted From: Home Disposition:  CIR  Recommendations for Outpatient Follow-up:  1. Follow up with PCP in 1-2 weeks 2. Please obtain BMP/CBC in one week 3. Please repeat hemoglobin and hematocrit in 1 day  Home Health:No  Equipment/Devices: None  Discharge Condition: stable CODE STATUS: FULL Diet recommendation: Heart Healthy/ carb modified  Brief/Interim Summary:  #) Peripheral vascular disease with nonhealing ulcer on left: Patient was admitted and had left external iliac common SFA profundoplasty, left femoral to anterior tibial bypass on 05/03/2018.  Patient was continued on atorvastatin 80 mg and aspirin 81 mg.  Her clopidogrel was held perioperatively however can be restarted on discharge.  #) Hypertension/hyperlipidemia: Patient was continued on amlodipine, statin, metoprolol, lisinopril.  #) Postoperative anemia: This was secondary to surgery.  Patient received blood transfusion prior to transfer.  She should have her hemoglobin hematocrit checked in 1 day.  #) Coronary artery disease status post stent: Patient was continued on statin, beta-blocker, aspirin.  If she may restart her clopidogrel on discharge  #) Type 2 diabetes: Patient was maintained on sliding scale here.  She may restart her glipizide and metformin on discharge.  #) Is grade 1 diastolic dysfunction: Patient was continued on home oral furosemide.  Discharge Diagnoses:  Principal Problem:   PAD (peripheral artery disease) (HCC) Active Problems:   HTN (hypertension)   Dyslipidemia   CAD (coronary artery disease)   Anemia   Hyponatremia   GERD (gastroesophageal reflux disease)   Type II diabetes mellitus (HCC)   COPD (chronic obstructive pulmonary disease) (HCC)   Hypomagnesemia   Edema   Chronic diastolic congestive heart failure  (HCC)   Diabetes mellitus type 2 in nonobese [facility])    Discharge Instructions  Discharge Instructions    Call MD for:  difficulty breathing, headache or visual disturbances   Complete by:  As directed    Call MD for:  hives   Complete by:  As directed    Call MD for:  persistant dizziness or light-headedness   Complete by:  As directed    Call MD for:  persistant nausea and vomiting   Complete by:  As directed    Call MD for:  redness, tenderness, or signs of infection (pain, swelling, redness, odor or green/yellow discharge around incision site)   Complete by:  As directed    Call MD for:  severe uncontrolled pain   Complete by:  As directed    Call MD for:  temperature >100.4   Complete by:  As directed    Diet - low sodium heart healthy   Complete by:  As directed    Discharge instructions   Complete by:  As directed    Please follow-up with your PCP in 1 to 2 weeks.   Increase activity slowly   Complete by:  As directed      Allergies as of 05/05/2018      Reactions   Ibuprofen Other (See Comments)   Dr. Britta Mccreedy informed patient not to take this due to ulcer      Medication List    TAKE these medications   acetaminophen 650 MG CR tablet Commonly known as:  TYLENOL Take 1,300 mg by mouth every 8 (eight) hours as needed for pain.   amLODipine 10 MG tablet Commonly known as:  NORVASC TAKE ONE TABLET BY MOUTH DAILY.  aspirin 81 MG chewable tablet Chew 81 mg by mouth daily.   atorvastatin 80 MG tablet Commonly known as:  LIPITOR Take 80 mg by mouth daily.   calcium carbonate 500 MG chewable tablet Commonly known as:  TUMS - dosed in mg elemental calcium Chew 1 tablet by mouth daily.   cholecalciferol 1000 units tablet Commonly known as:  VITAMIN D Take 1,000 Units by mouth daily.   clopidogrel 75 MG tablet Commonly known as:  PLAVIX TAKE ONE TABLET BY MOUTH DAILY.   furosemide 20 MG tablet Commonly known as:  LASIX Take 20 mg by mouth daily.    glipiZIDE 5 MG tablet Commonly known as:  GLUCOTROL Take 5 mg by mouth 2 (two) times daily before a meal.   iron polysaccharides 150 MG capsule Commonly known as:  NIFEREX Take 150 mg by mouth daily.   lisinopril 40 MG tablet Commonly known as:  PRINIVIL,ZESTRIL Take 1 tablet (40 mg total) by mouth daily.   magnesium oxide 400 MG tablet Commonly known as:  MAG-OX Take 400 mg by mouth daily.   metFORMIN 1000 MG tablet Commonly known as:  GLUCOPHAGE Take 1,000 mg by mouth 2 (two) times daily with a meal.   metoprolol tartrate 25 MG tablet Commonly known as:  LOPRESSOR Take 1 tablet (25 mg total) by mouth 2 (two) times daily.   nitroGLYCERIN 0.4 MG SL tablet Commonly known as:  NITROSTAT Place 0.4 mg under the tongue every 5 (five) minutes as needed for chest pain.   silver sulfADIAZINE 1 % cream Commonly known as:  SILVADENE Apply 1 application topically daily.   SYSTANE 0.4-0.3 % Soln Generic drug:  Polyethyl Glycol-Propyl Glycol Place 1 drop into both eyes daily as needed (for dry eyes).   traMADol 50 MG tablet Commonly known as:  ULTRAM Take 50 mg by mouth 2 (two) times daily as needed for moderate pain.   vitamin C 500 MG tablet Commonly known as:  ASCORBIC ACID Take 500 mg by mouth daily.       Allergies  Allergen Reactions  . Ibuprofen Other (See Comments)    Dr. Britta Mccreedy informed patient not to take this due to ulcer     Consultations:  Vascular surgery   Procedures/Studies: Dg Chest Port 1 View  Result Date: 05/01/2018 CLINICAL DATA:  Preop.  History of COPD, diabetes, hypertension EXAM: PORTABLE CHEST 1 VIEW COMPARISON:  12/12/2017 FINDINGS: Linear atelectasis or scarring at the left base. Heart is upper limits normal in size. Right lung clear. No effusions or acute bony abnormality. IMPRESSION: Linear left base atelectasis or scarring. Electronically Signed   By: Rolm Baptise M.D.   On: 05/01/2018 14:32    Echo 05/02/2018:- Left ventricle: The  cavity size was normal. Wall thickness was increased in a pattern of mild LVH. Systolic function was vigorous. The estimated ejection fraction was in the range of 65% to 70%. Wall motion was normal; there were no regional wall motion abnormalities. Doppler parameters are consistent with abnormal left ventricular relaxation (grade 1 diastolic dysfunction). Doppler parameters are consistent with high ventricular filling pressure. - Aortic valve: Trileaflet; moderately thickened, moderately calcified leaflets. Valve mobility was restricted. Transvalvular velocity was within the normal range. There was no stenosis. - Mitral valve: There was mild regurgitation.  - Tricuspid valve: There was mild regurgitation.  05/03/2018:Endarterectomy of left external iliac common femoral superficial femoral artery with profundoplasty (bovine pericardial patch), left femoral to anterior tibial artery bypass using composite 6 mm propaten PTFE and reversed  greater saphenous vein   Subjective:   Discharge Exam: Vitals:   05/05/18 1428 05/05/18 1450  BP: (!) 117/49 (!) 119/50  Pulse: 90 90  Resp: 20 20  Temp: 99.6 F (37.6 C) 99.3 F (37.4 C)  SpO2: 96% 96%   Vitals:   05/05/18 1200 05/05/18 1256 05/05/18 1428 05/05/18 1450  BP: 130/79 (!) 119/52 (!) 117/49 (!) 119/50  Pulse: 88 88 90 90  Resp: 19 17 20 20   Temp:  99.3 F (37.4 C) 99.6 F (37.6 C) 99.3 F (37.4 C)  TempSrc:  Axillary Oral Oral  SpO2: 96% 94% 96% 96%  Weight:      Height:       General exam: Appears calm and comfortable  Respiratory system: Clear to auscultation. Respiratory effort normal. Cardiovascular system: Regular rate and rhythm, no murmurs. Gastrointestinal system: Abdomen is nondistended, soft and nontender. No organomegaly or masses felt. Normal bowel sounds heard. Central nervous system: Grossly intact, moving all extremities Extremities: Bilateral left greater than right lower extremity  swelling, diminished pulses bilaterally Skin: Left-sided incision areas clean dry and intact, nonhealing ulcers currently dressed Psychiatry: Judgement and insight appear normal. Mood & affect appropriate.   The results of significant diagnostics from this hospitalization (including imaging, microbiology, ancillary and laboratory) are listed below for reference.     Microbiology: Recent Results (from the past 240 hour(s))  MRSA PCR Screening     Status: None   Collection Time: 04/30/18  8:33 PM  Result Value Ref Range Status   MRSA by PCR NEGATIVE NEGATIVE Final    Comment:        The GeneXpert MRSA Assay (FDA approved for NASAL specimens only), is one component of a comprehensive MRSA colonization surveillance program. It is not intended to diagnose MRSA infection nor to guide or monitor treatment for MRSA infections. Performed at [facility], [place] [address] [place], [place] [address]      Labs: BNP (last 3 results) Recent Labs    04/30/18 1646  BNP 300.7*   Basic Metabolic Panel: Recent Labs  Lab 04/30/18 1646 05/01/18 0752 05/02/18 0503 05/02/18 2218 05/03/18 0324 05/03/18 1753 05/03/18 2028 05/04/18 0342 05/05/18 0254  NA 134* 139 138 136 137 138 138 136 134*  K 3.5 3.5 3.6 3.3* 3.2* 4.6 4.8 5.0 4.3  CL 97* 99 99 98 99  --   --  103 103  CO2 28 28 28 31 28   --   --  24 25  GLUCOSE 203* 103* 170* 181* 152* 232* 268* 252* 203*  BUN 17 13 15 18 16   --   --  17 19  CREATININE 1.25* 1.18* 1.18* 1.29* 1.20*  --   --  1.16* 1.24*  CALCIUM 8.3* 8.7* 8.5* 8.4* 8.2*  --   --  7.6* 7.6*  MG 1.6* 2.0 1.7  --   --   --   --   --   --   PHOS 2.9  --   --   --   --   --   --   --   --    Liver Function Tests: Recent Labs  Lab 04/30/18 1646 05/02/18 2218  AST 14* 15  ALT 8 10  ALKPHOS 99 87  BILITOT 0.4 0.5  PROT 5.6* 5.4*  ALBUMIN 2.6* 2.5*   No results for input(s): LIPASE, AMYLASE in the last 168 hours. No results for input(s): AMMONIA in the last  168 hours. CBC: Recent Labs  Lab  04/30/18 1646 05/01/18 0752 05/02/18 0503 05/02/18 2218 05/03/18 1753 05/03/18 2028 05/04/18 0513 05/05/18 0254  WBC 6.2 5.3 5.4 6.2  --   --  12.2* 12.2*  NEUTROABS 3.9 2.7 2.9  --   --   --  9.7* 8.9*  HGB 9.2* 9.2* 9.0* 9.1* 5.4* 10.9* 10.3* 8.5*  HCT 29.3* 29.2* 28.3* 28.3* 16.0* 32.0* 29.8* 26.2*  MCV 89.6 88.2 87.6 88.4  --   --  87.1 88.5  PLT 323 338 313 323  --   --  124* 126*   Cardiac Enzymes: No results for input(s): CKTOTAL, CKMB, CKMBINDEX, TROPONINI in the last 168 hours. BNP: Invalid input(s): POCBNP CBG: Recent Labs  Lab 05/04/18 1113 05/04/18 1601 05/04/18 2148 05/05/18 0633 05/05/18 1111  GLUCAP 188* 225* 229* 185* 190*   D-Dimer No results for input(s): DDIMER in the last 72 hours. Hgb A1c No results for input(s): HGBA1C in the last 72 hours. Lipid Profile No results for input(s): CHOL, HDL, LDLCALC, TRIG, CHOLHDL, LDLDIRECT in the last 72 hours. Thyroid function studies No results for input(s): TSH, T4TOTAL, T3FREE, THYROIDAB in the last 72 hours.  Invalid input(s): FREET3 Anemia work up No results for input(s): VITAMINB12, FOLATE, FERRITIN, TIBC, IRON, RETICCTPCT in the last 72 hours. Urinalysis    Component Value Date/Time   COLORURINE YELLOW 03/25/2012 0929   APPEARANCEUR CLEAR 03/25/2012 0929   LABSPEC 1.008 03/25/2012 0929   PHURINE 6.0 03/25/2012 0929   GLUCOSEU NEGATIVE 03/25/2012 0929   HGBUR NEGATIVE 03/25/2012 0929   BILIRUBINUR NEGATIVE 03/25/2012 0929   KETONESUR NEGATIVE 03/25/2012 0929   PROTEINUR NEGATIVE 03/25/2012 0929   UROBILINOGEN 0.2 03/25/2012 0929   NITRITE NEGATIVE 03/25/2012 0929   LEUKOCYTESUR NEGATIVE 03/25/2012 0929   Sepsis Labs Invalid input(s): PROCALCITONIN,  WBC,  LACTICIDVEN Microbiology Recent Results (from the past 240 hour(s))  MRSA PCR Screening     Status: None   Collection Time: 04/30/18  8:33 PM  Result Value Ref Range Status   MRSA by PCR NEGATIVE  NEGATIVE Final    Comment:        The GeneXpert MRSA Assay (FDA approved for NASAL specimens only), is one component of a comprehensive MRSA colonization surveillance program. It is not intended to diagnose MRSA infection nor to guide or monitor treatment for MRSA infections. Performed at [facility], [place] [address], [place], [place] [address]      Time coordinating discharge: Over 30 minutes  SIGNED:   Cristy Folks, MD  [facility] 05/05/2018, 4:23 PM  If 7PM-7AM, please contact night-coverage [email or web address] Password [email or web address]

## 2018-05-05 NOTE — Progress Notes (Signed)
Physical Therapy Treatment Patient Details Name: Cynthia Ray MRN: [id number] DOB: Mar 13, 1926 Today's Date: 05/05/2018    History of Present Illness Cynthia Ray is a 84 y.o. female who previously underwent right femoral to popliteal artery bypass and amputation of the distal tip of right first toe in August 2013 by Dr. Oneida Alar. Pt with fall on 5/5 sustaining R hip fracture, s/p ORIF, went to SNF, d/c in july with bilat foot ulcers. Pt now POD # 1 right Fem-AT with hybrid vein and propaten.    PT Comments    Patient progressing with mobility with increased distance with ambulation and improved endurance.  She required more assist for stability with flexed posture and lifting help from EOB for standing.  Continue to feel she is appropriate for CIR level rehab upon d/c.   Follow Up Recommendations  CIR;Supervision/Assistance - 24 hour     Equipment Recommendations  None recommended by PT    Recommendations for Other Services       Precautions / Restrictions Precautions Precautions: Fall Precaution Comments: non-healing bilat foot wounds Required Braces or Orthoses: Other Brace/Splint Other Brace/Splint: post op shoes bilaterally Restrictions Weight Bearing Restrictions: No    Mobility  Bed Mobility Overal bed mobility: Needs Assistance Bed Mobility: Supine to Sit     Supine to sit: Supervision     General bed mobility comments: increased time, increased effort  Transfers Overall transfer level: Needs assistance Equipment used: Rolling walker (2 wheeled) Transfers: Sit to/from Stand Sit to Stand: Mod assist         General transfer comment: lifting help from EOB and from chair in hallway  Ambulation/Gait Ambulation/Gait assistance: Min assist Gait Distance (Feet): 35 Feet(x 2) Assistive device: Rolling walker (2 wheeled) Gait Pattern/deviations: Step-to pattern;Decreased stride length;Trunk flexed     General Gait Details: slow and mildly antalgic,  decreased dorsiflexion with post op shoe esp on L   Stairs             Wheelchair Mobility    Modified Rankin (Stroke Patients Only)       Balance Overall balance assessment: Needs assistance Sitting-balance support: Feet supported;Single extremity supported Sitting balance-Leahy Scale: Good     Standing balance support: Bilateral upper extremity supported Standing balance-Leahy Scale: Poor Standing balance comment: requires UE support and external support                            Cognition Arousal/Alertness: Awake/alert Behavior During Therapy: WFL for tasks assessed/performed Overall Cognitive Status: Within Functional Limits for tasks assessed                                        Exercises General Exercises - Lower Extremity Ankle Circles/Pumps: AROM;10 reps;Both;Supine Heel Slides: AROM;AAROM;Both;Supine;5 reps    General Comments        Pertinent Vitals/Pain Pain Assessment: Faces Faces Pain Scale: Hurts little more Pain Location: L LE during ambulation Pain Descriptors / Indicators: Sore Pain Intervention(s): Monitored during session;Repositioned    Home Living                      Prior Function            PT Goals (current goals can now be found in the care plan section) Progress towards PT goals: Progressing toward goals    Frequency  Min 3X/week      PT Plan Current plan remains appropriate    Co-evaluation              AM-PAC PT "6 Clicks" Daily Activity  Outcome Measure  Difficulty turning over in bed (including adjusting bedclothes, sheets and blankets)?: Unable Difficulty moving from lying on back to sitting on the side of the bed? : Unable Difficulty sitting down on and standing up from a chair with arms (e.g., wheelchair, bedside commode, etc,.)?: Unable Help needed moving to and from a bed to chair (including a wheelchair)?: A Lot Help needed walking in hospital room?: A  Little Help needed climbing 3-5 steps with a railing? : A Lot 6 Click Score: 10    End of Session Equipment Utilized During Treatment: Gait belt;Other (comment)(bilateral post op shoes) Activity Tolerance: Patient tolerated treatment well Patient left: in chair;with call bell/phone within reach   PT Visit Diagnosis: Unsteadiness on feet (R26.81);Difficulty in walking, not elsewhere classified (R26.2);Pain Pain - Right/Left: Left Pain - part of body: Leg     Time: 1336-1400 PT Time Calculation (min) (ACUTE ONLY): 24 min  Charges:  $Gait Training: 8-22 mins $Therapeutic Activity: 8-22 mins                     Magda Kiel, [place] [facility] [phone] 05/05/2018    Reginia Naas 05/05/2018, 4:29 PM

## 2018-05-05 NOTE — Progress Notes (Signed)
Physical Medicine and Rehabilitation Consult Reason for Consult: Decreased functional mobility Referring Physician: [facility]     HPI: Cynthia Ray is a 84 y.o. right-handed female with history of diastolic congestive heart, CAD with stenting, failure, COPD, type 2 diabetes mellitus, right hip fracture May 2019 and received therapies at a skilled nursing facility, PVD with left SFA stenting maintained on Plavix and aspirin as well as 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  Per chart review and patient, patient lives alone in [place], [place].  Used a walker prior to admission and was receiving home health therapies.  Daughter across the street provides assistance as needed.  One level home.  Presented 04/30/2018 with nonhealing ulcers of the left foot.  Recent aortogram showed subtotal occlusion left distal femoral artery as well as occlusion left superficial femoral-popliteal artery.  Underwent endarterectomy of left external iliac common femoral superficial femoral artery with profundoplasty, left femoral to anterior tibial artery bypass 05/03/2018 per Dr. Oneida Alar.  Hospital course leukocytosis.  Subcutaneous Lovenox for DVT prophylaxis.  Therapy evaluations pending.  MD has requested physical medicine rehab consult.   Review of Systems  Constitutional: Positive for malaise/fatigue. Negative for chills and fever.  HENT: Negative for hearing loss.   Eyes: Negative for blurred vision and double vision.  Respiratory: Positive for shortness of breath.   Cardiovascular: Positive for leg swelling. Negative for chest pain.  Gastrointestinal: Positive for constipation. Negative for nausea and vomiting.       GERD  Genitourinary: Negative for dysuria, flank pain and hematuria.  Musculoskeletal: Positive for myalgias.  Skin: Negative for rash.  Neurological: Positive for weakness.  All other systems reviewed and are negative.   Past Medical History:  Diagnosis Date  . Anemia    .  Arthritis      "dr said I have it in my back" Lower   . Carotid artery occlusion    . CHF (congestive heart failure) ([place]) 12/2017  . COPD (chronic obstructive pulmonary disease) ([place])    . Fractured hip ([place])      Fell and hurt R hip  . GERD (gastroesophageal reflux disease)    . History of blood transfusion 11/11/11  . Hyperlipidemia    . Hypertension    . PVD (peripheral vascular disease) ([place])      Stents left SFA 2004 Dr. Albertine Patricia  . Type II diabetes mellitus ([place])           Past Surgical History:  Procedure Laterality Date  . ABDOMINAL AORTAGRAM N/A 10/31/2011    Procedure: ABDOMINAL Maxcine Ham;  Surgeon: Elam Dutch, MD;  Location: [facility] CATH LAB;  Service: Cardiovascular;  Laterality: N/A;  . ABDOMINAL AORTOGRAM W/LOWER EXTREMITY N/A 04/16/2018    Procedure: ABDOMINAL AORTOGRAM W/LOWER EXTREMITY;  Surgeon: Elam Dutch, MD;  Location: [facility] CV LAB;  Service: Cardiovascular;  Laterality: N/A;  . AMPUTATION   03/23/2012    Procedure: AMPUTATION DIGIT;  Surgeon: Elam Dutch, MD;  Location: [facility] OR;  Service: Vascular;  Laterality: Right;  Right Femoral endarterectomy with profundaplasty; right femoral-popliteal bypass with nonreversed saphenous vein; and right first toe amputation  . CATARACT EXTRACTION W/ INTRAOCULAR LENS  IMPLANT, BILATERAL   2002  . COLONOSCOPY      . CORONARY ANGIOPLASTY WITH STENT PLACEMENT   01/19/12    "1; first one I've ever had"  . EYE SURGERY   2009    Laser bilateral   . FEMORAL ENDARTERECTOMY   03/23/2012  right  . FEMORAL-POPLITEAL BYPASS GRAFT   03/23/2012    Procedure: BYPASS GRAFT FEMORAL-POPLITEAL ARTERY;  Surgeon: Elam Dutch, MD;  Location: [facility] OR;  Service: Vascular;  Laterality: Right;  Right Femoral endarterectomy with profundaplasty; right femoral-popliteal bypass with nonreversed saphenous vein; and right first toe amputation  . HIP FRACTURE SURGERY Right 11/2017  . PERCUTANEOUS CORONARY STENT INTERVENTION (PCI-S) N/A  01/19/2012    Procedure: PERCUTANEOUS CORONARY STENT INTERVENTION (PCI-S);  Surgeon: Peter M Martinique, MD;  Location: [facility] CATH LAB;  Service: Cardiovascular;  Laterality: N/A;  . PERIPHERAL ARTERIAL STENT GRAFT   2003    LLE  . POSTERIOR LAMINECTOMY / DECOMPRESSION LUMBAR SPINE   1989  . REFRACTIVE SURGERY   2009    bilaterally  . TONSILLECTOMY AND ADENOIDECTOMY   1946         Family History  Problem Relation Age of Onset  . Stroke Father 18  . Hypertension Father    . Stroke Mother 82  . Diabetes Mother    . Hypertension Mother    . Diabetes Sister    . Hyperlipidemia Sister    . Hypertension Sister    . Diabetes Brother    . Hypertension Brother      Social History:  reports that she quit smoking about 30 years ago. Her smoking use included cigarettes. She started smoking about 54 years ago. She has a 24.00 pack-year smoking history. She has never used smokeless tobacco. She reports that she does not drink alcohol or use drugs. Allergies:       Allergies  Allergen Reactions  . Ibuprofen Other (See Comments)      Dr. Britta Mccreedy informed patient not to take this due to ulcer            Medications Prior to Admission  Medication Sig Dispense Refill  . acetaminophen (TYLENOL) 650 MG CR tablet Take 1,300 mg by mouth every 8 (eight) hours as needed for pain.      Marland Kitchen amLODipine (NORVASC) 10 MG tablet TAKE ONE TABLET BY MOUTH DAILY. (Patient taking differently: Take 10 mg by mouth daily. ) 90 tablet 1  . aspirin 81 MG chewable tablet Chew 81 mg by mouth daily.       Marland Kitchen atorvastatin (LIPITOR) 80 MG tablet Take 80 mg by mouth daily.      . calcium carbonate (TUMS - DOSED IN MG ELEMENTAL CALCIUM) 500 MG chewable tablet Chew 1 tablet by mouth daily.      . cholecalciferol (VITAMIN D) 1000 UNITS tablet Take 1,000 Units by mouth daily.      . clopidogrel (PLAVIX) 75 MG tablet TAKE ONE TABLET BY MOUTH DAILY. (Patient taking differently: Take 75 mg by mouth daily. ) 90 tablet 1  . furosemide  (LASIX) 20 MG tablet Take 20 mg by mouth daily.   0  . glipiZIDE (GLUCOTROL) 5 MG tablet Take 5 mg by mouth 2 (two) times daily before a meal.      . metoprolol tartrate (LOPRESSOR) 25 MG tablet Take 1 tablet (25 mg total) by mouth 2 (two) times daily. 180 tablet 3  . traMADol (ULTRAM) 50 MG tablet Take 50 mg by mouth 2 (two) times daily as needed for moderate pain.       . iron polysaccharides (NIFEREX) 150 MG capsule Take 150 mg by mouth daily.       Marland Kitchen lisinopril (PRINIVIL,ZESTRIL) 40 MG tablet Take 1 tablet (40 mg total) by mouth daily. 30 tablet 6  . magnesium  oxide (MAG-OX) 400 MG tablet Take 400 mg by mouth daily.      . metFORMIN (GLUCOPHAGE) 1000 MG tablet Take 1,000 mg by mouth 2 (two) times daily with a meal.       . nitroGLYCERIN (NITROSTAT) 0.4 MG SL tablet Place 0.4 mg under the tongue every 5 (five) minutes as needed for chest pain.      Vladimir Faster Glycol-Propyl Glycol (SYSTANE) 0.4-0.3 % SOLN Place 1 drop into both eyes daily as needed (for dry eyes).      . silver sulfADIAZINE (SILVADENE) 1 % cream Apply 1 application topically daily.      . vitamin C (ASCORBIC ACID) 500 MG tablet Take 500 mg by mouth daily.           Home: Home Living Family/patient expects to be discharged to:: Private residence Living Arrangements: Alone  Functional History: Functional Status:  Mobility:   ADL:   Cognition: Cognition Orientation Level: Oriented to person, Oriented to place, Oriented to situation   Blood pressure (!) 124/53, pulse 80, temperature 98 F (36.7 C), temperature source Oral, resp. rate 18, height 5' (1.524 m), weight 57.6 kg, SpO2 98 %. Physical Exam  Vitals reviewed. Constitutional: She is oriented to person, place, and time. She appears well-developed and well-nourished.  HENT:  Head: Normocephalic and atraumatic.  Eyes: EOM are normal. Right eye exhibits no discharge. Left eye exhibits no discharge.  Neck: Normal range of motion. Neck supple. No thyromegaly  present.  Cardiovascular: Normal rate and regular rhythm.  Respiratory: Effort normal and breath sounds normal. No respiratory distress.  GI: Soft. Bowel sounds are normal. She exhibits no distension.  Musculoskeletal:  LE edema RLE 1st digit amputation  Neurological: She is alert and oriented to person, place, and time.  Motor: RUE: 5/5 proximal to distal LUE: Shoulder abduction 2+/5, distally 4+/5 LLE: HF, KE 3+/5, ADF 3+/5 RLE: HF, KE 3/5, ADF 2+/5  Skin: Skin is warm and dry.  Bypass sites are dressed  Psychiatric: She has a normal mood and affect. Her behavior is normal.      Lab Results Last 24 Hours        Results for orders placed or performed during the hospital encounter of 04/30/18 (from the past 24 hour(s))  Glucose, capillary     Status: Abnormal    Collection Time: 05/03/18  9:30 AM  Result Value Ref Range    Glucose-Capillary 129 (H) 70 - 99 mg/dL  Prepare RBC     Status: None    Collection Time: 05/03/18  5:58 PM  Result Value Ref Range    Order Confirmation          ORDER PROCESSED BY BLOOD BANK Performed at [facility], [address], [place], [place] [address]    Prepare fresh frozen plasma     Status: None (Preliminary result)    Collection Time: 05/03/18  6:09 PM  Result Value Ref Range    Unit Number [id number]      Blood Component Type THW PLS APHR      Unit division 00      Status of Unit ISSUED      Transfusion Status OK TO TRANSFUSE      Unit Number [id number]      Blood Component Type THW PLS APHR      Unit division B0      Status of Unit ISSUED      Transfusion Status  OK TO TRANSFUSE Performed at [facility], [place] [address], [place], [place] [address]    Glucose, capillary     Status: Abnormal    Collection Time: 05/03/18  7:28 PM  Result Value Ref Range    Glucose-Capillary 240 (H) 70 - 99 mg/dL  Glucose, capillary     Status: Abnormal    Collection Time: 05/03/18  9:03 PM  Result Value Ref Range     Glucose-Capillary 284 (H) 70 - 99 mg/dL  Basic metabolic panel     Status: Abnormal    Collection Time: 05/04/18  3:42 AM  Result Value Ref Range    Sodium 136 135 - 145 mmol/L    Potassium 5.0 3.5 - 5.1 mmol/L    Chloride 103 98 - 111 mmol/L    CO2 24 22 - 32 mmol/L    Glucose, Bld 252 (H) 70 - 99 mg/dL    BUN 17 8 - 23 mg/dL    Creatinine, Ser 1.16 (H) 0.44 - 1.00 mg/dL    Calcium 7.6 (L) 8.9 - 10.3 mg/dL    GFR calc non Af Amer 40 (L) >60 mL/min    GFR calc Af Amer 47 (L) >60 mL/min    Anion gap 9 5 - 15  CBC with Differential/Platelet     Status: Abnormal    Collection Time: 05/04/18  5:13 AM  Result Value Ref Range    WBC 12.2 (H) 4.0 - 10.5 K/uL    RBC 3.42 (L) 3.87 - 5.11 MIL/uL    Hemoglobin 10.3 (L) 12.0 - 15.0 g/dL    HCT 29.8 (L) 36.0 - 46.0 %    MCV 87.1 80.0 - 100.0 fL    MCH 30.1 26.0 - 34.0 pg    MCHC 34.6 30.0 - 36.0 g/dL    RDW 14.6 11.5 - 15.5 %    Platelets 124 (L) 150 - 400 K/uL    Neutrophils Relative % 79 %    Neutro Abs 9.7 (H) 1.7 - 7.7 K/uL    Lymphocytes Relative 9 %    Lymphs Abs 1.1 0.7 - 4.0 K/uL    Monocytes Relative 11 %    Monocytes Absolute 1.3 (H) 0.1 - 1.0 K/uL    Eosinophils Relative 0 %    Eosinophils Absolute 0.0 0.0 - 0.5 K/uL    Basophils Relative 0 %    Basophils Absolute 0.0 0.0 - 0.1 K/uL    Immature Granulocytes 1 %    Abs Immature Granulocytes 0.1 (H) 0.00 - 0.07 K/uL  Glucose, capillary     Status: Abnormal    Collection Time: 05/04/18  6:23 AM  Result Value Ref Range    Glucose-Capillary 228 (H) 70 - 99 mg/dL      Imaging Results (Last 48 hours)  No results found.     Assessment/Plan: Diagnosis: Debility Labs independently reviewed.  Records reviewed and summated above.   1. Does the need for close, 24 hr/day medical supervision in concert with the patient's rehab needs make it unreasonable for this patient to be served in a less intensive setting? Potentially  2. Co-Morbidities requiring supervision/potential  complications: diastolic congestive heart (monitor for signs/symptoms of fluid overload), CAD with stenting, failure (cont meds), COPD (monitor RR and O2 sats with increased mobility), type 2 DM (Monitor in accordance with exercise and adjust meds as necessary), right hip fracture, PVD with left SFA, leukocytosis (cont to monitor for signs and symptoms of infection, further workup if indicated) 3. Due to  bladder management, bowel management, safety, skin/wound care, disease management, pain management and patient education, does the patient require 24 hr/day rehab nursing? Potentially 4. Does the patient require coordinated care of a physician, rehab nurse, PT (1-2 hrs/day, 5 days/week) and OT (1-2 hrs/day, 5 days/week) to address physical and functional deficits in the context of the above medical diagnosis(es)? Potentially Addressing deficits in the following areas: balance, endurance, locomotion, strength, transferring, bathing, dressing, toileting and psychosocial support 5. Can the patient actively participate in an intensive therapy program of at least 3 hrs of therapy per day at least 5 days per week? Potentially 6. The potential for patient to make measurable gains while on inpatient rehab is TBD 7. Anticipated functional outcomes upon discharge from inpatient rehab are TBD  with PT, TBD with OT, n/a with SLP. 8. Estimated rehab length of stay to reach the above functional goals is: TBD. 9. Anticipated D/C setting: Home 10. Anticipated post D/C treatments: HH therapy and Home excercise program 11. Overall Rehab/Functional Prognosis: excellent and good   RECOMMENDATIONS: This patient's condition is appropriate for continued rehabilitative care in the following setting: Will need to await completion of therapy evals, however, of note, pt prefers to [facility] with [facility]. Patient has agreed to participate in recommended program. Potentially Note that insurance prior authorization may be required for  reimbursement for recommended care.   Comment: Rehab Admissions Coordinator to follow up.     I have personally performed a face to face diagnostic evaluation, including, but not limited to relevant history and physical exam findings, of this patient and developed relevant assessment and plan.  Additionally, I have reviewed and concur with the physician assistant's documentation above.    Delice Lesch, MD, ABPMR Lavon Paganini Angiulli, PA-C 05/04/2018        Revision History

## 2018-05-05 NOTE — PMR Pre-admission (Addendum)
PMR Admission Coordinator Pre-Admission Assessment  Patient: Cynthia Ray is an 84 y.o., female MRN: [id number] DOB: [birth year] Height: 5' (152.4 cm) Weight: 57.6 kg              Insurance Information HMO:     PPO:      PCP:      IPA:      80/20: Yes     OTHER:  PRIMARY: Medicare Part A and B      Policy#: [id number]      Subscriber: Patient CM Name:       Phone#:      Fax#:  Pre-Cert#:       Employer:  Benefits:  Phone #: NA     Name: Verified eligibility online via [employer] on 05/04/18 Eff. Date: part A effective: 05/28/93; Part B effective: 08/29/95     Deduct: $1,364      Out of Pocket Max: NA      Life Max: NA CIR: Covered per Medicare guidelines once yearly deductible is met      SNF: days 1-20, 100%; days 21-100, 80%  Outpatient: 80%     Co-Pay: 20% Home Health: 100%      Co-Pay:  DME: 80%     Co-Pay: 20% Providers: Pt's choice SECONDARY: Generic Insurance       Policy#: [id number]      Subscriber: Patient CM Name:       Phone#:      Fax#:  Pre-Cert#:      Employer:  Benefits:  Phone #: [phone]     Name:  Eff. Date:      Deduct:       Out of Pocket Max:       Life Max:  CIR:       SNF:  Outpatient:      Co-Pay:  Home Health:       Co-Pay:  DME:      Co-Pay:   Medicaid Application Date:       Case Manager:  Disability Application Date:       Case Worker:   Emergency Contact Information Contact Information    Name Relation Home Work Mobile   Fain,Toni Daughter [phone]       Current Medical History  Patient Admitting Diagnosis: Debility History of Present Illness: Cynthia Ray is an 83 year old right-handed female with history of diastolic congestive heart failure, CAD with stenting, COPD, type 2 diabetes mellitus, right hip fracture May 2019 receiving inpatient rehab services at a skilled nursing facility, PVD and left SFA stenting maintained on Plavix and aspirin as well as 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  Per chart review patient  lives alone in [place].  Used a walker prior to admission and was receiving home health therapies.  Daughter across the street assists as needed.  One level home.  Presented 04/30/2018 with nonhealing ulcers of the left foot.  Recent aortogram showed subtotal occlusion left distal femoral artery as well as occlusion left superficial femoral-popliteal artery.  Underwent endarterectomy of left external iliac common femoral superficial femoral artery with profundoplasty left femoral to anterior tibial artery bypass 05/03/2018 per Dr. Oneida Alar.  Hospital course leukocytosis 12,200, pain management.  Acute on chronic renal insufficiency creatinine baseline 1.25.  Subcutaneous Lovenox for DVT prophylaxis.  Acute blood loss anemia 5.4 transfused with follow-up hemoglobin 10.9 dropping to 8.5 transfused 2 units 05/05/2018.Marland Kitchen  Physical (per discussion with PT, notes pending) and occupational therapy evaluation completed 05/04/2018  with recommendations of physical medicine rehab consult.  Patient is to be admitted for a comprehensive rehab program on 05/05/18.       Past Medical History  Past Medical History:  Diagnosis Date  . Anemia   . Arthritis    "dr said I have it in my back" Lower   . Carotid artery occlusion   . CHF (congestive heart failure) ([place]) 12/2017  . COPD (chronic obstructive pulmonary disease) ([place])   . Fractured hip ([place])    Fell and hurt R hip  . GERD (gastroesophageal reflux disease)   . History of blood transfusion 11/11/11  . Hyperlipidemia   . Hypertension   . PVD (peripheral vascular disease) ([place])    Stents left SFA 2004 Dr. Albertine Patricia  . Type II diabetes mellitus ([place])     Family History  family history includes Diabetes in her brother, mother, and sister; Hyperlipidemia in her sister; Hypertension in her brother, father, mother, and sister; Stroke (age of onset: 66) in her father; Stroke (age of onset: 77) in her mother.  Prior Rehab/Hospitalizations:  Has the patient had  major surgery during 100 days prior to admission? No  Current Medications   Current Facility-Administered Medications:  .  0.9 %  sodium chloride infusion, 500 mL, Intravenous, Once PRN, Theda Sers, Emma M, PA-C .  0.9 %  sodium chloride infusion, , Intravenous, Continuous, Ulyses Amor, [place], Stopped at 05/04/18 3335 .  acetaminophen (TYLENOL) tablet 325-650 mg, 325-650 mg, Oral, Q4H PRN **OR** acetaminophen (TYLENOL) suppository 325-650 mg, 325-650 mg, Rectal, Q4H PRN, Theda Sers, Emma M, PA-C .  alum & mag hydroxide-simeth (MAALOX/MYLANTA) 200-200-20 MG/5ML suspension 15-30 mL, 15-30 mL, Oral, Q2H PRN, Theda Sers, Emma M, PA-C .  amLODipine (NORVASC) tablet 10 mg, 10 mg, Oral, Daily, Laurence Slate M, PA-C, 10 mg at 05/05/18 0943 .  aspirin chewable tablet 81 mg, 81 mg, Oral, Daily, Laurence Slate M, PA-C, 81 mg at 05/05/18 0944 .  atorvastatin (LIPITOR) tablet 80 mg, 80 mg, Oral, Daily, Laurence Slate M, PA-C, 80 mg at 05/05/18 0941 .  bisacodyl (DULCOLAX) EC tablet 5 mg, 5 mg, Oral, Daily PRN, Laurence Slate M, PA-C .  calcium carbonate (TUMS - dosed in mg elemental calcium) chewable tablet 200 mg of elemental calcium, 1 tablet, Oral, Q breakfast, Collins, Emma M, PA-C, 200 mg of elemental calcium at 05/05/18 0941 .  cholecalciferol (VITAMIN D) tablet 1,000 Units, 1,000 Units, Oral, Daily, Ulyses Amor, PA-C, 1,000 Units at 05/05/18 0941 .  docusate sodium (COLACE) capsule 100 mg, 100 mg, Oral, Daily, Laurence Slate M, PA-C, 100 mg at 05/05/18 0944 .  enoxaparin (LOVENOX) injection 30 mg, 30 mg, Subcutaneous, Q24H, Collins, Emma M, PA-C, 30 mg at 05/05/18 0940 .  [START ON 05/06/2018] furosemide (LASIX) tablet 20 mg, 20 mg, Oral, Daily, Purohit, Shrey C, MD .  glipiZIDE (GLUCOTROL) tablet 5 mg, 5 mg, Oral, BID AC, Collins, Emma M, PA-C, 5 mg at 05/05/18 0940 .  guaiFENesin-dextromethorphan (ROBITUSSIN DM) 100-10 MG/5ML syrup 15 mL, 15 mL, Oral, Q4H PRN, Theda Sers, Emma M, PA-C .  hydrALAZINE  (APRESOLINE) injection 5 mg, 5 mg, Intravenous, Q20 Min PRN, Collins, Emma M, PA-C .  insulin aspart (novoLOG) injection 0-15 Units, 0-15 Units, Subcutaneous, TID WC, Collins, Emma M, PA-C, 3 Units at 05/05/18 1208 .  insulin aspart (novoLOG) injection 3 Units, 3 Units, Subcutaneous, TID WC, Alma Friendly, MD, 3 Units at 05/05/18 [phone] .  iron polysaccharides (NIFEREX) capsule 150 mg, 150 mg, Oral, Daily, Collins,  Susette Racer, PA-C, 150 mg at 05/05/18 0941 .  labetalol (NORMODYNE,TRANDATE) injection 10 mg, 10 mg, Intravenous, Q10 min PRN, Theda Sers, Emma M, PA-C .  lactated ringers infusion, , Intravenous, Continuous, Ulyses Amor, PA-C, Last Rate: 10 mL/hr at 05/03/18 1002 .  lisinopril (PRINIVIL,ZESTRIL) tablet 40 mg, 40 mg, Oral, Daily, Laurence Slate M, PA-C, 40 mg at 05/05/18 0944 .  magnesium oxide (MAG-OX) tablet 400 mg, 400 mg, Oral, Daily, Laurence Slate M, PA-C, 400 mg at 05/05/18 0941 .  magnesium sulfate IVPB 2 g 50 mL, 2 g, Intravenous, Daily PRN, Laurence Slate M, PA-C .  metoprolol tartrate (LOPRESSOR) injection 2-5 mg, 2-5 mg, Intravenous, Q2H PRN, Laurence Slate M, PA-C .  metoprolol tartrate (LOPRESSOR) tablet 25 mg, 25 mg, Oral, BID, Laurence Slate M, PA-C, 25 mg at 05/05/18 0943 .  morphine 2 MG/ML injection 0.5-1 mg, 0.5-1 mg, Intravenous, Q2H PRN, Theda Sers, Emma M, PA-C .  nitroGLYCERIN (NITROSTAT) SL tablet 0.4 mg, 0.4 mg, Sublingual, Q5 min PRN, Theda Sers, Emma M, PA-C .  ondansetron [facility]) injection 4 mg, 4 mg, Intravenous, Q6H PRN, Laurence Slate M, PA-C .  oxyCODONE (Oxy IR/ROXICODONE) immediate release tablet 5-10 mg, 5-10 mg, Oral, Q4H PRN, Laurence Slate M, PA-C, 5 mg at 05/05/18 0535 .  pantoprazole (PROTONIX) EC tablet 40 mg, 40 mg, Oral, Q2200, Ulyses Amor, PA-C, 40 mg at 05/04/18 2220 .  phenol (CHLORASEPTIC) mouth spray 1 spray, 1 spray, Mouth/Throat, PRN, Theda Sers, Emma M, PA-C .  polyvinyl alcohol (LIQUIFILM TEARS) 1.4 % ophthalmic solution 1 drop, 1 drop, Both  Eyes, Daily PRN, Theda Sers, Emma M, PA-C .  potassium chloride SA (K-DUR,KLOR-CON) CR tablet 20-40 mEq, 20-40 mEq, Oral, Daily PRN, Theda Sers, Emma M, PA-C .  senna-docusate (Senokot-S) tablet 1 tablet, 1 tablet, Oral, QHS PRN, Laurence Slate M, PA-C .  silver sulfADIAZINE (SILVADENE) 1 % cream 1 application, 1 application, Topical, Daily, Ulyses Amor, PA-C, 1 application at 49/67/59 [phone] .  traMADol (ULTRAM) tablet 50 mg, 50 mg, Oral, BID PRN, Ulyses Amor, PA-C, 50 mg at 05/05/18 0132 .  vitamin C (ASCORBIC ACID) tablet 500 mg, 500 mg, Oral, Daily, Laurence Slate M, PA-C, 500 mg at 05/05/18 4665  Patients Current Diet:  Diet Order            Diet - low sodium heart healthy        Diet heart healthy/carb modified Room service appropriate? Yes; Fluid consistency: Thin  Diet effective now              Precautions / Restrictions Precautions Precautions: Fall Precaution Comments: non-healing bilat foot wounds Other Brace/Splint: post op shoes bilaterally Restrictions Weight Bearing Restrictions: No   Has the patient had 2 or more falls or a fall with injury in the past year?Yes  Prior Activity Level Household: Modified [place] PTA with some assistance for bathing LB and meal prep  Home Assistive Devices / Equipment Home Assistive Devices/Equipment: Eyeglasses, Dentures (specify type), Walker (specify type), Wheelchair, CBG Meter, Other (Comment), Bedside commode/3-in-1, Grab bars in shower, Hand-held shower hose("been sleeping in my recliner") Home Equipment: Walker - 2 wheels, Shower seat, Grab bars - toilet  Prior Device Use: Indicate devices/aids used by the patient prior to current illness, exacerbation or injury? Walker  Prior Functional Level Prior Function Level of [place]: Needs assistance Gait / Transfers Assistance Needed: uses RW ADL's / Homemaking Assistance Needed: Pt reports daughter assists with donning socks and shoes   Self Care: Did the patient  need  help bathing, dressing, using the toilet or eating?  Independent for dressing, toileting, and eating; Mod A for bathing per report.   Indoor Mobility: Did the patient need assistance with walking from room to room (with or without device)? Independent  Stairs: Did the patient need assistance with internal or external stairs (with or without device)? Unknown, pt reports she avoids stairs  Functional Cognition: Did the patient need help planning regular tasks such as shopping or remembering to take medications? Needed some help; pt's daughter would set up pill box.   Current Functional Level Cognition  Overall Cognitive Status: Within Functional Limits for tasks assessed Orientation Level: Oriented X4 Safety/Judgement: Decreased awareness of safety, Decreased awareness of deficits General Comments: Pt with poor awareness of current deficits - pt thinks she will be back to baseline upon return home.  Requires information repeated     Extremity Assessment (includes Sensation/Coordination)  Upper Extremity Assessment: Generalized weakness, LUE deficits/detail LUE Deficits / Details: Pt with limited shoulder ROM   Lower Extremity Assessment: Generalized weakness    ADLs  Overall ADL's : Needs assistance/impaired Eating/Feeding: Independent Grooming: Wash/dry hands, Wash/dry face, Oral care, Brushing hair, Minimal assistance, Standing Upper Body Bathing: Set up, Supervision/ safety, Sitting Lower Body Bathing: Moderate assistance, Sit to/from stand Upper Body Dressing : Supervision/safety, Set up, Sitting Lower Body Dressing: Maximal assistance, Sit to/from stand Toilet Transfer: Moderate assistance, Ambulation, Comfort height toilet, BSC, Grab bars, RW Toilet Transfer Details (indicate cue type and reason): assist to boost up from toilet  Toileting- Clothing Manipulation and Hygiene: Moderate assistance, Sit to/from stand Functional mobility during ADLs: Minimal assistance, Rolling  walker General ADL Comments: DOE 3/4 after ambulating to/from bathroom     Mobility  Overal bed mobility: Needs Assistance Bed Mobility: Supine to Sit Supine to sit: Supervision General bed mobility comments: increased time, increased effort    Transfers  Overall transfer level: Needs assistance Equipment used: Rolling walker (2 wheeled) Transfers: Sit to/from Stand Sit to Stand: Mod assist Stand pivot transfers: Min assist, Min guard General transfer comment: lifting help from EOB and from chair in hallway    Ambulation / Gait / Stairs / Wheelchair Mobility  Ambulation/Gait Ambulation/Gait assistance: Herbalist (Feet): 35 Feet(x 2) Assistive device: Rolling walker (2 wheeled) Gait Pattern/deviations: Step-to pattern, Decreased stride length, Trunk flexed General Gait Details: slow and mildly antalgic, decreased dorsiflexion with post op shoe esp on L Gait velocity: slow Gait velocity interpretation: <1.8 ft/sec, indicate of risk for recurrent falls    Posture / Balance Balance Overall balance assessment: Needs assistance Sitting-balance support: Feet supported, Single extremity supported Sitting balance-Leahy Scale: Good Standing balance support: Bilateral upper extremity supported Standing balance-Leahy Scale: Poor Standing balance comment: requires UE support and external support    Special needs/care consideration BiPAP/CPAP: no CPM: no Continuous Drip IV: 0.9% sodium chloride infusion; lactated ringers infusion  Dialysis: no        Days: no Life Vest: no Oxygen: no Special Bed: no Trach Size: no Wound Vac (area): no      Location: no Skin: Left groin incision, incision left leg x3; Left thigh incision x2, left foot laceration; ulcer to medial and lateral sides of left foot near bases of phalanges; missing big toe on right foot Bowel mgmt:Last BM: 05/04/18 in chart; 05/05/18 per pt. (continent) Bladder mgmt: continent; use of BSC Diabetic mgmt: yes      Previous Home Environment Living Arrangements: Alone Available Help at Discharge: Family, Available 24 hours/day Type of  Home: House Home Layout: One level Home Access: Stairs to enter Entrance Stairs-Rails: None Entrance Stairs-Number of Steps: 1 Bathroom Shower/Tub: Engineer, mining: Yes Home Care Services: Yes Type of Home Care Services: Safety alert, Home RN(wound care)  Discharge Living Setting Plans for Discharge Living Setting: Patient's home, Alone Type of Home at Discharge: House Discharge Home Layout: One level Discharge Home Access: Ramped entrance, Level entry, Other (comment)(pt has small ledge to clear for entrance into house) Discharge Bathroom Shower/Tub: Walk-in shower Discharge Bathroom Toilet: Handicapped height(due to [facility] over commode for elevation) Discharge Bathroom Accessibility: Yes How Accessible: Accessible via walker(use of RW sidestepping) Does the patient have any problems obtaining your medications?: No  Social/Family/Support Systems Patient Roles: Other (Comment)(pt's daughter checks in on her 3x/day; pt cares for cat) Contact Information: (daughter Vivien Rota is emergency contact ) Anticipated Caregiver: Vivien Rota (daugther) and granddaugther Anticipated Ambulance person Information: Vivien Rota: home [phone]) (cell: [phone]) Ability/Limitations of Caregiver: supervision/set up A; with some assist for bathing as she did PTA Caregiver Availability: Intermittent Discharge Plan Discussed with Primary Caregiver: Yes(with pt and her daugther) Is Caregiver In Agreement with Plan?: Yes Does Caregiver/Family have Issues with Lodging/Transportation while Pt is in Rehab?: No   Goals/Additional Needs Patient/Family Goal for Rehab: PT/OT: Mod I/Supervision; SLP: NA Expected length of stay: 5-7 days Cultural Considerations: NA Dietary Needs: Heart Healthy/Carb Modified; thin liquids Equipment Needs:  TBD Pt/Family Agrees to Admission and willing to participate: Yes(Yes, pt and her daugther Vivien Rota) Program Orientation Provided & Reviewed with Pt/Caregiver Including Roles  & Responsibilities: Yes(pt and Vivien Rota)  Barriers to Discharge: Lack of/limited family support, Insurance for SNF coverage(unsure if pt has SNF days left)   Decrease burden of Care through IP rehab admission: Other    Possible need for SNF placement upon discharge: not anticipated; Pt has family that lives across the street with daughter and granddaughter that can assist intermittently at home. Pt has good prognosis for further progress and return to PLOF.    Patient Condition: This patient's condition remains as documented in the consult dated 05/04/18, in which the Rehabilitation Physician determined and documented that the patient's condition is potentially appropriate for intensive rehabilitative care in an inpatient rehabilitation facility pending completion of therapy evals to determine functional needs; Since evaluations completed, needs have been established as pt is not at baseline functioning since surgery. Pt is requiring Min A for transfers and is requiring Mod/Max A for ADLs. These areas have been addressed and pt is willing to come to CIR to address deficits in order to return home alone with intermittent assist. Will admit to inpatient rehab today 05/05/18.   Preadmission Screen Completed By:  Jhonnie Garner, 05/05/2018 2:28 PM ______________________________________________________________________   Discussed status with Dr. Posey Pronto on 05/05/18 at 3:34PM and received telephone approval for admission today.  Admission Coordinator:  Jhonnie Garner, time 3:34PM/Date 05/05/18

## 2018-05-05 NOTE — Care Management Note (Signed)
Case Management Note Marvetta Gibbons RN, BSN Transitions of Care Unit 4E- RN Case Manager [phone]  Patient Details  Name: Cynthia Ray MRN: [id number] Date of Birth: [birth year]  Subjective/Objective:  Pt admitted s/p bypass graft                   Action/Plan: PTA pt lived at home alone, with daughter and grandchildren checking in on her, has RW and 3n1 at home. PT eval ordered with recommendation for HHPT and initial 24hr supervision. CM to follow for transition of care needs.  Expected Discharge Date:  05/05/18               Expected Discharge Plan:  [place]  In-House Referral:     Discharge planning Services  CM Consult  Post Acute Care Choice:  Home Health, IP Rehab Choice offered to:  Patient, Adult Children  DME Arranged:    DME Agency:     HH Arranged:    [place] Agency:     Status of Service:  Completed, signed off  If discussed at [employer] of Stay Meetings, dates discussed:    Discharge Disposition: IP rehab   Additional Comments:  05/05/18- 41- Zalayah Pizzuto RN, CM-  CIR was consulted for possible admission- as daughter is limited in assistance she can provide at this time. Pt is agreeable after speaking with daughter to IP rehab admission. Per Claiborne Billings with [facility] IP rehab they are able to offer a bed today and admit. Pt to transition to CIR later today.   Dawayne Patricia, RN 05/05/2018, 2:24 PM

## 2018-05-05 NOTE — Progress Notes (Signed)
Pt discharged to [facility]. Report was given to receiving nurse, Hillary. All questions were answered. Pt left with all belongings. Pt discharged via wheelchair and was accompanied by this RN and pt's NT.    Ara Kussmaul BSN, RN

## 2018-05-05 NOTE — Progress Notes (Signed)
PMR Admission Coordinator Pre-Admission Assessment  Patient: Cynthia Ray is an 84 y.o., female MRN: [id number] DOB: September 22, 1925 Height: 5' (152.4 cm) Weight: 57.6 kg                                                                                                                                                  Insurance Information HMO:     PPO:      PCP:      IPA:      80/20: Yes     OTHER:  PRIMARY: Medicare Part A and B      Policy#: [id number]      Subscriber: Patient CM Name:       Phone#:      Fax#:  Pre-Cert#:       Employer:  Benefits:  Phone #: NA     Name: Verified eligibility online via [employer] on 05/04/18 Eff. Date: part A effective: 05/28/93; Part B effective: 08/29/95     Deduct: $1,364      Out of Pocket Max: NA      Life Max: NA CIR: Covered per Medicare guidelines once yearly deductible is met      SNF: days 1-20, 100%; days 21-100, 80%  Outpatient: 80%     Co-Pay: 20% Home Health: 100%      Co-Pay:  DME: 80%     Co-Pay: 20% Providers: Pt's choice SECONDARY: Generic Insurance       Policy#: [id number]      Subscriber: Patient CM Name:       Phone#:      Fax#:  Pre-Cert#:      Employer:  Benefits:  Phone #: [phone]     Name:  Eff. Date:      Deduct:       Out of Pocket Max:       Life Max:  CIR:       SNF:  Outpatient:      Co-Pay:  Home Health:       Co-Pay:  DME:      Co-Pay:   Medicaid Application Date:       Case Manager:  Disability Application Date:       Case Worker:   Emergency Contact Information         Contact Information    Name Relation Home Work Mobile   Fain,Toni Daughter [phone]       Current Medical History  Patient Admitting Diagnosis: Debility History of Present Illness: Cynthia Coxis an 84 year old right-handed female with history of diastolic congestive heart failure, CAD with stenting, COPD, type 2 diabetes mellitus, right hip fracture May 2019 receiving inpatient rehab services at a skilled nursing facility,  PVD and left SFA stenting maintained on Plavix and aspirin as well as 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 Per chart review patient lives  alone in [facility]. Used a walker prior to admission and was receiving home health therapies. Daughter across the street assists as needed. One level home. Presented 04/30/2018 with nonhealing ulcers of the left foot. Recent aortogram showed subtotal occlusion left distal femoral artery as well as occlusion left superficial femoral-popliteal artery. Underwent endarterectomy of left external iliac common femoral superficial femoral artery with profundoplasty left femoral to anterior tibial artery bypass 05/03/2018 per Dr. Oneida Alar. Hospital course leukocytosis 12,200, pain management.Acute on chronic renal insufficiency creatinine baseline 1.25. Subcutaneous Lovenox for DVT prophylaxis. Acute blood loss anemia 5.4 transfused with follow-up hemoglobin 10.9 dropping to 8.5 transfused 2 units 05/05/2018.Marland Kitchen Physical (per discussion with PT, notes pending) and occupational therapy evaluation completed 05/04/2018 with recommendations of physical medicine rehab consult. Patient is to be admitted for a comprehensive rehab program on 05/05/18.   Past Medical History      Past Medical History:  Diagnosis Date  . Anemia   . Arthritis    "dr said I have it in my back" Lower   . Carotid artery occlusion   . CHF (congestive heart failure) ([place]) 12/2017  . COPD (chronic obstructive pulmonary disease) ([place])   . Fractured hip ([place])    Fell and hurt R hip  . GERD (gastroesophageal reflux disease)   . History of blood transfusion 11/11/11  . Hyperlipidemia   . Hypertension   . PVD (peripheral vascular disease) ([place])    Stents left SFA 2004 Dr. Albertine Patricia  . Type II diabetes mellitus ([place])     Family History  family history includes Diabetes in her brother, mother, and sister; Hyperlipidemia in her sister; Hypertension in her brother,  father, mother, and sister; Stroke (age of onset: 20) in her father; Stroke (age of onset: 85) in her mother.  Prior Rehab/Hospitalizations:  Has the patient had major surgery during 100 days prior to admission? No  Current Medications   Current Facility-Administered Medications:  .  0.9 %  sodium chloride infusion, 500 mL, Intravenous, Once PRN, Theda Sers, Emma M, PA-C .  0.9 %  sodium chloride infusion, , Intravenous, Continuous, Ulyses Amor, [place], Stopped at 05/04/18 3790 .  acetaminophen (TYLENOL) tablet 325-650 mg, 325-650 mg, Oral, Q4H PRN **OR** acetaminophen (TYLENOL) suppository 325-650 mg, 325-650 mg, Rectal, Q4H PRN, Theda Sers, Emma M, PA-C .  alum & mag hydroxide-simeth (MAALOX/MYLANTA) 200-200-20 MG/5ML suspension 15-30 mL, 15-30 mL, Oral, Q2H PRN, Theda Sers, Emma M, PA-C .  amLODipine (NORVASC) tablet 10 mg, 10 mg, Oral, Daily, Laurence Slate M, PA-C, 10 mg at 05/05/18 0943 .  aspirin chewable tablet 81 mg, 81 mg, Oral, Daily, Laurence Slate M, PA-C, 81 mg at 05/05/18 0944 .  atorvastatin (LIPITOR) tablet 80 mg, 80 mg, Oral, Daily, Laurence Slate M, PA-C, 80 mg at 05/05/18 0941 .  bisacodyl (DULCOLAX) EC tablet 5 mg, 5 mg, Oral, Daily PRN, Laurence Slate M, PA-C .  calcium carbonate (TUMS - dosed in mg elemental calcium) chewable tablet 200 mg of elemental calcium, 1 tablet, Oral, Q breakfast, Collins, Emma M, PA-C, 200 mg of elemental calcium at 05/05/18 0941 .  cholecalciferol (VITAMIN D) tablet 1,000 Units, 1,000 Units, Oral, Daily, Ulyses Amor, PA-C, 1,000 Units at 05/05/18 0941 .  docusate sodium (COLACE) capsule 100 mg, 100 mg, Oral, Daily, Laurence Slate M, PA-C, 100 mg at 05/05/18 0944 .  enoxaparin (LOVENOX) injection 30 mg, 30 mg, Subcutaneous, Q24H, Collins, Emma M, PA-C, 30 mg at 05/05/18 0940 .  [START ON 05/06/2018] furosemide (LASIX) tablet 20  mg, 20 mg, Oral, Daily, Purohit, Shrey C, MD .  glipiZIDE (GLUCOTROL) tablet 5 mg, 5 mg, Oral, BID AC, Collins, Emma M, PA-C,  5 mg at 05/05/18 0940 .  guaiFENesin-dextromethorphan (ROBITUSSIN DM) 100-10 MG/5ML syrup 15 mL, 15 mL, Oral, Q4H PRN, Theda Sers, Emma M, PA-C .  hydrALAZINE (APRESOLINE) injection 5 mg, 5 mg, Intravenous, Q20 Min PRN, Collins, Emma M, PA-C .  insulin aspart (novoLOG) injection 0-15 Units, 0-15 Units, Subcutaneous, TID WC, Collins, Emma M, PA-C, 3 Units at 05/05/18 1208 .  insulin aspart (novoLOG) injection 3 Units, 3 Units, Subcutaneous, TID WC, Alma Friendly, MD, 3 Units at 05/05/18 [phone] .  iron polysaccharides (NIFEREX) capsule 150 mg, 150 mg, Oral, Daily, Laurence Slate M, PA-C, 150 mg at 05/05/18 0941 .  labetalol (NORMODYNE,TRANDATE) injection 10 mg, 10 mg, Intravenous, Q10 min PRN, Theda Sers, Emma M, PA-C .  lactated ringers infusion, , Intravenous, Continuous, Ulyses Amor, PA-C, Last Rate: 10 mL/hr at 05/03/18 1002 .  lisinopril (PRINIVIL,ZESTRIL) tablet 40 mg, 40 mg, Oral, Daily, Laurence Slate M, PA-C, 40 mg at 05/05/18 0944 .  magnesium oxide (MAG-OX) tablet 400 mg, 400 mg, Oral, Daily, Laurence Slate M, PA-C, 400 mg at 05/05/18 0941 .  magnesium sulfate IVPB 2 g 50 mL, 2 g, Intravenous, Daily PRN, Laurence Slate M, PA-C .  metoprolol tartrate (LOPRESSOR) injection 2-5 mg, 2-5 mg, Intravenous, Q2H PRN, Laurence Slate M, PA-C .  metoprolol tartrate (LOPRESSOR) tablet 25 mg, 25 mg, Oral, BID, Laurence Slate M, PA-C, 25 mg at 05/05/18 0943 .  morphine 2 MG/ML injection 0.5-1 mg, 0.5-1 mg, Intravenous, Q2H PRN, Theda Sers, Emma M, PA-C .  nitroGLYCERIN (NITROSTAT) SL tablet 0.4 mg, 0.4 mg, Sublingual, Q5 min PRN, Theda Sers, Emma M, PA-C .  ondansetron [facility]) injection 4 mg, 4 mg, Intravenous, Q6H PRN, Laurence Slate M, PA-C .  oxyCODONE (Oxy IR/ROXICODONE) immediate release tablet 5-10 mg, 5-10 mg, Oral, Q4H PRN, Laurence Slate M, PA-C, 5 mg at 05/05/18 0535 .  pantoprazole (PROTONIX) EC tablet 40 mg, 40 mg, Oral, Q2200, Ulyses Amor, PA-C, 40 mg at 05/04/18 2220 .  phenol (CHLORASEPTIC)  mouth spray 1 spray, 1 spray, Mouth/Throat, PRN, Theda Sers, Emma M, PA-C .  polyvinyl alcohol (LIQUIFILM TEARS) 1.4 % ophthalmic solution 1 drop, 1 drop, Both Eyes, Daily PRN, Theda Sers, Emma M, PA-C .  potassium chloride SA (K-DUR,KLOR-CON) CR tablet 20-40 mEq, 20-40 mEq, Oral, Daily PRN, Theda Sers, Emma M, PA-C .  senna-docusate (Senokot-S) tablet 1 tablet, 1 tablet, Oral, QHS PRN, Laurence Slate M, PA-C .  silver sulfADIAZINE (SILVADENE) 1 % cream 1 application, 1 application, Topical, Daily, Ulyses Amor, PA-C, 1 application at 96/04/54 [phone] .  traMADol (ULTRAM) tablet 50 mg, 50 mg, Oral, BID PRN, Ulyses Amor, PA-C, 50 mg at 05/05/18 0132 .  vitamin C (ASCORBIC ACID) tablet 500 mg, 500 mg, Oral, Daily, Laurence Slate M, PA-C, 500 mg at 05/05/18 1914  Patients Current Diet:     Diet Order                  Diet - low sodium heart healthy         Diet heart healthy/carb modified Room service appropriate? Yes; Fluid consistency: Thin  Diet effective now               Precautions / Restrictions Precautions Precautions: Fall Precaution Comments: non-healing bilat foot wounds Other Brace/Splint: post op shoes bilaterally Restrictions Weight Bearing Restrictions: No   Has the patient had  2 or more falls or a fall with injury in the past year?Yes  Prior Activity Level Household: Modified [place] PTA with some assistance for bathing LB and meal prep  Home Assistive Devices / Equipment Home Assistive Devices/Equipment: Eyeglasses, Dentures (specify type), Walker (specify type), Wheelchair, CBG Meter, Other (Comment), Bedside commode/3-in-1, Grab bars in shower, Hand-held shower hose("been sleeping in my recliner") Home Equipment: Walker - 2 wheels, Shower seat, Grab bars - toilet  Prior Device Use: Indicate devices/aids used by the patient prior to current illness, exacerbation or injury? Walker  Prior Functional Level Prior Function Level of [place]:  Needs assistance Gait / Transfers Assistance Needed: uses RW ADL's / Homemaking Assistance Needed: Pt reports daughter assists with donning socks and shoes   Self Care: Did the patient need help bathing, dressing, using the toilet or eating?  Independent for dressing, toileting, and eating; Mod A for bathing per report.   Indoor Mobility: Did the patient need assistance with walking from room to room (with or without device)? Independent  Stairs: Did the patient need assistance with internal or external stairs (with or without device)? Unknown, pt reports she avoids stairs  Functional Cognition: Did the patient need help planning regular tasks such as shopping or remembering to take medications? Needed some help; pt's daughter would set up pill box.   Current Functional Level Cognition  Overall Cognitive Status: Within Functional Limits for tasks assessed Orientation Level: Oriented X4 Safety/Judgement: Decreased awareness of safety, Decreased awareness of deficits General Comments: Pt with poor awareness of current deficits - pt thinks she will be back to baseline upon return home.  Requires information repeated     Extremity Assessment (includes Sensation/Coordination)  Upper Extremity Assessment: Generalized weakness, LUE deficits/detail LUE Deficits / Details: Pt with limited shoulder ROM   Lower Extremity Assessment: Generalized weakness    ADLs  Overall ADL's : Needs assistance/impaired Eating/Feeding: Independent Grooming: Wash/dry hands, Wash/dry face, Oral care, Brushing hair, Minimal assistance, Standing Upper Body Bathing: Set up, Supervision/ safety, Sitting Lower Body Bathing: Moderate assistance, Sit to/from stand Upper Body Dressing : Supervision/safety, Set up, Sitting Lower Body Dressing: Maximal assistance, Sit to/from stand Toilet Transfer: Moderate assistance, Ambulation, Comfort height toilet, BSC, Grab bars, RW Toilet Transfer Details (indicate cue  type and reason): assist to boost up from toilet  Toileting- Clothing Manipulation and Hygiene: Moderate assistance, Sit to/from stand Functional mobility during ADLs: Minimal assistance, Rolling walker General ADL Comments: DOE 3/4 after ambulating to/from bathroom     Mobility  Overal bed mobility: Needs Assistance Bed Mobility: Supine to Sit Supine to sit: Supervision General bed mobility comments: increased time, increased effort    Transfers  Overall transfer level: Needs assistance Equipment used: Rolling walker (2 wheeled) Transfers: Sit to/from Stand Sit to Stand: Mod assist Stand pivot transfers: Min assist, Min guard General transfer comment: lifting help from EOB and from chair in hallway    Ambulation / Gait / Stairs / Wheelchair Mobility  Ambulation/Gait Ambulation/Gait assistance: Herbalist (Feet): 35 Feet(x 2) Assistive device: Rolling walker (2 wheeled) Gait Pattern/deviations: Step-to pattern, Decreased stride length, Trunk flexed General Gait Details: slow and mildly antalgic, decreased dorsiflexion with post op shoe esp on L Gait velocity: slow Gait velocity interpretation: <1.8 ft/sec, indicate of risk for recurrent falls    Posture / Balance Balance Overall balance assessment: Needs assistance Sitting-balance support: Feet supported, Single extremity supported Sitting balance-Leahy Scale: Good Standing balance support: Bilateral upper extremity supported Standing balance-Leahy Scale: Poor Standing balance  comment: requires UE support and external support    Special needs/care consideration BiPAP/CPAP: no CPM: no Continuous Drip IV: 0.9% sodium chloride infusion; lactated ringers infusion  Dialysis: no        Days: no Life Vest: no Oxygen: no Special Bed: no Trach Size: no Wound Vac (area): no      Location: no Skin: Left groin incision, incision left leg x3; Left thigh incision x2, left foot laceration; ulcer to medial and  lateral sides of left foot near bases of phalanges; missing big toe on right foot Bowel mgmt:Last BM: 05/04/18 in chart; 05/05/18 per pt. (continent) Bladder mgmt: continent; use of BSC Diabetic mgmt: yes     Previous Home Environment Living Arrangements: Alone Available Help at Discharge: Family, Available 24 hours/day Type of Home: House Home Layout: One level Home Access: Stairs to enter Entrance Stairs-Rails: None Technical brewer of Steps: 1 Bathroom Shower/Tub: Multimedia programmer: Programmer, systems: Yes Home Care Services: Yes Type of Home Care Services: Safety alert, Home RN(wound care)  Discharge Living Setting Plans for Discharge Living Setting: Patient's home, Alone Type of Home at Discharge: House Discharge Home Layout: One level Discharge Home Access: Ramped entrance, Level entry, Other (comment)(pt has small ledge to clear for entrance into house) Discharge Bathroom Shower/Tub: Walk-in shower Discharge Bathroom Toilet: Handicapped height(due to [facility] over commode for elevation) Discharge Bathroom Accessibility: Yes How Accessible: Accessible via walker(use of RW sidestepping) Does the patient have any problems obtaining your medications?: No  Social/Family/Support Systems Patient Roles: Other (Comment)(pt's daughter checks in on her 3x/day; pt cares for cat) Contact Information: (daughter Vivien Rota is emergency contact ) Anticipated Caregiver: Vivien Rota (daugther) and granddaugther Anticipated Ambulance person Information: Vivien Rota: home [phone]) (cell: [phone]) Ability/Limitations of Caregiver: supervision/set up A; with some assist for bathing as she did PTA Caregiver Availability: Intermittent Discharge Plan Discussed with Primary Caregiver: Yes(with pt and her daugther) Is Caregiver In Agreement with Plan?: Yes Does Caregiver/Family have Issues with Lodging/Transportation while Pt is in Rehab?: No   Goals/Additional  Needs Patient/Family Goal for Rehab: PT/OT: Mod I/Supervision; SLP: NA Expected length of stay: 5-7 days Cultural Considerations: NA Dietary Needs: Heart Healthy/Carb Modified; thin liquids Equipment Needs: TBD Pt/Family Agrees to Admission and willing to participate: Yes(Yes, pt and her daugther Vivien Rota) Program Orientation Provided & Reviewed with Pt/Caregiver Including Roles  & Responsibilities: Yes(pt and Vivien Rota)  Barriers to Discharge: Lack of/limited family support, Insurance for SNF coverage(unsure if pt has SNF days left)   Decrease burden of Care through IP rehab admission: Other    Possible need for SNF placement upon discharge: not anticipated; Pt has family that lives across the street with daughter and granddaughter that can assist intermittently at home. Pt has good prognosis for further progress and return to PLOF.    Patient Condition: This patient's condition remains as documented in the consult dated 05/04/18, in which the Rehabilitation Physician determined and documented that the patient's condition is potentially appropriate for intensive rehabilitative care in an inpatient rehabilitation facility pending completion of therapy evals to determine functional needs; Since evaluations completed, needs have been established as pt is not at baseline functioning since surgery. Pt is requiring Min A for transfers and is requiring Mod/Max A for ADLs. These areas have been addressed and pt is willing to come to CIR to address deficits in order to return home alone with intermittent assist. Will admit to inpatient rehab today 05/05/18.   Preadmission Screen Completed By:  Jhonnie Garner, 05/05/2018 2:28 PM  ______________________________________________________________________   Discussed status with Dr. Posey Pronto on 05/05/18 at 3:34PM and received telephone approval for admission today.  Admission Coordinator:  Jhonnie Garner, time 3:34PM/Date 05/05/18       Revision History    Date/Time  User Provider Type Action  05/05/2018 4:17 PM Jamse Arn, MD Physician Addend  05/05/2018 4:17 PM Jamse Arn, MD Physician Cosign  05/05/2018 3:40 PM Jhonnie Garner, [facility] Admission Coordinator Sign  View Details Report

## 2018-05-05 NOTE — Progress Notes (Signed)
Patient arrived and settled into room, oriented to unit, verbalized understanding for the need to call for assistance

## 2018-05-05 NOTE — Progress Notes (Signed)
First unit of blood started. Vitals stable. Pt resting comfortably in bed with no complaints. Will continue to monitor.   Ara Kussmaul BSN, RN

## 2018-05-05 NOTE — H&P (Signed)
Physical Medicine and Rehabilitation Admission H&P     HPI: Cynthia Ray is an 84 year old right-handed female with history of diastolic congestive heart failure, CAD with stenting, COPD, type 2 diabetes mellitus, right hip fracture May 2019 receiving inpatient rehab services at a skilled nursing facility, PVD and left SFA stenting maintained on Plavix and aspirin as well as 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  Per chart review and patient, patient lives alone in [place].  Used a walker prior to admission and was receiving home health therapies.  Daughter across the street assists can provide intermittent assistance. One level home.  Presented 04/30/2018 with nonhealing ulcers of the left foot.  Recent aortogram showed subtotal occlusion left distal femoral artery as well as occlusion left superficial femoral-popliteal artery.  Underwent endarterectomy of left external iliac common femoral superficial femoral artery with profundoplasty left femoral to anterior tibial artery bypass 05/03/2018 per Dr. Oneida Alar.  Hospital course leukocytosis 12,200, pain management.  Acute on chronic renal insufficiency creatinine baseline 1.25.  Subcutaneous Lovenox for DVT prophylaxis.  Acute blood loss anemia 5.4 transfused with follow-up hemoglobin 10.9 dropping to 8.5 transfused 2 units 05/05/2018.Marland Kitchen  Physical therapy evaluation completed 05/04/2018 with recommendations of physical medicine rehab consult.  Patient was admitted for a comprehensive rehab program.  Review of Systems  Constitutional: Positive for malaise/fatigue. Negative for chills and fever.  HENT: Negative for hearing loss.   Eyes: Negative for blurred vision and double vision.  Respiratory: Positive for shortness of breath.   Cardiovascular: Positive for leg swelling.  Gastrointestinal: Positive for constipation. Negative for nausea and vomiting.       GERD  Genitourinary: Negative for dysuria, flank pain and  hematuria.  Musculoskeletal: Positive for myalgias.  Skin: Negative for rash.  Neurological: Positive for weakness.  All other systems reviewed and are negative.  Past Medical History:  Diagnosis Date  . Anemia   . Arthritis    "dr said I have it in my back" Lower   . Carotid artery occlusion   . CHF (congestive heart failure) ([place]) 12/2017  . COPD (chronic obstructive pulmonary disease) ([place])   . Fractured hip ([place])    Fell and hurt R hip  . GERD (gastroesophageal reflux disease)   . History of blood transfusion 11/11/11  . Hyperlipidemia   . Hypertension   . PVD (peripheral vascular disease) ([place])    Stents left SFA 2004 Dr. Albertine Patricia  . Type II diabetes mellitus ([place])    Past Surgical History:  Procedure Laterality Date  . ABDOMINAL AORTAGRAM N/A 10/31/2011   Procedure: ABDOMINAL Maxcine Ham;  Surgeon: Elam Dutch, MD;  Location: [facility] CATH LAB;  Service: Cardiovascular;  Laterality: N/A;  . ABDOMINAL AORTOGRAM W/LOWER EXTREMITY N/A 04/16/2018   Procedure: ABDOMINAL AORTOGRAM W/LOWER EXTREMITY;  Surgeon: Elam Dutch, MD;  Location: [facility] CV LAB;  Service: Cardiovascular;  Laterality: N/A;  . AMPUTATION  03/23/2012   Procedure: AMPUTATION DIGIT;  Surgeon: Elam Dutch, MD;  Location: [facility] OR;  Service: Vascular;  Laterality: Right;  Right Femoral endarterectomy with profundaplasty; right femoral-popliteal bypass with nonreversed saphenous vein; and right first toe amputation  . CATARACT EXTRACTION W/ INTRAOCULAR LENS  IMPLANT, BILATERAL  2002  . COLONOSCOPY    . CORONARY ANGIOPLASTY WITH STENT PLACEMENT  01/19/12   "1; first one I've ever had"  . ENDARTERECTOMY FEMORAL Left 05/03/2018   Procedure: LEFT FEMORAL ENDARTERECTOMY WITH PROFUNDAPLASTY;  Surgeon: Elam Dutch, MD;  Location: [facility]  OR;  Service: Vascular;  Laterality: Left;  . EYE SURGERY  2009   Laser bilateral   . FEMORAL ENDARTERECTOMY  03/23/2012   right  . FEMORAL-POPLITEAL BYPASS GRAFT  03/23/2012    Procedure: BYPASS GRAFT FEMORAL-POPLITEAL ARTERY;  Surgeon: Elam Dutch, MD;  Location: [facility] OR;  Service: Vascular;  Laterality: Right;  Right Femoral endarterectomy with profundaplasty; right femoral-popliteal bypass with nonreversed saphenous vein; and right first toe amputation  . FEMORAL-TIBIAL BYPASS GRAFT Left 05/03/2018   Procedure: LEFT  FEMORAL TO ANTERIOR TIBIAL ARTERY BYPASS WITH COMPOSITE PTFE GRAFT AND REVERSED LEFT SAPHENOUS VEIN GRAFT.;  Surgeon: Elam Dutch, MD;  Location: [facility];  Service: Vascular;  Laterality: Left;  . HIP FRACTURE SURGERY Right 11/2017  . PATCH ANGIOPLASTY Left 05/03/2018   Procedure: PATCH ANGIOPLASTY LEFT FEMORAL ARTERY;  Surgeon: Elam Dutch, MD;  Location: [facility];  Service: Vascular;  Laterality: Left;  . PERCUTANEOUS CORONARY STENT INTERVENTION (PCI-S) N/A 01/19/2012   Procedure: PERCUTANEOUS CORONARY STENT INTERVENTION (PCI-S);  Surgeon: Peter M Martinique, MD;  Location[facility] CATH LAB;  Service: Cardiovascular;  Laterality: N/A;  . PERIPHERAL ARTERIAL STENT GRAFT  2003   LLE  . POSTERIOR LAMINECTOMY / DECOMPRESSION LUMBAR SPINE  1989  . REFRACTIVE SURGERY  2009   bilaterally  . TONSILLECTOMY AND ADENOIDECTOMY  1946   Family History  Problem Relation Age of Onset  . Stroke Father 81  . Hypertension Father   . Stroke Mother 32  . Diabetes Mother   . Hypertension Mother   . Diabetes Sister   . Hyperlipidemia Sister   . Hypertension Sister   . Diabetes Brother   . Hypertension Brother    Social History:  reports that she quit smoking about 30 years ago. Her smoking use included cigarettes. She started smoking about 54 years ago. She has a 24.00 pack-year smoking history. She has never used smokeless tobacco. She reports that she does not drink alcohol or use drugs. Allergies:  Allergies  Allergen Reactions  . Ibuprofen Other (See Comments)    Dr. Britta Mccreedy informed patient not to take this due to ulcer    Medications Prior to Admission    Medication Sig Dispense Refill  . acetaminophen (TYLENOL) 650 MG CR tablet Take 1,300 mg by mouth every 8 (eight) hours as needed for pain.    Marland Kitchen amLODipine (NORVASC) 10 MG tablet TAKE ONE TABLET BY MOUTH DAILY. (Patient taking differently: Take 10 mg by mouth daily. ) 90 tablet 1  . aspirin 81 MG chewable tablet Chew 81 mg by mouth daily.     Marland Kitchen atorvastatin (LIPITOR) 80 MG tablet Take 80 mg by mouth daily.    . calcium carbonate (TUMS - DOSED IN MG ELEMENTAL CALCIUM) 500 MG chewable tablet Chew 1 tablet by mouth daily.    . cholecalciferol (VITAMIN D) 1000 UNITS tablet Take 1,000 Units by mouth daily.    . clopidogrel (PLAVIX) 75 MG tablet TAKE ONE TABLET BY MOUTH DAILY. (Patient taking differently: Take 75 mg by mouth daily. ) 90 tablet 1  . furosemide (LASIX) 20 MG tablet Take 20 mg by mouth daily.  0  . glipiZIDE (GLUCOTROL) 5 MG tablet Take 5 mg by mouth 2 (two) times daily before a meal.    . iron polysaccharides (NIFEREX) 150 MG capsule Take 150 mg by mouth daily.     Marland Kitchen lisinopril (PRINIVIL,ZESTRIL) 40 MG tablet Take 1 tablet (40 mg total) by mouth daily. 30 tablet 6  . magnesium oxide (MAG-OX)  400 MG tablet Take 400 mg by mouth daily.    . metFORMIN (GLUCOPHAGE) 1000 MG tablet Take 1,000 mg by mouth 2 (two) times daily with a meal.     . metoprolol tartrate (LOPRESSOR) 25 MG tablet Take 1 tablet (25 mg total) by mouth 2 (two) times daily. 180 tablet 3  . nitroGLYCERIN (NITROSTAT) 0.4 MG SL tablet Place 0.4 mg under the tongue every 5 (five) minutes as needed for chest pain.    Vladimir Faster Glycol-Propyl Glycol (SYSTANE) 0.4-0.3 % SOLN Place 1 drop into both eyes daily as needed (for dry eyes).    . silver sulfADIAZINE (SILVADENE) 1 % cream Apply 1 application topically daily.    . traMADol (ULTRAM) 50 MG tablet Take 50 mg by mouth 2 (two) times daily as needed for moderate pain.     . vitamin C (ASCORBIC ACID) 500 MG tablet Take 500 mg by mouth daily.       Drug Regimen Review Drug  regimen was reviewed and remains appropriate with no significant issues identified  Home: Home Living Family/patient expects to be discharged to:: Private residence Living Arrangements: Alone Available Help at Discharge: Family, Available 24 hours/day Type of Home: House Home Access: Stairs to enter [employer] of Steps: 1 Entrance Stairs-Rails: None Home Layout: One level Bathroom Shower/Tub: Multimedia programmer: Standard Bathroom Accessibility: Yes Home Equipment: Environmental consultant - 2 wheels, Shower seat, Grab bars - toilet   Functional History: Prior Function Level of Independence: Needs assistance Gait / Transfers Assistance Needed: uses RW ADL's / Homemaking Assistance Needed: Pt reports daughter assists with donning socks and shoes   Functional Status:  Mobility: Bed Mobility Overal bed mobility: Needs Assistance Bed Mobility: Supine to Sit Supine to sit: Supervision General bed mobility comments: increased time, increased effort Transfers Overall transfer level: Needs assistance Equipment used: Rolling walker (2 wheeled) Transfers: Sit to/from Stand Sit to Stand: Mod assist Stand pivot transfers: Min assist, Min guard General transfer comment: lifting help from EOB and from chair in hallway Ambulation/Gait Ambulation/Gait assistance: Min assist Gait Distance (Feet): 35 Feet(x 2) Assistive device: Rolling walker (2 wheeled) Gait Pattern/deviations: Step-to pattern, Decreased stride length, Trunk flexed General Gait Details: slow and mildly antalgic, decreased dorsiflexion with post op shoe esp on L Gait velocity: slow Gait velocity interpretation: <1.8 ft/sec, indicate of risk for recurrent falls    ADL: ADL Overall ADL's : Needs assistance/impaired Eating/Feeding: Independent Grooming: Wash/dry hands, Wash/dry face, Oral care, Brushing hair, Minimal assistance, Standing Upper Body Bathing: Set up, Supervision/ safety, Sitting Lower Body Bathing:  Moderate assistance, Sit to/from stand Upper Body Dressing : Supervision/safety, Set up, Sitting Lower Body Dressing: Maximal assistance, Sit to/from stand Toilet Transfer: Moderate assistance, Ambulation, Comfort height toilet, BSC, Grab bars, RW Toilet Transfer Details (indicate cue type and reason): assist to boost up from toilet  Toileting- Clothing Manipulation and Hygiene: Moderate assistance, Sit to/from stand Functional mobility during ADLs: Minimal assistance, Rolling walker General ADL Comments: DOE 3/4 after ambulating to/from bathroom   Cognition: Cognition Overall Cognitive Status: Within Functional Limits for tasks assessed Orientation Level: Oriented X4 Cognition Arousal/Alertness: Awake/alert Behavior During Therapy: WFL for tasks assessed/performed Overall Cognitive Status: Within Functional Limits for tasks assessed Area of Impairment: Memory, Safety/judgement, Problem solving Memory: Decreased short-term memory Safety/Judgement: Decreased awareness of safety, Decreased awareness of deficits Problem Solving: Difficulty sequencing, Requires verbal cues General Comments: Pt with poor awareness of current deficits - pt thinks she will be back to baseline upon return home.  Requires information repeated   Physical Exam: Blood pressure (!) 126/49, pulse 94, temperature 98.4 F (36.9 C), temperature source Oral, resp. rate 19, height 5' (1.524 m), weight 57.6 kg, SpO2 95 %. Physical Exam  Constitutional: She appears well-developed and well-nourished.  HENT:  Head: Normocephalic and atraumatic.  Eyes: EOM are normal. Right eye exhibits no discharge. Left eye exhibits no discharge.  Neck: Normal range of motion. Neck supple. No thyromegaly present.  Cardiovascular: Normal rate, regular rhythm and normal heart sounds.  Respiratory: Effort normal and breath sounds normal. No respiratory distress.  GI: Soft. Bowel sounds are normal. She exhibits no distension.    Musculoskeletal:  LLE edema and tenderness  Neurological: She is alert.  Patient alert.   No acute distress.   Follows full commands. Oriented to person place and time.   Motor: [place]: Shoulder abduction 2+/5, distally 4+5 Right upper extremity: 5 proximal distal Left lower extremity: Flexion, knee extension and ankle dorsiflexion 2/5 Right lower extremity: Hip flexion, knee extension 4 medicine/5, ankle dorsiflexion 4/5  Skin:  Incision site clean and dry.  History of right great toe amputation well-healed  Psychiatric: She has a normal mood and affect. Her behavior is normal.    Results for orders placed or performed during the hospital encounter of 04/30/18 (from the past 48 hour(s))  Glucose, capillary     Status: Abnormal   Collection Time: 05/03/18  9:03 PM  Result Value Ref Range   Glucose-Capillary 284 (H) 70 - 99 mg/dL  Basic metabolic panel     Status: Abnormal   Collection Time: 05/04/18  3:42 AM  Result Value Ref Range   Sodium 136 135 - 145 mmol/L   Potassium 5.0 3.5 - 5.1 mmol/L    Comment: NO VISIBLE HEMOLYSIS   Chloride 103 98 - 111 mmol/L   CO2 24 22 - 32 mmol/L   Glucose, Bld 252 (H) 70 - 99 mg/dL   BUN 17 8 - 23 mg/dL   Creatinine, Ser 1.16 (H) 0.44 - 1.00 mg/dL   Calcium 7.6 (L) 8.9 - 10.3 mg/dL   GFR calc non Af Amer 40 (L) >60 mL/min   GFR calc Af Amer 47 (L) >60 mL/min    Comment: (NOTE) The eGFR has been calculated using the CKD EPI equation. This calculation has not been validated in all clinical situations. eGFR's persistently <60 mL/min signify possible Chronic Kidney Disease.    Anion gap 9 5 - 15    Comment: Performed at [facility] [address], [place], [place] [address]  CBC with Differential/Platelet     Status: Abnormal   Collection Time: 05/04/18  5:13 AM  Result Value Ref Range   WBC 12.2 (H) 4.0 - 10.5 K/uL   RBC 3.42 (L) 3.87 - 5.11 MIL/uL   Hemoglobin 10.3 (L) 12.0 - 15.0 g/dL   HCT 29.8 (L) 36.0 - 46.0 %   MCV  87.1 80.0 - 100.0 fL   MCH 30.1 26.0 - 34.0 pg   MCHC 34.6 30.0 - 36.0 g/dL   RDW 14.6 11.5 - 15.5 %   Platelets 124 (L) 150 - 400 K/uL    Comment: REPEATED TO VERIFY SPECIMEN CHECKED FOR CLOTS DELTA CHECK NOTED RESULTS VERIFIED VIA RECOLLECT    Neutrophils Relative % 79 %   Neutro Abs 9.7 (H) 1.7 - 7.7 K/uL   Lymphocytes Relative 9 %   Lymphs Abs 1.1 0.7 - 4.0 K/uL   Monocytes Relative 11 %   Monocytes Absolute  1.3 (H) 0.1 - 1.0 K/uL   Eosinophils Relative 0 %   Eosinophils Absolute 0.0 0.0 - 0.5 K/uL   Basophils Relative 0 %   Basophils Absolute 0.0 0.0 - 0.1 K/uL   Immature Granulocytes 1 %   Abs Immature Granulocytes 0.1 (H) 0.00 - 0.07 K/uL    Comment: Performed at [facility] [address], [place], [place] [address]  Glucose, capillary     Status: Abnormal   Collection Time: 05/04/18  6:23 AM  Result Value Ref Range   Glucose-Capillary 228 (H) 70 - 99 mg/dL  Glucose, capillary     Status: Abnormal   Collection Time: 05/04/18 11:13 AM  Result Value Ref Range   Glucose-Capillary 188 (H) 70 - 99 mg/dL   Comment 1 Notify RN    Comment 2 Document in Chart   Glucose, capillary     Status: Abnormal   Collection Time: 05/04/18  4:01 PM  Result Value Ref Range   Glucose-Capillary 225 (H) 70 - 99 mg/dL  Glucose, capillary     Status: Abnormal   Collection Time: 05/04/18  9:48 PM  Result Value Ref Range   Glucose-Capillary 229 (H) 70 - 99 mg/dL   Comment 1 Notify RN    Comment 2 Document in Chart   Basic metabolic panel     Status: Abnormal   Collection Time: 05/05/18  2:54 AM  Result Value Ref Range   Sodium 134 (L) 135 - 145 mmol/L   Potassium 4.3 3.5 - 5.1 mmol/L   Chloride 103 98 - 111 mmol/L   CO2 25 22 - 32 mmol/L   Glucose, Bld 203 (H) 70 - 99 mg/dL   BUN 19 8 - 23 mg/dL   Creatinine, Ser 1.24 (H) 0.44 - 1.00 mg/dL   Calcium 7.6 (L) 8.9 - 10.3 mg/dL   GFR calc non Af Amer 37 (L) >60 mL/min   GFR calc Af Amer 43 (L) >60 mL/min    Comment: (NOTE) The  eGFR has been calculated using the CKD EPI equation. This calculation has not been validated in all clinical situations. eGFR's persistently <60 mL/min signify possible Chronic Kidney Disease.    Anion gap 6 5 - 15    Comment: Performed at [facility] [address], [place], [place] [address]  CBC with Differential/Platelet     Status: Abnormal   Collection Time: 05/05/18  2:54 AM  Result Value Ref Range   WBC 12.2 (H) 4.0 - 10.5 K/uL   RBC 2.96 (L) 3.87 - 5.11 MIL/uL   Hemoglobin 8.5 (L) 12.0 - 15.0 g/dL   HCT 26.2 (L) 36.0 - 46.0 %   MCV 88.5 80.0 - 100.0 fL   MCH 28.7 26.0 - 34.0 pg   MCHC 32.4 30.0 - 36.0 g/dL   RDW 15.7 (H) 11.5 - 15.5 %   Platelets 126 (L) 150 - 400 K/uL   nRBC 0.0 0.0 - 0.2 %   Neutrophils Relative % 73 %   Neutro Abs 8.9 (H) 1.7 - 7.7 K/uL   Lymphocytes Relative 14 %   Lymphs Abs 1.7 0.7 - 4.0 K/uL   Monocytes Relative 13 %   Monocytes Absolute 1.5 (H) 0.1 - 1.0 K/uL   Eosinophils Relative 0 %   Eosinophils Absolute 0.0 0.0 - 0.5 K/uL   Basophils Relative 0 %   Basophils Absolute 0.0 0.0 - 0.1 K/uL   Immature Granulocytes 0 %   Abs Immature Granulocytes 0.04 0.00 - 0.07  K/uL    Comment: Performed at [facility], [place] [address] [place], [place] [address]  Glucose, capillary     Status: Abnormal   Collection Time: 05/05/18  6:33 AM  Result Value Ref Range   Glucose-Capillary 185 (H) 70 - 99 mg/dL   Comment 1 Notify RN    Comment 2 Document in Chart   Prepare RBC     Status: None   Collection Time: 05/05/18  8:31 AM  Result Value Ref Range   Order Confirmation      ORDER PROCESSED BY BLOOD BANK BB SAMPLE OR UNITS ALREADY AVAILABLE Performed at [facility], [place] [address], [place], [place] [address]   Glucose, capillary     Status: Abnormal   Collection Time: 05/05/18 11:11 AM  Result Value Ref Range   Glucose-Capillary 190 (H) 70 - 99 mg/dL   Comment 1 Notify RN    Comment 2 Document in Chart   Glucose, capillary      Status: Abnormal   Collection Time: 05/05/18  4:19 PM  Result Value Ref Range   Glucose-Capillary 167 (H) 70 - 99 mg/dL   Comment 1 Notify RN    Comment 2 Document in Chart    No results found.     Medical Problem List and Plan: 1.  Debility secondary to peripheral vascular disease status post enterectomy left external iliac common femoral superficial artery, left femoral to anterior tibial artery bypass 05/03/2018. 2.  DVT Prophylaxis/Anticoagulation: Subcutaneous Lovenox monitor for any bleeding episodes 3. Pain Management: Oxycodone/Ultram as needed 4. Mood: Provide emotional support 5. Neuropsych: This patient is capable of making decisions on her own behalf. 6. Skin/Wound Care: Routine skin checks 7. Fluids/Electrolytes/Nutrition: Routine in and outs with follow-up chemistries 8.  Leukocytosis/acute on chronic anemia.  Follow-up chemistries.  Continue iron supplement 9.  CKD.  Creatinine baseline 1.25.  Follow-up chemistries. 10.  Hypertension.  Lisinopril 40 mg daily, Norvasc 10 mg daily, Lopressor 25 mg twice daily 11.  Diastolic congestive heart failure.  Monitor for any signs of fluid overload.  Continue Lasix 12.  Diabetes mellitus with peripheral neuropathy.  Hemoglobin A1c 7.8.  Glucotrol 5 mg twice daily, NovoLog 3 units 3 times daily with meals.  Check blood sugars before meals and at bedtime 13.  Hyperlipidemia.  Lipitor 14.  CAD with stenting.  Will discuss with vascular surgery on resuming Plavix as prior to admission.  Post Admission Physician Evaluation: 1. Preadmission assessment reviewed and changes made below. 2. Functional deficits secondary  to debility. 3. Patient is admitted to receive collaborative, interdisciplinary care between the physiatrist, rehab nursing staff, and therapy team. 4. Patient's level of medical complexity and substantial therapy needs in context of that medical necessity cannot be provided at a lesser intensity of care such as a  SNF. 5. Patient has experienced substantial functional loss from his/her baseline which was documented above under the "Functional History" and "Functional Status" headings.  Judging by the patient's diagnosis, physical exam, and functional history, the patient has potential for functional progress which will result in measurable gains while on inpatient rehab.  These gains will be of substantial and practical use upon discharge  in facilitating mobility and self-care at the household level. 6. Physiatrist will provide 24 hour management of medical needs as well as oversight of the therapy plan/treatment and provide guidance as appropriate regarding the interaction of the two. 7. 24 hour rehab nursing will assist with safety, skin/wound care, disease management, pain management  and patient education  and help integrate therapy concepts, techniques,education, etc. 8. PT will assess and treat for/with: Lower extremity strength, range of motion, stamina, balance, functional mobility, safety, adaptive techniques and equipment, wound care, coping skills, pain control, education. Goals are: Supervision/min A. 9. OT will assess and treat for/with: ADL's, functional mobility, safety, upper extremity strength, adaptive techniques and equipment, wound mgt, ego support, and community reintegration.   Goals are: Supervision/min a. Therapy may proceed with showering this patient. 10. Case Management and Social Worker will assess and treat for psychological issues and discharge planning. 11. Team conference will be held weekly to assess progress toward goals and to determine barriers to discharge. 12. Patient will receive at least 3 hours of therapy per day at least 5 days per week. 13. ELOS: 8-12 days.       14. Prognosis:  excellent and good  I have personally performed a face to face diagnostic evaluation, including, but not limited to relevant history and physical exam findings, of this patient and developed  relevant assessment and plan.  Additionally, I have reviewed and concur with the physician assistant's documentation above.  The patient's status has not changed. The original post admission physician evaluation remains appropriate, and any changes from the pre-admission screening or documentation from the acute chart are noted above.    Delice Lesch, MD, ABPMR Lavon Paganini Angiulli, PA-C 05/05/2018

## 2018-05-06 ENCOUNTER — Inpatient Hospital Stay (HOSPITAL_COMMUNITY): Payer: Medicare Other | Admitting: Occupational Therapy

## 2018-05-06 ENCOUNTER — Inpatient Hospital Stay (HOSPITAL_COMMUNITY): Payer: Medicare Other | Admitting: Physical Therapy

## 2018-05-06 DIAGNOSIS — I10 Essential (primary) hypertension: Secondary | ICD-10-CM

## 2018-05-06 DIAGNOSIS — R5381 Other malaise: Principal | ICD-10-CM

## 2018-05-06 DIAGNOSIS — E1142 Type 2 diabetes mellitus with diabetic polyneuropathy: Secondary | ICD-10-CM

## 2018-05-06 DIAGNOSIS — I5032 Chronic diastolic (congestive) heart failure: Secondary | ICD-10-CM

## 2018-05-06 LAB — TYPE AND SCREEN
ABO/RH(D): A POS
Antibody Screen: NEGATIVE
Unit division: 0
Unit division: 0
Unit division: 0
Unit division: 0
Unit division: 0
Unit division: 0

## 2018-05-06 LAB — BPAM RBC
Blood Product Expiration Date: 201910292359
Blood Product Expiration Date: 201910292359
Blood Product Expiration Date: 201910292359
Blood Product Expiration Date: 201910292359
Blood Product Expiration Date: 201910292359
Blood Product Expiration Date: 201910302359
ISSUE DATE / TIME: 201910071805
ISSUE DATE / TIME: 201910071805
ISSUE DATE / TIME: 201910071805
ISSUE DATE / TIME: 201910071805
ISSUE DATE / TIME: 201910091005
ISSUE DATE / TIME: 201910091422
Unit Type and Rh: 6200
Unit Type and Rh: 6200
Unit Type and Rh: 6200
Unit Type and Rh: 6200
Unit Type and Rh: 6200
Unit Type and Rh: 6200

## 2018-05-06 LAB — COMPREHENSIVE METABOLIC PANEL
ALT: 7 U/L (ref 0–44)
AST: 15 U/L (ref 15–41)
Albumin: 2.3 g/dL — ABNORMAL LOW (ref 3.5–5.0)
Alkaline Phosphatase: 56 U/L (ref 38–126)
Anion gap: 8 (ref 5–15)
BUN: 21 mg/dL (ref 8–23)
CO2: 24 mmol/L (ref 22–32)
Calcium: 7.7 mg/dL — ABNORMAL LOW (ref 8.9–10.3)
Chloride: 101 mmol/L (ref 98–111)
Creatinine, Ser: 1.24 mg/dL — ABNORMAL HIGH (ref 0.44–1.00)
GFR calc Af Amer: 43 mL/min — ABNORMAL LOW (ref >60)
GFR calc non Af Amer: 37 mL/min — ABNORMAL LOW (ref >60)
Glucose, Bld: 208 mg/dL — ABNORMAL HIGH (ref 70–99)
Potassium: 4 mmol/L (ref 3.5–5.1)
Sodium: 133 mmol/L — ABNORMAL LOW (ref 135–145)
Total Bilirubin: 0.7 mg/dL (ref 0.3–1.2)
Total Protein: 4.4 g/dL — ABNORMAL LOW (ref 6.5–8.1)

## 2018-05-06 LAB — CBC WITH DIFFERENTIAL/PLATELET
Abs Immature Granulocytes: 0.07 10*3/uL (ref 0.00–0.07)
Basophils Absolute: 0 10*3/uL (ref 0.0–0.1)
Basophils Relative: 0 %
Eosinophils Absolute: 0.1 10*3/uL (ref 0.0–0.5)
Eosinophils Relative: 1 %
HCT: 33.5 % — ABNORMAL LOW (ref 36.0–46.0)
Hemoglobin: 11 g/dL — ABNORMAL LOW (ref 12.0–15.0)
Immature Granulocytes: 1 %
Lymphocytes Relative: 15 %
Lymphs Abs: 1.7 10*3/uL (ref 0.7–4.0)
MCH: 29.4 pg (ref 26.0–34.0)
MCHC: 32.8 g/dL (ref 30.0–36.0)
MCV: 89.6 fL (ref 80.0–100.0)
Monocytes Absolute: 1.3 10*3/uL — ABNORMAL HIGH (ref 0.1–1.0)
Monocytes Relative: 11 %
Neutro Abs: 8.3 10*3/uL — ABNORMAL HIGH (ref 1.7–7.7)
Neutrophils Relative %: 72 %
Platelets: 111 10*3/uL — ABNORMAL LOW (ref 150–400)
RBC: 3.74 MIL/uL — ABNORMAL LOW (ref 3.87–5.11)
RDW: 15.7 % — ABNORMAL HIGH (ref 11.5–15.5)
WBC: 11.7 10*3/uL — ABNORMAL HIGH (ref 4.0–10.5)
nRBC: 0 % (ref 0.0–0.2)

## 2018-05-06 LAB — GLUCOSE, CAPILLARY
Glucose-Capillary: 188 mg/dL — ABNORMAL HIGH (ref 70–99)
Glucose-Capillary: 141 mg/dL — ABNORMAL HIGH (ref 70–99)
Glucose-Capillary: 195 mg/dL — ABNORMAL HIGH (ref 70–99)

## 2018-05-06 MED ORDER — CLOPIDOGREL BISULFATE 75 MG PO TABS
75.0000 mg | ORAL_TABLET | Freq: Every day | ORAL | Status: DC
  Administered 2018-05-06 – 2018-05-18 (×13): 75 mg via ORAL
  Filled 2018-05-06 (×14): qty 1

## 2018-05-06 NOTE — Evaluation (Signed)
Physical Therapy Assessment and Plan  Patient Details  Name: Cynthia Ray MRN: [id number] Date of Birth: 1926/03/31  PT Diagnosis: Difficulty walking and Muscle weakness Rehab Potential: Fair ELOS: 12-14 days   Today's Date: 05/06/2018 PT Individual Time: 1300-1415 PT Individual Time Calculation (min): 75 min    Problem List:  Patient Active Problem List   Diagnosis Date Noted  . Debility 05/05/2018  . Leukocytosis   . Chronic combined systolic and diastolic congestive heart failure ([place])   . CKD (chronic kidney disease), stage III ([place])   . Acute on chronic anemia   . Chronic diastolic congestive heart failure ([place])   . Diabetes mellitus type 2 in nonobese (HCC)   . PAD (peripheral artery disease) ([place]) 04/30/2018  . GERD (gastroesophageal reflux disease) 04/30/2018  . Type II diabetes mellitus ([place]) 04/30/2018  . COPD (chronic obstructive pulmonary disease) ([place]) 04/30/2018  . Hypomagnesemia 04/30/2018  . Edema 04/30/2018  . PVD (peripheral vascular disease) ([place]) 03/24/2013  . Aftercare following surgery of the circulatory system, [place] 03/24/2013  . Swelling of limb-Right foot 03/24/2013  . Peripheral vascular disease, unspecified ([place]) 12/23/2012  . Atherosclerosis of native arteries of the extremities with intermittent claudication 06/23/2012  . Atherosclerosis of native arteries of the extremities with ulceration(440.23) 03/11/2012  . CAD (coronary artery disease) 01/20/2012  . Anemia 01/20/2012  . Hyponatremia 01/20/2012  . Pre-operative cardiovascular examination 12/30/2011  . HTN (hypertension) 12/30/2011  . Dyslipidemia 12/30/2011  . Occlusion and stenosis of carotid artery without mention of cerebral infarction 10/16/2011  . Atherosclerotic PVD with ulceration ([place]) 10/16/2011    Past Medical History:  Past Medical History:  Diagnosis Date  . Anemia   . Arthritis    "dr said I have it in my back" [place]   . Carotid artery occlusion   . CHF (congestive  heart failure) ([place]) 12/2017  . COPD (chronic obstructive pulmonary disease) ([place])   . Fractured hip ([place])    Fell and hurt R hip  . GERD (gastroesophageal reflux disease)   . History of blood transfusion 11/11/11  . Hyperlipidemia   . Hypertension   . PVD (peripheral vascular disease) ([place])    Stents left SFA 2004 Cynthia Ray  . Type II diabetes mellitus ([place])    Past Surgical History:  Past Surgical History:  Procedure Laterality Date  . ABDOMINAL AORTAGRAM N/A 10/31/2011   Procedure: ABDOMINAL Maxcine Ham;  Surgeon: Elam Dutch, MD;  Location: [facility] CATH LAB;  Service: Cardiovascular;  Laterality: N/A;  . ABDOMINAL AORTOGRAM W/LOWER EXTREMITY N/A 04/16/2018   Procedure: ABDOMINAL AORTOGRAM W/LOWER EXTREMITY;  Surgeon: Elam Dutch, MD;  Location: [facility] CV LAB;  Service: Cardiovascular;  Laterality: N/A;  . AMPUTATION  03/23/2012   Procedure: AMPUTATION DIGIT;  Surgeon: Elam Dutch, MD;  Location: [facility] OR;  Service: Vascular;  Laterality: Right;  Right Femoral endarterectomy with profundaplasty; right femoral-popliteal bypass with nonreversed saphenous vein; and right first toe amputation  . CATARACT EXTRACTION W/ INTRAOCULAR LENS  IMPLANT, BILATERAL  2002  . COLONOSCOPY    . CORONARY ANGIOPLASTY WITH STENT PLACEMENT  01/19/12   "1; first one I've ever had"  . ENDARTERECTOMY FEMORAL Left 05/03/2018   Procedure: LEFT FEMORAL ENDARTERECTOMY WITH PROFUNDAPLASTY;  Surgeon: Elam Dutch, MD;  Location: [facility];  Service: Vascular;  Laterality: Left;  . EYE SURGERY  2009   Laser bilateral   . FEMORAL ENDARTERECTOMY  03/23/2012   right  . FEMORAL-POPLITEAL BYPASS GRAFT  03/23/2012   Procedure: BYPASS  GRAFT FEMORAL-POPLITEAL ARTERY;  Surgeon: Elam Dutch, MD;  Location[facility] OR;  Service: Vascular;  Laterality: Right;  Right Femoral endarterectomy with profundaplasty; right femoral-popliteal bypass with nonreversed saphenous vein; and right first toe amputation  . FEMORAL-TIBIAL  BYPASS GRAFT Left 05/03/2018   Procedure: LEFT  FEMORAL TO ANTERIOR TIBIAL ARTERY BYPASS WITH COMPOSITE PTFE GRAFT AND REVERSED LEFT SAPHENOUS VEIN GRAFT.;  Surgeon: Elam Dutch, MD;  Location: [facility];  Service: Vascular;  Laterality: Left;  . HIP FRACTURE SURGERY Right 11/2017  . PATCH ANGIOPLASTY Left 05/03/2018   Procedure: PATCH ANGIOPLASTY LEFT FEMORAL ARTERY;  Surgeon: Elam Dutch, MD;  Location: [facility];  Service: Vascular;  Laterality: Left;  . PERCUTANEOUS CORONARY STENT INTERVENTION (PCI-S) N/A 01/19/2012   Procedure: PERCUTANEOUS CORONARY STENT INTERVENTION (PCI-S);  Surgeon: Cynthia M Martinique, MD;  Location: [facility] CATH LAB;  Service: Cardiovascular;  Laterality: N/A;  . PERIPHERAL ARTERIAL STENT GRAFT  2003   LLE  . POSTERIOR LAMINECTOMY / DECOMPRESSION LUMBAR SPINE  1989  . REFRACTIVE SURGERY  2009   bilaterally  . TONSILLECTOMY AND ADENOIDECTOMY  1946    Assessment & Plan Clinical Impression: Cynthia Ray is an 84 year old right-handed female with history of diastolic congestive heart failure, CAD with stenting, COPD, type 2 diabetes mellitus, right hip fracture May 2019 receiving inpatient rehab services at a skilled nursing facility, PVD and left SFA stenting maintained on Plavix and aspirin as well as 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  Per chart review patient lives alone in [place].  Used a walker prior to admission and was receiving home health therapies.  Daughter across the street assists as needed.  One level home.  Presented 04/30/2018 with nonhealing ulcers of the left foot.  Recent aortogram showed subtotal occlusion left distal femoral artery as well as occlusion left superficial femoral-popliteal artery.  Underwent endarterectomy of left external iliac common femoral superficial femoral artery with profundoplasty left femoral to anterior tibial artery bypass 05/03/2018 per Dr. Oneida Ray.  Hospital course leukocytosis 12,200, pain management.   Acute on chronic renal insufficiency creatinine baseline 1.25.  Subcutaneous Lovenox for DVT prophylaxis.  Acute blood loss anemia 5.4 transfused with follow-up hemoglobin 10.9 dropping to 8.5 transfused 2 units 05/05/2018.[place] Kitchen  Physical (per discussion with PT, notes pending) and occupational therapy evaluation completed 05/04/2018 with recommendations of physical medicine rehab consult.  Patient is to be admitted for a comprehensive rehab program on 05/05/18.     Patient currently requires mod with mobility secondary to muscle weakness and pain and decreased standing balance, decreased postural control and decreased balance strategies.  Prior to hospitalization, patient was modified independent  with mobility and lived with Alone in a House home.  Home access is 1Stairs to enter.  Patient will benefit from skilled PT intervention to maximize safe functional mobility, minimize fall risk and decrease caregiver burden for planned discharge home with 24 hour supervision.  Anticipate patient will benefit from follow up [place] at discharge.  PT - End of Session Activity Tolerance: Tolerates 10 - 20 min activity with multiple rests Endurance Deficit: Yes PT Assessment Rehab Potential (ACUTE/IP ONLY): Fair PT Barriers to Discharge: Inaccessible home environment;Decreased caregiver support;Insurance for SNF coverage PT Patient demonstrates impairments in the following area(s): Balance;Endurance;Motor;Pain PT Transfers Functional Problem(s): Bed Mobility;Bed to Chair;Car;Furniture PT Locomotion Functional Problem(s): Ambulation;Wheelchair Mobility;Stairs PT Plan PT Intensity: Minimum of 1-2 x/day ,45 to 90 minutes PT Frequency: 5 out of 7 days PT Duration Estimated Length of Stay:  12-14 days PT Treatment/Interventions: Ambulation/gait training;Balance/vestibular training;Community reintegration;Discharge planning;Neuromuscular re-education;Functional mobility training;Functional electrical stimulation;DME/adaptive  equipment instruction;Pain management;Patient/family education;Psychosocial support;UE/LE Coordination activities;UE/LE Strength taining/ROM;Therapeutic Exercise;Splinting/orthotics;Therapeutic Activities;Stair training;Wheelchair propulsion/positioning PT Transfers Anticipated Outcome(s): supervision PT Locomotion Anticipated Outcome(s): supervision ambulatory with LRAD, w/c for community distances PT Recommendation Recommendations for Other Services: Therapeutic Recreation consult Follow Up Recommendations: Home health PT;24 hour supervision/assistance Patient destination: Home Equipment Recommended: To be determined Equipment Details: has RW and w/c already  Skilled Therapeutic Intervention No c/o pain.  Session focus on initial PT evaluation and pt education re rehab process, goals, and ELOS.  Pt initially performing mobility as described below.  Sit<>stand varied throughout session from supervision<>mod assist depending on fatigue and surface height.  Gait training 2x26' + short distances for transfers (<10' each) and able to ambulate with supervision<>min assist.  PT instructed pt in w/c mobility x100' with 3 rest breaks for UE strengthening and overall cardiovascular endurance.  Pt returned to room at end of session, positioned in recliner with chair alarm intact, call bell in reach and needs met.   PT Evaluation Precautions/Restrictions Precautions Precautions: Fall Precaution Comments: non-healing bilat foot wounds Restrictions Weight Bearing Restrictions: No General PT Amount of Missed Time (min): 15 Minutes PT Missed Treatment Reason: Patient fatigue  Pain Pain Assessment Pain Scale: 0-10 Pain Score: 4  Pain Type: Surgical pain Pain Location: Leg Pain Orientation: Left Pain Descriptors / Indicators: Discomfort;Sore Pain Onset: On-going Patients Stated Pain Goal: 2 Pain Intervention(s): Repositioned Multiple Pain Sites: No Home Living/Prior Functioning Home  Living Available Help at Discharge: Family;Available PRN/intermittently Type of Home: House Home Access: Stairs to enter [employer] of Steps: 1 Entrance Stairs-Rails: None Home Layout: One level Bathroom Shower/Tub: Multimedia programmer: Standard Bathroom Accessibility: Yes Additional Comments: daughter lives across the street and helps pt with ADLs and IADLs  Lives With: Alone Prior Function Level of Independence: Requires assistive device for independence  Able to Take Stairs?: Yes(able to access home but states she "avoids stairs") Driving: No Vision/Perception  Perception Perception: Within Functional Limits Praxis Praxis: Intact  Cognition Overall Cognitive Status: Within Functional Limits for tasks assessed Arousal/Alertness: Awake/alert Orientation Level: Oriented to person;Oriented to place;Oriented to situation;Disoriented to time Attention: Selective Selective Attention: Appears intact Memory: Appears intact Safety/Judgment: Appears intact Sensation Sensation Light Touch: Impaired Detail Peripheral sensation comments: lacks sensation to LT bilaterally ankles and feet Light Touch Impaired Details: Impaired LLE;Impaired RLE Hot/Cold: Appears Intact Proprioception: Not tested Stereognosis: Not tested Coordination Gross Motor Movements are Fluid and Coordinated: No Fine Motor Movements are Fluid and Coordinated: Yes Coordination and Movement Description: decreased coordination 2/2 pain and weakness Motor  Motor Motor: Other (comment) Motor - Skilled Clinical Observations: generalized weakness  Mobility Bed Mobility Bed Mobility: Rolling Right;Rolling Left Rolling Right: Minimal Assistance - Patient > 75% Rolling Left: Minimal Assistance - Patient > 75% Transfers Transfers: Sit to Stand;Stand to Sit Sit to Stand: Moderate Assistance - Patient 50-74% Stand to Sit: Minimal Assistance - Patient > 75% Stand Pivot Transfers: Moderate  Assistance - Patient 50 - 74% Stand Pivot Transfer Details: Verbal cues for precautions/safety;Verbal cues for technique;Verbal cues for sequencing Transfer (Assistive device): Rolling walker Locomotion  Gait Ambulation: Yes Gait Assistance: Minimal Assistance - Patient > 75% Gait Distance (Feet): 26 Feet Assistive device: Rolling walker Gait Assistance Details: Tactile cues for posture Gait Gait: Yes Gait Pattern: Impaired Gait Pattern: Trunk flexed;Decreased stride length;Antalgic(antalgic on L) Gait velocity: slow Stairs / Additional Locomotion Stairs: Yes Stairs Assistance: Minimal Assistance - Patient >  75% Stair Management Technique: Two rails Number of Stairs: 2 Height of Stairs: 6 Wheelchair Mobility Wheelchair Mobility: No  Trunk/Postural Assessment  Cervical Assessment Cervical Assessment: Exceptions to WFL(forward head) Thoracic Assessment Thoracic Assessment: Exceptions to WFL(rounded shoulders, kyphotic) Lumbar Assessment Lumbar Assessment: Exceptions to WFL(posterior pelvic tilt) Postural Control Postural Control: Deficits on evaluation Protective Responses: delayed  Balance Balance Balance Assessed: Yes Dynamic Sitting Balance Dynamic Sitting - Level of Assistance: 5: Stand by assistance Static Standing Balance Static Standing - Balance Support: Bilateral upper extremity supported;During functional activity Static Standing - Level of Assistance: 5: Stand by assistance Dynamic Standing Balance Dynamic Standing - Balance Support: Bilateral upper extremity supported;During functional activity Dynamic Standing - Level of Assistance: 4: Min assist Extremity Assessment  RUE Assessment RUE Assessment: Exceptions to [facility] Active Range of Motion (AROM) Comments: limited to shoulder elevation at 80 degrees LUE Assessment LUE Assessment: Exceptions to [facility] Passive Range of Motion (PROM) Comments: 90 degrees shoulder elevation Active Range of Motion (AROM) Comments:  45 degrees shoulder elevation General Strength Comments: 3-/5 RLE Assessment RLE Assessment: Exceptions to [facility] Active Range of Motion (AROM) Comments: WFL assessed in sitting General Strength Comments: decreased strength throughout and denies pain with resistance testing RLE Strength Right Hip Flexion: 3+/5 Right Knee Flexion: 4/5 Right Knee Extension: 4/5 Right Ankle Dorsiflexion: 3/5 Right Ankle Plantar Flexion: 3/5 LLE Assessment LLE Assessment: Exceptions to [facility] Active Range of Motion (AROM) Comments: WFL assessed in sitting General Strength Comments: decreased strength throughout and denies pain with resistance testing LLE Strength Left Hip Flexion: 3/5 Left Knee Flexion: 3+/5 Left Knee Extension: 3+/5 Left Ankle Dorsiflexion: 3/5 Left Ankle Plantar Flexion: 3/5    Refer to Care Plan for Long Term Goals  Recommendations for other services: None   Discharge Criteria: Patient will be discharged from PT if patient refuses treatment 3 consecutive times without medical reason, if treatment goals not met, if there is a change in medical status, if patient makes no progress towards goals or if patient is discharged from hospital.  The above assessment, treatment plan, treatment alternatives and goals were discussed and mutually agreed upon: by patient  Michel Santee 05/06/2018, 2:21 PM

## 2018-05-06 NOTE — Progress Notes (Signed)
Call to Dr Oneida Alar office re: pt's bleeding from incision and venous stasis ulcer on left foot.  We do not have orders for dressings or treatment.  Left message with RN and states Dr Oneida Alar will call back.

## 2018-05-06 NOTE — Progress Notes (Signed)
Inpatient Rehabilitation Center Individual Statement of Services  Patient Name:  Cynthia Ray  Date:  05/06/2018  Welcome to the [facility].  Our goal is to provide you with an individualized program based on your diagnosis and situation, designed to meet your specific needs.  With this comprehensive rehabilitation program, you will be expected to participate in at least 3 hours of rehabilitation therapies Monday-Friday, with modified therapy programming on the weekends.  Your rehabilitation program will include the following services:  Physical Therapy (PT), Occupational Therapy (OT), 24 hour per day rehabilitation nursing, Case Management (Social Worker), Rehabilitation Medicine, Nutrition Services and Pharmacy Services  Weekly team conferences will be held on Tuesdays to discuss your progress.  Your Social Worker will talk with you frequently to get your input and to update you on team discussions.  Team conferences with you and your family in attendance may also be held.  Expected length of stay:  12 to 14 days  Overall anticipated outcome:  Supervision  Depending on your progress and recovery, your program may change. Your Social Worker will coordinate services and will keep you informed of any changes. Your Social Worker's name and contact numbers are listed  below.  The following services may also be recommended but are not provided by the [facility] will be made to provide these services after discharge if needed.  Arrangements include referral to agencies that provide these services.  Your insurance has been verified to be:  Medicare and secondary insurance Your primary doctor is:  Dr. Stoney Bang  Pertinent information will be shared with your doctor and your insurance company.  Social Worker:  Alfonse Alpers, LCSW  [phone] or ([phone]  Information discussed with and copy given to patient by: Trey Sailors, 05/06/2018, 3:32 PM

## 2018-05-06 NOTE — Progress Notes (Signed)
Occupational Therapy Assessment and Plan  Patient Details  Name: Cynthia Ray MRN: [id number] Date of Birth: 1925-12-27  OT Diagnosis: abnormal posture and muscle weakness (generalized) Rehab Potential: Rehab Potential (ACUTE ONLY): Good ELOS: 12-14 days   Today's Date: 05/06/2018 OT Individual Time: 1045-1200 OT Individual Time Calculation (min): 75 min     Problem List:  Patient Active Problem List   Diagnosis Date Noted  . Debility 05/05/2018  . Leukocytosis   . Chronic combined systolic and diastolic congestive heart failure ([place])   . CKD (chronic kidney disease), stage III ([place])   . Acute on chronic anemia   . Chronic diastolic congestive heart failure ([place])   . Diabetes mellitus type 2 in nonobese (HCC)   . PAD (peripheral artery disease) ([place]) 04/30/2018  . GERD (gastroesophageal reflux disease) 04/30/2018  . Type II diabetes mellitus ([place]) 04/30/2018  . COPD (chronic obstructive pulmonary disease) ([place]) 04/30/2018  . Hypomagnesemia 04/30/2018  . Edema 04/30/2018  . PVD (peripheral vascular disease) ([place]) 03/24/2013  . Aftercare following surgery of the circulatory system, [place] 03/24/2013  . Swelling of limb-Right foot 03/24/2013  . Peripheral vascular disease, unspecified ([place]) 12/23/2012  . Atherosclerosis of native arteries of the extremities with intermittent claudication 06/23/2012  . Atherosclerosis of native arteries of the extremities with ulceration(440.23) 03/11/2012  . CAD (coronary artery disease) 01/20/2012  . Anemia 01/20/2012  . Hyponatremia 01/20/2012  . Pre-operative cardiovascular examination 12/30/2011  . HTN (hypertension) 12/30/2011  . Dyslipidemia 12/30/2011  . Occlusion and stenosis of carotid artery without mention of cerebral infarction 10/16/2011  . Atherosclerotic PVD with ulceration ([place]) 10/16/2011    Past Medical History:  Past Medical History:  Diagnosis Date  . Anemia   . Arthritis    "dr said I have it in my back" [place]   .  Carotid artery occlusion   . CHF (congestive heart failure) ([place]) 12/2017  . COPD (chronic obstructive pulmonary disease) ([place])   . Fractured hip ([place])    Fell and hurt R hip  . GERD (gastroesophageal reflux disease)   . History of blood transfusion 11/11/11  . Hyperlipidemia   . Hypertension   . PVD (peripheral vascular disease) ([place])    Stents left SFA 2004 Dr. Albertine Patricia  . Type II diabetes mellitus ([place])    Past Surgical History:  Past Surgical History:  Procedure Laterality Date  . ABDOMINAL AORTAGRAM N/A 10/31/2011   Procedure: ABDOMINAL Maxcine Ham;  Surgeon: Elam Dutch, MD;  Location: [facility] C[facility] LAB;  Service: Cardiovascular;  Laterality: N/A;  . ABDOMINAL AORTOGRAM W/LOWER EXTREMITY N/A 04/16/2018   Procedure: ABDOMINAL AORTOGRAM W/LOWER EXTREMITY;  Surgeon: Elam Dutch, MD;  Location: [place] CV LAB;  Service: Cardiovascular;  Laterality: N/A;  . AMPUTATION  03/23/2012   Procedure: AMPUTATION DIGIT;  Surgeon: Elam Dutch, MD;  Location: [facility] OR;  Service: Vascular;  Laterality: Right;  Right Femoral endarterectomy with profundaplasty; right femoral-popliteal bypass with nonreversed saphenous vein; and right first toe amputation  . CATARACT EXTRACTION W/ INTRAOCULAR LENS  IMPLANT, BILATERAL  2002  . COLONOSCOPY    . CORONARY ANGIOPLASTY WITH STENT PLACEMENT  01/19/12   "1; first one I've ever had"  . ENDARTERECTOMY FEMORAL Left 05/03/2018   Procedure: LEFT FEMORAL ENDARTERECTOMY WITH PROFUNDAPLASTY;  Surgeon: Elam Dutch, MD;  Location: [place];  Service: Vascular;  Laterality: Left;  . EYE SURGERY  2009   Laser bilateral   . FEMORAL ENDARTERECTOMY  03/23/2012   right  . FEMORAL-POPLITEAL BYPASS GRAFT  03/23/2012   Procedure: BYPASS GRAFT FEMORAL-POPLITEAL ARTERY;  Surgeon: Elam Dutch, MD;  Location: [facility] OR;  Service: Vascular;  Laterality: Right;  Right Femoral endarterectomy with profundaplasty; right femoral-popliteal bypass with nonreversed saphenous vein; and  right first toe amputation  . FEMORAL-TIBIAL BYPASS GRAFT Left 05/03/2018   Procedure: LEFT  FEMORAL TO ANTERIOR TIBIAL ARTERY BYPASS WITH COMPOSITE PTFE GRAFT AND REVERSED LEFT SAPHENOUS VEIN GRAFT.;  Surgeon: Elam Dutch, MD;  Location: [place];  Service: Vascular;  Laterality: Left;  . HIP FRACTURE SURGERY Right 11/2017  . PATCH ANGIOPLASTY Left 05/03/2018   Procedure: PATCH ANGIOPLASTY LEFT FEMORAL ARTERY;  Surgeon: Elam Dutch, MD;  Location: [facility];  Service: Vascular;  Laterality: Left;  . PERCUTANEOUS CORONARY STENT INTERVENTION (PCI-S) N/A 01/19/2012   Procedure: PERCUTANEOUS CORONARY STENT INTERVENTION (PCI-S);  Surgeon: Peter M Martinique, MD;  Location: [facility] CATH LAB;  Service: Cardiovascular;  Laterality: N/A;  . PERIPHERAL ARTERIAL STENT GRAFT  2003   LLE  . POSTERIOR LAMINECTOMY / DECOMPRESSION LUMBAR SPINE  1989  . REFRACTIVE SURGERY  2009   bilaterally  . TONSILLECTOMY AND ADENOIDECTOMY  1946    Assessment & Plan Clinical Impression: Patient is a 84 y.o. year old female with history of diastolic congestive heart failure, CAD with stenting, COPD, type 2 diabetes mellitus, right hip fracture May 2019 receiving inpatient rehab services at a skilled nursing facility, PVD and left SFA stenting maintained on Plavix and aspirin as well as 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  Per chart review and patient, patient lives alone in [place].  Used a walker prior to admission and was receiving home health therapies.  Daughter across the street assists can provide intermittent assistance. One level home.  Presented 04/30/2018 with nonhealing ulcers of the left foot.  Recent aortogram showed subtotal occlusion left distal femoral artery as well as occlusion left superficial femoral-popliteal artery.  Underwent endarterectomy of left external iliac common femoral superficial femoral artery with profundoplasty left femoral to anterior tibial artery bypass 05/03/2018 per  Dr. Oneida Alar.  Hospital course leukocytosis 12,200, pain management.  Acute on chronic renal insufficiency creatinine baseline 1.25.  Subcutaneous Lovenox for DVT prophylaxis.  Acute blood loss anemia 5.4 transfused with follow-up hemoglobin 10.9 dropping to 8.5 transfused 2 units 05/05/2018.Marland Kitchen  Physical therapy evaluation completed 05/04/2018 with recommendations of physical medicine rehab consult .  Patient transferred to CIR on 05/05/2018 .    Patient currently requires max with basic self-care skills secondary to muscle weakness, decreased cardiorespiratoy endurance, decreased coordination and decreased sitting balance, decreased standing balance and decreased balance strategies.  Prior to hospitalization, patient could complete ADLs and IADLS with modified independent .  Patient will benefit from skilled intervention to increase independence with basic self-care skills prior to discharge home with care partner.  Anticipate patient will require 24 hour supervision and follow up home health.  OT - End of Session Activity Tolerance: Decreased this session Endurance Deficit: Yes Endurance Deficit Description: multiple rest breaks secondary to fatigue OT Assessment Rehab Potential (ACUTE ONLY): Good OT Barriers to Discharge Comments: none known at this time OT Patient demonstrates impairments in the following area(s): Balance;Endurance;Motor;Pain;Safety OT Basic ADL's Functional Problem(s): Grooming;Bathing;Dressing;Toileting OT Transfers Functional Problem(s): Toilet;Tub/Shower OT Additional Impairment(s): None OT Plan OT Intensity: Minimum of 1-2 x/day, 45 to 90 minutes OT Frequency: 5 out of 7 days OT Duration/Estimated Length of Stay: 12-14 days OT Treatment/Interventions: Balance/vestibular training;DME/adaptive equipment instruction;Patient/family education;Therapeutic Activities;Psychosocial support;Therapeutic Exercise;Community reintegration;Functional mobility training;Self Care/advanced  ADL  retraining;UE/LE Strength taining/ROM;Discharge planning;UE/LE Coordination activities;Disease mangement/prevention;Pain management OT Self Feeding Anticipated Outcome(s): n/a OT Basic Self-Care Anticipated Outcome(s): supervision OT Toileting Anticipated Outcome(s): supervision OT Bathroom Transfers Anticipated Outcome(s): supervision OT Recommendation Recommendations for Other Services: Therapeutic Recreation consult Therapeutic Recreation Interventions: [place] group Patient destination: Home Follow Up Recommendations: 24 hour supervision/assistance;Outpatient OT Equipment Recommended: To be determined   Skilled Therapeutic Intervention Upon entering the room, pt supine in bed with family members present in the room. OT educated pt and caregivers on OT purpose, POC, and goals with them verbalizing understanding and agreement. Pt performed supine >sit with min A. Pt stand from bed with mod lifting assistance and ambulating with increased time with use of RW and min A into bathroom for toileting needs. Pt required assistance with hygiene and clothing management after voiding. Pt engaged in bathing and dressing tasks at sink with multiple rest breaks secondary to fatigue. Sit >stand with mod lifting assistance and min A for standing balance as pt performs LB clothing management. Pt ambulating to recliner chair at end of session with chair alarm activated and call bell within reach.   OT Evaluation Precautions/Restrictions  Precautions Precautions: Fall Precaution Comments: non-healing bilat foot wounds Restrictions Weight Bearing Restrictions: No Vital Signs Therapy Vitals Pulse Rate: 76 Pain Pain Assessment Pain Scale: 0-10 Pain Score: 4  Pain Type: Surgical pain Pain Location: Leg Pain Orientation: Left Pain Descriptors / Indicators: Discomfort;Sore Pain Onset: On-going Patients Stated Pain Goal: 2 Pain Intervention(s): Repositioned Multiple Pain Sites: No Home Living/Prior  Functioning Home Living Available Help at Discharge: Family, Available 24 hours/day Type of Home: House Home Access: Stairs to enter Entrance Stairs-Rails: None Home Layout: One level Bathroom Shower/Tub: Multimedia programmer: Standard Bathroom Accessibility: Yes Prior Function Level of Independence: Needs assistance with ADLs, Needs assistance with homemaking, Requires assistive device for independence Vision Baseline Vision/History: Wears glasses Wears Glasses: Reading only Patient Visual Report: No change from baseline Cognition Overall Cognitive Status: Within Functional Limits for tasks assessed Arousal/Alertness: Awake/alert Orientation Level: Person;Place;Situation Person: Oriented Place: Oriented Situation: Oriented Year: 2019 Month: October Day of Week: Correct Memory: Appears intact Immediate Memory Recall: Sock;Blue;Bed Memory Recall: Sock;Blue;Bed Memory Recall Sock: Without Cue Memory Recall Blue: Without Cue Memory Recall Bed: Without Cue Sensation Sensation Light Touch: Appears Intact Hot/Cold: Appears Intact Proprioception: Not tested Stereognosis: Not tested Coordination Gross Motor Movements are Fluid and Coordinated: No Fine Motor Movements are Fluid and Coordinated: Yes Motor  Motor Motor - Skilled Clinical Observations: generalized weakness Mobility  Bed Mobility Bed Mobility: Rolling Right;Rolling Left Rolling Right: Minimal Assistance - Patient > 75% Rolling Left: Minimal Assistance - Patient > 75% Transfers Stand to Sit: Moderate Assistance - Patient 50-74%  Trunk/Postural Assessment  Cervical Assessment Cervical Assessment: Exceptions to WFL(forward head) Thoracic Assessment Thoracic Assessment: Exceptions to WFL(kyphotic) Lumbar Assessment Lumbar Assessment: Exceptions to WFL(posterior pelvic tilt) Postural Control Postural Control: Deficits on evaluation  Balance Balance Balance Assessed: Yes Dynamic Sitting  Balance Dynamic Sitting - Level of Assistance: 5: Stand by assistance Dynamic Standing Balance Dynamic Standing - Level of Assistance: 3: Mod assist;4: Min assist Extremity/Trunk Assessment RUE Assessment RUE Assessment: Exceptions to [facility] Active Range of Motion (AROM) Comments: limited to shoulder elevation at 80 degrees LUE Assessment LUE Assessment: Exceptions to [facility] Passive Range of Motion (PROM) Comments: 90 degrees shoulder elevation Active Range of Motion (AROM) Comments: 45 degrees shoulder elevation General Strength Comments: 3-/5     Refer to Care Plan for Long Term Goals  Recommendations  for other services: Therapeutic Recreation  Kitchen group   Discharge Criteria: Patient will be discharged from OT if patient refuses treatment 3 consecutive times without medical reason, if treatment goals not met, if there is a change in medical status, if patient makes no progress towards goals or if patient is discharged from hospital.  The above assessment, treatment plan, treatment alternatives and goals were discussed and mutually agreed upon: by patient and by family  Gypsy Decant 05/06/2018, 12:38 PM

## 2018-05-06 NOTE — Progress Notes (Signed)
Patient information reviewed and entered into eRehab system by Lubertha Leite, RN, CRRN, PPS Coordinator.  Information including medical coding, functional ability and quality indicators will be reviewed and updated through discharge.     Per nursing patient was given "Data Collection Information Summary for Patients in Inpatient Rehabilitation Facilities with attached "Privacy Act Statement-Health Care Records" upon admission.  

## 2018-05-06 NOTE — Progress Notes (Signed)
[place] PHYSICAL MEDICINE & REHABILITATION PROGRESS NOTE   Subjective/Complaints: Sitting in bed. No new complaints. Denies pain. Had a good night  ROS: Patient denies fever, rash, sore throat, blurred vision, nausea, vomiting, diarrhea, cough, shortness of breath or chest pain, joint or back pain, headache, or mood change.    Objective:   No results found. Recent Labs    05/05/18 2130 05/06/18 0456  WBC 18.2* 11.7*  HGB 12.1 11.0*  HCT 35.8* 33.5*  PLT 145* 111*   Recent Labs    05/05/18 2130 05/06/18 0456  NA 131* 133*  K 4.4 4.0  CL 99 101  CO2 23 24  GLUCOSE 313* 208*  BUN 22 21  CREATININE 1.29* 1.24*  CALCIUM 7.8* 7.7*    Intake/Output Summary (Last 24 hours) at 05/06/2018 0831 Last data filed at 05/05/2018 2050 Gross per 24 hour  Intake 120 ml  Output -  Net 120 ml     Physical Exam: Vital Signs Blood pressure (!) 131/50, pulse 85, temperature 98.5 F (36.9 C), temperature source Oral, resp. rate 17, height 5' (1.524 m), weight 60.6 kg, SpO2 96 %.  Constitutional: No distress . Vital signs reviewed. HEENT: EOMI, oral membranes moist Neck: supple Cardiovascular: RRR without murmur. No JVD    Respiratory: CTA Bilaterally without wheezes or rales. Normal effort    GI: BS +, non-tender, non-distended  Musculoskeletal:  LLE edema and tenderness. Left heel cord tight Neurological: alert and appropriate Oriented to person place and time.   Motor:  Shoulder abduction 2+/5, distally 4+5 Right upper extremity: 5 proximal distal Left lower extremity: Flexion, knee extension and ankle dorsiflexion 2- to 2/5 and limited by pain Right lower extremity: Hip flexion, knee extension 4-/5   dorsiflexion 4/5  Skin:  Incision site clean, sl drainage. Some bruising along leg.  History of right great toe amputation well-healed  Psychiatric: She has a normal mood and affect. Her behavior is normal.    Assessment/Plan: 1. Functional deficits secondary to debility,  PAD which require 3+ hours per day of interdisciplinary therapy in a comprehensive inpatient rehab setting.  Physiatrist is providing close team supervision and 24 hour management of active medical problems listed below.  Physiatrist and rehab team continue to assess barriers to discharge/monitor patient progress toward functional and medical goals  Care Tool:  Bathing              Bathing assist       Upper Body Dressing/Undressing Upper body dressing   What is the patient wearing?: Hospital gown only    Upper body assist Assist Level: Minimal Assistance - Patient > 75%    Lower Body Dressing/Undressing Lower body dressing            Lower body assist       Toileting Toileting    Toileting assist Assist for toileting: Maximal Assistance - Patient 25 - 49%     Transfers Chair/bed transfer  Transfers assist           Locomotion Ambulation   Ambulation assist              Walk 10 feet activity   Assist           Walk 50 feet activity   Assist           Walk 150 feet activity   Assist           Walk 10 feet on uneven surface  activity   Assist  Wheelchair     Assist               Wheelchair 50 feet with 2 turns activity    Assist            Wheelchair 150 feet activity     Assist            Medical Problem List and Plan: 1.  Debility secondary to peripheral vascular disease status post enterectomy left external iliac common femoral superficial artery, left femoral to anterior tibial artery bypass 05/03/2018.  -beginning therapies 2.  DVT Prophylaxis/Anticoagulation: Subcutaneous Lovenox monitor for any bleeding episodes 3. Pain Management: Oxycodone/Ultram as needed  -pain well controlled at present 4. Mood: Provide emotional support 5. Neuropsych: This patient is capable of making decisions on her own behalf. 6. Skin/Wound Care: Routine skin checks, local wound care 7.  Fluids/Electrolytes/Nutrition: encourage PO  -I personally reviewed the patient's labs today.   8.  Leukocytosis/acute on chronic anemia.  Follow-up chemistries.  Continue iron supplement  -wbc's down to 11.7 today 9.  CKD.  Creatinine baseline 1.25.    10.  Hypertension.  Lisinopril 40 mg daily, Norvasc 10 mg daily, Lopressor 25 mg twice daily 11.  Diastolic congestive heart failure.  Monitor for any signs of fluid overload.  Continue Lasix   Filed Weights   05/05/18 1825  Weight: 60.6 kg    12.  Diabetes mellitus with peripheral neuropathy.  Hemoglobin A1c 7.8.  Glucotrol 5 mg twice daily, NovoLog 3 units 3 times daily with meals.    -borderline control at present  -follow for pattern 13.  Hyperlipidemia.  Lipitor 14.  CAD with stenting.  Will discuss with vascular surgery on resuming Plavix as prior to admission.  LOS: 1 days A FACE TO FACE EVALUATION WAS PERFORMED  Meredith Staggers 05/06/2018, 8:31 AM

## 2018-05-06 NOTE — Progress Notes (Signed)
[facility]  Subjective  - some leg soreness   Objective (!) 131/50 76 98.5 F (36.9 C) (Oral) 17 96%  Intake/Output Summary (Last 24 hours) at 05/06/2018 0951 Last data filed at 05/05/2018 2050 Gross per 24 hour  Intake 120 ml  Output -  Net 120 ml   Incisions healing Easily palpable graft pulse  Assessment/Planning: Encourage ambulation Follow up with me in a few weeks  Ruta Hinds 05/06/2018 9:51 AM --  Laboratory Lab Results: Recent Labs    05/05/18 2130 05/06/18 0456  WBC 18.2* 11.7*  HGB 12.1 11.0*  HCT 35.8* 33.5*  PLT 145* 111*   BMET Recent Labs    05/05/18 2130 05/06/18 0456  NA 131* 133*  K 4.4 4.0  CL 99 101  CO2 23 24  GLUCOSE 313* 208*  BUN 22 21  CREATININE 1.29* 1.24*  CALCIUM 7.8* 7.7*    COAG Lab Results  Component Value Date   INR 1.05 05/02/2018   INR 1.08 03/17/2012   INR 1.03 01/19/2012   No results found for: PTT

## 2018-05-07 ENCOUNTER — Inpatient Hospital Stay (HOSPITAL_COMMUNITY): Payer: Medicare Other | Admitting: Occupational Therapy

## 2018-05-07 ENCOUNTER — Inpatient Hospital Stay (HOSPITAL_COMMUNITY): Payer: Medicare Other | Admitting: Physical Therapy

## 2018-05-07 LAB — GLUCOSE, CAPILLARY
Glucose-Capillary: 157 mg/dL — ABNORMAL HIGH (ref 70–99)
Glucose-Capillary: 148 mg/dL — ABNORMAL HIGH (ref 70–99)
Glucose-Capillary: 161 mg/dL — ABNORMAL HIGH (ref 70–99)
Glucose-Capillary: 161 mg/dL — ABNORMAL HIGH (ref 70–99)
Glucose-Capillary: 151 mg/dL — ABNORMAL HIGH (ref 70–99)

## 2018-05-07 MED ORDER — COLLAGENASE 250 UNIT/GM EX OINT
TOPICAL_OINTMENT | Freq: Every day | CUTANEOUS | Status: DC
  Administered 2018-05-07 – 2018-05-13 (×7): via TOPICAL
  Administered 2018-05-14 (×2): 1 via TOPICAL
  Filled 2018-05-07: qty 30

## 2018-05-07 NOTE — Progress Notes (Signed)
Occupational Therapy Session Note  Patient Details  Name: Cynthia Ray MRN: [id number] Date of Birth: 02-Jun-1926  Today's Date: 05/07/2018 OT Individual Time: 5859-2924 OT Individual Time Calculation (min): 70 min   Short Term Goals: Week 1:  OT Short Term Goal 1 (Week 1): Pt will perform LB dressing with min A. OT Short Term Goal 2 (Week 1): Pt will perform toileting with min A OT Short Term Goal 3 (Week 1): Pt will perform toilet transfer with min A and use of RW.  Skilled Therapeutic Interventions/Progress Updates:    Pt greeted in bed with no c/o pain. Requesting a shower. Per consultation with RN, OT covered multiple incision sites on L LE and Lt foot ulcer. All functional bathroom transfers completed with steady assist at ambulatory level using RW, with Mod A sit<stand from surfaces. Pt required vcs for upright posture and forward gaze during transfers.She started tx with toilet transfer to void bladder, and then ambulated to TTB with vcs for technique.  Sit<stand with Mod A and OT completed perihygiene. Assist required for reaching/washing LEs. Afterwards she dressed sit<stand from recliner. Pt required Max A for LB due to pain with modified techniques/figure 4. She would benefit from AE training. Assist also required for elevating pants over hips due to decreased standing endurance and requirement of unilateral UE support on walker to maintain balance. At end of session pt was repositioned for comfort with LEs elevated for Lt edema mgt. She was left with RN at session exit.    Therapy Documentation Precautions:  Precautions Precautions: Fall Precaution Comments: non-healing bilat foot wounds Restrictions Weight Bearing Restrictions: No Vital Signs: Therapy Vitals Pulse Rate: 93 BP: (!) 147/54 Patient Position (if appropriate): Sitting Oxygen Therapy SpO2: 98 % O2 Device: Room Air Pain: No c/o pain during tx    ADL:       Therapy/Group: Individual Therapy  Kashonda Sarkisyan  A Ervin Rothbauer 05/07/2018, 9:11 AM

## 2018-05-07 NOTE — Progress Notes (Signed)
Physical Therapy Session Note  Patient Details  Name: Cynthia Ray MRN: [id number] Date of Birth: 05-May-1926  Today's Date: 05/06/2018 PT Individual Time:1530-1600   30 min   Short Term Goals: Week 1:  PT Short Term Goal 1 (Week 1): Pt will transfer with min assist consistently PT Short Term Goal 2 (Week 1): Pt will ambulate 75' with LRAD and min assist PT Short Term Goal 3 (Week 1): Pt will negotiate 4 steps with 2 rails and min assist PT Short Term Goal 4 (Week 1): Pt will complete standardized balance assessment.   Skilled Therapeutic Interventions/Progress Updates:    Pt received sitting in recliner and agreeable to PT. RN present to re-dress LLE wound. PT instructed pt in UE therex. Chest press with 2# bar weight x 10, ball toss 2 x 1 min. Shoulder flexion with AAROM due to shoulder ROM limitations 2 x10 BUE. Press ups into partial stand x 5 with moderate cues for anterior weight shift to improve WB through BLE. Pt left in recliner with all needs met.       Therapy Documentation Precautions:  Precautions Precautions: Fall Precaution Comments: non-healing bilat foot wounds Restrictions Weight Bearing Restrictions: No    Vital Signs: Therapy Vitals Temp: 98.1 F (36.7 C) Temp Source: Oral Pulse Rate: 87 Resp: 18 BP: (!) 132/54 Patient Position (if appropriate): Lying Oxygen Therapy SpO2: 97 % Pain: Denies    Therapy/Group: Individual Therapy  Lorie Phenix 05/07/2018, 8:00 AM

## 2018-05-07 NOTE — Progress Notes (Signed)
Social Work Assessment and Plan  Patient Details  Name: Cynthia Ray MRN: [id number] Date of Birth: 17-Apr-1926  Today's Date: 05/06/2018  Problem List:  Patient Active Problem List   Diagnosis Date Noted  . Debility 05/05/2018  . Leukocytosis   . Chronic combined systolic and diastolic congestive heart failure ([place])   . CKD (chronic kidney disease), stage III ([place])   . Acute on chronic anemia   . Chronic diastolic congestive heart failure ([place])   . Diabetes mellitus type 2 in nonobese (HCC)   . PAD (peripheral artery disease) ([place]) 04/30/2018  . GERD (gastroesophageal reflux disease) 04/30/2018  . Type II diabetes mellitus ([place]) 04/30/2018  . COPD (chronic obstructive pulmonary disease) ([place]) 04/30/2018  . Hypomagnesemia 04/30/2018  . Edema 04/30/2018  . PVD (peripheral vascular disease) ([place]) 03/24/2013  . Aftercare following surgery of the circulatory system, [place] 03/24/2013  . Swelling of limb-Right foot 03/24/2013  . Peripheral vascular disease, unspecified ([place]) 12/23/2012  . Atherosclerosis of native arteries of the extremities with intermittent claudication 06/23/2012  . Atherosclerosis of native arteries of the extremities with ulceration(440.23) 03/11/2012  . CAD (coronary artery disease) 01/20/2012  . Anemia 01/20/2012  . Hyponatremia 01/20/2012  . Pre-operative cardiovascular examination 12/30/2011  . HTN (hypertension) 12/30/2011  . Dyslipidemia 12/30/2011  . Occlusion and stenosis of carotid artery without mention of cerebral infarction 10/16/2011  . Atherosclerotic PVD with ulceration ([place]) 10/16/2011   Past Medical History:  Past Medical History:  Diagnosis Date  . Anemia   . Arthritis    "dr said I have it in my back" [place]   . Carotid artery occlusion   . CHF (congestive heart failure) ([place]) 12/2017  . COPD (chronic obstructive pulmonary disease) ([place])   . Fractured hip ([place])    Fell and hurt R hip  . GERD (gastroesophageal reflux disease)   .  History of blood transfusion 11/11/11  . Hyperlipidemia   . Hypertension   . PVD (peripheral vascular disease) ([place])    Stents left SFA 2004 Dr. Albertine Patricia  . Type II diabetes mellitus ([place])    Past Surgical History:  Past Surgical History:  Procedure Laterality Date  . ABDOMINAL AORTAGRAM N/A 10/31/2011   Procedure: ABDOMINAL Maxcine Ham;  Surgeon: Elam Dutch, MD;  Location: [facility] C[facility] LAB;  Service: Cardiovascular;  Laterality: N/A;  . ABDOMINAL AORTOGRAM W/LOWER EXTREMITY N/A 04/16/2018   Procedure: ABDOMINAL AORTOGRAM W/LOWER EXTREMITY;  Surgeon: Elam Dutch, MD;  Location: [place] CV LAB;  Service: Cardiovascular;  Laterality: N/A;  . AMPUTATION  03/23/2012   Procedure: AMPUTATION DIGIT;  Surgeon: Elam Dutch, MD;  Location: [facility] OR;  Service: Vascular;  Laterality: Right;  Right Femoral endarterectomy with profundaplasty; right femoral-popliteal bypass with nonreversed saphenous vein; and right first toe amputation  . CATARACT EXTRACTION W/ INTRAOCULAR LENS  IMPLANT, BILATERAL  2002  . COLONOSCOPY    . CORONARY ANGIOPLASTY WITH STENT PLACEMENT  01/19/12   "1; first one I've ever had"  . ENDARTERECTOMY FEMORAL Left 05/03/2018   Procedure: LEFT FEMORAL ENDARTERECTOMY WITH PROFUNDAPLASTY;  Surgeon: Elam Dutch, MD;  Location: [place];  Service: Vascular;  Laterality: Left;  . EYE SURGERY  2009   Laser bilateral   . FEMORAL ENDARTERECTOMY  03/23/2012   right  . FEMORAL-POPLITEAL BYPASS GRAFT  03/23/2012   Procedure: BYPASS GRAFT FEMORAL-POPLITEAL ARTERY;  Surgeon: Elam Dutch, MD;  Location: [facility] OR;  Service: Vascular;  Laterality: Right;  Right Femoral endarterectomy with profundaplasty; right femoral-popliteal bypass with  nonreversed saphenous vein; and right first toe amputation  . FEMORAL-TIBIAL BYPASS GRAFT Left 05/03/2018   Procedure: LEFT  FEMORAL TO ANTERIOR TIBIAL ARTERY BYPASS WITH COMPOSITE PTFE GRAFT AND REVERSED LEFT SAPHENOUS VEIN GRAFT.;  Surgeon: Elam Dutch, MD;  Location: [place];  Service: Vascular;  Laterality: Left;  . HIP FRACTURE SURGERY Right 11/2017  . PATCH ANGIOPLASTY Left 05/03/2018   Procedure: PATCH ANGIOPLASTY LEFT FEMORAL ARTERY;  Surgeon: Elam Dutch, MD;  Location: [place];  Service: Vascular;  Laterality: Left;  . PERCUTANEOUS CORONARY STENT INTERVENTION (PCI-S) N/A 01/19/2012   Procedure: PERCUTANEOUS CORONARY STENT INTERVENTION (PCI-S);  Surgeon: Peter M Martinique, MD;  Location: [facility] CATH LAB;  Service: Cardiovascular;  Laterality: N/A;  . PERIPHERAL ARTERIAL STENT GRAFT  2003   LLE  . POSTERIOR LAMINECTOMY / DECOMPRESSION LUMBAR SPINE  1989  . REFRACTIVE SURGERY  2009   bilaterally  . TONSILLECTOMY AND ADENOIDECTOMY  1946   Social History:  reports that she quit smoking about 30 years ago. Her smoking use included cigarettes. She started smoking about 54 years ago. She has a 24.00 pack-year smoking history. She has never used smokeless tobacco. She reports that she does not drink alcohol or use drugs.  Family / Support Systems Marital Status: Widow/Widower Patient Roles: Parent, Other (Comment)(aunt) Children: Geanie Kenning  - dtr - [phone] - checks on pt 3x/day - lives across the street Other Supports: granddtr, niece Anticipated Caregiver: Vivien Rota (daugther) and granddaugther Ability/Limitations of Caregiver: supervision/set up A; with some assist for bathing as she did PTA - dtr needs hip replacement Caregiver Availability: Intermittent Family Dynamics: supportive dtr, but physically limited  Social History Preferred language: English Religion: Baptist Read: Yes Write: Yes Employment Status: Retired Public relations account executive Issues: none reported Guardian/Conservator: N/A - MD has determined that pt is capable of making his own decisions.   Abuse/Neglect Abuse/Neglect Assessment Can Be Completed: Yes Physical Abuse: Denies Verbal Abuse: Denies Sexual Abuse: Denies Exploitation of patient/patient's  resources: Denies Self-Neglect: Denies  Emotional Status Pt's affect, behavior and adjustment status: Pt very positive and upbeat about her recovery. Recent Psychosocial Issues: none reported Psychiatric History: none reported Substance Abuse History: none reported  Patient / Family Perceptions, Expectations & Goals Pt/Family understanding of illness & functional limitations: Pt/dtr have a good understanding of pt's condition/limitations. Premorbid pt/family roles/activities: Pt likes to do word puzzle books and watch TV.  Really only leaves house for doctor appointments.  Used to go out to shop. Anticipated changes in roles/activities/participation: Pt would like to resume activities as she is able. Pt/family expectations/goals: Pt would like to be able to go shopping again.  Community [employer] Agencies: None Premorbid Home Care/DME Agencies: Other (Comment)(Has HH, but cannot remember agency name.  CSW will ask dtr.) Transportation available at discharge: family  Discharge Planning Living Arrangements: Alone Support Systems: Children, Other relatives, Home care staff, Friends/neighbors Type of Residence: Private residence Insurance Resources: [employer], Multimedia programmer (specify)(secondary insurance) Financial Resources: Radio broadcast assistant Screen Referred: No Money Management: Patient, Family Does the patient have any problems obtaining your medications?: No Home Management: Pt's family assists with this. Patient/Family Preliminary Plans: Pt wants to return to her home with family checking on her intermittently.  CSW will talk with pt and dtr about team's recommendations for supervision initially. Social Work Anticipated Follow Up Needs: HH/OP Expected length of stay: 12 to 14 days  Clinical Impression CSW met with pt to introduce self and role of CSW, as well as to  complete assessment.  Pt's dtr was not present, but CSW met her the day before while she toured  the unit and left her a message as well.  Pt does not have any current concerns/questions/needs at this time.  She was planning to go home from acute, but is now glad she came to CIR to get stronger.  Pt may have planned on a shorter LOS, as therapy team is now recommending 12 to 14 days.  CSW will continue to follow and assist as needed.  Kendle Turbin, Silvestre Mesi 05/07/2018, 1:36 PM

## 2018-05-07 NOTE — Progress Notes (Signed)
[place] PHYSICAL MEDICINE & REHABILITATION PROGRESS NOTE   Subjective/Complaints: Up with therapies. No new complaints today. Had good night  ROS: Patient denies fever, rash, sore throat, blurred vision, nausea, vomiting, diarrhea, cough, shortness of breath or chest pain, joint or back pain, headache, or mood change.   Objective:   No results found. Recent Labs    05/05/18 2130 05/06/18 0456  WBC 18.2* 11.7*  HGB 12.1 11.0*  HCT 35.8* 33.5*  PLT 145* 111*   Recent Labs    05/05/18 2130 05/06/18 0456  NA 131* 133*  K 4.4 4.0  CL 99 101  CO2 23 24  GLUCOSE 313* 208*  BUN 22 21  CREATININE 1.29* 1.24*  CALCIUM 7.8* 7.7*    Intake/Output Summary (Last 24 hours) at 05/07/2018 0853 Last data filed at 05/06/2018 1822 Gross per 24 hour  Intake 690 ml  Output -  Net 690 ml     Physical Exam: Vital Signs Blood pressure (!) 132/54, pulse 87, temperature 98.1 F (36.7 C), temperature source Oral, resp. rate 18, height 5' (1.524 m), weight 60.6 kg, SpO2 97 %.  Constitutional: No distress . Vital signs reviewed. HEENT: EOMI, oral membranes moist Neck: supple Cardiovascular: RRR without murmur. No JVD    Respiratory: CTA Bilaterally without wheezes or rales. Normal effort    GI: BS +, non-tender, non-distended   Musculoskeletal:  LLE edema and tenderness. Left heel cord still tight Neurological: alert and appropriate Oriented to person place and time.   Motor: LUE 2+ prox to 4+ distally Right upper extremity: 5 proximal distal Left lower extremity: Flexion, knee extension and ankle dorsiflexion 2- to 2/5 and limited by pain Right lower extremity: Hip flexion, knee extension 4-/5   dorsiflexion 4/5  Skin:  LLE incisions clean with minimal drainage.  Psychiatric: She has a normal mood and affect. Her behavior is normal.    Assessment/Plan: 1. Functional deficits secondary to debility, PAD which require 3+ hours per day of interdisciplinary therapy in a  comprehensive inpatient rehab setting.  Physiatrist is providing close team supervision and 24 hour management of active medical problems listed below.  Physiatrist and rehab team continue to assess barriers to discharge/monitor patient progress toward functional and medical goals  Care Tool:  Bathing    Body parts bathed by patient: Right arm, Left arm, Chest, Abdomen, Front perineal area, Right upper leg, Left upper leg, Face   Body parts bathed by helper: Buttocks, Right lower leg, Left lower leg     Bathing assist Assist Level: Moderate Assistance - Patient 50 - 74%     Upper Body Dressing/Undressing Upper body dressing   What is the patient wearing?: Pull over shirt    Upper body assist Assist Level: Minimal Assistance - Patient > 75%    Lower Body Dressing/Undressing Lower body dressing      What is the patient wearing?: Underwear/pull up     Lower body assist Assist for lower body dressing: Moderate Assistance - Patient 50 - 74%     Toileting Toileting    Toileting assist Assist for toileting: Minimal Assistance - Patient > 75%     Transfers Chair/bed transfer  Transfers assist     Chair/bed transfer assist level: Moderate Assistance - Patient 50 - 74%     Locomotion Ambulation   Ambulation assist      Assist level: Minimal Assistance - Patient > 75% Assistive device: Walker-rolling Max distance: 20   Walk 10 feet activity   Assist  Assist level: Minimal Assistance - Patient > 75%     Walk 50 feet activity   Assist Walk 50 feet with 2 turns activity did not occur: Safety/medical concerns         Walk 150 feet activity   Assist Walk 150 feet activity did not occur: Safety/medical concerns         Walk 10 feet on uneven surface  activity   Assist     Assist level: Minimal Assistance - Patient > 75% Assistive device: Aeronautical engineer Will patient use wheelchair at discharge?: (tbd)              Wheelchair 50 feet with 2 turns activity    Assist            Wheelchair 150 feet activity     Assist            Medical Problem List and Plan: 1.  Debility secondary to peripheral vascular disease status post enterectomy left external iliac common femoral superficial artery, left femoral to anterior tibial artery bypass 05/03/2018.  -Continue CIR therapies including PT, OT  2.  DVT Prophylaxis/Anticoagulation: Subcutaneous Lovenox monitor for any bleeding episodes 3. Pain Management: Oxycodone/Ultram as needed  -pain well controlled at present 4. Mood: Provide emotional support 5. Neuropsych: This patient is capable of making decisions on her own behalf. 6. Skin/Wound Care: Routine skin checks, local wound care 7. Fluids/Electrolytes/Nutrition: encourage PO 8.  Leukocytosis/acute on chronic anemia.     Continue iron supplement  -wbc's down to 11.7 10/10 9.  CKD.  Creatinine baseline 1.25.    10.  Hypertension.  Lisinopril 40 mg daily, Norvasc 10 mg daily, Lopressor 25 mg twice daily 11.  Diastolic congestive heart failure.  Monitor for any signs of fluid overload.  Continue Lasix   Filed Weights   05/05/18 1825  Weight: 60.6 kg    12.  Diabetes mellitus with peripheral neuropathy.  Hemoglobin A1c 7.8.  Glucotrol 5 mg twice daily, NovoLog 3 units 3 times daily with meals.    -borderline to fair control at present 10/10  -no changes today. Follow for consistent pattern,intake 13.  Hyperlipidemia.  Lipitor 14.  CAD with stenting.  Will discuss with vascular surgery on resuming Plavix as prior to admission.   LOS: 2 days A FACE TO FACE EVALUATION WAS PERFORMED  Meredith Staggers 05/07/2018, 8:53 AM

## 2018-05-07 NOTE — Plan of Care (Signed)
  Problem: RH SKIN INTEGRITY Goal: RH STG SKIN FREE OF INFECTION/BREAKDOWN Description With min assist  Outcome: Progressing Goal: RH STG ABLE TO PERFORM INCISION/WOUND CARE W/ASSISTANCE Description STG Able To Perform Incision/Wound Care With Assistance Min.  Outcome: Progressing   Problem: RH SAFETY Goal: RH STG ADHERE TO SAFETY PRECAUTIONS W/ASSISTANCE/DEVICE Description STG Adhere to Safety Precautions With Assistance/Device. Mod I  Outcome: Progressing   Problem: RH PAIN MANAGEMENT Goal: RH STG PAIN MANAGED AT OR BELOW PT'S PAIN GOAL Description Less than 3   Outcome: Progressing

## 2018-05-07 NOTE — Progress Notes (Signed)
Incisions on groin and left leg are oozinng. Peri-wound is purple, slightly firm and pt complaints of soreness at the sites. Linna Hoff, PA made aware. Continue plan of care.  Elasha Tess W Haddon Fyfe

## 2018-05-07 NOTE — IPOC Note (Signed)
Overall Plan of Care [facility]) Patient Details Name: Cynthia Ray MRN: [id number] DOB: Jul 28, 1926  Admitting Diagnosis: <principal problem not specified>  Hospital Problems: Active Problems:   Debility     Functional Problem List: Nursing Edema, Medication Management, Pain, Skin Integrity  PT Balance, Endurance, Motor, Pain  OT Balance, Endurance, Motor, Pain, Safety  SLP    TR         Basic ADL's: OT Grooming, Bathing, Dressing, Toileting     Advanced  ADL's: OT       Transfers: PT Bed Mobility, Bed to Chair, Car, Manufacturing systems engineer, Metallurgist: PT Ambulation, Emergency planning/management officer, Stairs     Additional Impairments: OT None  SLP        TR      Anticipated Outcomes Item Anticipated Outcome  Self Feeding n/a  Swallowing      Basic self-care  supervision  Toileting  supervision   Bathroom Transfers supervision  Bowel/Bladder  remain continent of bowel and bladder  Transfers  supervision  Locomotion  supervision ambulatory with LRAD, w/c for community distances  Communication     Cognition     Pain  less than 4  Safety/Judgment  Remain free of skin breakdown, infection and falls while on the unit   Therapy Plan: PT Intensity: Minimum of 1-2 x/day ,45 to 90 minutes PT Frequency: 5 out of 7 days PT Duration Estimated Length of Stay: 12-14 days OT Intensity: Minimum of 1-2 x/day, 45 to 90 minutes OT Frequency: 5 out of 7 days OT Duration/Estimated Length of Stay: 12-14 days      Team Interventions: Nursing Interventions Patient/Family Education, Pain Management, Skin Care/Wound Management, Discharge Planning, Psychosocial Support  PT interventions Ambulation/gait training, Training and development officer, Community reintegration, Discharge planning, Neuromuscular re-education, Functional mobility training, Functional electrical stimulation, DME/adaptive equipment instruction, Pain management, Patient/family education, Psychosocial  support, UE/LE Coordination activities, UE/LE Strength taining/ROM, Therapeutic Exercise, Splinting/orthotics, Therapeutic Activities, Stair training, Wheelchair propulsion/positioning  OT Interventions Training and development officer, Engineer, drilling, Patient/family education, Therapeutic Activities, Psychosocial support, Therapeutic Exercise, Community reintegration, Functional mobility training, Self Care/advanced ADL retraining, UE/LE Strength taining/ROM, Discharge planning, UE/LE Coordination activities, Disease mangement/prevention, Pain management  SLP Interventions    TR Interventions    SW/CM Interventions Discharge Planning, Psychosocial Support, Patient/Family Education   Barriers to Discharge MD  Medical stability  Nursing      PT Inaccessible home environment, Decreased caregiver support, Insurance for SNF coverage    OT   none known at this time  SLP      SW       Team Discharge Planning: Destination: PT-Home ,OT- Home , SLP-  Projected Follow-up: PT-Home health PT, 24 hour supervision/assistance, OT-  24 hour supervision/assistance, Outpatient OT, SLP-  Projected Equipment Needs: PT-To be determined, OT- To be determined, SLP-  Equipment Details: PT-has RW and w/c already, OT-  Patient/family involved in discharge planning: PT- Patient,  OT-Patient, Family member/caregiver, SLP-   MD ELOS: 12-14 days Medical Rehab Prognosis:  Excellent Assessment: The patient has been admitted for CIR therapies with the diagnosis of debility and altered mobility after LLE BPG. The team will be addressing functional mobility, strength, stamina, balance, safety, adaptive techniques and equipment, self-care, bowel and bladder mgt, patient and caregiver education, pain mgt, wound care, activity tolerance, community reentry. Goals have been set at supervision for mobility and self-care tasks.    Meredith Staggers, MD, [facility]      See Team Conference Notes for weekly  updates to the plan of care

## 2018-05-07 NOTE — Progress Notes (Signed)
Physical Therapy Session Note  Patient Details  Name: Cynthia Ray MRN: [id number] Date of Birth: 12/26/1925  Today's Date: 05/07/2018 PT Individual Time: 1630-1730 60 min   Short Term Goals: Week 1:  PT Short Term Goal 1 (Week 1): Pt will transfer with min assist consistently PT Short Term Goal 2 (Week 1): Pt will ambulate 53' with LRAD and min assist PT Short Term Goal 3 (Week 1): Pt will negotiate 4 steps with 2 rails and min assist PT Short Term Goal 4 (Week 1): Pt will complete standardized balance assessment.   Skilled Therapeutic Interventions/Progress Updates:   Pt received supine in bed and agreeable to PT. Supine>sit transfer with supervision assist and min cues for safety.    Stand pivot transfer to [facility] with CGA from PT and UE support on RW. Min cues for UE placement and anterior weight shfit  Gait training in day room x 59f with RW and min assist from PT for safety. Dynamic gait training instructed by PT to weave through 4 cones x 2 with min assist overall and and UE support on RW. Stepping over 4 1inch obstacles x 2. Pt required prolonged rest break between each bout due to excessive fatigue   Sit<>stand x 5 with min-mod assist from PT with moderate cues for safety, UE placement and improved sequencing.   WC mobility x 1035fwith min-supervision from PT with min cues for improved use of the LUE to prevent hitting wall.    [facility] returned to [facility] and [facility] and all needs met.          Therapy Documentation Precautions:  Precautions Precautions: Fall Precaution Comments: non-healing bilat foot wounds Restrictions Weight Bearing Restrictions: No Vital Signs: Therapy Vitals Temp: 98.4 F (36.9 C) Temp Source: Oral Pulse Rate: 91 Resp: 16 BP: (!) 130/52 Patient Position (if appropriate): Lying Oxygen Therapy SpO2: 99 % O2 Device: Room Air Pain: 0/10   Therapy/Group: Individual Therapy  AuLorie Phenix0/05/2018, 4:49 PM

## 2018-05-07 NOTE — Progress Notes (Signed)
Physical Therapy Session Note  Patient Details  Name: Cynthia Ray MRN: [id number] Date of Birth: [birth year]  Today's Date: 05/07/2018 PT Individual Time: 1000-1057 PT Individual Time Calculation (min): 57 min   Short Term Goals: Week 1:  PT Short Term Goal 1 (Week 1): Pt will transfer with min assist consistently PT Short Term Goal 2 (Week 1): Pt will ambulate 31' with LRAD and min assist PT Short Term Goal 3 (Week 1): Pt will negotiate 4 steps with 2 rails and min assist PT Short Term Goal 4 (Week 1): Pt will complete standardized balance assessment.   Skilled Therapeutic Interventions/Progress Updates:    no c/o pain.  Session focus on activity tolerance, basic functional mobility, and balance.    Pt transfers throughout session with RW (sit<>stand and stand/pivot) with supervision<>min assist.  Min cues for hand placement and forward weight shift with transfers. W/C propulsion from room to nurses station with min assist to maintain straight pathway, armrests removed for improved access to drive wheels.  Gait training with RW x90' and CGA, pt demos forward flexed posture, decreased hip/knee flexion, and bilateral LE circumduction during swing phase.  Standing balance on firm surface progress to foam surface during clothespin task.  Pt requires supervision with single UE support on RW while standing on firm surface, and min guard with single UE support while standing on foam surface.  High level gait training through cone obstacle course with CGA focus on turning and management of RW.  Nustep x5 minutes at level 1 for LE AAROM and overall activity tolerance.  Pt returned to room at end of session and positioned in recliner with call bell in reach and needs met.   Therapy Documentation Precautions:  Precautions Precautions: Fall Precaution Comments: non-healing bilat foot wounds Restrictions Weight Bearing Restrictions: No    Therapy/Group: Individual Therapy  Michel Santee 05/07/2018, 10:59 AM

## 2018-05-08 ENCOUNTER — Inpatient Hospital Stay (HOSPITAL_COMMUNITY): Payer: Medicare Other | Admitting: Physical Therapy

## 2018-05-08 ENCOUNTER — Inpatient Hospital Stay (HOSPITAL_COMMUNITY): Payer: Medicare Other | Admitting: Occupational Therapy

## 2018-05-08 DIAGNOSIS — I739 Peripheral vascular disease, unspecified: Secondary | ICD-10-CM

## 2018-05-08 DIAGNOSIS — E119 Type 2 diabetes mellitus without complications: Secondary | ICD-10-CM

## 2018-05-08 LAB — GLUCOSE, CAPILLARY
Glucose-Capillary: 145 mg/dL — ABNORMAL HIGH (ref 70–99)
Glucose-Capillary: 173 mg/dL — ABNORMAL HIGH (ref 70–99)
Glucose-Capillary: 185 mg/dL — ABNORMAL HIGH (ref 70–99)
Glucose-Capillary: 154 mg/dL — ABNORMAL HIGH (ref 70–99)

## 2018-05-08 MED ORDER — BISACODYL 10 MG RE SUPP
10.0000 mg | Freq: Every day | RECTAL | Status: DC | PRN
  Administered 2018-05-08: 10 mg via RECTAL
  Filled 2018-05-08: qty 1

## 2018-05-08 NOTE — Progress Notes (Addendum)
[place] PHYSICAL MEDICINE & REHABILITATION PROGRESS NOTE   Subjective/Complaints:  Patient eating lunch.  Discussed with RN who is in the room, no new concerns.  Lower extremity swelling has improved.   ROS: Patient denies fever, rash, sore throat, blurred vision, nausea, vomiting, diarrhea, cough, shortness of breath or chest pain, joint or back pain, headache, or mood change.   Objective:   No results found. Recent Labs    05/05/18 2130 05/06/18 0456  WBC 18.2* 11.7*  HGB 12.1 11.0*  HCT 35.8* 33.5*  PLT 145* 111*   Recent Labs    05/05/18 2130 05/06/18 0456  NA 131* 133*  K 4.4 4.0  CL 99 101  CO2 23 24  GLUCOSE 313* 208*  BUN 22 21  CREATININE 1.29* 1.24*  CALCIUM 7.8* 7.7*    Intake/Output Summary (Last 24 hours) at 05/08/2018 1247 Last data filed at 05/08/2018 0800 Gross per 24 hour  Intake 1100 ml  Output -  Net 1100 ml     Physical Exam: Vital Signs Blood pressure (!) 135/51, pulse 96, temperature 98.3 F (36.8 C), temperature source Oral, resp. rate 18, height 5' (1.524 m), weight 60.6 kg, SpO2 96 %.  Constitutional: No distress . Vital signs reviewed. HEENT: EOMI, oral membranes moist Neck: supple Cardiovascular: RRR without murmur. No JVD    Respiratory: CTA Bilaterally without wheezes or rales. Normal effort    GI: BS +, non-tender, non-distended   Musculoskeletal:  LLE edema and tenderness. Left heel cord still tight Neurological: alert and appropriate Oriented to person place and time.   Motor: LUE 2+ prox to 4+ distally Right upper extremity: 5 proximal distal Left lower extremity: Flexion, knee extension and ankle dorsiflexion 2- to 2/5 and limited by pain Right lower extremity: Hip flexion, knee extension 4-/5   dorsiflexion 4/5  Skin:  LLE incisions clean with minimal drainage.  Psychiatric: She has a normal mood and affect. Her behavior is normal.    Assessment/Plan: 1. Functional deficits secondary to debility, PAD which  require 3+ hours per day of interdisciplinary therapy in a comprehensive inpatient rehab setting.  Physiatrist is providing close team supervision and 24 hour management of active medical problems listed below.  Physiatrist and rehab team continue to assess barriers to discharge/monitor patient progress toward functional and medical goals  Care Tool:  Bathing    Body parts bathed by patient: Right arm, Left arm, Chest, Abdomen, Front perineal area, Right upper leg, Left upper leg, Face   Body parts bathed by helper: Buttocks Body parts n/a: Right lower leg, Left lower leg   Bathing assist Assist Level: Minimal Assistance - Patient > 75%     Upper Body Dressing/Undressing Upper body dressing   What is the patient wearing?: Pull over shirt(2 pullover shirts)    Upper body assist Assist Level: Minimal Assistance - Patient > 75%    Lower Body Dressing/Undressing Lower body dressing      What is the patient wearing?: Underwear/pull up, Pants     Lower body assist Assist for lower body dressing: Moderate Assistance - Patient 50 - 74%(with reacher)     Toileting Toileting    Toileting assist Assist for toileting: Minimal Assistance - Patient > 75%     Transfers Chair/bed transfer  Transfers assist     Chair/bed transfer assist level: Contact Guard/Touching assist     Locomotion Ambulation   Ambulation assist      Assist level: Contact Guard/Touching assist Assistive device: Walker-rolling Max distance: 90  Walk 10 feet activity   Assist     Assist level: Contact Guard/Touching assist     Walk 50 feet activity   Assist Walk 50 feet with 2 turns activity did not occur: Safety/medical concerns  Assist level: Contact Guard/Touching assist      Walk 150 feet activity   Assist Walk 150 feet activity did not occur: Safety/medical concerns         Walk 10 feet on uneven surface  activity   Assist     Assist level: Minimal Assistance -  Patient > 75% Assistive device: Aeronautical engineer Will patient use wheelchair at discharge?: (tbd) Type of Wheelchair: Manual    Wheelchair assist level: Minimal Assistance - Patient > 75% Max wheelchair distance: 75    Wheelchair 50 feet with 2 turns activity    Assist        Assist Level: Minimal Assistance - Patient > 75%   Wheelchair 150 feet activity     Assist Wheelchair 150 feet activity did not occur: Safety/medical concerns          Medical Problem List and Plan: 1.  Debility secondary to peripheral vascular disease status post enterectomy left external iliac common femoral superficial artery, left femoral to anterior tibial artery bypass 05/03/2018.  -Continue CIR therapies including PT, OT  2.  DVT Prophylaxis/Anticoagulation: Subcutaneous Lovenox monitor for any bleeding episodes 3. Pain Management: Oxycodone/Ultram as needed  -pain well controlled at present 4. Mood: Provide emotional support 5. Neuropsych: This patient is capable of making decisions on her own behalf. 6. Skin/Wound Care: Routine skin checks, local wound care 7. Fluids/Electrolytes/Nutrition: encourage PO 8.  Leukocytosis/acute on chronic anemia.     Continue iron supplement  -wbc's down to 11.7 10/10, afebrile 9.  CKD.  Creatinine baseline 1.25.    10.  Hypertension.  Lisinopril 40 mg daily, Norvasc 10 mg daily, Lopressor 25 mg twice daily Vitals:   05/08/18 0409 05/08/18 0805  BP: (!) 135/51   Pulse: 93 96  Resp: 18   Temp: 98.3 F (36.8 C)   SpO2: 96%   Controlled 05/08/2018 11.  Diastolic congestive heart failure.  Monitor for any signs of fluid overload.  Continue Lasix   Filed Weights   05/05/18 1825  Weight: 60.6 kg    12.  Diabetes mellitus with peripheral neuropathy.  Hemoglobin A1c 7.8.  Glucotrol 5 mg twice daily, NovoLog 3 units 3 times daily with meals.    -borderline to fair control at present 10/10   CBG (last 3)  Recent Labs     05/07/18 2108 05/08/18 0618 05/08/18 1152  GLUCAP 151* 145* 173*  Controlled 05/08/2018 13.  Hyperlipidemia.  Lipitor 14.  CAD with stenting.  Will discuss with vascular surgery on resuming Plavix as prior to admission.   LOS: 3 days A FACE TO FACE EVALUATION WAS PERFORMED  Charlett Blake 05/08/2018, 12:47 PM

## 2018-05-08 NOTE — Progress Notes (Signed)
Occupational Therapy Session Note  Patient Details  Name: Cynthia Ray MRN: [id number] Date of Birth: 1925-10-10  Today's Date: 05/08/2018 OT Individual Time: 3086-5784 OT Individual Time Calculation (min): 70 min   Short Term Goals: Week 1:  OT Short Term Goal 1 (Week 1): Pt will perform LB dressing with min A. OT Short Term Goal 2 (Week 1): Pt will perform toileting with min A OT Short Term Goal 3 (Week 1): Pt will perform toilet transfer with min A and use of RW.  Skilled Therapeutic Interventions/Progress Updates:    Pt greeted in bed with no c/o pain. All functional transfers completed with RW and Min A with vcs to scoot forward prior to sit<stands. She completed bathing while standing at sink 75% of time with steady assist. Assist required for posterior perihygiene. Afterwards, worked on AE training during LB dressing tasks. Pt used reacher and sock aide with increased assist for Lt foot when donning socks. She would benefit from trial of extra large sock aide to accommodate Lt foot bandage. Sit<stand with steady assist to elevate LB garments over hips. Able to complete 50% of this step herself. Next she completed oral care/grooming tasks w/c level with setup and increased time. At end of session pt opted to remain in w/c. She was left with all needs with safety belt fastened.   Therapy Documentation Precautions:  Precautions Precautions: Fall Precaution Comments: non-healing bilat foot wounds Restrictions Weight Bearing Restrictions: No Pain: No c/o pain during session    ADL:     Therapy/Group: Individual Therapy  Boris Engelmann A Wrenley Sayed 05/08/2018, 12:38 PM

## 2018-05-08 NOTE — Progress Notes (Signed)
Physical Therapy Session Note  Patient Details  Name: Cynthia Ray MRN: [id number] Date of Birth: [birth year]  Today's Date: 05/08/2018 PT Individual Time: 1300-1400 AND 1610-1705 PT Individual Time Calculation (min): 60 min AND 55 min   Short Term Goals: Week 1:  PT Short Term Goal 1 (Week 1): Pt will transfer with min assist consistently PT Short Term Goal 2 (Week 1): Pt will ambulate 67' with LRAD and min assist PT Short Term Goal 3 (Week 1): Pt will negotiate 4 steps with 2 rails and min assist PT Short Term Goal 4 (Week 1): Pt will complete standardized balance assessment.   Skilled Therapeutic Interventions/Progress Updates:   Pt received sitting in WC and agreeable to PT. Pt transported to rehab gym in [facility]. Standing balance training to toss ball off trampoline 2 x 12 with min assist from PT to prevent posterior LOB. Seated therex instructed by PT: LAQ, hip flexion, hip abduction, ankle DF, ankle PF, each completed x 12 with rest break following each due to fatigue. Gait training instructed by PT x 76f with min assist and UE support on RW. Min cues for improved posture and improved symmetry of step length. Gait traiing also performed over unlevel surface with RW and min assist from PT for safety. Patient returned to room and left sitting in recliner with call bell in reach and all needs met.    Session 2.   Pt received sitting in recliner and agreeable to PT. [facility] for improved safety and cues for AD management. Pt transported to day room standing tolerance training with lateral reach to retrieve horse shoe and toss to stake at 6 ft. Min assist from PT to improve lateral weight shift and ROM on the LUE. Ambulatory transfer to Nustep x 212fwith min assist and RW. nustep reciprocal movement and endurance training, level 3, 5 min +3 min with min cues for ROM within pain free range. [facility] for safety and RW. WC  mobility x [id number] supervision assist from PT and min cues for equal force through BUE. Patient returned to room and [facility] reach and all needs met.            Therapy Documentation Precautions:  Precautions Precautions: Fall Precaution Comments: non-healing bilat foot wounds Restrictions Weight Bearing Restrictions: No Vital Signs: Therapy Vitals Temp: 98 F (36.7 C) Temp Source: Oral Pulse Rate: 89 Resp: 20 BP: (!) 123/51 Patient Position (if appropriate): Sitting Oxygen Therapy SpO2: 99 % O2 Device: Room Air Pain: Pain Assessment Pain Scale: 0-10 Pain Score: 0-No pain    Therapy/Group: Individual Therapy  AusLorie Phenix/06/2018, 5:18 PM

## 2018-05-09 ENCOUNTER — Inpatient Hospital Stay (HOSPITAL_COMMUNITY): Payer: Medicare Other | Admitting: Occupational Therapy

## 2018-05-09 LAB — GLUCOSE, CAPILLARY
Glucose-Capillary: 162 mg/dL — ABNORMAL HIGH (ref 70–99)
Glucose-Capillary: 139 mg/dL — ABNORMAL HIGH (ref 70–99)
Glucose-Capillary: 154 mg/dL — ABNORMAL HIGH (ref 70–99)
Glucose-Capillary: 183 mg/dL — ABNORMAL HIGH (ref 70–99)

## 2018-05-09 NOTE — Plan of Care (Signed)
  Problem: RH SKIN INTEGRITY Goal: RH STG SKIN FREE OF INFECTION/BREAKDOWN Description With min assist  Outcome: Progressing Goal: RH STG ABLE TO PERFORM INCISION/WOUND CARE W/ASSISTANCE Description STG Able To Perform Incision/Wound Care With Assistance Min.  Outcome: Progressing   Problem: RH SAFETY Goal: RH STG ADHERE TO SAFETY PRECAUTIONS W/ASSISTANCE/DEVICE Description STG Adhere to Safety Precautions With Assistance/Device. Mod I  Outcome: Progressing   Problem: RH PAIN MANAGEMENT Goal: RH STG PAIN MANAGED AT OR BELOW PT'S PAIN GOAL Description Less than 3   Outcome: Progressing

## 2018-05-09 NOTE — Progress Notes (Signed)
[place] PHYSICAL MEDICINE & REHABILITATION PROGRESS NOTE   Subjective/Complaints:  Patient resting.  She has no complaints today.   ROS: Patient denies fever, rash, sore throat, blurred vision, nausea, vomiting, diarrhea, cough, shortness of breath or chest pain, joint or back pain, headache, or mood change.   Objective:   No results found. No results for input(s): WBC, HGB, HCT, PLT in the last 72 hours. No results for input(s): NA, K, CL, CO2, GLUCOSE, BUN, CREATININE, CALCIUM in the last 72 hours.  Intake/Output Summary (Last 24 hours) at 05/09/2018 1058 Last data filed at 05/09/2018 0900 Gross per 24 hour  Intake 420 ml  Output -  Net 420 ml     Physical Exam: Vital Signs Blood pressure 110/60, pulse 91, temperature 98 F (36.7 C), temperature source Oral, resp. rate 18, height 5' (1.524 m), weight 60.6 kg, SpO2 95 %.  Constitutional: No distress . Vital signs reviewed. HEENT: EOMI, oral membranes moist Neck: supple Cardiovascular: RRR without murmur. No JVD    Respiratory: CTA Bilaterally without wheezes or rales. Normal effort    GI: BS +, non-tender, non-distended   Musculoskeletal:  LLE edema and tenderness. Left heel cord still tight Neurological: alert and appropriate Oriented to person place and time.   Motor: LUE 2+ prox to 4+ distally Right upper extremity: 5 proximal distal Left lower extremity: Flexion, knee extension and ankle dorsiflexion 2- to 2/5 and limited by pain Right lower extremity: Hip flexion, knee extension 4-/5   dorsiflexion 4/5  Skin:  LLE incisions clean with minimal drainage.  Psychiatric: She has a normal mood and affect. Her behavior is normal.    Assessment/Plan: 1. Functional deficits secondary to debility, PAD which require 3+ hours per day of interdisciplinary therapy in a comprehensive inpatient rehab setting.  Physiatrist is providing close team supervision and 24 hour management of active medical problems listed  below.  Physiatrist and rehab team continue to assess barriers to discharge/monitor patient progress toward functional and medical goals  Care Tool:  Bathing    Body parts bathed by patient: Right arm, Left arm, Chest, Abdomen, Front perineal area, Right upper leg, Left upper leg, Face   Body parts bathed by helper: Buttocks Body parts n/a: Right lower leg, Left lower leg   Bathing assist Assist Level: Minimal Assistance - Patient > 75%     Upper Body Dressing/Undressing Upper body dressing   What is the patient wearing?: Pull over shirt(2 pullover shirts)    Upper body assist Assist Level: Minimal Assistance - Patient > 75%    Lower Body Dressing/Undressing Lower body dressing      What is the patient wearing?: Underwear/pull up, Pants     Lower body assist Assist for lower body dressing: Moderate Assistance - Patient 50 - 74%(with reacher)     Toileting Toileting    Toileting assist Assist for toileting: Minimal Assistance - Patient > 75%     Transfers Chair/bed transfer  Transfers assist     Chair/bed transfer assist level: Minimal Assistance - Patient > 75%     Locomotion Ambulation   Ambulation assist      Assist level: Minimal Assistance - Patient > 75% Assistive device: Walker-rolling Max distance: 90   Walk 10 feet activity   Assist     Assist level: Minimal Assistance - Patient > 75% Assistive device: Walker-rolling   Walk 50 feet activity   Assist Walk 50 feet with 2 turns activity did not occur: Safety/medical concerns  Assist level: Minimal  Assistance - Patient > 75% Assistive device: Walker-rolling    Walk 150 feet activity   Assist Walk 150 feet activity did not occur: Safety/medical concerns         Walk 10 feet on uneven surface  activity   Assist     Assist level: Minimal Assistance - Patient > 75% Assistive device: Aeronautical engineer Will patient use wheelchair at discharge?:  (tbd) Type of Wheelchair: Manual    Wheelchair assist level: Supervision/Verbal cueing Max wheelchair distance: 126ft    Wheelchair 50 feet with 2 turns activity    Assist        Assist Level: Supervision/Verbal cueing   Wheelchair 150 feet activity     Assist Wheelchair 150 feet activity did not occur: Safety/medical concerns          Medical Problem List and Plan: 1.  Debility secondary to peripheral vascular disease status post enterectomy left external iliac common femoral superficial artery, left femoral to anterior tibial artery bypass 05/03/2018.  -Continue CIR therapies including PT, OT tolerating therapy 2.  DVT Prophylaxis/Anticoagulation: Subcutaneous Lovenox monitor for any bleeding episodes 3. Pain Management: Oxycodone/Ultram as needed  -pain well controlled 10/13 4. Mood: Provide emotional support 5. Neuropsych: This patient is capable of making decisions on her own behalf. 6. Skin/Wound Care: Routine skin checks, local wound care 7. Fluids/Electrolytes/Nutrition: encourage PO 8.  Leukocytosis/acute on chronic anemia.     Continue iron supplement  -wbc's down to 11.7 10/10, afebrile 9.  CKD.  Creatinine baseline 1.25.    10.  Hypertension.  Lisinopril 40 mg daily, Norvasc 10 mg daily, Lopressor 25 mg twice daily Vitals:   05/08/18 1934 05/09/18 0411  BP: (!) 133/50 110/60  Pulse: 96 91  Resp: 18 18  Temp: 98.6 F (37 C) 98 F (36.7 C)  SpO2: 99% 95%  Controlled 05/09/2018 11.  Diastolic congestive heart failure.  Monitor for any signs of fluid overload.  Continue Lasix   Filed Weights   05/05/18 1825  Weight: 60.6 kg    12.  Diabetes mellitus with peripheral neuropathy.  Hemoglobin A1c 7.8.  Glucotrol 5 mg twice daily, NovoLog 3 units 3 times daily with meals.    -borderline to fair control at present 10/10   CBG (last 3)  Recent Labs    05/08/18 1709 05/08/18 2115 05/09/18 0622  GLUCAP 185* 154* 162*  fairly well Controlled  05/09/2018 13.  Hyperlipidemia.  Lipitor 14.  CAD with stenting.  Will discuss with vascular surgery on resuming Plavix as prior to admission.   LOS: 4 days A FACE TO [place] E Demaris Bousquet 05/09/2018, 10:58 AM

## 2018-05-09 NOTE — Progress Notes (Signed)
Occupational Therapy Session Note  Patient Details  Name: Cynthia Ray MRN: [id number] Date of Birth: 1926/05/07  Today's Date: 05/09/2018 OT Individual Time: 1410-1505 OT Individual Time Calculation (min): 55 min   Short Term Goals: Week 1:  OT Short Term Goal 1 (Week 1): Pt will perform LB dressing with min A. OT Short Term Goal 2 (Week 1): Pt will perform toileting with min A OT Short Term Goal 3 (Week 1): Pt will perform toilet transfer with min A and use of RW.  Skilled Therapeutic Interventions/Progress Updates:    Pt greeted in bed with no c/o pain. ADL needs met. Stand pivot<w/c with steady assist using RW. Escorted pt to gift shop via w/c. Pt opted to self propel vs ambulate. Worked on UB strengthening, endurance, and UE ROM during IADL task of shopping. Active assist provided at both elbows when reaching for greeting cards and large/small stuffed animals above 80 degrees shoulder flexion. She really enjoyed turning on the animated stuffed animals as well as reading cards with cats on them. Pt negotiated around environmental barriers with increased time and min vcs for obstacle clearance. At end of session pt was returned to unit and ambulated short distance back to bed using RW with steady assist. Pt returned to supine and was left with all needs within reach and bed alarm set.   Therapy Documentation Precautions:  Precautions Precautions: Fall Precaution Comments: non-healing bilat foot wounds Restrictions Weight Bearing Restrictions: No Vital Signs: Therapy Vitals Temp: 98.2 F (36.8 C) Pulse Rate: 89 Resp: 18 BP: (!) 121/48 Patient Position (if appropriate): Lying Oxygen Therapy SpO2: 97 % O2 Device: Room Air Pain: Pt denied pain during tx  Pain Assessment Pain Scale: 0-10 Pain Score: 0-No pain ADL:       Therapy/Group: Individual Therapy  [name] [name] 05/09/2018, 4:42 PM

## 2018-05-10 ENCOUNTER — Inpatient Hospital Stay (HOSPITAL_COMMUNITY): Payer: Medicare Other | Admitting: Physical Therapy

## 2018-05-10 ENCOUNTER — Inpatient Hospital Stay (HOSPITAL_COMMUNITY): Payer: Medicare Other | Admitting: Occupational Therapy

## 2018-05-10 LAB — GLUCOSE, CAPILLARY
Glucose-Capillary: 173 mg/dL — ABNORMAL HIGH (ref 70–99)
Glucose-Capillary: 200 mg/dL — ABNORMAL HIGH (ref 70–99)
Glucose-Capillary: 139 mg/dL — ABNORMAL HIGH (ref 70–99)

## 2018-05-10 NOTE — Progress Notes (Signed)
[facility] PHYSICAL MEDICINE & REHABILITATION PROGRESS NOTE   Subjective/Complaints:  Pt without complaints. Happy she moved bowels over weekend.  ROS: Patient denies fever, rash, sore throat, blurred vision, nausea, vomiting, diarrhea, cough, shortness of breath or chest pain, joint or back pain, headache, or mood change.    Objective:   No results found. No results for input(s): WBC, HGB, HCT, PLT in the last 72 hours. No results for input(s): NA, K, CL, CO2, GLUCOSE, BUN, CREATININE, CALCIUM in the last 72 hours.  Intake/Output Summary (Last 24 hours) at 05/10/2018 0858 Last data filed at 05/09/2018 2245 Gross per 24 hour  Intake 480 ml  Output -  Net 480 ml     Physical Exam: Vital Signs Blood pressure (!) 144/55, pulse 100, temperature 98.3 F (36.8 C), temperature source Oral, resp. rate 18, height 5' (1.524 m), weight 60.6 kg, SpO2 96 %.  Constitutional: No distress . Vital signs reviewed. HEENT: EOMI, oral membranes moist Neck: supple Cardiovascular: RRR without murmur. No JVD    Respiratory: CTA Bilaterally without wheezes or rales. Normal effort    GI: BS +, non-tender, non-distended  Musculoskeletal:  LLE edema and tenderness. Left heel cord remains tight Neurological: alert and appropriate Oriented to person place and time.   Motor: LUE 2+ prox to 4+ distally Right upper extremity: 5 proximal distal Left lower extremity: Flexion, knee extension and ankle dorsiflexion 2- to 2/5 and limited by pain, swelling Right lower extremity: Hip flexion, knee extension 4-/5   dorsiflexion 4/5  Skin:  LLE incisions clean with minimal drainage, dressed Psychiatric: She has a normal mood and affect. Her behavior is normal.    Assessment/Plan: 1. Functional deficits secondary to debility, PAD which require 3+ hours per day of interdisciplinary therapy in a comprehensive inpatient rehab setting.  Physiatrist is providing close team supervision and 24 hour management of  active medical problems listed below.  Physiatrist and rehab team continue to assess barriers to discharge/monitor patient progress toward functional and medical goals  Care Tool:  Bathing    Body parts bathed by patient: Right arm, Left arm, Chest, Abdomen, Front perineal area, Right upper leg, Left upper leg, Face   Body parts bathed by helper: Buttocks Body parts n/a: Right lower leg, Left lower leg   Bathing assist Assist Level: Minimal Assistance - Patient > 75%     Upper Body Dressing/Undressing Upper body dressing   What is the patient wearing?: Pull over shirt(2 pullover shirts)    Upper body assist Assist Level: Minimal Assistance - Patient > 75%    Lower Body Dressing/Undressing Lower body dressing      What is the patient wearing?: Underwear/pull up, Pants     Lower body assist Assist for lower body dressing: Moderate Assistance - Patient 50 - 74%(with reacher)     Toileting Toileting    Toileting assist Assist for toileting: Minimal Assistance - Patient > 75%     Transfers Chair/bed transfer  Transfers assist     Chair/bed transfer assist level: Contact Guard/Touching assist     Locomotion Ambulation   Ambulation assist      Assist level: Minimal Assistance - Patient > 75% Assistive device: Walker-rolling Max distance: 90   Walk 10 feet activity   Assist     Assist level: Minimal Assistance - Patient > 75% Assistive device: Walker-rolling   Walk 50 feet activity   Assist Walk 50 feet with 2 turns activity did not occur: Safety/medical concerns  Assist level: Minimal Assistance -  Patient > 75% Assistive device: Walker-rolling    Walk 150 feet activity   Assist Walk 150 feet activity did not occur: Safety/medical concerns         Walk 10 feet on uneven surface  activity   Assist     Assist level: Minimal Assistance - Patient > 75% Assistive device: Aeronautical engineer Will patient use  wheelchair at discharge?: (tbd) Type of Wheelchair: Manual    Wheelchair assist level: Supervision/Verbal cueing Max wheelchair distance: 170ft    Wheelchair 50 feet with 2 turns activity    Assist        Assist Level: Supervision/Verbal cueing   Wheelchair 150 feet activity     Assist Wheelchair 150 feet activity did not occur: Safety/medical concerns          Medical Problem List and Plan: 1.  Debility secondary to peripheral vascular disease status post enterectomy left external iliac common femoral superficial artery, left femoral to anterior tibial artery bypass 05/03/2018.  -Continue CIR therapies including PT, OT tolerating therapy 2.  DVT Prophylaxis/Anticoagulation: Subcutaneous Lovenox monitor for any bleeding episodes 3. Pain Management: Oxycodone/Ultram as needed  -pain well controlled 10/14 4. Mood: Provide emotional support 5. Neuropsych: This patient is capable of making decisions on her own behalf. 6. Skin/Wound Care: Routine skin checks, local wound care 7. Fluids/Electrolytes/Nutrition: encourage PO 8.  Leukocytosis/acute on chronic anemia.     Continue iron supplement  -wbc's down to 11.7 10/10, afebrile  9.  CKD.  Creatinine baseline 1.25.    10.  Hypertension.  Lisinopril 40 mg daily, Norvasc 10 mg daily, Lopressor 25 mg twice daily Vitals:   05/10/18 0327 05/10/18 0743  BP: (!) 144/55   Pulse: 87 100  Resp: 18   Temp: 98.3 F (36.8 C)   SpO2: 96%   Controlled 05/10/2018 11.  Diastolic congestive heart failure.  Monitor for any signs of fluid overload.  Continue Lasix   Filed Weights   05/05/18 1825  Weight: 60.6 kg    -need daily weights 12.  Diabetes mellitus with peripheral neuropathy.  Hemoglobin A1c 7.8.  Glucotrol 5 mg twice daily, NovoLog 3 units 3 times daily with meals.    -borderline to fair control at present 10/10   CBG (last 3)  Recent Labs    05/09/18 1652 05/09/18 2122 05/10/18 0637  GLUCAP 154* 183* 173*   borderline Controlled 05/10/2018 13.  Hyperlipidemia.  Lipitor 14.  CAD with stenting.  Will discuss with vascular surgery on resuming Plavix as prior to admission.   LOS: 5 days A FACE TO FACE EVALUATION WAS PERFORMED  Meredith Staggers 05/10/2018, 8:58 AM

## 2018-05-10 NOTE — Progress Notes (Signed)
Physical Therapy Session Note  Patient Details  Name: Cynthia Ray MRN: [id number] Date of Birth: 1926-05-31  Today's Date: 05/10/2018 PT Individual Time: 1000-1100 PT Individual Time Calculation (min): 60 min   Short Term Goals: Week 1:  PT Short Term Goal 1 (Week 1): Pt will transfer with min assist consistently PT Short Term Goal 2 (Week 1): Pt will ambulate 28' with LRAD and min assist PT Short Term Goal 3 (Week 1): Pt will negotiate 4 steps with 2 rails and min assist PT Short Term Goal 4 (Week 1): Pt will complete standardized balance assessment.   Skilled Therapeutic Interventions/Progress Updates:    no c/o pain.  Session focus on activity tolerance, postural re-education, and balance.    Pt propels w/c x50' with BUEs for strengthening and cardiovascular endurance.  Sit<>stand throughout session on firm surface with RW with CGA, cues for forward weight shift.  Standing on foam wedge for ankle/hip strategy and anterior weight shift, 2 trials x60 seconds each, initially mod assist to gain balance on first trial, progress to min assist with mod cues for posture on second trial.  Standing 2 trials on airex pad during card matching task for ankle strategy with min guard for balance.  Gait training x90' with RW, CGA fade to supervision with distance.  Pt demos improved upright posture (though still forward flexed overall) and equal step length.  High level gait through obstacle course focus on navigating around/over obstacles x4 trials with CGA fade to supervision.  Discussed d/c planning with pt and daughter, daughter adamant that 24/7 supervision cannot be provided.  Unsure whether pt will be able to meet mod I goals due to cuing required.  Will continue discussions with pt and family.  Returned to room at end of session and positioned in w/c with call bell in reach and family present at bedside.   Therapy Documentation Precautions:  Precautions Precautions: Fall Precaution Comments:  non-healing bilat foot wounds Restrictions Weight Bearing Restrictions: No    Therapy/Group: Individual Therapy  Michel Santee 05/10/2018, 11:06 AM

## 2018-05-10 NOTE — Plan of Care (Signed)
  Problem: RH SKIN INTEGRITY Goal: RH STG SKIN FREE OF INFECTION/BREAKDOWN Description With min assist  Outcome: Progressing Goal: RH STG ABLE TO PERFORM INCISION/WOUND CARE W/ASSISTANCE Description STG Able To Perform Incision/Wound Care With Assistance Min.  Outcome: Progressing   Problem: RH SAFETY Goal: RH STG ADHERE TO SAFETY PRECAUTIONS W/ASSISTANCE/DEVICE Description STG Adhere to Safety Precautions With Assistance/Device. Mod I  Outcome: Progressing   Problem: RH PAIN MANAGEMENT Goal: RH STG PAIN MANAGED AT OR BELOW PT'S PAIN GOAL Description Less than 3   Outcome: Progressing

## 2018-05-10 NOTE — Progress Notes (Signed)
Occupational Therapy Session Note  Patient Details  Name: Cynthia Ray MRN: [id number] Date of Birth: 1926/02/14  Today's Date: 05/10/2018 OT Individual Time: 0300-9233 OT Individual Time Calculation (min): 73 min    Short Term Goals: Week 1:  OT Short Term Goal 1 (Week 1): Pt will perform LB dressing with min A. OT Short Term Goal 2 (Week 1): Pt will perform toileting with min A OT Short Term Goal 3 (Week 1): Pt will perform toilet transfer with min A and use of RW.  Skilled Therapeutic Interventions/Progress Updates:    Upon entering the room, pt supine in bed awaiting OT arrival with no c/o pain this session. Pt declined shower this session but agreeable to bathing from sink level. Pt performed supine >sit with min A to EOB. Pt needing lifting assistance to stand from bed and ambulate 10' with RW and min A into bathroom. Pt sitting on toilet and able to void with min A for standing balance as pt performed hygiene. Pt utilized reacher to thread clothing onto B LEs and pulled over B hips with min A for standing balance. Pt ambulating to sink for UB bathing while seated with set up A. Pt propping UEs onto sink to wash underarms. Pt engaged in B UE strengthening exercises with alternating punches, bicep curls, and straight arm raises with AROM against gravity. Pt taking rest breaks secondary to fatigue with reps of 5. Min verbal cuing for pursed lip breathing.   Therapy Documentation Precautions:  Precautions Precautions: Fall Precaution Comments: non-healing bilat foot wounds Restrictions Weight Bearing Restrictions: No Other Treatments:     Therapy/Group: Individual Therapy  Gypsy Decant 05/10/2018, 12:53 PM

## 2018-05-10 NOTE — Progress Notes (Signed)
Physical Therapy Session Note  Patient Details  Name: FARON WHITELOCK MRN: [id number] Date of Birth: [birth year]  Today's Date: 05/10/2018 PT Individual Time: 8891-6945 PT Individual Time Calculation (min): 60 min   Short Term Goals: Week 1:  PT Short Term Goal 1 (Week 1): Pt will transfer with min assist consistently PT Short Term Goal 2 (Week 1): Pt will ambulate 93' with LRAD and min assist PT Short Term Goal 3 (Week 1): Pt will negotiate 4 steps with 2 rails and min assist PT Short Term Goal 4 (Week 1): Pt will complete standardized balance assessment.   Skilled Therapeutic Interventions/Progress Updates:    no c/o pain.  Session focus on strengthening.    Pt requires mod assist to stand from recliner due to recliner sliding on floor despite brakes activated.  Gait training with close supervision<>CGA and RW x120' with min cues for walker positioning.    BLE strengthening as follows: 3x5 hip flexion alternating 2x10 LAQ alternating  2x10 isometric hip adduction  BUE strengthening as follows: 2x10 reps towel pushes along incline  W/c propulsion x50' with BUEs and cues for more efficient LUE technique.  Pt completes ambulatory transfer back to bed at end of session with RW.  Mod assist for sit>supine. Call bell in reach and needs met.    Therapy Documentation Precautions:  Precautions Precautions: Fall Precaution Comments: non-healing bilat foot wounds Restrictions Weight Bearing Restrictions: No   Therapy/Group: Individual Therapy  Michel Santee 05/10/2018, 2:35 PM

## 2018-05-11 ENCOUNTER — Inpatient Hospital Stay (HOSPITAL_COMMUNITY): Payer: Medicare Other | Admitting: Occupational Therapy

## 2018-05-11 ENCOUNTER — Inpatient Hospital Stay (HOSPITAL_COMMUNITY): Payer: Medicare Other | Admitting: Physical Therapy

## 2018-05-11 LAB — GLUCOSE, CAPILLARY
Glucose-Capillary: 80 mg/dL (ref 70–99)
Glucose-Capillary: 155 mg/dL — ABNORMAL HIGH (ref 70–99)
Glucose-Capillary: 173 mg/dL — ABNORMAL HIGH (ref 70–99)

## 2018-05-11 NOTE — Progress Notes (Signed)
Occupational Therapy Session Note  Patient Details  Name: Cynthia Ray MRN: [id number] Date of Birth: Nov 26, 1925  Today's Date: 05/11/2018 OT Individual Time: 4098-1191 and 1530-1600 OT Individual Time Calculation (min): 70 min and 30 min    Short Term Goals: Week 1:  OT Short Term Goal 1 (Week 1): Pt will perform LB dressing with min A. OT Short Term Goal 2 (Week 1): Pt will perform toileting with min A OT Short Term Goal 3 (Week 1): Pt will perform toilet transfer with min A and use of RW.  Skilled Therapeutic Interventions/Progress Updates:    Session 1: Upon entering the room, pt supine in bed with no c/o pain and requesting to use bathroom. Supine >sit with supervision. Sit >stand from bed with mod lifting assistance and pt ambulating 10' into bathroom with steady assistance progressing to supervision. Pt able to manage clothing items and hygiene with steady assistance for standing balance. Pt returning to sit at sink for grooming tasks with set up assist to obtain all needed items. Pt engaged in sit <>stand from wheelchair with min A progressing to supervision as session goes on for dressing and bathing tasks. Pt needing multiple rest breaks secondary to fatigue. Pt transferred into wheelchair with close supervision and use of RW. Call bell and all needed items within reach upon exiting the room.   Session 2: Upon entering the room, pt returning to bed with NT from toileting and very fatigued. Pt declined OOB activities this session secondary to fatigue. OT discussed upcoming discharge date, recommendations, explained 24/7 supervision for home safety, and made plans with pt for next therapy session. OT also providing education regarding energy conservation at home for safety. Pt remained in bed with call bell and all needed items within reach.   Therapy Documentation Precautions:  Precautions Precautions: Fall Precaution Comments: non-healing bilat foot wounds Restrictions Weight  Bearing Restrictions: No     Therapy/Group: Individual Therapy  Gypsy Decant 05/11/2018, 12:53 PM

## 2018-05-11 NOTE — Patient Care Conference (Signed)
Inpatient RehabilitationTeam Conference and Plan of Care Update Date: 05/11/2018   Time: 2:10 PM    Patient Name: Cynthia Ray      Medical Record Number: [id number]  Date of Birth: 08-08-1925 Sex: Female         Room/Bed: 4W03C/4W03C-01 Payor Info: Payor: MEDICARE / Plan: MEDICARE PART A AND B / Product Type: *No Product type* /    Admitting Diagnosis: Debo;otu  Admit Date/Time:  05/05/2018  6:09 PM Admission Comments: No comment available   Primary Diagnosis:  <principal problem not specified> Principal Problem: <principal problem not specified>  Patient Active Problem List   Diagnosis Date Noted  . Debility 05/05/2018  . Leukocytosis   . Chronic combined systolic and diastolic congestive heart failure ([place])   . CKD (chronic kidney disease), stage III ([place])   . Acute on chronic anemia   . Chronic diastolic congestive heart failure ([place])   . Diabetes mellitus type 2 in nonobese ([facility])   . PAD (peripheral artery disease) ([place]) 04/30/2018  . GERD (gastroesophageal reflux disease) 04/30/2018  . Type II diabetes mellitus ([place]) 04/30/2018  . COPD (chronic obstructive pulmonary disease) ([place]) 04/30/2018  . Hypomagnesemia 04/30/2018  . Edema 04/30/2018  . PVD (peripheral vascular disease) ([place]) 03/24/2013  . Aftercare following surgery of the circulatory system, [place] 03/24/2013  . Swelling of limb-Right foot 03/24/2013  . Peripheral vascular disease, unspecified ([place]) 12/23/2012  . Atherosclerosis of native arteries of the extremities with intermittent claudication 06/23/2012  . Atherosclerosis of native arteries of the extremities with ulceration(440.23) 03/11/2012  . CAD (coronary artery disease) 01/20/2012  . Anemia 01/20/2012  . Hyponatremia 01/20/2012  . Pre-operative cardiovascular examination 12/30/2011  . HTN (hypertension) 12/30/2011  . Dyslipidemia 12/30/2011  . Occlusion and stenosis of carotid artery without mention of cerebral infarction 10/16/2011  .  Atherosclerotic PVD with ulceration ([place]) 10/16/2011    Expected Discharge Date: Expected Discharge Date: 05/18/18  Team Members Present: Physician leading conference: Dr. Alger Simons Social Worker Present: Alfonse Alpers, LCSW Nurse Present: Heather Roberts, RN PT Present: Dwyane Dee, PT OT Present: Benay Pillow, OT SLP Present: Charolett Bumpers, SLP PPS Coordinator present : Daiva Nakayama, RN, CRRN     Current Status/Progress Goal Weekly Team Focus  Medical   Patient admitted for functional deficits due to peripheral artery disease status post left lower extremity arterial bypass grafting.  Course complicated by congestive heart failure and other medical considerations  Improve activity tolerance and weightbearing through left leg  Wound care, nutrition, blood pressure control, volume management   Bowel/Bladder   Continent of bowel and bladder: LBM 05/10/18  Remain continent  Assess bowel and bladder needs q shift and prn   Swallow/Nutrition/ Hydration             ADL's   min - mod A for functional transfers (lifting assist), min A for balance in standing, mod A LB self care, min A UB self care  supervision overall - 24/7  functional transfers, balance, strengthening, ADL retraining with use of AE, and endurance.   Mobility   min guard/supervision, min cues for carry over with sequencing  supervision overall, LRAD household mobility  carryover with sequencing/safety for transfers, gait, activity tolerance   Communication             Safety/Cognition/ Behavioral Observations            Pain   No c/o pain  Pain < 2/10  Assess pain q shift and PRN and medicate as necessary  Skin   Ecchymosis to left wrist, forearm; left groin incision, cleaned with saline and covered with 4X4 gauze; left upper thigh, medial tibial, and medial lower leg incisions open to air-some redness, no drainage, heat, or s/s infection; left venous stasis foot ulcer treated with santyl, covered with  foam  No signs of infection or other areas of breakdown  Assess skin q shift and PRN and address any areas of concern immediately    Rehab Goals Patient on target to meet rehab goals: Yes Rehab Goals Revised: none *See Care Plan and progress notes for long and short-term goals.     Barriers to Discharge  Current Status/Progress Possible Resolutions Date Resolved   Physician    Medical stability        See medical progress notes      Nursing                  PT                    OT                  SLP                SW                Discharge Planning/Teaching Needs:  Pt plans to go to her home.  Team is recommending 24/7 supervision.  CSW to discuss this with pt and family.  Family education next week.   Team Discussion:  Pt has some pain with movement following LE bypass graft.  Pt is eating well, trying hard, and doing fairly well.  Wound care is ongoing.  Pt is min guard to supervision, depending on how tired she is.  Team is recommending 24/7 supervision due to need for consistent cueing since she doesn't have carryover.  Pt is min A overall for self care and bathed at sink PTA and won't shower at home.  Pt is stiff in the morning, but does well and has supervision level goals.  Revisions to Treatment Plan:  none    Continued Need for Acute Rehabilitation Level of Care: The patient requires daily medical management by a physician with specialized training in physical medicine and rehabilitation for the following conditions: Daily direction of a multidisciplinary physical rehabilitation program to ensure safe treatment while eliciting the highest outcome that is of practical value to the patient.: Yes Daily medical management of patient stability for increased activity during participation in an intensive rehabilitation regime.: Yes Daily analysis of laboratory values and/or radiology reports with any subsequent need for medication adjustment of medical intervention for :  Post surgical problems;Cardiac problems   I attest that I was present, lead the team conference, and concur with the assessment and plan of the team.   Madeline Pho, Silvestre Mesi 05/11/2018, 2:55 PM

## 2018-05-11 NOTE — Progress Notes (Signed)
Physical Therapy Session Note  Patient Details  Name: Cynthia Ray MRN: [id number] Date of Birth: 05/21/1926  Today's Date: 05/11/2018 PT Individual Time: 1000-1055 PT Individual Time Calculation (min): 55 min   Short Term Goals: Week 1:  PT Short Term Goal 1 (Week 1): Pt will transfer with min assist consistently PT Short Term Goal 2 (Week 1): Pt will ambulate 50' with LRAD and min assist PT Short Term Goal 3 (Week 1): Pt will negotiate 4 steps with 2 rails and min assist PT Short Term Goal 4 (Week 1): Pt will complete standardized balance assessment.   Skilled Therapeutic Interventions/Progress Updates:    no c/o pain.  Session focus on gait training, sit<>stand transfers with RW, and UE strengthening per pt request.  Pt requires rest breaks throughout session 2/2 fatigue.   Pt transfers sit<>stand throughout session with RW and supervision, verbal cues for foot placement and forward weight shift.  Gait training x100' with RW and supervision, min cues for more equal step length.  High level gait training through obstacle course focus on navigation of compliant surfaces, stepping around obstacles/side stepping, and stepping over obstacles.  Pt completes obstacle course 2 trials with rest break in between and supervision, min verbal cues for side stepping.  UE strengthening with ball taps with 1# dowel rod 3 trials to fatigue.  Blocked practice sit<>stand with clothespin task for LUE, 5 trials with min instructional or question cues for foot placement and forward weight shift on each trial.  Pt returned to room at end of session and completes ambulatory transfer with RW and supervision to recliner.  Positioned to comfort with call bell in reach and needs met.   Therapy Documentation Precautions:  Precautions Precautions: Fall Precaution Comments: non-healing bilat foot wounds Restrictions Weight Bearing Restrictions: No   Therapy/Group: Individual Therapy  Caitlin E  Warren 05/11/2018, 10:27 AM  

## 2018-05-11 NOTE — Progress Notes (Signed)
Occupational Therapy Session Note  Patient Details  Name: Cynthia Ray MRN: [id number] Date of Birth: 09-06-1925  Today's Date: 05/11/2018 OT Individual Time: 0930-1000 OT Individual Time Calculation (min): 30 min    Short Term Goals: Week 1:  OT Short Term Goal 1 (Week 1): Pt will perform LB dressing with min A. OT Short Term Goal 2 (Week 1): Pt will perform toileting with min A OT Short Term Goal 3 (Week 1): Pt will perform toilet transfer with min A and use of RW.  Skilled Therapeutic Interventions/Progress Updates:    1:1 Focus on functional ambulation in and out of bathroom with RW with contact guard. Pt performed toileting with contact guard. Pt ambulated to sink to perform hand washing and tooth brushing in standing with at least one UE support with supervision. Pt did request to sit to brush hair. Pt able to don shoes with setup and ambulate another ~25 feet in the hallway. Pass off to next therapist.   Therapy Documentation Precautions:  Precautions Precautions: Fall Precaution Comments: non-healing bilat foot wounds Restrictions Weight Bearing Restrictions: No General:   Vital Signs: Therapy Vitals Temp: 98.1 F (36.7 C) Pulse Rate: 87 Resp: 18 BP: (!) 154/60 Patient Position (if appropriate): Lying Oxygen Therapy SpO2: 100 % O2 Device: Room Air Pain:  no c/o pain in session   Therapy/Group: Individual Therapy  Willeen Cass Zebrowski [facility] 05/11/2018, 4:04 PM

## 2018-05-11 NOTE — Progress Notes (Signed)
[place] PHYSICAL MEDICINE & REHABILITATION PROGRESS NOTE   Subjective/Complaints:  Up with OT at sink. Making progress. Buttocks sore when lying in bed. Likes sitting up better. Good appetite  ROS: Patient denies fever, rash, sore throat, blurred vision, nausea, vomiting, diarrhea, cough, shortness of breath or chest pain, joint or back pain, headache, or mood change.    Objective:   No results found. No results for input(s): WBC, HGB, HCT, PLT in the last 72 hours. No results for input(s): NA, K, CL, CO2, GLUCOSE, BUN, CREATININE, CALCIUM in the last 72 hours.  Intake/Output Summary (Last 24 hours) at 05/11/2018 0909 Last data filed at 05/10/2018 1700 Gross per 24 hour  Intake 300 ml  Output -  Net 300 ml     Physical Exam: Vital Signs Blood pressure (!) 136/52, pulse 84, temperature 98.2 F (36.8 C), resp. rate 18, height 5' (1.524 m), weight 60.4 kg, SpO2 98 %.  Constitutional: No distress . Vital signs reviewed. HEENT: EOMI, oral membranes moist Neck: supple Cardiovascular: RRR without murmur. No JVD    Respiratory: CTA Bilaterally without wheezes or rales. Normal effort    GI: BS +, non-tender, non-distended  Musculoskeletal:  LLE edema and tenderness 1++. Left heel cord a little looser Neurological: alert and appropriate Oriented to person place and time.   Motor: LUE 2+ prox to 4+ distally Right upper extremity: 5 proximal distal Left lower extremity: Flexion, knee extension and ankle dorsiflexion 2- to 2/5 and limited by pain, swelling Right lower extremity: Hip flexion, knee extension 4-/5   dorsiflexion 4/5 --no motor changes Skin:  LLE incisions clean without drainage Psychiatric: She has a normal mood and affect. Her behavior is normal.    Assessment/Plan: 1. Functional deficits secondary to debility, PAD which require 3+ hours per day of interdisciplinary therapy in a comprehensive inpatient rehab setting.  Physiatrist is providing close team  supervision and 24 hour management of active medical problems listed below.  Physiatrist and rehab team continue to assess barriers to discharge/monitor patient progress toward functional and medical goals  Care Tool:  Bathing    Body parts bathed by patient: Right arm, Left arm, Chest, Abdomen, Front perineal area, Right upper leg, Left upper leg, Face   Body parts bathed by helper: Buttocks Body parts n/a: Right lower leg, Left lower leg   Bathing assist Assist Level: Minimal Assistance - Patient > 75%     Upper Body Dressing/Undressing Upper body dressing   What is the patient wearing?: Pull over shirt    Upper body assist Assist Level: Minimal Assistance - Patient > 75%    Lower Body Dressing/Undressing Lower body dressing      What is the patient wearing?: Underwear/pull up, Pants     Lower body assist Assist for lower body dressing: Moderate Assistance - Patient 50 - 74%     Toileting Toileting    Toileting assist Assist for toileting: Minimal Assistance - Patient > 75%     Transfers Chair/bed transfer  Transfers assist     Chair/bed transfer assist level: Contact Guard/Touching assist     Locomotion Ambulation   Ambulation assist      Assist level: Contact Guard/Touching assist Assistive device: Walker-rolling Max distance: 90   Walk 10 feet activity   Assist     Assist level: Contact Guard/Touching assist Assistive device: Walker-rolling   Walk 50 feet activity   Assist Walk 50 feet with 2 turns activity did not occur: Safety/medical concerns  Assist level: Contact Guard/Touching  assist Assistive device: Walker-rolling    Walk 150 feet activity   Assist Walk 150 feet activity did not occur: Safety/medical concerns         Walk 10 feet on uneven surface  activity   Assist     Assist level: Minimal Assistance - Patient > 75% Assistive device: Aeronautical engineer Will patient use wheelchair at  discharge?: No Type of Wheelchair: Manual    Wheelchair assist level: Supervision/Verbal cueing Max wheelchair distance: 50    Wheelchair 50 feet with 2 turns activity    Assist        Assist Level: Supervision/Verbal cueing   Wheelchair 150 feet activity     Assist Wheelchair 150 feet activity did not occur: Safety/medical concerns          Medical Problem List and Plan: 1.  Debility secondary to peripheral vascular disease status post enterectomy left external iliac common femoral superficial artery, left femoral to anterior tibial artery bypass 05/03/2018.  -Continue CIR therapies including PT, OT tolerating therapy 2.  DVT Prophylaxis/Anticoagulation: Subcutaneous Lovenox monitor for any bleeding episodes 3. Pain Management: Oxycodone/Ultram as needed  -pain well controlled 10/15 4. Mood: Provide emotional support 5. Neuropsych: This patient is capable of making decisions on her own behalf. 6. Skin/Wound Care: Routine skin checks, local wound care  -nutrition 7. Fluids/Electrolytes/Nutrition: encourage PO 8.  Leukocytosis/acute on chronic anemia.     Continue iron supplement  -wbc's down to 11.7 10/10, afebrile  9.  CKD.  Creatinine baseline 1.25.    10.  Hypertension.  Lisinopril 40 mg daily, Norvasc 10 mg daily, Lopressor 25 mg twice daily Vitals:   05/10/18 2118 05/11/18 0532  BP: (!) 136/49 (!) 136/52  Pulse: 89 84  Resp: 16 18  Temp: 98.5 F (36.9 C) 98.2 F (36.8 C)  SpO2: 99% 98%  Controlled 05/11/2018 11.  Diastolic congestive heart failure.  Monitor for any signs of fluid overload.  Continue Lasix   Filed Weights   05/05/18 1825 05/10/18 0901 05/11/18 0532  Weight: 60.6 kg 61.9 kg 60.4 kg    -ordered daily weights 12.  Diabetes mellitus with peripheral neuropathy.  Hemoglobin A1c 7.8.  Glucotrol 5 mg twice daily, NovoLog 3 units 3 times daily with meals.    -borderline to fair control at present 10/10   CBG (last 3)  Recent Labs     05/10/18 1639 05/10/18 2229 05/11/18 0653  GLUCAP 139* 80 155*  borderline Controlled 05/11/2018 13.  Hyperlipidemia.  Lipitor 14.  CAD with stenting.  Will discuss with vascular surgery on resuming Plavix as prior to admission.   LOS: 6 days A FACE TO FACE EVALUATION WAS PERFORMED  Meredith Staggers 05/11/2018, 9:09 AM

## 2018-05-11 NOTE — Progress Notes (Signed)
POD 8  Incisions healing + graft pulse Still some edema Has follow up with me at outpt Call if questions  Ruta Hinds, MD [facility] Office: [phone] Pager: [phone]

## 2018-05-12 ENCOUNTER — Telehealth: Payer: Self-pay | Admitting: Vascular Surgery

## 2018-05-12 ENCOUNTER — Inpatient Hospital Stay (HOSPITAL_COMMUNITY): Payer: Medicare Other | Admitting: Physical Therapy

## 2018-05-12 ENCOUNTER — Inpatient Hospital Stay (HOSPITAL_COMMUNITY): Payer: Medicare Other | Admitting: Occupational Therapy

## 2018-05-12 LAB — CREATININE, SERUM
Creatinine, Ser: 1.39 mg/dL — ABNORMAL HIGH (ref 0.44–1.00)
GFR calc Af Amer: 38 mL/min — ABNORMAL LOW (ref >60)
GFR calc non Af Amer: 33 mL/min — ABNORMAL LOW (ref >60)

## 2018-05-12 LAB — GLUCOSE, CAPILLARY
Glucose-Capillary: 163 mg/dL — ABNORMAL HIGH (ref 70–99)
Glucose-Capillary: 157 mg/dL — ABNORMAL HIGH (ref 70–99)
Glucose-Capillary: 184 mg/dL — ABNORMAL HIGH (ref 70–99)
Glucose-Capillary: 204 mg/dL — ABNORMAL HIGH (ref 70–99)
Glucose-Capillary: 92 mg/dL (ref 70–99)
Glucose-Capillary: 193 mg/dL — ABNORMAL HIGH (ref 70–99)

## 2018-05-12 MED ORDER — GLIPIZIDE 5 MG PO TABS
10.0000 mg | ORAL_TABLET | Freq: Two times a day (BID) | ORAL | Status: DC
  Administered 2018-05-12 – 2018-05-18 (×12): 10 mg via ORAL
  Filled 2018-05-12 (×12): qty 2

## 2018-05-12 NOTE — Telephone Encounter (Signed)
sch appt phone NA mld ltr 06/03/18 945am suture removal

## 2018-05-12 NOTE — Progress Notes (Signed)
Occupational Therapy Session Note  Patient Details  Name: Cynthia Ray MRN: [id number] Date of Birth: 10-09-1925  Today's Date: 05/12/2018 OT Individual Time: 0700-0800 OT Individual Time Calculation (min): 60 min  and Today's Date: 05/12/2018 OT Missed Time: 15 Minutes Missed Time Reason: Nursing care   Short Term Goals: Week 1:  OT Short Term Goal 1 (Week 1): Pt will perform LB dressing with min A. OT Short Term Goal 2 (Week 1): Pt will perform toileting with min A OT Short Term Goal 3 (Week 1): Pt will perform toilet transfer with min A and use of RW.  Skilled Therapeutic Interventions/Progress Updates:    Upon entering the room, pt supine in bed with no c/o pain this session and agreeable to OT intervention. Pt performed bed mobility from to EOB with supervision and increased time. Pt standing with min A and ambulating to bathroom for toileting needs. Pt managed clothing and hygiene with steady assistance for balance. Pt doff clothing items while seated on commode chair for safety and transferred onto TTB with steady assistance. OT noted blood draining from dressing on L LE with RN notified. Pt performed bathing while seated on TTB with supervision overall and assistance to wash B LEs. Pt donning pull over shirt with set up A and returning to bed for dressing changes as blood continues to drain down L LE from dressings. RN notified again and pt remained supine. Pt missing 15 minutes for dressing change. Call bell and all needed items within reach.   Therapy Documentation Precautions:  Precautions Precautions: Fall Precaution Comments: non-healing bilat foot wounds Restrictions Weight Bearing Restrictions: No General: General OT Amount of Missed Time: 15 Minutes Vital Signs: Therapy Vitals Temp: 98 F (36.7 C) Temp Source: Oral Pulse Rate: 86 Resp: 18 BP: (!) 140/47 Patient Position (if appropriate): Lying Oxygen Therapy SpO2: 96 % O2 Device: Room  Air   Therapy/Group: Individual Therapy  Gypsy Decant 05/12/2018, 8:09 AM

## 2018-05-12 NOTE — Telephone Encounter (Signed)
-----   Message from Gabriel Earing, [place] sent at 05/11/2018  3:27 PM EDT ----- S/p Endarterectomy of left external iliac common femoral superficial femoral artery with profundoplasty (bovine pericardial patch), left femoral to anterior tibial artery bypass using composite 6 mm propaten PTFE and reversed greater saphenous vein  She needs f/u with Dr. Oneida Alar in 2 weeks (she has sutures that need removal also).   Thanks

## 2018-05-12 NOTE — Progress Notes (Signed)
[facility] [place] PHYSICAL MEDICINE & REHABILITATION PROGRESS NOTE   Subjective/Complaints:  Left leg started bleeding after being up in shower. Pt denies pain  ROS: Patient denies fever, rash, sore throat, blurred vision, nausea, vomiting, diarrhea, cough, shortness of breath or chest pain, joint or back pain, headache, or mood change.   Objective:   No results found. No results for input(s): WBC, HGB, HCT, PLT in the last 72 hours. Recent Labs    05/11/18 2356  CREATININE 1.39*    Intake/Output Summary (Last 24 hours) at 05/12/2018 0833 Last data filed at 05/12/2018 0700 Gross per 24 hour  Intake 660 ml  Output -  Net 660 ml     Physical Exam: Vital Signs Blood pressure (!) 140/47, pulse 86, temperature 98 F (36.7 C), temperature source Oral, resp. rate 18, height 5' (1.524 m), weight 60.3 kg, SpO2 96 %.  Constitutional: No distress . Vital signs reviewed. HEENT: EOMI, oral membranes moist Neck: supple Cardiovascular: RRR without murmur. No JVD    Respiratory: CTA Bilaterally without wheezes or rales. Normal effort    GI: BS +, non-tender, non-distended  Musculoskeletal:  LLE edema and tenderness 1++. Left heel cord a little looser Neurological: alert and appropriate Oriented to person place and time.   Motor: LUE 2+ prox to 4+ distally Right upper extremity: 5 proximal distal Left lower extremity: Flexion, knee extension and ankle dorsiflexion 2- to 2/5 and limited by pain, swelling Right lower extremity: Hip flexion, knee extension 4-/5   dorsiflexion 4/5 --no motor changes Skin:  LLE incisions with s/s drainage, incisions appear approximated without signs of dehiscence.  No abnl erythema or odor Psychiatric: pleasant    Assessment/Plan: 1. Functional deficits secondary to debility, PAD which require 3+ hours per day of interdisciplinary therapy in a comprehensive inpatient rehab setting.  Physiatrist is providing close team supervision and 24 hour management of  active medical problems listed below.  Physiatrist and rehab team continue to assess barriers to discharge/monitor patient progress toward functional and medical goals  Care Tool:  Bathing    Body parts bathed by patient: Right arm, Left arm, Chest, Abdomen, Front perineal area, Right upper leg, Left upper leg, Face, Buttocks   Body parts bathed by helper: Right lower leg, Left lower leg Body parts n/a: Right lower leg, Left lower leg   Bathing assist Assist Level: Minimal Assistance - Patient > 75%     Upper Body Dressing/Undressing Upper body dressing   What is the patient wearing?: Pull over shirt    Upper body assist Assist Level: Set up assist    Lower Body Dressing/Undressing Lower body dressing      What is the patient wearing?: Underwear/pull up, Pants     Lower body assist Assist for lower body dressing: Moderate Assistance - Patient 50 - 74%     Toileting Toileting    Toileting assist Assist for toileting: Minimal Assistance - Patient > 75%     Transfers Chair/bed transfer  Transfers assist     Chair/bed transfer assist level: Contact Guard/Touching assist     Locomotion Ambulation   Ambulation assist      Assist level: Contact Guard/Touching assist Assistive device: Walker-rolling Max distance: 100   Walk 10 feet activity   Assist     Assist level: Supervision/Verbal cueing Assistive device: Walker-rolling   Walk 50 feet activity   Assist Walk 50 feet with 2 turns activity did not occur: Safety/medical concerns  Assist level: Supervision/Verbal cueing Assistive device: Walker-rolling  Walk 150 feet activity   Assist Walk 150 feet activity did not occur: Safety/medical concerns         Walk 10 feet on uneven surface  activity   Assist     Assist level: Minimal Assistance - Patient > 75% Assistive device: Aeronautical engineer Will patient use wheelchair at discharge?: No Type of  Wheelchair: Manual    Wheelchair assist level: Supervision/Verbal cueing Max wheelchair distance: 50    Wheelchair 50 feet with 2 turns activity    Assist        Assist Level: Supervision/Verbal cueing   Wheelchair 150 feet activity     Assist Wheelchair 150 feet activity did not occur: Safety/medical concerns          Medical Problem List and Plan: 1.  Debility secondary to peripheral vascular disease status post enterectomy left external iliac common femoral superficial artery, left femoral to anterior tibial artery bypass 05/03/2018.  --Continue CIR therapies including PT, OT  2.  DVT Prophylaxis/Anticoagulation: Subcutaneous Lovenox monitor for any bleeding episodes 3. Pain Management: Oxycodone/Ultram as needed  -pain well controlled 10/16 4. Mood: Provide emotional support 5. Neuropsych: This patient is capable of making decisions on her own behalf. 6. Skin/Wound Care: Routine skin checks, local wound care  -nutrition  -serosanguinous drainage from wound, wound appears stable   -dry dressing with kerlix to left leg 7. Fluids/Electrolytes/Nutrition: encourage PO 8.  Leukocytosis/acute on chronic anemia.     Continue iron supplement  -wbc's down to 11.7 10/10, remains afebrile  9.  CKD.  Creatinine baseline 1.25.    10.  Hypertension.  Lisinopril 40 mg daily, Norvasc 10 mg daily, Lopressor 25 mg twice daily Vitals:   05/11/18 1936 05/12/18 0427  BP: (!) 141/58 (!) 140/47  Pulse: 95 86  Resp: 18 18  Temp: 98.6 F (37 C) 98 F (36.7 C)  SpO2: 99% 96%  Controlled 05/12/2018 11.  Diastolic congestive heart failure.  Monitor for any signs of fluid overload.  Continue Lasix   Filed Weights   05/10/18 0901 05/11/18 0532 05/12/18 0427  Weight: 61.9 kg 60.4 kg 60.3 kg    -weights stable thus far 12.  Diabetes mellitus with peripheral neuropathy.  Hemoglobin A1c 7.8.  Glucotrol 5 mg twice daily, NovoLog 3 units 3 times daily with meals.       CBG (last 3)   Recent Labs    05/11/18 0653 05/11/18 1134 05/12/18 0624  GLUCAP 155* 173* 184*  borderline Controlled -->increase glucotrol to 10mg  bid 13.  Hyperlipidemia.  Lipitor 14.  CAD with stenting.  Will discuss with vascular surgery on resuming Plavix as prior to admission.   LOS: 7 days A FACE TO FACE EVALUATION WAS PERFORMED  Meredith Staggers 05/12/2018, 8:33 AM

## 2018-05-12 NOTE — Progress Notes (Signed)
Physical Therapy Session Note  Patient Details  Name: Cynthia Ray MRN: [id number] Date of Birth: [birth year]  Today's Date: 05/12/2018 PT Individual Time: 1500-1600 PT Individual Time Calculation (min): 60 min   Short Term Goals: Week 1:  PT Short Term Goal 1 (Week 1): Pt will transfer with min assist consistently PT Short Term Goal 2 (Week 1): Pt will ambulate 52' with LRAD and min assist PT Short Term Goal 3 (Week 1): Pt will negotiate 4 steps with 2 rails and min assist PT Short Term Goal 4 (Week 1): Pt will complete standardized balance assessment.   Skilled Therapeutic Interventions/Progress Updates:    no c/o pain.  Session focus on gait training, activity tolerance, and LE strengthening.  Requires rest breaks throughout session 2/2 fatigue.   PT assisted pt with changing pants for pt comfort, assist for threading pants due to time and supervision mod cues for sit<>stand to pull pants over hips.  Gait training to therapy gym with 1 rest break at nursing station, supervision for ambulation.  Repeated sit<>stand 2x5 reps focus on eccentric control.  Stand<>partial squat with UE support on mat for strengthening and eccentric control.  PT provided pt with paper for RW as external aid for reminder of steps for sit<>stand.  Gait training back to room from gym at end of session, no rest breaks, min cues for walker positioning.  Pt returned to supine with mod assist for LEs into bed.  Call bell in reach and needs met.   Therapy Documentation Precautions:  Precautions Precautions: Fall Precaution Comments: non-healing bilat foot wounds Restrictions Weight Bearing Restrictions: No General: PT Amount of Missed Time (min): 15 Minutes PT Missed Treatment Reason: Nursing care    Therapy/Group: Individual Therapy  Michel Santee 05/12/2018, 4:06 PM

## 2018-05-12 NOTE — Progress Notes (Signed)
Physical Therapy Session Note  Patient Details  Name: Cynthia Ray MRN: [id number] Date of Birth: 08-02-1925  Today's Date: 05/12/2018 PT Individual Time: 2174-7159 PT Individual Time Calculation (min): 45 min   Short Term Goals: Week 1:  PT Short Term Goal 1 (Week 1): Pt will transfer with min assist consistently PT Short Term Goal 2 (Week 1): Pt will ambulate 6' with LRAD and min assist PT Short Term Goal 3 (Week 1): Pt will negotiate 4 steps with 2 rails and min assist PT Short Term Goal 4 (Week 1): Pt will complete standardized balance assessment.   Skilled Therapeutic Interventions/Progress Updates:    Patient received in bed, very pleasant and willing to work with skilled PT services today. Able to complete functional bed mobility with S, functional transfers with min guard and RW, able to briefly maintain balance without UE support to pull up pants. TotalA for donning shoe and post-op boot for time management. Able to ambulate [id number] with S and RW, one seated rest break, then [facility] RW and min guard. When sitting on edge of mat table, PT noticed large blood stain on front of pants leg on surgical side and returned patient to room for nursing care/dressing change. Able to them ambulate in [facility], left up in recliner with RN attending and all needs otherwise met. 15 minutes of therapy time lost due to need for nursing care.    Therapy Documentation Precautions:  Precautions Precautions: Fall Precaution Comments: non-healing bilat foot wounds Restrictions Weight Bearing Restrictions: No General: PT Amount of Missed Time (min): 15 Minutes PT Missed Treatment Reason: Nursing care Vital Signs: Therapy Vitals Pulse Rate: 84 Pain: Pain Assessment Pain Scale: 0-10 Pain Score: 0-No pain    Therapy/Group: Individual Therapy  KDeniece ReePT, DPT, CBIS  [facility]   Pager  [phone] Rehab Office [phone]  05/12/2018, 12:10 PM

## 2018-05-13 ENCOUNTER — Inpatient Hospital Stay (HOSPITAL_COMMUNITY): Payer: Medicare Other | Admitting: Physical Therapy

## 2018-05-13 ENCOUNTER — Inpatient Hospital Stay (HOSPITAL_COMMUNITY): Payer: Medicare Other | Admitting: Occupational Therapy

## 2018-05-13 LAB — GLUCOSE, CAPILLARY
Glucose-Capillary: 165 mg/dL — ABNORMAL HIGH (ref 70–99)
Glucose-Capillary: 155 mg/dL — ABNORMAL HIGH (ref 70–99)
Glucose-Capillary: 208 mg/dL — ABNORMAL HIGH (ref 70–99)
Glucose-Capillary: 178 mg/dL — ABNORMAL HIGH (ref 70–99)

## 2018-05-13 NOTE — Progress Notes (Signed)
Occupational Therapy Session Note  Patient Details  Name: Cynthia Ray MRN: [id number] Date of Birth: [birth year]  Today's Date: 05/13/2018 OT Individual Time: 1000-1100 OT Individual Time Calculation (min): 60 min    Short Term Goals: Week 1:  OT Short Term Goal 1 (Week 1): Pt will perform LB dressing with min A. OT Short Term Goal 2 (Week 1): Pt will perform toileting with min A OT Short Term Goal 3 (Week 1): Pt will perform toilet transfer with min A and use of RW.  Skilled Therapeutic Interventions/Progress Updates:    Pt received in bed agreeable to therapy.  Pt sat to EOB with min A to swing legs off bed and then needed min A to stand to RW.  Used RW to transfer to w/c. Transported to gym where she worked on UE exercises to improve posture with scapular retraction and trunk extension. Pt has very limited sh PROM and kyphotic posture so the type of exercises she can perform are very limited.   Pt worked on sit to stand skills building up from sit to squat using yoga blocks under each hand and then progressed to sit to stand to RW with S.  She then was able to push off the mat into stand with S 10x.   Standing endurance with marching in place and toe taps using RW for support. Pt could only tolerate about 30 sec at a time due to groin discomfort.   Pt requested to walk back to room.  She was able to ambulate about 25 ft before needing a rest and then another 25 ft.  Pt taken back to room and assisted back to bed with min A for her legs.  Pt in bed with alarm on and all needs met.   Therapy Documentation Precautions:  Precautions Precautions: Fall Precaution Comments: non-healing bilat foot wounds Restrictions Weight Bearing Restrictions: No   Pain: pt stated that she has some discomfort in groin area where she had her surgery which prevents her from walking further.    ADL:     Therapy/Group: Individual Therapy  Sanam Marmo 05/13/2018, 11:01 AM

## 2018-05-13 NOTE — Progress Notes (Signed)
[facility] PHYSICAL MEDICINE & REHABILITATION PROGRESS NOTE   Subjective/Complaints:  Left lower leg stopped bleeding. Still with draining groin wound. Otherwise denies complaints  ROS: Patient denies fever, rash, sore throat, blurred vision, nausea, vomiting, diarrhea, cough, shortness of breath or chest pain, joint or back pain, headache, or mood change.   Objective:   No results found. No results for input(s): WBC, HGB, HCT, PLT in the last 72 hours. Recent Labs    05/11/18 2356  CREATININE 1.39*    Intake/Output Summary (Last 24 hours) at 05/13/2018 0856 Last data filed at 05/13/2018 0743 Gross per 24 hour  Intake 690 ml  Output -  Net 690 ml     Physical Exam: Vital Signs Blood pressure (!) 134/42, pulse 83, temperature 98.5 F (36.9 C), temperature source Oral, resp. rate 18, height 5' (1.524 m), weight 60.2 kg, SpO2 95 %.  Constitutional: No distress . Vital signs reviewed. HEENT: EOMI, oral membranes moist Neck: supple Cardiovascular: RRR without murmur. No JVD    Respiratory: CTA Bilaterally without wheezes or rales. Normal effort    GI: BS +, non-tender, non-distended  Musculoskeletal:  LLE edema and tenderness 1++.   Neurological: alert and appropriate Oriented to person place and time.   Motor: LUE 2+ prox to 4+ distally Right upper extremity: 5 proximal distal Left lower extremity: Flexion, knee extension and ankle dorsiflexion 2- to 2/5 and limited by pain, swelling Right lower extremity: Hip flexion, knee extension 4-/5   dorsiflexion 4/5 --no motor changes Skin:  LLE incisions without drainage, left groin with quarter size area of fibronecrotic debris Psychiatric: pleasant    Assessment/Plan: 1. Functional deficits secondary to debility, PAD which require 3+ hours per day of interdisciplinary therapy in a comprehensive inpatient rehab setting.  Physiatrist is providing close team supervision and 24 hour management of active medical problems  listed below.  Physiatrist and rehab team continue to assess barriers to discharge/monitor patient progress toward functional and medical goals  Care Tool:  Bathing    Body parts bathed by patient: Right arm, Left arm, Chest, Abdomen, Front perineal area, Right upper leg, Left upper leg, Face, Buttocks   Body parts bathed by helper: Right lower leg, Left lower leg Body parts n/a: Right lower leg, Left lower leg   Bathing assist Assist Level: Minimal Assistance - Patient > 75%     Upper Body Dressing/Undressing Upper body dressing   What is the patient wearing?: Pull over shirt    Upper body assist Assist Level: Set up assist    Lower Body Dressing/Undressing Lower body dressing      What is the patient wearing?: Underwear/pull up, Pants     Lower body assist Assist for lower body dressing: Moderate Assistance - Patient 50 - 74%     Toileting Toileting    Toileting assist Assist for toileting: Minimal Assistance - Patient > 75%     Transfers Chair/bed transfer  Transfers assist     Chair/bed transfer assist level: Supervision/Verbal cueing     Locomotion Ambulation   Ambulation assist      Assist level: Supervision/Verbal cueing Assistive device: Walker-rolling Max distance: 205   Walk 10 feet activity   Assist     Assist level: Supervision/Verbal cueing Assistive device: Walker-rolling   Walk 50 feet activity   Assist Walk 50 feet with 2 turns activity did not occur: Safety/medical concerns  Assist level: Supervision/Verbal cueing Assistive device: Walker-rolling    Walk 150 feet activity   Assist Walk 150  feet activity did not occur: Safety/medical concerns  Assist level: Supervision/Verbal cueing Assistive device: Walker-rolling    Walk 10 feet on uneven surface  activity   Assist     Assist level: Minimal Assistance - Patient > 75% Assistive device: Aeronautical engineer Will patient use  wheelchair at discharge?: No Type of Wheelchair: Manual    Wheelchair assist level: Supervision/Verbal cueing Max wheelchair distance: 50    Wheelchair 50 feet with 2 turns activity    Assist        Assist Level: Supervision/Verbal cueing   Wheelchair 150 feet activity     Assist Wheelchair 150 feet activity did not occur: Safety/medical concerns          Medical Problem List and Plan: 1.  Debility secondary to peripheral vascular disease status post enterectomy left external iliac common femoral superficial artery, left femoral to anterior tibial artery bypass 05/03/2018.  --Continue CIR therapies including PT, OT  2.  DVT Prophylaxis/Anticoagulation: Subcutaneous Lovenox monitor for any bleeding episodes 3. Pain Management: Oxycodone/Ultram as needed  -pain well controlled 10/17 4. Mood: Provide emotional support 5. Neuropsych: This patient is capable of making decisions on her own behalf. 6. Skin/Wound Care: Routine skin checks, local wound care  -nutrition  -dry dressing to leg  -santyl to groin  7. Fluids/Electrolytes/Nutrition: encourage PO 8.  Leukocytosis/acute on chronic anemia.     Continue iron supplement  -wbc's down to 11.7 10/10, remains afebrile  9.  CKD.  Creatinine baseline 1.25.    10.  Hypertension.  Lisinopril 40 mg daily, Norvasc 10 mg daily, Lopressor 25 mg twice daily Vitals:   05/12/18 2027 05/13/18 0428  BP: 127/61 (!) 134/42  Pulse: (!) 101 83  Resp:  18  Temp:  98.5 F (36.9 C)  SpO2:  95%  Controlled 05/13/2018 11.  Diastolic congestive heart failure.  Monitor for any signs of fluid overload.  Continue Lasix   Filed Weights   05/11/18 0532 05/12/18 0427 05/13/18 0428  Weight: 60.4 kg 60.3 kg 60.2 kg    -weights stable 10/17 12.  Diabetes mellitus with peripheral neuropathy.  Hemoglobin A1c 7.8.  Glucotrol 5 mg twice daily, NovoLog 3 units 3 times daily with meals.       CBG (last 3)  Recent Labs    05/12/18 1623  05/12/18 2105 05/13/18 0613  GLUCAP 92 193* 165*  borderline Controlled -->increased glucotrol to 10mg  bid 13.  Hyperlipidemia.  Lipitor 14.  CAD with stenting.  Will discuss with vascular surgery on resuming Plavix as prior to admission.   LOS: 8 days A FACE TO FACE EVALUATION WAS PERFORMED  Meredith Staggers 05/13/2018, 8:56 AM

## 2018-05-13 NOTE — Progress Notes (Signed)
Physical Therapy Weekly Progress Note  Patient Details  Name: Cynthia Ray MRN: [id number] Date of Birth: 10/15/1925  Beginning of progress report period: May 07, 2018 End of progress report period: May 13, 2018  Today's Date: 05/13/2018 PT Individual Time: 1030-1127 PT Individual Time Calculation (min): 57 min   Patient has met 3 of 4 short term goals.  Pt has made excellent progress with therapy this week and demonstrates an improvement in activity tolerance and independence with all functional mobility.  She continues to require increased assist for sit>supine 2/2 pain and weakness in BLEs, and continues to require cues for safety with sit<>stand transfers due to pushing back through LEs to assist with power up, causing chair/bed to slide.  Patient continues to demonstrate the following deficits muscle weakness and decreased postural control and decreased balance strategies and therefore will continue to benefit from skilled PT intervention to increase functional independence with mobility.  Patient progressing toward long term goals..  Continue plan of care.  PT Short Term Goals Week 1:  PT Short Term Goal 1 (Week 1): Pt will transfer with min assist consistently PT Short Term Goal 1 - Progress (Week 1): Met PT Short Term Goal 2 (Week 1): Pt will ambulate 50' with LRAD and min assist PT Short Term Goal 2 - Progress (Week 1): Met PT Short Term Goal 3 (Week 1): Pt will negotiate 4 steps with 2 rails and min assist PT Short Term Goal 3 - Progress (Week 1): Met PT Short Term Goal 4 (Week 1): Pt will complete standardized balance assessment.  PT Short Term Goal 4 - Progress (Week 1): Not met Week 2:  PT Short Term Goal 1 (Week 2): =LTGs due to ELOS  Skilled Therapeutic Interventions/Progress Updates:    c/o pain "where it always is" but declines intervention.  Session focus on use of external aid for sit<>stand sequencing, ambulation for activity tolerance and balance, and  UE/core strengthening activity.   Pt transitions sit<>stand throughout session with supervision, min cues to utilize external aid to recall sequencing of sit<>stand to reduce retropulsion.  Gait training to increase activity tolerance 50' + 50' +50' +150' with RW and distant supervision.  Pt ambulates weaving through cones focus on walker positioning during turns with good carryover from previous sessions.  UE/core strengthening activity seated edge of mat without back support, using 2# dowel rod to tap ball back to therapy, 3 trials to fatigue with rest breaks between trials, mod cues for forward weight shift and keeping feet on the floor for balance.  Pt returned to room at end of session, completes ambulatory transfer to recliner and positioned with call bell in reach and needs met.   Therapy Documentation Precautions:  Precautions Precautions: Fall Precaution Comments: non-healing bilat foot wounds Restrictions Weight Bearing Restrictions: No   Therapy/Group: Individual Therapy  Caitlin E Warren 05/13/2018, 11:30 AM  

## 2018-05-13 NOTE — Progress Notes (Signed)
Occupational Therapy Weekly Progress Note  Patient Details  Name: Cynthia Ray MRN: [id number] Date of Birth: [birth year]  Beginning of progress report period: May 06, 2018 End of progress report period: May 13, 2018  Today's Date: 05/13/2018 OT Individual Time: 0700-0810 OT Individual Time Calculation (min): 70 min    Patient has met 3 of 3 short term goals. Pt making steady progress towards goals this week. Pt performs sit <>stand with min cuing at supervision level. If lower surface she needs steady assistance for safety. Pt performing toilet transfer with supervision and steady assistance for balance with managing LB clothing and hygiene in standing. Pt utilized reacher to increase independence with LE dressing tasks. Pt continues to need rest breaks with fatigue but endurance has improved since initial evaluation.   Patient continues to demonstrate the following deficits: muscle weakness, decreased cardiorespiratoy endurance and decreased sitting balance, decreased standing balance and decreased balance strategies and therefore will continue to benefit from skilled OT intervention to enhance overall performance with BADL and iADL.  Patient progressing toward long term goals..  Continue plan of care.  OT Short Term Goals Week 1:  OT Short Term Goal 1 (Week 1): Pt will perform LB dressing with min A. OT Short Term Goal 1 - Progress (Week 1): Met OT Short Term Goal 2 (Week 1): Pt will perform toileting with min A OT Short Term Goal 2 - Progress (Week 1): Met OT Short Term Goal 3 (Week 1): Pt will perform toilet transfer with min A and use of RW. OT Short Term Goal 3 - Progress (Week 1): Met Week 2:  OT Short Term Goal 1 (Week 2): STGs=LTGs secondary to upcoming discharge  Skilled Therapeutic Interventions/Progress Updates:    Upon entering the room, pt supine in bed awaiting OT arrival. Pt with no c/o pain this session and agreeable to OT intervention. Pt standing from bed  with supervision and min cuing for proper technique. Pt ambulating with steady assistance into bathroom for toilet transfer. Pt able to void and needing steady assistance for balance while performing hygiene and clothing management. Pt ambulating back to room and seated at sink for bathing tasks. Sit <>stand with supervision and pt bathing buttocks and peri area with close supervision this session. Pt donning pants with increased time, min verbal cuing, and use of reacher to complete tasks. Pt taking seated rest break secondary to fatigue and then performed grooming tasks with set up A. Pt requesting to return to bed secondary to fatigue with min A for sit >supine. Call bell and all needed items within reach upon exiting the room.   Therapy Documentation Precautions:  Precautions Precautions: Fall Precaution Comments: non-healing bilat foot wounds Restrictions Weight Bearing Restrictions: No   Therapy/Group: Individual Therapy  Gypsy Decant 05/13/2018, 12:50 PM

## 2018-05-14 ENCOUNTER — Encounter (HOSPITAL_COMMUNITY): Payer: Medicare Other

## 2018-05-14 ENCOUNTER — Inpatient Hospital Stay (HOSPITAL_COMMUNITY): Payer: Medicare Other | Admitting: Physical Therapy

## 2018-05-14 ENCOUNTER — Inpatient Hospital Stay (HOSPITAL_COMMUNITY): Payer: Medicare Other

## 2018-05-14 LAB — GLUCOSE, CAPILLARY
Glucose-Capillary: 185 mg/dL — ABNORMAL HIGH (ref 70–99)
Glucose-Capillary: 182 mg/dL — ABNORMAL HIGH (ref 70–99)
Glucose-Capillary: 197 mg/dL — ABNORMAL HIGH (ref 70–99)
Glucose-Capillary: 151 mg/dL — ABNORMAL HIGH (ref 70–99)

## 2018-05-14 MED ORDER — COLLAGENASE 250 UNIT/GM EX OINT
TOPICAL_OINTMENT | Freq: Every day | CUTANEOUS | Status: DC | PRN
  Administered 2018-05-16: 10:00:00 via TOPICAL
  Filled 2018-05-14 (×2): qty 30

## 2018-05-14 NOTE — Progress Notes (Signed)
Occupational Therapy Session Note  Patient Details  Name: Cynthia Ray MRN: [id number] Date of Birth: [birth year]  Today's Date: 05/14/2018 OT Individual Time: 1400-1455 OT Individual Time Calculation (min): 55 min    Short Term Goals: Week 1:  OT Short Term Goal 1 (Week 1): Pt will perform LB dressing with min A. OT Short Term Goal 1 - Progress (Week 1): Met OT Short Term Goal 2 (Week 1): Pt will perform toileting with min A OT Short Term Goal 2 - Progress (Week 1): Met OT Short Term Goal 3 (Week 1): Pt will perform toilet transfer with min A and use of RW. OT Short Term Goal 3 - Progress (Week 1): Met  Skilled Therapeutic Interventions/Progress Updates:    1:1. Family ed with daughter and niece present throughout session. No pain reported. Family biggest concerns with shower transfers. OT propelled w/c to ADL apartment total A for time management. OT demo use of posterior method with RW. Pt requires step by step cueing for safety awareness for RW use and supervision for mobility. Family able to return demo supervision. Pt completes toilet transfer with family with supervision and family appropriately cueing. Educated family on making sure pt scoots forward, moves feet underneath and leans forward prior to sit to stand to keep chair from tilting backwards. Family verbalized understanding. Family also educated on use of seated towel glides for shoulder ROM/strengthening as this is a concern of the daughter. Exited session with pt seated in bed, call light in reach and family present supervising  Therapy Documentation Precautions:  Precautions Precautions: Fall Precaution Comments: non-healing bilat foot wounds Restrictions Weight Bearing Restrictions: No General:    Therapy/Group: Individual Therapy  Tonny Branch 05/14/2018, 2:57 PM

## 2018-05-14 NOTE — Progress Notes (Signed)
[facility] PHYSICAL MEDICINE & REHABILITATION PROGRESS NOTE   Subjective/Complaints:  No new complaints. Left inguinal area still sore. Otherwise progressing with therapies  ROS: Patient denies fever, rash, sore throat, blurred vision, nausea, vomiting, diarrhea, cough, shortness of breath or chest pain, joint or back pain, headache, or mood change. .   Objective:   No results found. No results for input(s): WBC, HGB, HCT, PLT in the last 72 hours. Recent Labs    05/11/18 2356  CREATININE 1.39*    Intake/Output Summary (Last 24 hours) at 05/14/2018 1331 Last data filed at 05/14/2018 1227 Gross per 24 hour  Intake 710 ml  Output -  Net 710 ml     Physical Exam: Vital Signs Blood pressure (!) 137/48, pulse 96, temperature 98.4 F (36.9 C), resp. rate 18, height 5' (1.524 m), weight 59.8 kg, SpO2 96 %.  Constitutional: No distress . Vital signs reviewed. HEENT: EOMI, oral membranes moist Neck: supple Cardiovascular: RRR without murmur. No JVD    Respiratory: CTA Bilaterally without wheezes or rales. Normal effort    GI: BS +, non-tender, non-distended  Musculoskeletal:  LLE 1+ edema   Neurological: alert and appropriate Oriented to person place and time.   Motor: LUE 2+ prox to 4+ distally Right upper extremity: 5 proximal distal Left lower extremity: Flexion, knee extension and ankle dorsiflexion 2- to 2/5 and limited by pain, swelling Right lower extremity: Hip flexion, knee extension 4-/5   dorsiflexion 4/5 --no motor changes Skin:  LLE incisions dressed. Groin with fibronecrotic debris Psychiatric: pleasant    Assessment/Plan: 1. Functional deficits secondary to debility, PAD which require 3+ hours per day of interdisciplinary therapy in a comprehensive inpatient rehab setting.  Physiatrist is providing close team supervision and 24 hour management of active medical problems listed below.  Physiatrist and rehab team continue to assess barriers to  discharge/monitor patient progress toward functional and medical goals  Care Tool:  Bathing    Body parts bathed by patient: Right arm, Left arm, Chest, Abdomen, Front perineal area, Right upper leg, Left upper leg, Face, Buttocks   Body parts bathed by helper: Right lower leg, Left lower leg Body parts n/a: Right lower leg, Left lower leg   Bathing assist Assist Level: Minimal Assistance - Patient > 75%     Upper Body Dressing/Undressing Upper body dressing   What is the patient wearing?: Pull over shirt    Upper body assist Assist Level: Set up assist    Lower Body Dressing/Undressing Lower body dressing      What is the patient wearing?: Underwear/pull up, Pants     Lower body assist Assist for lower body dressing: Minimal Assistance - Patient > 75%     Toileting Toileting    Toileting assist Assist for toileting: Minimal Assistance - Patient > 75%     Transfers Chair/bed transfer  Transfers assist     Chair/bed transfer assist level: Supervision/Verbal cueing     Locomotion Ambulation   Ambulation assist      Assist level: Set up assist Assistive device: Walker-rolling Max distance: 150   Walk 10 feet activity   Assist     Assist level: Set up assist Assistive device: Walker-rolling   Walk 50 feet activity   Assist Walk 50 feet with 2 turns activity did not occur: Safety/medical concerns  Assist level: Set up assist Assistive device: Walker-rolling    Walk 150 feet activity   Assist Walk 150 feet activity did not occur: Safety/medical concerns  Assist  level: Set up assist Assistive device: Walker-rolling    Walk 10 feet on uneven surface  activity   Assist     Assist level: Minimal Assistance - Patient > 75% Assistive device: Aeronautical engineer Will patient use wheelchair at discharge?: No Type of Wheelchair: Manual    Wheelchair assist level: Supervision/Verbal cueing Max wheelchair  distance: 50    Wheelchair 50 feet with 2 turns activity    Assist        Assist Level: Supervision/Verbal cueing   Wheelchair 150 feet activity     Assist Wheelchair 150 feet activity did not occur: Safety/medical concerns          Medical Problem List and Plan: 1.  Debility secondary to peripheral vascular disease status post enterectomy left external iliac common femoral superficial artery, left femoral to anterior tibial artery bypass 05/03/2018.  --Continue CIR therapies including PT, OT  2.  DVT Prophylaxis/Anticoagulation: Subcutaneous Lovenox monitor for any bleeding episodes 3. Pain Management: Oxycodone/Ultram as needed  -pain adequately controlled 4. Mood: Provide emotional support 5. Neuropsych: This patient is capable of making decisions on her own behalf. 6. Skin/Wound Care: Routine skin checks, local wound care  -nutrition  -dry dressing to lower leg    -santyl to groin and foot 7. Fluids/Electrolytes/Nutrition: encourage PO 8.  Leukocytosis/acute on chronic anemia.     Continue iron supplement  -wbc's down to 11.7 10/10, remains afebrile  9.  CKD.  Creatinine baseline 1.25.    10.  Hypertension.  Lisinopril 40 mg daily, Norvasc 10 mg daily, Lopressor 25 mg twice daily Vitals:   05/14/18 0645 05/14/18 0738  BP: (!) 137/48   Pulse: 89 96  Resp: 18   Temp:    SpO2: 96%   Controlled 05/14/2018 11.  Diastolic congestive heart failure.  Monitor for any signs of fluid overload.  Continue Lasix   Filed Weights   05/13/18 0428 05/14/18 0500 05/14/18 0645  Weight: 60.2 kg 60.2 kg 59.8 kg    -weights stable 10/18 12.  Diabetes mellitus with peripheral neuropathy.  Hemoglobin A1c 7.8.  Glucotrol 5 mg twice daily, NovoLog 3 units 3 times daily with meals.       CBG (last 3)  Recent Labs    05/13/18 2139 05/14/18 0645 05/14/18 1109  GLUCAP 178* 185* 182*   increased glucotrol to 10mg  bid on 10/16---showing some improvement 13.  Hyperlipidemia.   Lipitor 14.  CAD with stenting.  Will discuss with vascular surgery on resuming Plavix as prior to admission.   LOS: 9 days A FACE TO FACE EVALUATION WAS PERFORMED  Meredith Staggers 05/14/2018, 1:31 PM

## 2018-05-14 NOTE — Plan of Care (Signed)
  Problem: RH SKIN INTEGRITY Goal: RH STG SKIN FREE OF INFECTION/BREAKDOWN Description With min assist  Outcome: Progressing Goal: RH STG ABLE TO PERFORM INCISION/WOUND CARE W/ASSISTANCE Description STG Able To Perform Incision/Wound Care With Assistance Min.  Outcome: Progressing   Problem: RH SAFETY Goal: RH STG ADHERE TO SAFETY PRECAUTIONS W/ASSISTANCE/DEVICE Description STG Adhere to Safety Precautions With Assistance/Device. Mod I  Outcome: Progressing   Problem: RH PAIN MANAGEMENT Goal: RH STG PAIN MANAGED AT OR BELOW PT'S PAIN GOAL Description Less than 3   Outcome: Progressing

## 2018-05-14 NOTE — Progress Notes (Signed)
Social Work Patient ID: Cynthia Ray, female   DOB: 09-18-1925, 84 y.o.   MRN: [id number]   CSW met with pt, pt's dtr, and pt's niece 05-12-18 to update them on team conference discussion and pt's targeted d/c date of 05-18-18.  Also told them that team is recommending pt have 24/7 supervision.  Dtr asked for how long and CSW explained that home therapists would help with that determination.  Dtr to come for family education 05-14-18.  Pt has most DME that will be recommended and had [facility] in the past, so CSW will contact them for f/u therapies.  Pt has life alert system at home.  CSW will continue to follow and assist as needed.

## 2018-05-14 NOTE — Progress Notes (Signed)
Occupational Therapy Session Note  Patient Details  Name: Cynthia Ray MRN: [id number] Date of Birth: 01/22/1926  Today's Date: 05/14/2018 OT Individual Time: 0800-0910 OT Individual Time Calculation (min): 70 min    Short Term Goals: Week 1:  OT Short Term Goal 1 (Week 1): Pt will perform LB dressing with min A. OT Short Term Goal 1 - Progress (Week 1): Met OT Short Term Goal 2 (Week 1): Pt will perform toileting with min A OT Short Term Goal 2 - Progress (Week 1): Met OT Short Term Goal 3 (Week 1): Pt will perform toilet transfer with min A and use of RW. OT Short Term Goal 3 - Progress (Week 1): Met  Skilled Therapeutic Interventions/Progress Updates:    1:1. Pt with pain in RLE from incision but does not want medication. Therapy to tolerance and rest. Pt compeltes stand pivot transfer with VC for scooting forward and bringing feet underneath hips prior to sit to stand. Pt bathes at sit to stand level at sink with supervision and VC for hand placement. Pt declines washing feet. Pt usees reacher to thread BLE into pants and underwear with supervision. Pt sit to stand as stated above to advance pants past hips with supervision. Pt dons R shoe with set up and L shoe with A from OT. Pt seatedd grooming with supervision for locating items on sink. Pt completes standing folding towels with improved reciprocal scoot to edge of chair prior to sit to stand with supervision. Exited session with pt seated in bed (exit alarm on) call light in reach and all needs met.  Therapy Documentation Precautions:  Precautions Precautions: Fall Precaution Comments: non-healing bilat foot wounds Restrictions Weight Bearing Restrictions: No General:   Vital Signs: Therapy Vitals Temp: 98.4 F (36.9 C) Pulse Rate: 96 Resp: 18 BP: (!) 137/48 Patient Position (if appropriate): Sitting Oxygen Therapy SpO2: 96 % O2 Device: Room Air   Therapy/Group: Individual Therapy  Tonny Branch 05/14/2018, 8:16 AM

## 2018-05-14 NOTE — Progress Notes (Signed)
Physical Therapy Session Note  Patient Details  Name: Cynthia Ray MRN: [id number] Date of Birth: Nov 30, 1925  Today's Date: 05/14/2018 PT Individual Time: 1300-1400 PT Individual Time Calculation (min): 60 min   Short Term Goals: Week 2:  PT Short Term Goal 1 (Week 2): =LTGs due to ELOS  Skilled Therapeutic Interventions/Progress Updates:    no c/o pain.  Session focus on family education re: concerns for not having supervision at home on discharge, and demonstration of basic transfers, ambulation on firm and compliant surfaces as well as obstacle negotiation, and car transfers.  Pt's family adamant that they cannot provide 24/7 supervision at home, but will be able to check on patient throughout the day.  Pt currently performing short distance ambulation in controlled environment of hospital room/hallway/gym with distant supervision and would likely be safe to ambulate alone in her home environment.  Pt does continue to require occasional cuing to use external aid or to recall steps for forward weight shift and positioning of LEs during sit<>stand transfer to reduce retropulsion.  Discussed with family home set up, including bed and recliner (which pt sleeps in).  Per family, recliner is on carpet to reduce sliding but pt does occasionally forget to lower the footrest of recliner fully when attempting to toilet at night.  Suggested external aids for reminders and pt may potentially benefit with someone staying overnight initially to help her remember to use external aids.  Pt overall doing much better with recall with practice and would likely not need 24/7 supervision for mobility except initially at discharge. Pt returned to room at end of session, supervision with verbal cues for sit>supine and pt positioned to comfort with call bell in reach and needs met.   Therapy Documentation Precautions:  Precautions Precautions: Fall Precaution Comments: non-healing bilat foot  wounds Restrictions Weight Bearing Restrictions: No    Therapy/Group: Individual Therapy  Michel Santee 05/14/2018, 2:04 PM

## 2018-05-15 ENCOUNTER — Inpatient Hospital Stay (HOSPITAL_COMMUNITY): Payer: Medicare Other | Admitting: Physical Therapy

## 2018-05-15 DIAGNOSIS — E785 Hyperlipidemia, unspecified: Secondary | ICD-10-CM

## 2018-05-15 LAB — GLUCOSE, CAPILLARY
Glucose-Capillary: 149 mg/dL — ABNORMAL HIGH (ref 70–99)
Glucose-Capillary: 239 mg/dL — ABNORMAL HIGH (ref 70–99)
Glucose-Capillary: 224 mg/dL — ABNORMAL HIGH (ref 70–99)
Glucose-Capillary: 165 mg/dL — ABNORMAL HIGH (ref 70–99)

## 2018-05-15 NOTE — Progress Notes (Signed)
Cynthia Ray is a 84 y.o. female 04-18-1926 [id number]  Subjective: Patient is feeling well.  Request additional chewing gum pack from drawer.  Slept well.  Denies pain  Objective: Vital signs in last 24 hours: Temp:  [95.5 F (35.3 C)-99.4 F (37.4 C)] 98.3 F (36.8 C) (10/19 0600) Pulse Rate:  [91-94] 91 (10/19 0542) Resp:  [16-19] 16 (10/19 0542) BP: (120-137)/(47-61) 137/47 (10/19 0542) SpO2:  [86 %-100 %] 97 % (10/19 0600) Weight:  [60.5 kg] 60.5 kg (10/19 0545) Weight change: 0.3 kg Last BM Date: 05/14/18  Intake/Output from previous day: 10/18 0701 - 10/19 0700 In: 690 [P.O.:690] Out: -   Physical Exam General: No apparent distress; sitting upright in bed Lungs: Normal effort. Lungs clear to auscultation, no crackles or wheezes. Cardiovascular: Regular rate and rhythm,    Lab Results: BMET    Component Value Date/Time   NA 133 (L) 05/06/2018 0456   K 4.0 05/06/2018 0456   CL 101 05/06/2018 0456   CO2 24 05/06/2018 0456   GLUCOSE 208 (H) 05/06/2018 0456   BUN 21 05/06/2018 0456   CREATININE 1.39 (H) 05/11/2018 2356   CALCIUM 7.7 (L) 05/06/2018 0456   GFRNONAA 33 (L) 05/11/2018 2356   GFRAA 38 (L) 05/11/2018 2356   CBC    Component Value Date/Time   WBC 11.7 (H) 05/06/2018 0456   RBC 3.74 (L) 05/06/2018 0456   HGB 11.0 (L) 05/06/2018 0456   HCT 33.5 (L) 05/06/2018 0456   PLT 111 (L) 05/06/2018 0456   MCV 89.6 05/06/2018 0456   MCH 29.4 05/06/2018 0456   MCHC 32.8 05/06/2018 0456   RDW 15.7 (H) 05/06/2018 0456   LYMPHSABS 1.7 05/06/2018 0456   MONOABS 1.3 (H) 05/06/2018 0456   EOSABS 0.1 05/06/2018 0456   BASOSABS 0.0 05/06/2018 0456   CBG's (last 3):   Recent Labs    05/14/18 1633 05/14/18 2103 05/15/18 0719  GLUCAP 197* 151* 149*   LFT's Lab Results  Component Value Date   ALT 7 05/06/2018   AST 15 05/06/2018   ALKPHOS 56 05/06/2018   BILITOT 0.7 05/06/2018    Studies/Results: No results found.  Medications:  I have  reviewed the patient's current medications. Scheduled Medications: . amLODipine  10 mg Oral Daily  . aspirin  81 mg Oral Daily  . atorvastatin  80 mg Oral Daily  . calcium carbonate  1 tablet Oral Q breakfast  . cholecalciferol  1,000 Units Oral Daily  . clopidogrel  75 mg Oral Daily  . docusate sodium  100 mg Oral Daily  . enoxaparin (LOVENOX) injection  30 mg Subcutaneous Q24H  . furosemide  20 mg Oral Daily  . glipiZIDE  10 mg Oral BID AC  . insulin aspart  0-15 Units Subcutaneous TID WC  . insulin aspart  3 Units Subcutaneous TID WC  . iron polysaccharides  150 mg Oral Daily  . lisinopril  40 mg Oral Daily  . magnesium oxide  400 mg Oral Daily  . metoprolol tartrate  25 mg Oral BID  . pantoprazole  40 mg Oral Q2200  . vitamin C  500 mg Oral Daily   PRN Medications: acetaminophen **OR** acetaminophen, bisacodyl, bisacodyl, collagenase, guaiFENesin-dextromethorphan, nitroGLYCERIN, oxyCODONE, polyvinyl alcohol, sorbitol, traMADol  Assessment/Plan: Principal Problem:   Debility Active Problems:   HTN (hypertension)   Dyslipidemia   CAD (coronary artery disease)   PVD (peripheral vascular disease) (HCC)   Diabetes mellitus type 2 in nonobese ([place])  1.  Debility following  left external iliac common femoral to anterior tibial artery bypass October 7.  Continue inpatient rehab with PT and OT as ongoing. 2.  PAD.  Continue medical management. 3.  Hypertension.  Reasonable control.  Continue current medications. 4.  Type 2 diabetes with peripheral neuropathy.  CBGs well-controlled.  Continue current therapies with sliding scale and meal coverage as ongoing. 5.  Dyslipidemia.  Continue statin  Length of stay, days: 10   Elmire Amrein A. Asa Lente, MD 05/15/2018, 10:05 AM

## 2018-05-15 NOTE — Progress Notes (Signed)
Physical Therapy Session Note  Patient Details  Name: Cynthia Ray MRN: [id number] Date of Birth: 02/15/1926  Today's Date: 05/15/2018 PT Individual Time: 1120-1150 PT Individual Time Calculation (min): 30 min   Short Term Goals: Week 2:  PT Short Term Goal 1 (Week 2): =LTGs due to ELOS  Skilled Therapeutic Interventions/Progress Updates:    no c/o pain.  Session focus on activity tolerance and recall for sequencing and safety of basic transfers.    Pt transitions to EOB with increased time, supervision and HOB elevated.  Stand/pivot transfers with RW throughout session x4, pt able to recall steps for safe transfer 50% of the time.  Nustep x10 minutes at level 1 for UE AAROM and overall cardiopulmonary endurance with min cues for pacing and pain-free ROM.  Pt returned to room at end of session, declining remaining OOB for lunch.  Positioned in supine with min assist for LLE.  Call bell in reach and needs met.   Therapy Documentation Precautions:  Precautions Precautions: Fall Precaution Comments: non-healing bilat foot wounds Restrictions Weight Bearing Restrictions: No    Therapy/Group: Individual Therapy  Caitlin E Warren 05/15/2018, 12:00 PM  

## 2018-05-16 ENCOUNTER — Inpatient Hospital Stay (HOSPITAL_COMMUNITY): Payer: Medicare Other

## 2018-05-16 DIAGNOSIS — I2510 Atherosclerotic heart disease of native coronary artery without angina pectoris: Secondary | ICD-10-CM

## 2018-05-16 LAB — GLUCOSE, CAPILLARY
Glucose-Capillary: 180 mg/dL — ABNORMAL HIGH (ref 70–99)
Glucose-Capillary: 224 mg/dL — ABNORMAL HIGH (ref 70–99)
Glucose-Capillary: 188 mg/dL — ABNORMAL HIGH (ref 70–99)
Glucose-Capillary: 180 mg/dL — ABNORMAL HIGH (ref 70–99)

## 2018-05-16 MED ORDER — COLLAGENASE 250 UNIT/GM EX OINT
TOPICAL_OINTMENT | Freq: Every day | CUTANEOUS | Status: DC
  Filled 2018-05-16: qty 30

## 2018-05-16 MED ORDER — COLLAGENASE 250 UNIT/GM EX OINT: TOPICAL_OINTMENT | Freq: Every day | CUTANEOUS | Status: DC

## 2018-05-16 MED ORDER — COLLAGENASE 250 UNIT/GM EX OINT
TOPICAL_OINTMENT | Freq: Two times a day (BID) | CUTANEOUS | Status: DC
  Administered 2018-05-16 – 2018-05-18 (×5): via TOPICAL
  Filled 2018-05-16: qty 30

## 2018-05-16 NOTE — Plan of Care (Signed)
  Problem: RH SKIN INTEGRITY Goal: RH STG SKIN FREE OF INFECTION/BREAKDOWN Description With min assist  Outcome: Progressing Goal: RH STG ABLE TO PERFORM INCISION/WOUND CARE W/ASSISTANCE Description STG Able To Perform Incision/Wound Care With Assistance Min.  Outcome: Progressing   Problem: RH SAFETY Goal: RH STG ADHERE TO SAFETY PRECAUTIONS W/ASSISTANCE/DEVICE Description STG Adhere to Safety Precautions With Assistance/Device. Mod I  Outcome: Progressing   Problem: RH PAIN MANAGEMENT Goal: RH STG PAIN MANAGED AT OR BELOW PT'S PAIN GOAL Description Less than 3   Outcome: Progressing

## 2018-05-16 NOTE — Progress Notes (Signed)
Cynthia Ray is a 84 y.o. female 18-Dec-1925 [id number]  Subjective: Patient is feeling well.  RN notes increased odor and yellow drainage from groin wound.  Objective: Vital signs in last 24 hours: Temp:  [98.2 F (36.8 C)-98.5 F (36.9 C)] 98.2 F (36.8 C) (10/20 0439) Pulse Rate:  [81-99] 96 (10/20 0917) Resp:  [16-18] 18 (10/20 0439) BP: (128-152)/(54-69) 128/57 (10/20 0917) SpO2:  [96 %-98 %] 96 % (10/20 0439) Weight:  [60.6 kg] 60.6 kg (10/20 0439) Weight change: 0.1 kg Last BM Date: 05/15/18  Intake/Output from previous day: 10/19 0701 - 10/20 0700 In: 840 [P.O.:840] Out: -   Physical Exam General: No apparent distress; sitting upright in bed Lungs: Normal effort. Lungs clear to auscultation, no crackles or wheezes. Cardiovascular: Regular rate and rhythm,    Lab Results: BMET    Component Value Date/Time   NA 133 (L) 05/06/2018 0456   K 4.0 05/06/2018 0456   CL 101 05/06/2018 0456   CO2 24 05/06/2018 0456   GLUCOSE 208 (H) 05/06/2018 0456   BUN 21 05/06/2018 0456   CREATININE 1.39 (H) 05/11/2018 2356   CALCIUM 7.7 (L) 05/06/2018 0456   GFRNONAA 33 (L) 05/11/2018 2356   GFRAA 38 (L) 05/11/2018 2356   CBC    Component Value Date/Time   WBC 11.7 (H) 05/06/2018 0456   RBC 3.74 (L) 05/06/2018 0456   HGB 11.0 (L) 05/06/2018 0456   HCT 33.5 (L) 05/06/2018 0456   PLT 111 (L) 05/06/2018 0456   MCV 89.6 05/06/2018 0456   MCH 29.4 05/06/2018 0456   MCHC 32.8 05/06/2018 0456   RDW 15.7 (H) 05/06/2018 0456   LYMPHSABS 1.7 05/06/2018 0456   MONOABS 1.3 (H) 05/06/2018 0456   EOSABS 0.1 05/06/2018 0456   BASOSABS 0.0 05/06/2018 0456   CBG's (last 3):   Recent Labs    05/15/18 1651 05/15/18 2132 05/16/18 0633  GLUCAP 224* 165* 180*   LFT's Lab Results  Component Value Date   ALT 7 05/06/2018   AST 15 05/06/2018   ALKPHOS 56 05/06/2018   BILITOT 0.7 05/06/2018    Studies/Results: No results found.  Medications:  I have reviewed the  patient's current medications. Scheduled Medications: . amLODipine  10 mg Oral Daily  . aspirin  81 mg Oral Daily  . atorvastatin  80 mg Oral Daily  . calcium carbonate  1 tablet Oral Q breakfast  . cholecalciferol  1,000 Units Oral Daily  . clopidogrel  75 mg Oral Daily  . [START ON 05/17/2018] collagenase   Topical Daily  . docusate sodium  100 mg Oral Daily  . enoxaparin (LOVENOX) injection  30 mg Subcutaneous Q24H  . furosemide  20 mg Oral Daily  . glipiZIDE  10 mg Oral BID AC  . insulin aspart  0-15 Units Subcutaneous TID WC  . insulin aspart  3 Units Subcutaneous TID WC  . iron polysaccharides  150 mg Oral Daily  . lisinopril  40 mg Oral Daily  . magnesium oxide  400 mg Oral Daily  . metoprolol tartrate  25 mg Oral BID  . pantoprazole  40 mg Oral Q2200  . vitamin C  500 mg Oral Daily   PRN Medications: acetaminophen **OR** acetaminophen, bisacodyl, bisacodyl, guaiFENesin-dextromethorphan, nitroGLYCERIN, oxyCODONE, polyvinyl alcohol, sorbitol, traMADol  Assessment/Plan: Principal Problem:   Debility Active Problems:   HTN (hypertension)   Dyslipidemia   CAD (coronary artery disease)   PVD (peripheral vascular disease) (HCC)   Diabetes mellitus type 2 in nonobese (  [place])  1.  Debility following left external iliac common femoral to anterior tibial artery bypass October 7.  Continue inpatient rehab with PT and OT as ongoing. 2.  PAD.  Continue medical management. 3.  Hypertension.  Reasonable control.  Continue current medications. 4.  Type 2 diabetes with peripheral neuropathy.  CBGs well-controlled.  Continue current therapies with sliding scale and meal coverage as ongoing. 5.  Dyslipidemia.  Continue statin 6. Groin wound - change care to scheduled dressing rather than prn and consider WOC RN consult and eval if not improving in next 24-48h. No fever or redness but purulence may need further intervention if not improved with local care on schedule  Length of stay, days:  11   Valerie A. Asa Lente, MD 05/16/2018, 11:38 AM

## 2018-05-16 NOTE — Progress Notes (Signed)
Occupational Therapy Session Note  Patient Details  Name: Cynthia Ray MRN: [id number] Date of Birth: 12/19/1925  Today's Date: 05/16/2018 OT Individual Time: 1000-1045 OT Individual Time Calculation (min): 45 min    Short Term Goals: Week 2:  OT Short Term Goal 1 (Week 2): STGs=LTGs secondary to upcoming discharge  Skilled Therapeutic Interventions/Progress Updates:    OT intervention with focus on bed mobility, sit<>stand, standing balance, functional amb with RW, activity tolerance, and safety awareness to increase independence with BADLs. Pt resting in bed upon arrival and sat EOB with min A to don pants before amb with RW to w/c at sink.  Pt performs sit<>stand with supervision. Pt required min A for donning pants with reacher.  Pt completed grooming tasks seated at sink.  Pt transitioned to day room and engaged in standing activities with focus on activity tolerance and standing balance. Pt returned to room and transferred back to bed.  Pt remained in bed with all needs within reach and bed alarm activated.   Therapy Documentation Precautions:  Precautions Precautions: Fall Precaution Comments: non-healing bilat foot wounds Restrictions Weight Bearing Restrictions: No General:   Vital Signs: Therapy Vitals Pulse Rate: 96 BP: (!) 128/57 Pain: Pain Assessment Pain Scale: 0-10 Pain Score: 0-No pain ADL:   Vision   Perception    Praxis   Exercises:   Other Treatments:     Therapy/Group: Individual Therapy  Leroy Libman 05/16/2018, 10:50 AM

## 2018-05-17 ENCOUNTER — Inpatient Hospital Stay (HOSPITAL_COMMUNITY): Payer: Medicare Other | Admitting: Physical Therapy

## 2018-05-17 ENCOUNTER — Inpatient Hospital Stay (HOSPITAL_COMMUNITY): Payer: Medicare Other | Admitting: Occupational Therapy

## 2018-05-17 LAB — GLUCOSE, CAPILLARY
Glucose-Capillary: 188 mg/dL — ABNORMAL HIGH (ref 70–99)
Glucose-Capillary: 164 mg/dL — ABNORMAL HIGH (ref 70–99)
Glucose-Capillary: 146 mg/dL — ABNORMAL HIGH (ref 70–99)
Glucose-Capillary: 204 mg/dL — ABNORMAL HIGH (ref 70–99)

## 2018-05-17 NOTE — Plan of Care (Signed)
  Problem: RH SKIN INTEGRITY Goal: RH STG SKIN FREE OF INFECTION/BREAKDOWN Description With min assist  Outcome: Progressing Goal: RH STG ABLE TO PERFORM INCISION/WOUND CARE W/ASSISTANCE Description STG Able To Perform Incision/Wound Care With Assistance Min.  Outcome: Progressing   Problem: RH SAFETY Goal: RH STG ADHERE TO SAFETY PRECAUTIONS W/ASSISTANCE/DEVICE Description STG Adhere to Safety Precautions With Assistance/Device. Mod I  Outcome: Progressing   Problem: RH PAIN MANAGEMENT Goal: RH STG PAIN MANAGED AT OR BELOW PT'S PAIN GOAL Description Less than 3   Outcome: Progressing

## 2018-05-17 NOTE — Progress Notes (Signed)
Vascular surgery contacted this morning in regards to follow-up surgical site after left femoral anterior tibial artery bypass 05/03/2018.  Spoke with PA who did check on patient this morning plans to return back again in the morning to check wound with appropriate recommendations

## 2018-05-17 NOTE — Progress Notes (Signed)
Report to Dr Naaman Plummer this am that wound to left groin is now with foul odor with drainage.  States he will have vascular assess. Wound to mid calf area with bloody drainage when pt got up for therapy, wound had not been draining previously for a few days so no dressing was applied as per order.  New gauze applied for new dressing after cleansing with soap and water, then new sanguinous drainage again soaked through gauze at noon, cleansed with saline, steri-strips to oozing areas, telfa, then gauze, then ABD , then kerlix applied, pt has 2 more therapy sessiions this afternoon. Awaiting vascular to assess groin so that dressing has not been changed yet.

## 2018-05-17 NOTE — Progress Notes (Signed)
Physical Therapy Discharge Summary  Patient Details  Name: Cynthia Ray MRN: [id number] Date of Birth: 03/14/1926  Today's Date: 05/17/2018 PT Individual Time: 1100-1155 and 1300-1400 and 1545-1615 PT Individual Time Calculation (min): 55 min and 60 min and 30 min (145 min total)   Patient has met 7 of 8 long term goals due to improved activity tolerance, improved balance, improved postural control and increased strength.  Patient to discharge at an ambulatory level Supervision.   Patient's care partner is independent to provide the necessary supervision assistance at discharge.  Reasons goals not met: Pt will not self propel w/c at d/c.    Recommendation:  Patient will benefit from ongoing skilled PT services in home health setting to continue to advance safe functional mobility, address ongoing impairments in endurance, strength, and balance, and minimize fall risk.  Equipment: No equipment provided  Reasons for discharge: treatment goals met  Patient/family agrees with progress made and goals achieved: Yes   Skilled PT Intervention: Session 1: Pt with c/o 6/10 pain, but reports being premedicated.  Session focus on activity tolerance with functional mobility and d/c assessment.  Pt performing mobility as below, overall supervision level.  Needs cues for sequencing of sit>stand safely in <25% of opportunities.  Discussed DME to use at d/c (RW at all times, and transport w/c for community distances, and pt has both).  During car transfers, pt noted to have blood seeping through medial side of L pant leg, wet.  Returned to room/bed and RN aware.  Call bell in reach and needs met.   Session 2: no c/o pain.  Session focus on HEP education and ambulation.  Pt ambulates room>end of nursing station and nursing station to gym with set up assist and RW.  PT instructed pt in HEP for BLE, 2x10 reps as below. Returned to room in w/c at end of session and positioned in bed with min assist for  LLE, with call bell in reach and needs met.   Exercises  Seated Long Arc Quad - 10 reps - 2 sets - 1x daily - 3x weekly  Seated Heel Raise - 10 reps - 2 sets - 1x daily - 3x weekly  Seated March - 10 reps - 2 sets - 1x daily - 3x weekly  Seated Toe Raise - 10 reps - 2 sets - 1x daily - 3x weekly  Seated Hip Adduction Isometrics with Pillow - 10 reps - 2 sets - 1x daily - 3x weekly  Seated Hip Abduction with Resistance - 10 reps - 2 sets - 1x daily - 3x weekly    Session 3: no c/o pain at rest but does report some increased soreness with ambulation.  PT reviewed handout for HEP (from session 2) with pt and placed with schedules for family to take home.  Gait training to nurses station and back with 1 seated rest break due to fatigue.  Pt returned to bed at end of session, mod assist for LEs into bed 2/2 fatigue.  Pt positioned in R side lying with pillow between knees, call bell in reach and needs met.   PT Discharge Precautions/Restrictions Precautions Precautions: Fall Precaution Comments: non-healing bilat foot wounds Other Brace/Splint: n/a Restrictions Weight Bearing Restrictions: No Pain Pain Assessment Pain Scale: 0-10 Pain Score: 6  Pain Type: Acute pain Pain Location: Leg Pain Orientation: Left Pain Descriptors / Indicators: Aching Pain Frequency: Occasional Pain Onset: Gradual Patients Stated Pain Goal: 2 Pain Intervention(s): RN made aware;Other (Comment)(RN premedicated) Vision/Perception  Perception Perception: Within Functional Limits Praxis Praxis: Intact  Cognition Overall Cognitive Status: Within Functional Limits for tasks assessed Arousal/Alertness: Awake/alert Orientation Level: Oriented X4(cues for date) Selective Attention: Appears intact Memory: Appears intact Safety/Judgment: Appears intact Sensation Sensation Light Touch: Impaired Detail Peripheral sensation comments: sensation to LT appears absent in BLEs below the ankles Light Touch Impaired  Details: Impaired LLE;Impaired RLE Hot/Cold: Appears Intact Coordination Gross Motor Movements are Fluid and Coordinated: Yes Fine Motor Movements are Fluid and Coordinated: Yes Coordination and Movement Description: decreased coordination 2/2 pain and weakness Motor  Motor Motor - Discharge Observations: weakness but has improved since initial evaluation  Mobility Bed Mobility Bed Mobility: Supine to Sit Rolling Right: Supervision/verbal cueing Rolling Left: Supervision/Verbal cueing Supine to Sit: Independent with assistive device Transfers Transfers: Sit to Stand;Stand to Sit Sit to Stand: Supervision/Verbal cueing Stand to Sit: Supervision/Verbal cueing Stand Pivot Transfers: Supervision/Verbal cueing Stand Pivot Transfer Details: Verbal cues for precautions/safety;Verbal cues for technique;Verbal cues for sequencing Stand Pivot Transfer Details (indicate cue type and reason): occasional cues for use of external aid to recall steps for safe sit>stand Transfer (Assistive device): Rolling walker Locomotion  Gait Ambulation: Yes Gait Assistance: Set up assist Gait Distance (Feet): 100 Feet Assistive device: Rolling walker Gait Gait: Yes Gait Pattern: Impaired Gait Pattern: Trunk flexed;Decreased stride length;Antalgic Gait velocity: improving gait speed Stairs / Additional Locomotion Stairs: Yes Stairs Assistance: Supervision/Verbal cueing Number of Stairs: 8 Wheelchair Mobility Wheelchair Mobility: No  Trunk/Postural Assessment  Cervical Assessment Cervical Assessment: Exceptions to WFL(forward head) Thoracic Assessment Thoracic Assessment: Exceptions to WFL(kyphotic, rounded shoulders) Lumbar Assessment Lumbar Assessment: Exceptions to WFL(posterior pelvic tilt) Postural Control Postural Control: Deficits on evaluation Postural Limitations: decreased  Balance Balance Balance Assessed: Yes Dynamic Sitting Balance Dynamic Sitting - Balance Support: Feet  supported;During functional activity Dynamic Sitting - Level of Assistance: 5: Stand by assistance Static Standing Balance Static Standing - Balance Support: Right upper extremity supported;Left upper extremity supported;During functional activity Static Standing - Level of Assistance: 5: Stand by assistance Dynamic Standing Balance Dynamic Standing - Balance Support: During functional activity;Left upper extremity supported;Right upper extremity supported Dynamic Standing - Level of Assistance: 5: Stand by assistance Extremity Assessment  RUE Assessment RUE Assessment: Exceptions to [facility] Active Range of Motion (AROM) Comments: limited to shoulder elevation at 80 degrees General Strength Comments: within functional limits LUE Assessment LUE Assessment: Exceptions to [facility] Passive Range of Motion (PROM) Comments: 90 degrees shoulder elevation Active Range of Motion (AROM) Comments: 45 degrees shoulder elevation General Strength Comments: within functional limits        Michel Santee 05/17/2018, 11:59 AM

## 2018-05-17 NOTE — Progress Notes (Signed)
[place] PHYSICAL MEDICINE & REHABILITATION PROGRESS NOTE   Subjective/Complaints:  Left leg incision draining on to blanket. No new pain although leg remains generally sore  ROS: Patient denies fever, rash, sore throat, blurred vision, nausea, vomiting, diarrhea, cough, shortness of breath or chest pain, joint or back pain, headache, or mood change. m  Objective:   No results found. No results for input(s): WBC, HGB, HCT, PLT in the last 72 hours. No results for input(s): NA, K, CL, CO2, GLUCOSE, BUN, CREATININE, CALCIUM in the last 72 hours.  Intake/Output Summary (Last 24 hours) at 05/17/2018 0840 Last data filed at 05/17/2018 0730 Gross per 24 hour  Intake 660 ml  Output -  Net 660 ml     Physical Exam: Vital Signs Blood pressure (!) 140/52, pulse 85, temperature 97.9 F (36.6 C), temperature source Oral, resp. rate 16, height 5' (1.524 m), weight 61.1 kg, SpO2 97 %.  Constitutional: No distress . Vital signs reviewed. HEENT: EOMI, oral membranes moist Neck: supple Cardiovascular: RRR without murmur. No JVD    Respiratory: CTA Bilaterally without wheezes or rales. Normal effort    GI: BS +, non-tender, non-distended  Musculoskeletal:  LLE 1+ edema   Neurological: alert and appropriate Oriented to person place and time.   Motor: LUE 2+ prox to 4+ distally Right upper extremity: 5 proximal distal Left lower extremity: Flexion, knee extension and ankle dorsiflexion 2- to 2/5 and limited by pain, swelling Right lower extremity: Hip flexion, knee extension 4-/5   dorsiflexion 4/5 --no motor changes Skin:  LLE incision with s/s drainage, remains well approximated. Groin wound still with quarter size area of fibronecrotic tissue Psychiatric: pleasant    Assessment/Plan: 1. Functional deficits secondary to debility, PAD which require 3+ hours per day of interdisciplinary therapy in a comprehensive inpatient rehab setting.  Physiatrist is providing close team  supervision and 24 hour management of active medical problems listed below.  Physiatrist and rehab team continue to assess barriers to discharge/monitor patient progress toward functional and medical goals  Care Tool:  Bathing    Body parts bathed by patient: Right arm, Left arm, Chest, Abdomen, Front perineal area, Right upper leg, Left upper leg, Face, Buttocks, Right lower leg   Body parts bathed by helper: Right lower leg, Left lower leg Body parts n/a: Left lower leg   Bathing assist Assist Level: Supervision/Verbal cueing     Upper Body Dressing/Undressing Upper body dressing   What is the patient wearing?: Pull over shirt    Upper body assist Assist Level: Supervision/Verbal cueing    Lower Body Dressing/Undressing Lower body dressing      What is the patient wearing?: Underwear/pull up, Pants     Lower body assist Assist for lower body dressing: Supervision/Verbal cueing     Toileting Toileting    Toileting assist Assist for toileting: Supervision/Verbal cueing     Transfers Chair/bed transfer  Transfers assist     Chair/bed transfer assist level: Supervision/Verbal cueing     Locomotion Ambulation   Ambulation assist      Assist level: Set up assist Assistive device: Walker-rolling Max distance: 150   Walk 10 feet activity   Assist     Assist level: Set up assist Assistive device: Walker-rolling   Walk 50 feet activity   Assist Walk 50 feet with 2 turns activity did not occur: Safety/medical concerns  Assist level: Set up assist Assistive device: Walker-rolling    Walk 150 feet activity   Assist Walk 150 feet  activity did not occur: Safety/medical concerns  Assist level: Set up assist Assistive device: Walker-rolling    Walk 10 feet on uneven surface  activity   Assist     Assist level: Minimal Assistance - Patient > 75% Assistive device: Aeronautical engineer Will patient use wheelchair at  discharge?: No Type of Wheelchair: Manual    Wheelchair assist level: Supervision/Verbal cueing Max wheelchair distance: 50    Wheelchair 50 feet with 2 turns activity    Assist        Assist Level: Supervision/Verbal cueing   Wheelchair 150 feet activity     Assist Wheelchair 150 feet activity did not occur: Safety/medical concerns          Medical Problem List and Plan: 1.  Debility secondary to peripheral vascular disease status post enterectomy left external iliac common femoral superficial artery, left femoral to anterior tibial artery bypass 05/03/2018.  --Continue CIR therapies including PT, OT   2.  DVT Prophylaxis/Anticoagulation: Subcutaneous Lovenox monitor for any bleeding episodes 3. Pain Management: Oxycodone/Ultram as needed  -pain adequately controlled 4. Mood: Provide emotional support 5. Neuropsych: This patient is capable of making decisions on her own behalf. 6. Skin/Wound Care: Routine skin checks, local wound care  -nutrition  -dry dressing to lower leg    -santyl to groin and foot bid  -will ask surgery to follow up re: groin wound. ?debride 7. Fluids/Electrolytes/Nutrition: encourage PO 8.  Leukocytosis/acute on chronic anemia.     Continue iron supplement  -wbc's down to 11.7 10/10, remains afebrile  9.  CKD.  Creatinine baseline 1.25.    10.  Hypertension.  Lisinopril 40 mg daily, Norvasc 10 mg daily, Lopressor 25 mg twice daily Vitals:   05/16/18 2130 05/17/18 0345  BP: (!) 129/47 (!) 140/52  Pulse: 83 85  Resp: 17 16  Temp: 98.6 F (37 C) 97.9 F (36.6 C)  SpO2: 95% 97%  Controlled 05/17/2018 11.  Diastolic congestive heart failure.  Monitor for any signs of fluid overload.  Continue Lasix   Filed Weights   05/15/18 0545 05/16/18 0439 05/17/18 0404  Weight: 60.5 kg 60.6 kg 61.1 kg    -weights stable 10/21 12.  Diabetes mellitus with peripheral neuropathy.  Hemoglobin A1c 7.8.  Glucotrol 5 mg twice daily, NovoLog 3 units 3  times daily with meals.       CBG (last 3)  Recent Labs    05/16/18 1642 05/16/18 2129 05/17/18 0643  GLUCAP 188* 180* 188*   increased glucotrol to 10mg  bid on 10/16--improved control  -further adjustment once home 13.  Hyperlipidemia.  Lipitor 14.  CAD with stenting.  Will discuss with vascular surgery on resuming Plavix as prior to admission.   LOS: 12 days A FACE TO FACE EVALUATION WAS PERFORMED  Meredith Staggers 05/17/2018, 8:40 AM

## 2018-05-17 NOTE — Discharge Summary (Signed)
Discharge summary job [id number]

## 2018-05-17 NOTE — Discharge Summary (Signed)
NAME: Cynthia Ray, Cynthia Ray JK:[id number] ACCOUNT [id number] DATE OF BIRTH:1925/10/07 FACILITY: [facility] LOCATION: [facility] PHYSICIAN:ZACHARY SWARTZ, MD  DISCHARGE SUMMARY  DATE OF DISCHARGE:  05/18/2018  DISCHARGE DIAGNOSES:  1.  Debility secondary to peripheral vascular disease, status post left external iliac, common femoral, superficial artery, left femoral to anterior tibial artery bypass, 05/03/2018. 2.  Subcutaneous Lovenox for deep venous thrombosis prophylaxis. 3.  Pain management. 4.  Acute on chronic anemia.   5.  Chronic kidney disease.   6.  Hypertension.   7.  Diastolic congestive heart failure.   8.  Diabetes mellitus. 9.  Peripheral neuropathy. 10.  Hyperlipidemia. 11.  Coronary artery disease with stenting.  HISTORY OF PRESENT ILLNESS:  This 84 year old right-handed female, history of CAD with stenting, diabetes mellitus, right hip fracture 11/2017 receiving inpatient rehab services,  peripheral vascular disease with multiple revascularization procedures,  maintained on Plavix and aspirin.  Per chart review lives alone in [place], [place].  Used a walker prior to admission.  Daughter across the street assist as needed.  Presented 04/30/2018 with nonhealing ulcers of the left foot.  Recent aortogram  showed subtotal occlusion of left distal femoral artery as well as occlusion of left superficial femoral and popliteal artery.  Underwent left external iliac, common femoral, superficial femoral artery with profundoplasty left femoral to anterior tibial  artery bypass, 05/03/2018 per Dr. Oneida Alar.  HOSPITAL COURSE:  Pain management.  Acute on chronic renal insufficiency.  Creatinine baseline 1.25.  Subcutaneous Lovenox for DVT prophylaxis.  Acute blood loss anemia 5.4.  She was transfused.  Latest hemoglobin 8.5.  Therapies initiated and patient  was admitted for comprehensive rehabilitation program.  PAST MEDICAL HISTORY:  See discharge diagnoses.  SOCIAL HISTORY:   Lives alone, has a daughter next door.  FUNCTIONAL STATUS:  Upon admission to rehab services was minimal assist 35 feet rolling walker, minimal assist with stand pivot transfers, min mod assist with activities of daily living.  PHYSICAL EXAMINATION: VITAL SIGNS:  Blood pressure 126/49, pulse 94, temperature 98, respirations 19. GENERAL:  Alert female in no acute distress. HEENT:  EOMs intact. NECK:  Supple, nontender, no JVD. CARDIOVASCULAR:  Rate controlled. ABDOMEN:  Soft, nontender, good bowel sounds. LUNGS:  Clear to auscultation without wheeze. Incision site, some erythema, nontender.  REHABILITATION HOSPITAL COURSE:  The patient was admitted to inpatient rehabilitation services.  Therapies initiated on a 3-hour daily basis, consisting of physical therapy, occupational therapy and rehabilitation nursing.  The following issues were  addressed during patient's rehabilitation stay:    Pertaining to the debility related to peripheral vascular disease, she had undergone external iliac, common femoral, superficial artery left femoral to anterior tibial artery bypass, 05/03/2018.  Surgical site remained checked by vascular surgery.  She  would follow up outpatient with Dr. Oneida Alar.  Patient underwent generalized debridement of surgical site 05/18/2018 per Dr. Oneida Alar with order for wet-to-dry dressings twice daily at home as well as Levaquin initiated.  A follow-up CBC was completed that was unremarkable.  Subcutaneous Lovenox for DVT prophylaxis.  No bleeding episodes.    CKD, baseline creatinine 1.25.  Followup chemistries noted.    Blood pressure is controlled on Norvasc as well as lisinopril, Lopressor.  No chest pain or shortness of breath.  She exhibited no other signs of fluid overload.  She remained on low dose Lasix.  Blood sugars controlled.  Hemoglobin A1c of 7.8.  She  remained on Glucotrol, Lipitor for hyperlipidemia.    She did have a history of  CAD with stenting her Plavix had  been resumed as well as remaining on aspirin.    The patient received weekly collaborative interdisciplinary team conferences to discuss estimated length of stay, family teaching, any barriers to her discharge.  Stand pivot transfers, rolling walker throughout sessions, supervision, working with energy  conservation techniques.  It was recommended the need for 24-hour supervision for her safety.  Performed short distance ambulation in a controlled environment in the hallway her room, gymnasium.  Activities of daily living and homemaking, sit to stand,  standing balance, functional ambulation with a rolling walker, again addressing any safety awareness.  She could put on her pants with some minimal assistance.  Performed sit to stand, supervision for ADLs.  Family teaching completed and planned  discharge to home.  DISCHARGE MEDICATIONS:  Included Norvasc 10 mg p.o. daily, aspirin 81 mg p.o. daily, Lipitor 80 mg p.o. daily, Levaquin 500 mg daily, vitamin D 1000 units p.o. daily, Plavix 75 mg p.o. daily, Colace 100 mg p.o. daily, Lasix 20 mg p.o. daily, Glucotrol 10 mg p.o. b.i.d.,  Niferex 150 mg p.o. daily, lisinopril 40 mg p.o. daily, magnesium oxide 400 mg p.o. daily, Lopressor 25 mg p.o. b.i.d., Protonix 40 mg p.o. daily, vitamin C 500 mg p.o. daily, Ultram 50 mg twice daily as needed for pain.  Her diet was a diabetic diet.  She would follow up with Dr. Alger Simons at the outpatient rehab service office as needed; Dr. Ruta Hinds vascular surgery, call for appointment; Dr.  Sherrie Sport, medical management.  AN/NUANCE D:05/17/2018 T:05/17/2018 JOB:[id number]

## 2018-05-17 NOTE — Progress Notes (Signed)
Changed groin dressing, old drainage is yellow/green purulent with foul odor.  Cleansed with soap and water, pt states warm washcloth felt good to area, rinsed with warm water, dried thoroughly and santyl applied to yellow slough area to center of incision, then gauze applied and 4x4 gauze over and taped in place.  Call to [place], [place] to inform about foul odor and states vascular is going to look at it before discharge, no further orders at this time.

## 2018-05-17 NOTE — Progress Notes (Signed)
Occupational Therapy Discharge Summary  Patient Details  Name: Cynthia Ray MRN: [id number] Date of Birth: 1925/08/05  Today's Date: 05/17/2018 OT Individual Time: 9518-8416 OT Individual Time Calculation (min): 68 min    Patient has met 10 of 10 long term goals due to improved activity tolerance, improved balance, ability to compensate for deficits and improved coordination.  Patient to discharge at overall Supervision level.  Patient's care partner is independent to provide the necessary physical and cognitive assistance at discharge. OT has recommended 24/7 Supervision at discharge and educated family members several times.    Reasons goals not met: all goals met  Recommendation:  Patient will benefit from ongoing skilled OT services in home health setting to continue to advance functional skills in the area of BADL and iADL.  Equipment: No equipment provided  Reasons for discharge: treatment goals met  Patient/family agrees with progress made and goals achieved: Yes   OT Intervention: Upon entering the room, pt supine in bed and finishing breakfast with no c/o pain this session. Pt agreeable to OT intervention. Pt standing from bed with min verbal cuing for technique with supervision and ambulating to obtain needed clothing items. Pt returning to sit in wheelchair at sink with focus on bathing and dressing tasks. Pt required increased time for rest breaks secondary to fatigue. Pt standing from wheelchair with cuing for technique and standing with close supervision during clothing management and hygiene. Pt needing use of LH reacher to thread pants onto B feet. Pt having drainage from L LE and RN arrived this session for dressing change and assessment. Pt returning to bed at end of session for RN to further assess groin dressings. Pt performed sit >supine with supervision. Call bell and all needed items within reach upon exiting the room.   OT Discharge Precautions/Restrictions   Precautions Precautions: Fall Restrictions Weight Bearing Restrictions: No Pain Pain Assessment Pain Score: 0-No pain Vision Baseline Vision/History: Wears glasses Wears Glasses: At all times Cognition Overall Cognitive Status: Within Functional Limits for tasks assessed Arousal/Alertness: Awake/alert Orientation Level: Oriented X4 Sensation Sensation Light Touch: Appears Intact Peripheral sensation comments: B UEs intact Hot/Cold: Appears Intact Coordination Gross Motor Movements are Fluid and Coordinated: Yes Fine Motor Movements are Fluid and Coordinated: Yes Motor  Motor Motor - Discharge Observations: weakness but has improved since initial evaluation Mobility  Bed Mobility Bed Mobility: Rolling Right;Rolling Left Rolling Right: Supervision/verbal cueing Rolling Left: Supervision/Verbal cueing Transfers Sit to Stand: Supervision/Verbal cueing Stand to Sit: Supervision/Verbal cueing  Trunk/Postural Assessment  Cervical Assessment Cervical Assessment: Exceptions to WFL(forward head) Thoracic Assessment Thoracic Assessment: Exceptions to WFL(rounded shoulders, kyphotic) Lumbar Assessment Lumbar Assessment: Exceptions to WFL(posterior pelvic tilt) Postural Control Postural Control: Deficits on evaluation  Balance Balance Balance Assessed: Yes Dynamic Sitting Balance Dynamic Sitting - Level of Assistance: 5: Stand by assistance Static Standing Balance Static Standing - Level of Assistance: 5: Stand by assistance Dynamic Standing Balance Dynamic Standing - Level of Assistance: 5: Stand by assistance Extremity/Trunk Assessment RUE Assessment RUE Assessment: Exceptions to [facility] Active Range of Motion (AROM) Comments: limited to shoulder elevation at 80 degrees General Strength Comments: within functional limits LUE Assessment LUE Assessment: Exceptions to [facility] Passive Range of Motion (PROM) Comments: 90 degrees shoulder elevation Active Range of Motion (AROM)  Comments: 45 degrees shoulder elevation General Strength Comments: within functional limits   Darleen Crocker P 05/17/2018, 8:40 AM

## 2018-05-18 ENCOUNTER — Ambulatory Visit: Payer: Medicare Other | Admitting: Cardiology

## 2018-05-18 LAB — CBC
HCT: 29.8 % — ABNORMAL LOW (ref 36.0–46.0)
Hemoglobin: 9.3 g/dL — ABNORMAL LOW (ref 12.0–15.0)
MCH: 29.5 pg (ref 26.0–34.0)
MCHC: 31.2 g/dL (ref 30.0–36.0)
MCV: 94.6 fL (ref 80.0–100.0)
Platelets: 392 10*3/uL (ref 150–400)
RBC: 3.15 MIL/uL — ABNORMAL LOW (ref 3.87–5.11)
RDW: 14.8 % (ref 11.5–15.5)
WBC: 8.5 10*3/uL (ref 4.0–10.5)
nRBC: 0 % (ref 0.0–0.2)

## 2018-05-18 LAB — GLUCOSE, CAPILLARY: Glucose-Capillary: 197 mg/dL — ABNORMAL HIGH (ref 70–99)

## 2018-05-18 MED ORDER — VITAMIN C 500 MG PO TABS: 500.0000 mg | ORAL_TABLET | Freq: Every day | ORAL | 0 refills | Status: AC

## 2018-05-18 MED ORDER — PANTOPRAZOLE SODIUM 40 MG PO TBEC: 40.0000 mg | DELAYED_RELEASE_TABLET | Freq: Every day | ORAL | 0 refills | Status: AC

## 2018-05-18 MED ORDER — METOPROLOL TARTRATE 25 MG PO TABS: 25.0000 mg | ORAL_TABLET | Freq: Two times a day (BID) | ORAL | 3 refills | Status: AC

## 2018-05-18 MED ORDER — LEVOFLOXACIN 500 MG PO TABS: 500.0000 mg | ORAL_TABLET | Freq: Every day | ORAL | Status: DC

## 2018-05-18 MED ORDER — GLIPIZIDE 10 MG PO TABS: 10.0000 mg | ORAL_TABLET | Freq: Two times a day (BID) | ORAL | 1 refills | Status: AC

## 2018-05-18 MED ORDER — LEVOFLOXACIN 500 MG PO TABS: 500.0000 mg | ORAL_TABLET | Freq: Every day | ORAL | 0 refills | Status: DC

## 2018-05-18 MED ORDER — FUROSEMIDE 20 MG PO TABS: 20.0000 mg | ORAL_TABLET | Freq: Every day | ORAL | 0 refills | Status: AC

## 2018-05-18 MED ORDER — LISINOPRIL 40 MG PO TABS: 40.0000 mg | ORAL_TABLET | Freq: Every day | ORAL | 6 refills | Status: AC

## 2018-05-18 MED ORDER — DOCUSATE SODIUM 100 MG PO CAPS: 100.0000 mg | ORAL_CAPSULE | Freq: Every day | ORAL | 0 refills | Status: AC

## 2018-05-18 MED ORDER — MAGNESIUM OXIDE 400 MG PO TABS: 400.0000 mg | ORAL_TABLET | Freq: Every day | ORAL | 1 refills | Status: AC

## 2018-05-18 MED ORDER — ATORVASTATIN CALCIUM 80 MG PO TABS: 80.0000 mg | ORAL_TABLET | Freq: Every day | ORAL | 0 refills | Status: AC

## 2018-05-18 MED ORDER — TRAMADOL HCL 50 MG PO TABS: 50.0000 mg | ORAL_TABLET | Freq: Two times a day (BID) | ORAL | 0 refills | Status: AC | PRN

## 2018-05-18 MED ORDER — ACETAMINOPHEN 325 MG PO TABS: 325.0000 mg | ORAL_TABLET | ORAL | Status: AC | PRN

## 2018-05-18 MED ORDER — POLYSACCHARIDE IRON COMPLEX 150 MG PO CAPS: 150.0000 mg | ORAL_CAPSULE | Freq: Every day | ORAL | 1 refills | Status: AC

## 2018-05-18 MED ORDER — CLOPIDOGREL BISULFATE 75 MG PO TABS: 75.0000 mg | ORAL_TABLET | Freq: Every day | ORAL | 1 refills | Status: DC

## 2018-05-18 MED ORDER — AMLODIPINE BESYLATE 10 MG PO TABS: 10.0000 mg | ORAL_TABLET | Freq: Every day | ORAL | 1 refills | Status: DC

## 2018-05-18 NOTE — Discharge Instructions (Signed)
Inpatient Rehab Discharge Instructions  [place] Discharge date and time: No discharge date for patient encounter.   Activities/Precautions/ Functional Status: Activity: activity as tolerated Diet: diabetic diet Wound Care: keep wound clean and dry Functional status:  ___ No restrictions     ___ Walk up steps independently ___ 24/7 supervision/assistance   ___ Walk up steps with assistance ___ Intermittent supervision/assistance  ___ Bathe/dress independently ___ Walk with walker     _x__ Bathe/dress with assistance ___ Walk Independently    ___ Shower independently ___ Walk with assistance    ___ Shower with assistance ___ No alcohol     ___ Return to work/school ________  COMMUNITY REFERRALS UPON DISCHARGE:   Home Health:   PT     OT      RN      SNA  Agency:  [facility] Phone:  [phone] Medical Equipment/Items Ordered:  None ordered, as you have all recommended equipment already at home.   Special Instructions: Dry gauze dressing daily as needed to surgical site   My questions have been answered and I understand these instructions. I will adhere to these goals and the provided educational materials after my discharge from the hospital.  Patient/Caregiver Signature _______________________________ Date __________  Clinician Signature _______________________________________ Date __________  Please bring this form and your medication list with you to all your follow-up doctor's appointments.

## 2018-05-18 NOTE — Progress Notes (Signed)
[place] PHYSICAL MEDICINE & REHABILITATION PROGRESS NOTE   Subjective/Complaints:  Pt without complaints. Excited to be going home  ROS: Patient denies fever, rash, sore throat, blurred vision, nausea, vomiting, diarrhea, cough, shortness of breath or chest pain, joint or back pain, headache, or mood change.   Objective:   No results found. No results for input(s): WBC, HGB, HCT, PLT in the last 72 hours. No results for input(s): NA, K, CL, CO2, GLUCOSE, BUN, CREATININE, CALCIUM in the last 72 hours.  Intake/Output Summary (Last 24 hours) at 05/18/2018 0828 Last data filed at 05/18/2018 0700 Gross per 24 hour  Intake 480 ml  Output -  Net 480 ml     Physical Exam: Vital Signs Blood pressure (!) 126/41, pulse 80, temperature 98.6 F (37 C), temperature source Oral, resp. rate 15, height 5' (1.524 m), weight 62.8 kg, SpO2 95 %.  Constitutional: No distress . Vital signs reviewed. HEENT: EOMI, oral membranes moist Neck: supple Cardiovascular: RRR without murmur. No JVD    Respiratory: CTA Bilaterally without wheezes or rales. Normal effort    GI: BS +, non-tender, non-distended  Musculoskeletal:  LLE 1+ edema   Neurological: alert and appropriate Oriented to person place and time.   Motor: LUE 2+ prox to 4+ distally Right upper extremity: 5 proximal distal Left lower extremity: Flexion, knee extension and ankle dorsiflexion 2- to 2/5 and limited by pain, swelling Right lower extremity: Hip flexion, knee extension 4-/5   dorsiflexion 4/5 --no motor changes Skin:  LLE incision with minimal drainage, remains well approximated. Groin wound still with 1" area of  fibronecrotic tissue, appears softer, some drainage, minimal odor Psychiatric: pleasant    Assessment/Plan: 1. Functional deficits secondary to debility, PAD which require 3+ hours per day of interdisciplinary therapy in a comprehensive inpatient rehab setting.  Physiatrist is providing close team supervision  and 24 hour management of active medical problems listed below.  Physiatrist and rehab team continue to assess barriers to discharge/monitor patient progress toward functional and medical goals  Care Tool:  Bathing    Body parts bathed by patient: Right arm, Left arm, Chest, Abdomen, Front perineal area, Right upper leg, Left upper leg, Face, Buttocks, Right lower leg   Body parts bathed by helper: Right lower leg, Left lower leg Body parts n/a: Left lower leg   Bathing assist Assist Level: Supervision/Verbal cueing     Upper Body Dressing/Undressing Upper body dressing   What is the patient wearing?: Pull over shirt    Upper body assist Assist Level: Supervision/Verbal cueing    Lower Body Dressing/Undressing Lower body dressing      What is the patient wearing?: Underwear/pull up, Pants     Lower body assist Assist for lower body dressing: Supervision/Verbal cueing     Toileting Toileting    Toileting assist Assist for toileting: Supervision/Verbal cueing     Transfers Chair/bed transfer  Transfers assist     Chair/bed transfer assist level: Supervision/Verbal cueing     Locomotion Ambulation   Ambulation assist      Assist level: Supervision/Verbal cueing Assistive device: Walker-rolling Max distance: 100   Walk 10 feet activity   Assist     Assist level: Set up assist Assistive device: Walker-rolling   Walk 50 feet activity   Assist Walk 50 feet with 2 turns activity did not occur: Safety/medical concerns  Assist level: Set up assist Assistive device: Walker-rolling    Walk 150 feet activity   Assist Walk 150 feet activity did  not occur: Safety/medical concerns  Assist level: Set up assist Assistive device: Walker-rolling    Walk 10 feet on uneven surface  activity   Assist     Assist level: Minimal Assistance - Patient > 75% Assistive device: Aeronautical engineer Will patient use wheelchair at  discharge?: No Type of Wheelchair: Manual    Wheelchair assist level: Supervision/Verbal cueing Max wheelchair distance: 50    Wheelchair 50 feet with 2 turns activity    Assist        Assist Level: Supervision/Verbal cueing   Wheelchair 150 feet activity     Assist Wheelchair 150 feet activity did not occur: Safety/medical concerns          Medical Problem List and Plan: 1.  Debility secondary to peripheral vascular disease status post enterectomy left external iliac common femoral superficial artery, left femoral to anterior tibial artery bypass 05/03/2018.  --plan is for discharge today  2.  DVT Prophylaxis/Anticoagulation: Subcutaneous Lovenox monitor for any bleeding episodes 3. Pain Management: Oxycodone/Ultram as needed  -pain adequately controlled 4. Mood: Provide emotional support 5. Neuropsych: This patient is capable of making decisions on her own behalf. 6. Skin/Wound Care: Routine skin checks, local wound care  -nutrition  -dry dressing to lower leg    -santyl to groin and foot bid  -surgical follow up groin area pending this morning. Debridement? 7. Fluids/Electrolytes/Nutrition: encourage PO 8.  Leukocytosis/acute on chronic anemia.     Continue iron supplement  -wbc's down to 11.7 10/10, remains afebrile  9.  CKD.  Creatinine baseline 1.25.    10.  Hypertension.  Lisinopril 40 mg daily, Norvasc 10 mg daily, Lopressor 25 mg twice daily Vitals:   05/17/18 2059 05/18/18 0629  BP: (!) 119/50 (!) 126/41  Pulse: 88 80  Resp: 15 15  Temp: 98.6 F (37 C)   SpO2: 94% 95%  Controlled 05/18/2018 11.  Diastolic congestive heart failure.  Monitor for any signs of fluid overload.  Continue Lasix   Filed Weights   05/16/18 0439 05/17/18 0404 05/18/18 0500  Weight: 60.6 kg 61.1 kg 62.8 kg    -weights stable 10/22 12.  Diabetes mellitus with peripheral neuropathy.  Hemoglobin A1c 7.8.  Glucotrol 5 mg twice daily, NovoLog 3 units 3 times daily with meals.        CBG (last 3)  Recent Labs    05/17/18 1640 05/17/18 2103 05/18/18 0632  GLUCAP 146* 204* 197*    -inadequate control  -resume metformin 500mg  bid at discharge, titrate up to 1000mg  bid home dose based on cbg's  -reduce glucotrol to 5mg  bid (home dose) 13.  Hyperlipidemia.  Lipitor 14.  CAD with stenting.  Will discuss with vascular surgery on resuming Plavix as prior to admission.   LOS: 13 days A FACE TO FACE EVALUATION WAS PERFORMED  Meredith Staggers 05/18/2018, 8:28 AM

## 2018-05-18 NOTE — Progress Notes (Signed)
Social Work Discharge Note  The overall goal for the admission was met for:   Discharge location: Yes - home with family to provide supervision/assistance  Length of Stay: Yes - 13 days  Discharge activity level: Yes - supervision  Home/community participation: Yes  Services provided included: MD, RD, PT, OT, RN, Pharmacy and SW  Financial Services: Medicare and Private Insurance: secondary  Follow-up services arranged: Home Health: PT/OT/RN  and Patient/Family request agency HH: [place], DME: Pt has all recommended DME at home already.  Comments (or additional information):  Pt's dtr received family education and knows of pt's need for supervision at d/c.    Patient/Family verbalized understanding of follow-up arrangements: Yes  Individual responsible for coordination of the follow-up plan: pt and her dtr and niece  Confirmed correct DME delivered: Trey Sailors 05/18/2018    Demitrious Mccannon, Silvestre Mesi

## 2018-05-18 NOTE — Progress Notes (Addendum)
[facility]  Subjective  - Doing well with Rehab and plans on discharge today.   Objective (!) 122/55 87 98.6 F (37 C) (Oral) 15 95%  Intake/Output Summary (Last 24 hours) at 05/18/2018 0921 Last data filed at 05/18/2018 0700 Gross per 24 hour  Intake 480 ml  Output -  Net 480 ml    Left groin distal incision with fat necrosis Probed with Q tip no tunnel, no purulent drainage.  No visible graft material.      Left LE palpable graft pulse lateral popliteal area.  Doppler AT/DP left LE  Assessment/Planning: Right Fem-AT with hybrid vein and propaten  She has been afebrile WBC 11.7 this was on 05/06/2018 I will order a STAT CBC to check this. She does have propaten graft at the groin anastomosis. She has a f/u appt. 06/03/2018 at our office.  I have asked Dr. Oneida Alar to examine her and give his opinion as well Prior to her going home today.  Roxy Horseman 05/18/2018 9:21 AM --   Did superficial debridement of central portion of left groin wound.  No graft exposed so far. Will need local wound care NS wet to dry BID with home health Will start Levaquin today  Ruta Hinds, MD [facility] [place]: [phone] Pager: [phone]    Laboratory Lab Results: No results for input(s): WBC, HGB, HCT, PLT in the last 72 hours. BMET No results for input(s): NA, K, CL, CO2, GLUCOSE, BUN, CREATININE, CALCIUM in the last 72 hours.  COAG Lab Results  Component Value Date   INR 1.05 05/02/2018   INR 1.08 03/17/2012   INR 1.03 01/19/2012   No results found for: PTT

## 2018-05-18 NOTE — Progress Notes (Signed)
Pt discharged home accompanied by family. Pain med given as needed. Dressings to left groin and left foot changed prior to discharge. Non adherent gauze and Kerlix applied to left lower leg. Instructions given to family on wound care and dressing change. Pt is stable at time of discharge. No further questions from pt or family.  Cynthia Ray Cynthia Ray

## 2018-05-18 NOTE — Plan of Care (Signed)
  Problem: RH SKIN INTEGRITY Goal: RH STG SKIN FREE OF INFECTION/BREAKDOWN Description With min assist  Outcome: Progressing Goal: RH STG ABLE TO PERFORM INCISION/WOUND CARE W/ASSISTANCE Description STG Able To Perform Incision/Wound Care With Assistance Min.  Outcome: Progressing   Problem: RH SAFETY Goal: RH STG ADHERE TO SAFETY PRECAUTIONS W/ASSISTANCE/DEVICE Description STG Adhere to Safety Precautions With Assistance/Device. Mod I  Outcome: Progressing   Problem: RH PAIN MANAGEMENT Goal: RH STG PAIN MANAGED AT OR BELOW PT'S PAIN GOAL Description Less than 3   Outcome: Progressing

## 2018-05-19 DIAGNOSIS — S91302A Unspecified open wound, left foot, initial encounter: Secondary | ICD-10-CM | POA: Diagnosis not present

## 2018-05-19 DIAGNOSIS — Z7902 Long term (current) use of antithrombotics/antiplatelets: Secondary | ICD-10-CM | POA: Diagnosis not present

## 2018-05-19 DIAGNOSIS — Z7984 Long term (current) use of oral hypoglycemic drugs: Secondary | ICD-10-CM | POA: Diagnosis not present

## 2018-05-19 DIAGNOSIS — I2510 Atherosclerotic heart disease of native coronary artery without angina pectoris: Secondary | ICD-10-CM | POA: Diagnosis not present

## 2018-05-19 DIAGNOSIS — I129 Hypertensive chronic kidney disease with stage 1 through stage 4 chronic kidney disease, or unspecified chronic kidney disease: Secondary | ICD-10-CM | POA: Diagnosis not present

## 2018-05-19 DIAGNOSIS — L97521 Non-pressure chronic ulcer of other part of left foot limited to breakdown of skin: Secondary | ICD-10-CM | POA: Diagnosis not present

## 2018-05-19 DIAGNOSIS — E1122 Type 2 diabetes mellitus with diabetic chronic kidney disease: Secondary | ICD-10-CM | POA: Diagnosis not present

## 2018-05-19 DIAGNOSIS — J984 Other disorders of lung: Secondary | ICD-10-CM | POA: Diagnosis not present

## 2018-05-19 DIAGNOSIS — T8149XA Infection following a procedure, other surgical site, initial encounter: Secondary | ICD-10-CM | POA: Diagnosis not present

## 2018-05-19 DIAGNOSIS — E1165 Type 2 diabetes mellitus with hyperglycemia: Secondary | ICD-10-CM | POA: Diagnosis not present

## 2018-05-19 DIAGNOSIS — N189 Chronic kidney disease, unspecified: Secondary | ICD-10-CM | POA: Diagnosis present

## 2018-05-19 DIAGNOSIS — E1140 Type 2 diabetes mellitus with diabetic neuropathy, unspecified: Secondary | ICD-10-CM | POA: Diagnosis present

## 2018-05-19 DIAGNOSIS — S72001D Fracture of unspecified part of neck of right femur, subsequent encounter for closed fracture with routine healing: Secondary | ICD-10-CM | POA: Diagnosis not present

## 2018-05-19 DIAGNOSIS — Z7982 Long term (current) use of aspirin: Secondary | ICD-10-CM | POA: Diagnosis not present

## 2018-05-19 DIAGNOSIS — R600 Localized edema: Secondary | ICD-10-CM | POA: Diagnosis not present

## 2018-05-19 DIAGNOSIS — Z87891 Personal history of nicotine dependence: Secondary | ICD-10-CM | POA: Diagnosis not present

## 2018-05-19 DIAGNOSIS — E871 Hypo-osmolality and hyponatremia: Secondary | ICD-10-CM | POA: Diagnosis not present

## 2018-05-19 DIAGNOSIS — E1151 Type 2 diabetes mellitus with diabetic peripheral angiopathy without gangrene: Secondary | ICD-10-CM | POA: Diagnosis present

## 2018-05-19 DIAGNOSIS — Z79899 Other long term (current) drug therapy: Secondary | ICD-10-CM | POA: Diagnosis not present

## 2018-05-19 DIAGNOSIS — E785 Hyperlipidemia, unspecified: Secondary | ICD-10-CM | POA: Diagnosis present

## 2018-05-19 DIAGNOSIS — I5040 Unspecified combined systolic (congestive) and diastolic (congestive) heart failure: Secondary | ICD-10-CM | POA: Diagnosis not present

## 2018-05-19 DIAGNOSIS — I110 Hypertensive heart disease with heart failure: Secondary | ICD-10-CM | POA: Diagnosis not present

## 2018-05-19 DIAGNOSIS — L03116 Cellulitis of left lower limb: Secondary | ICD-10-CM | POA: Diagnosis not present

## 2018-05-19 DIAGNOSIS — E11621 Type 2 diabetes mellitus with foot ulcer: Secondary | ICD-10-CM | POA: Diagnosis not present

## 2018-05-19 DIAGNOSIS — R944 Abnormal results of kidney function studies: Secondary | ICD-10-CM | POA: Diagnosis not present

## 2018-05-25 DIAGNOSIS — I5040 Unspecified combined systolic (congestive) and diastolic (congestive) heart failure: Secondary | ICD-10-CM | POA: Diagnosis not present

## 2018-05-25 DIAGNOSIS — S72001D Fracture of unspecified part of neck of right femur, subsequent encounter for closed fracture with routine healing: Secondary | ICD-10-CM | POA: Diagnosis not present

## 2018-05-25 DIAGNOSIS — E11621 Type 2 diabetes mellitus with foot ulcer: Secondary | ICD-10-CM | POA: Diagnosis not present

## 2018-05-25 DIAGNOSIS — I110 Hypertensive heart disease with heart failure: Secondary | ICD-10-CM | POA: Diagnosis not present

## 2018-05-25 DIAGNOSIS — L97521 Non-pressure chronic ulcer of other part of left foot limited to breakdown of skin: Secondary | ICD-10-CM | POA: Diagnosis not present

## 2018-05-25 DIAGNOSIS — I2510 Atherosclerotic heart disease of native coronary artery without angina pectoris: Secondary | ICD-10-CM | POA: Diagnosis not present

## 2018-05-28 DIAGNOSIS — I5040 Unspecified combined systolic (congestive) and diastolic (congestive) heart failure: Secondary | ICD-10-CM | POA: Diagnosis not present

## 2018-05-28 DIAGNOSIS — S72001D Fracture of unspecified part of neck of right femur, subsequent encounter for closed fracture with routine healing: Secondary | ICD-10-CM | POA: Diagnosis not present

## 2018-05-28 DIAGNOSIS — E11621 Type 2 diabetes mellitus with foot ulcer: Secondary | ICD-10-CM | POA: Diagnosis not present

## 2018-05-28 DIAGNOSIS — L97521 Non-pressure chronic ulcer of other part of left foot limited to breakdown of skin: Secondary | ICD-10-CM | POA: Diagnosis not present

## 2018-05-28 DIAGNOSIS — I110 Hypertensive heart disease with heart failure: Secondary | ICD-10-CM | POA: Diagnosis not present

## 2018-05-28 DIAGNOSIS — I2510 Atherosclerotic heart disease of native coronary artery without angina pectoris: Secondary | ICD-10-CM | POA: Diagnosis not present

## 2018-05-31 DIAGNOSIS — L97521 Non-pressure chronic ulcer of other part of left foot limited to breakdown of skin: Secondary | ICD-10-CM | POA: Diagnosis not present

## 2018-05-31 DIAGNOSIS — I110 Hypertensive heart disease with heart failure: Secondary | ICD-10-CM | POA: Diagnosis not present

## 2018-05-31 DIAGNOSIS — I5040 Unspecified combined systolic (congestive) and diastolic (congestive) heart failure: Secondary | ICD-10-CM | POA: Diagnosis not present

## 2018-05-31 DIAGNOSIS — E11621 Type 2 diabetes mellitus with foot ulcer: Secondary | ICD-10-CM | POA: Diagnosis not present

## 2018-05-31 DIAGNOSIS — S72001D Fracture of unspecified part of neck of right femur, subsequent encounter for closed fracture with routine healing: Secondary | ICD-10-CM | POA: Diagnosis not present

## 2018-05-31 DIAGNOSIS — I2510 Atherosclerotic heart disease of native coronary artery without angina pectoris: Secondary | ICD-10-CM | POA: Diagnosis not present

## 2018-06-01 ENCOUNTER — Encounter: Payer: Self-pay | Admitting: *Deleted

## 2018-06-01 ENCOUNTER — Ambulatory Visit (INDEPENDENT_AMBULATORY_CARE_PROVIDER_SITE_OTHER): Payer: Medicare Other | Admitting: Cardiology

## 2018-06-01 ENCOUNTER — Encounter: Payer: Self-pay | Admitting: Cardiology

## 2018-06-01 VITALS — BP 138/52 | HR 71 | Ht 60.0 in | Wt 137.8 lb

## 2018-06-01 DIAGNOSIS — I739 Peripheral vascular disease, unspecified: Secondary | ICD-10-CM

## 2018-06-01 DIAGNOSIS — E782 Mixed hyperlipidemia: Secondary | ICD-10-CM | POA: Diagnosis not present

## 2018-06-01 DIAGNOSIS — I10 Essential (primary) hypertension: Secondary | ICD-10-CM | POA: Diagnosis not present

## 2018-06-01 DIAGNOSIS — I779 Disorder of arteries and arterioles, unspecified: Secondary | ICD-10-CM | POA: Diagnosis not present

## 2018-06-01 DIAGNOSIS — I2510 Atherosclerotic heart disease of native coronary artery without angina pectoris: Secondary | ICD-10-CM | POA: Diagnosis not present

## 2018-06-01 NOTE — Progress Notes (Signed)
Clinical Summary Cynthia Ray is a 84 y.o.female seen today for follow up of the following medical problems.   1. CAD  - MPI 12/2011 as part of preoperative vascular surgery evaluation, found to have inferior wall ischemia with normal LVEF  - referred for cath, received a BMS to her RCA  - she is not on ASA because of prior anemia and GI bleed    - no recent chest pain, no SOB/DOE  - compliant with meds. Restarted on aspirin over the last few months by another provider  2. HTN  - compliant with meds  3. Hyperlipidemia  - compliant with statin - upcoming labs with pcp  4. PAD  - followed by vascular, prior stenting, prior fem-pop bypass Aug 2013 - 04/2018 left lower extremity bypass for nonhealing ulcer  5. LE edema - on diuretics previously had significant electrolyte abnormalities including HypoNa, hypoK, and hypoMg.  - currently on low dose lasix, has tolerated    Past Medical History:  Diagnosis Date  . Anemia   . Arthritis    "dr said I have it in my back" Lower   . Carotid artery occlusion   . CHF (congestive heart failure) ([place]) 12/2017  . COPD (chronic obstructive pulmonary disease) ([place])   . Fractured hip ([place])    Fell and hurt R hip  . GERD (gastroesophageal reflux disease)   . History of blood transfusion 11/11/11  . Hyperlipidemia   . Hypertension   . PVD (peripheral vascular disease) ([place])    Stents left [facility] 2004 Dr. Albertine Patricia  . Type II diabetes mellitus ([facility])      Allergies  Allergen Reactions  . Ibuprofen Other (See Comments)    Dr. Britta Mccreedy informed patient not to take this due to ulcer      Current Outpatient Medications  Medication Sig Dispense Refill  . acetaminophen (TYLENOL) 325 MG tablet Take 1-2 tablets (325-650 mg total) by mouth every 4 (four) hours as needed for mild pain (or temp >/= 101 F).    Marland Kitchen amLODipine (NORVASC) 10 MG tablet Take 1 tablet (10 mg total) by mouth daily. 90 tablet 1  . aspirin 81 MG chewable tablet  Chew 81 mg by mouth daily.     Marland Kitchen atorvastatin (LIPITOR) 80 MG tablet Take 1 tablet (80 mg total) by mouth daily. 30 tablet 0  . calcium carbonate (TUMS - DOSED IN MG ELEMENTAL CALCIUM) 500 MG chewable tablet Chew 1 tablet by mouth daily.    . cholecalciferol (VITAMIN D) 1000 UNITS tablet Take 1,000 Units by mouth daily.    . clopidogrel (PLAVIX) 75 MG tablet Take 1 tablet (75 mg total) by mouth daily. 90 tablet 1  . docusate sodium (COLACE) 100 MG capsule Take 1 capsule (100 mg total) by mouth daily. 10 capsule 0  . furosemide (LASIX) 20 MG tablet Take 1 tablet (20 mg total) by mouth daily. 30 tablet 0  . glipiZIDE (GLUCOTROL) 10 MG tablet Take 1 tablet (10 mg total) by mouth 2 (two) times daily before a meal. 60 tablet 1  . iron polysaccharides (NIFEREX) 150 MG capsule Take 1 capsule (150 mg total) by mouth daily. 30 capsule 1  . levofloxacin (LEVAQUIN) 500 MG tablet Take 1 tablet (500 mg total) by mouth daily. 14 tablet 0  . lisinopril (PRINIVIL,ZESTRIL) 40 MG tablet Take 1 tablet (40 mg total) by mouth daily. 30 tablet 6  . magnesium oxide (MAG-OX) 400 MG tablet Take 1 tablet (400 mg total) by  mouth daily. 30 tablet 1  . metoprolol tartrate (LOPRESSOR) 25 MG tablet Take 1 tablet (25 mg total) by mouth 2 (two) times daily. 180 tablet 3  . nitroGLYCERIN (NITROSTAT) 0.4 MG SL tablet Place 0.4 mg under the tongue every 5 (five) minutes as needed for chest pain.    . pantoprazole (PROTONIX) 40 MG tablet Take 1 tablet (40 mg total) by mouth daily at 10 pm. 30 tablet 0  . traMADol (ULTRAM) 50 MG tablet Take 1 tablet (50 mg total) by mouth 2 (two) times daily as needed for moderate pain. 30 tablet 0  . vitamin C (ASCORBIC ACID) 500 MG tablet Take 1 tablet (500 mg total) by mouth daily. 30 tablet 0   No current facility-administered medications for this visit.      Past Surgical History:  Procedure Laterality Date  . ABDOMINAL AORTAGRAM N/A 10/31/2011   Procedure: ABDOMINAL Maxcine Ham;  Surgeon:  Elam Dutch, MD;  Location: [facility] CATH LAB;  Service: Cardiovascular;  Laterality: N/A;  . ABDOMINAL AORTOGRAM W/LOWER EXTREMITY N/A 04/16/2018   Procedure: ABDOMINAL AORTOGRAM W/LOWER EXTREMITY;  Surgeon: Elam Dutch, MD;  Location: [facility] CV LAB;  Service: Cardiovascular;  Laterality: N/A;  . AMPUTATION  03/23/2012   Procedure: AMPUTATION DIGIT;  Surgeon: Elam Dutch, MD;  Location: [facility] OR;  Service: Vascular;  Laterality: Right;  Right Femoral endarterectomy with profundaplasty; right femoral-popliteal bypass with nonreversed saphenous vein; and right first toe amputation  . CATARACT EXTRACTION W/ INTRAOCULAR LENS  IMPLANT, BILATERAL  2002  . COLONOSCOPY    . CORONARY ANGIOPLASTY WITH STENT PLACEMENT  01/19/12   "1; first one I've ever had"  . ENDARTERECTOMY FEMORAL Left 05/03/2018   Procedure: LEFT FEMORAL ENDARTERECTOMY WITH PROFUNDAPLASTY;  Surgeon: Elam Dutch, MD;  Location: [place];  Service: Vascular;  Laterality: Left;  . EYE SURGERY  2009   Laser bilateral   . FEMORAL ENDARTERECTOMY  03/23/2012   right  . FEMORAL-POPLITEAL BYPASS GRAFT  03/23/2012   Procedure: BYPASS GRAFT FEMORAL-POPLITEAL ARTERY;  Surgeon: Elam Dutch, MD;  Location: [facility] OR;  Service: Vascular;  Laterality: Right;  Right Femoral endarterectomy with profundaplasty; right femoral-popliteal bypass with nonreversed saphenous vein; and right first toe amputation  . FEMORAL-TIBIAL BYPASS GRAFT Left 05/03/2018   Procedure: LEFT  FEMORAL TO ANTERIOR TIBIAL ARTERY BYPASS WITH COMPOSITE PTFE GRAFT AND REVERSED LEFT SAPHENOUS VEIN GRAFT.;  Surgeon: Elam Dutch, MD;  Location: [facility];  Service: Vascular;  Laterality: Left;  . HIP FRACTURE SURGERY Right 11/2017  . PATCH ANGIOPLASTY Left 05/03/2018   Procedure: PATCH ANGIOPLASTY LEFT FEMORAL ARTERY;  Surgeon: Elam Dutch, MD;  Location: [place];  Service: Vascular;  Laterality: Left;  . PERCUTANEOUS CORONARY STENT INTERVENTION (PCI-S) N/A 01/19/2012    Procedure: PERCUTANEOUS CORONARY STENT INTERVENTION (PCI-S);  Surgeon: Peter M Martinique, MD;  Location: [facility] CATH LAB;  Service: Cardiovascular;  Laterality: N/A;  . PERIPHERAL ARTERIAL STENT GRAFT  2003   LLE  . POSTERIOR LAMINECTOMY / DECOMPRESSION LUMBAR SPINE  1989  . REFRACTIVE SURGERY  2009   bilaterally  . TONSILLECTOMY AND ADENOIDECTOMY  1946     Allergies  Allergen Reactions  . Ibuprofen Other (See Comments)    Dr. Britta Mccreedy informed patient not to take this due to ulcer       Family History  Problem Relation Age of Onset  . Stroke Father 78  . Hypertension Father   . Stroke Mother 1  . Diabetes Mother   . Hypertension  Mother   . Diabetes Sister   . Hyperlipidemia Sister   . Hypertension Sister   . Diabetes Brother   . Hypertension Brother      Social History Cynthia Ray reports that she quit smoking about 30 years ago. Her smoking use included cigarettes. She started smoking about 54 years ago. She has a 24.00 pack-year smoking history. She has never used smokeless tobacco. Cynthia Ray reports that she does not drink alcohol.   Review of Systems CONSTITUTIONAL: No weight loss, fever, chills, weakness or fatigue.  HEENT: Eyes: No visual loss, blurred vision, double vision or yellow sclerae.No hearing loss, sneezing, congestion, runny nose or sore throat.  SKIN: No rash or itching.  CARDIOVASCULAR: per hpi RESPIRATORY: No shortness of breath, cough or sputum.  GASTROINTESTINAL: No anorexia, nausea, vomiting or diarrhea. No abdominal pain or blood.  GENITOURINARY: No burning on urination, no polyuria NEUROLOGICAL: No headache, dizziness, syncope, paralysis, ataxia, numbness or tingling in the extremities. No change in bowel or bladder control.  MUSCULOSKELETAL: No muscle, back pain, joint pain or stiffness.  LYMPHATICS: No enlarged nodes. No history of splenectomy.  PSYCHIATRIC: No history of depression or anxiety.  ENDOCRINOLOGIC: No reports of sweating, cold or heat  intolerance. No polyuria or polydipsia.  Marland Kitchen   Physical Examination Vitals:   06/01/18 1300  BP: (!) 138/52  Pulse: 71  SpO2: 95%   Vitals:   06/01/18 1300  Weight: 137 lb 12.8 oz (62.5 kg)  Height: 5' (1.524 m)    Gen: resting comfortably, no acute distress HEENT: no scleral icterus, pupils equal round and reactive, no palptable cervical adenopathy,  CV: RRR, 2/6 systolic murmur rusb, no jvd Resp: Clear to auscultation bilaterally GI: abdomen is soft, non-tender, non-distended, normal bowel sounds, no hepatosplenomegaly MSK: extremities are warm, 1+ bilateral LE edema Skin: warm, no rash Neuro:  no focal deficits Psych: appropriate affect   Diagnostic Studies 12/2011 Stress MPI: inferior wall ischemia, normal LVEF   12/2011 [facility] Procedural Findings: Hemodynamics: AO179/62  LV192/12  Coronary angiography:  Coronary dominance: Right  Left mainstem: Moderately calcified. Distal 60%.  Left anterior descending (LAD): Probable ostial 60% (difficult to fully lay out the ostial circ/LAD/RI).  [place] Intermedius: Moderate sized. It appears to be free of high grade disease although it is difficult to visualize the ostium.  Left circumflex (LCx):AV groove ostial 40%. OM1 moderate with ostial 50%. OM2 moderate and branching without high grade disease  Right coronary artery (RCA): Large. Heavy calcification. Long proximal 30%. Focal mid 99% followed by 95%. PDA small to moderate and normal. PL x 2 small to moderate and normal.  Left ventriculography: Left ventricular systolic function is normal, LVEF is estimated at 65%, there is no significant mitral regurgitation  Final Conclusions: Heavy valve calcification. Severe single vessel CAD with moderate disease elsewhere  Recommendations: I will review with colleagues for consideration of PCI of the RCA before possible lower extremity surgery.   Lesion Data:  Vessel: Mid RCA  Percent stenosis (pre): 90%   TIMI-flow (pre): 3  Stent: 2.5 x 23 mm MultiLink mini vision stent  Percent stenosis (post): 0%  TIMI-flow (post): 3  Conclusions:  1. Successful intracoronary stenting of the mid right coronary with a bare-metal stent. Unable to cross the lesion at the crux.   06/15/13 Clinic EKG: sinus rhythm, normal axis, no ischemic changes   06/2013 Echo LVEF 65-60%, mild LVH, grade I diastolic dysfunction, aortic sclerosis without stenosis    Assessment and Plan  1. CAD  -  no symptoms, continue current meds - defer DAPT to vascular, from cardiac standpoint she had been only on plavix in the past due to previous GI issues on ASA. Has been back on ASA 6 months without troubles, from cardiac standpoint only needs single agent.   2. Hyperlipidemia  - cotninue statin, f/u pcp labs  3. HTN  -- reasonable control today, from recent admission bp trends were overall at goal - continue current meds  4. PAD  - continue to follow with vascular, recovering from recent bypass  5. LE edema  - chronic, mild today. Avoid aggressive diuretic dosing due to prior complicationsin the past.   F/u 6 months    Arnoldo Lenis, M.D.

## 2018-06-01 NOTE — Patient Instructions (Signed)

## 2018-06-02 DIAGNOSIS — L03116 Cellulitis of left lower limb: Secondary | ICD-10-CM | POA: Diagnosis not present

## 2018-06-02 DIAGNOSIS — E11621 Type 2 diabetes mellitus with foot ulcer: Secondary | ICD-10-CM | POA: Diagnosis not present

## 2018-06-02 DIAGNOSIS — L97521 Non-pressure chronic ulcer of other part of left foot limited to breakdown of skin: Secondary | ICD-10-CM | POA: Diagnosis not present

## 2018-06-02 DIAGNOSIS — M545 Low back pain: Secondary | ICD-10-CM | POA: Diagnosis not present

## 2018-06-02 DIAGNOSIS — I110 Hypertensive heart disease with heart failure: Secondary | ICD-10-CM | POA: Diagnosis not present

## 2018-06-02 DIAGNOSIS — I5040 Unspecified combined systolic (congestive) and diastolic (congestive) heart failure: Secondary | ICD-10-CM | POA: Diagnosis not present

## 2018-06-02 DIAGNOSIS — I2510 Atherosclerotic heart disease of native coronary artery without angina pectoris: Secondary | ICD-10-CM | POA: Diagnosis not present

## 2018-06-02 DIAGNOSIS — S72001D Fracture of unspecified part of neck of right femur, subsequent encounter for closed fracture with routine healing: Secondary | ICD-10-CM | POA: Diagnosis not present

## 2018-06-03 ENCOUNTER — Other Ambulatory Visit: Payer: Self-pay

## 2018-06-03 ENCOUNTER — Ambulatory Visit (INDEPENDENT_AMBULATORY_CARE_PROVIDER_SITE_OTHER): Payer: Self-pay | Admitting: Vascular Surgery

## 2018-06-03 ENCOUNTER — Encounter: Payer: Self-pay | Admitting: Vascular Surgery

## 2018-06-03 VITALS — BP 144/65 | HR 79 | Temp 97.7°F | Resp 20 | Ht 60.0 in | Wt 137.0 lb

## 2018-06-03 DIAGNOSIS — I2510 Atherosclerotic heart disease of native coronary artery without angina pectoris: Secondary | ICD-10-CM | POA: Diagnosis not present

## 2018-06-03 DIAGNOSIS — I110 Hypertensive heart disease with heart failure: Secondary | ICD-10-CM | POA: Diagnosis not present

## 2018-06-03 DIAGNOSIS — S72001D Fracture of unspecified part of neck of right femur, subsequent encounter for closed fracture with routine healing: Secondary | ICD-10-CM | POA: Diagnosis not present

## 2018-06-03 DIAGNOSIS — L97521 Non-pressure chronic ulcer of other part of left foot limited to breakdown of skin: Secondary | ICD-10-CM | POA: Diagnosis not present

## 2018-06-03 DIAGNOSIS — E11621 Type 2 diabetes mellitus with foot ulcer: Secondary | ICD-10-CM | POA: Diagnosis not present

## 2018-06-03 DIAGNOSIS — I5040 Unspecified combined systolic (congestive) and diastolic (congestive) heart failure: Secondary | ICD-10-CM | POA: Diagnosis not present

## 2018-06-03 DIAGNOSIS — I739 Peripheral vascular disease, unspecified: Secondary | ICD-10-CM

## 2018-06-03 MED ORDER — CEPHALEXIN 500 MG PO CAPS: 500.0000 mg | ORAL_CAPSULE | Freq: Three times a day (TID) | ORAL | 0 refills | Status: AC

## 2018-06-03 MED ORDER — CEPHALEXIN 500 MG PO CAPS: 500.0000 mg | ORAL_CAPSULE | Freq: Three times a day (TID) | ORAL | 0 refills | Status: DC

## 2018-06-03 NOTE — Progress Notes (Signed)
Patient is an 84 year old female who returns for postoperative follow-up today.  She recently underwent left common femoral endarterectomy with profundoplasty and left femoral to anterior tibial artery bypass with a composite PTFE and vein graft 05/03/18.  She spent several weeks and rehab with deconditioning.  She has had some difficulty healing up her left groin incision.  She denies fever or chills.  She is doing local wound care on the left groin.  She ambulates some to the bathroom but has been minimally ambulatory since discharge from the hospital.  Her family is arranging for a physical therapist to return to the home.  Physical exam:  Vitals:   06/03/18 0914  BP: (!) 144/65  Pulse: 79  Resp: 20  Temp: 97.7 F (36.5 C)  TempSrc: Oral  SpO2: 93%  Weight: 137 lb (62.1 kg)  Height: 5' (1.524 m)    Extremities: 2 cm x 2 cm x 2 cm depth opening in the left groin 80% granulation tissue small area of fibrinous exudate debrided today drainage no graft exposed, easily palpable graft pulse lateral aspect of left leg healing pretibial and left first toe wound, some suture material irritation in several of her medial leg saphenectomy incisions  Sutures were removed from her lateral calf anterior tibial incision today.  Assessment: Patent anterior tibial bypass left leg.  Slowly healing left groin wound currently no evidence of graft infection continue local wound care.  We will also keep the patient on Keflex antibiotics for now since she has prosthetic graft.  Plan: See above follow-up in 2 weeks for recheck of groin wound  Ruta Hinds, MD [facility] [place] Office: [phone] Pager: [phone]

## 2018-06-04 DIAGNOSIS — E11621 Type 2 diabetes mellitus with foot ulcer: Secondary | ICD-10-CM | POA: Diagnosis not present

## 2018-06-04 DIAGNOSIS — S72001D Fracture of unspecified part of neck of right femur, subsequent encounter for closed fracture with routine healing: Secondary | ICD-10-CM | POA: Diagnosis not present

## 2018-06-04 DIAGNOSIS — I5040 Unspecified combined systolic (congestive) and diastolic (congestive) heart failure: Secondary | ICD-10-CM | POA: Diagnosis not present

## 2018-06-04 DIAGNOSIS — I110 Hypertensive heart disease with heart failure: Secondary | ICD-10-CM | POA: Diagnosis not present

## 2018-06-04 DIAGNOSIS — I2510 Atherosclerotic heart disease of native coronary artery without angina pectoris: Secondary | ICD-10-CM | POA: Diagnosis not present

## 2018-06-04 DIAGNOSIS — L97521 Non-pressure chronic ulcer of other part of left foot limited to breakdown of skin: Secondary | ICD-10-CM | POA: Diagnosis not present

## 2018-06-07 DIAGNOSIS — S72001D Fracture of unspecified part of neck of right femur, subsequent encounter for closed fracture with routine healing: Secondary | ICD-10-CM | POA: Diagnosis not present

## 2018-06-07 DIAGNOSIS — I110 Hypertensive heart disease with heart failure: Secondary | ICD-10-CM | POA: Diagnosis not present

## 2018-06-07 DIAGNOSIS — E11621 Type 2 diabetes mellitus with foot ulcer: Secondary | ICD-10-CM | POA: Diagnosis not present

## 2018-06-07 DIAGNOSIS — L97521 Non-pressure chronic ulcer of other part of left foot limited to breakdown of skin: Secondary | ICD-10-CM | POA: Diagnosis not present

## 2018-06-07 DIAGNOSIS — I2510 Atherosclerotic heart disease of native coronary artery without angina pectoris: Secondary | ICD-10-CM | POA: Diagnosis not present

## 2018-06-07 DIAGNOSIS — I5040 Unspecified combined systolic (congestive) and diastolic (congestive) heart failure: Secondary | ICD-10-CM | POA: Diagnosis not present

## 2018-06-08 DIAGNOSIS — I5040 Unspecified combined systolic (congestive) and diastolic (congestive) heart failure: Secondary | ICD-10-CM | POA: Diagnosis not present

## 2018-06-08 DIAGNOSIS — I110 Hypertensive heart disease with heart failure: Secondary | ICD-10-CM | POA: Diagnosis not present

## 2018-06-08 DIAGNOSIS — L97521 Non-pressure chronic ulcer of other part of left foot limited to breakdown of skin: Secondary | ICD-10-CM | POA: Diagnosis not present

## 2018-06-08 DIAGNOSIS — I2510 Atherosclerotic heart disease of native coronary artery without angina pectoris: Secondary | ICD-10-CM | POA: Diagnosis not present

## 2018-06-08 DIAGNOSIS — S72001D Fracture of unspecified part of neck of right femur, subsequent encounter for closed fracture with routine healing: Secondary | ICD-10-CM | POA: Diagnosis not present

## 2018-06-08 DIAGNOSIS — E11621 Type 2 diabetes mellitus with foot ulcer: Secondary | ICD-10-CM | POA: Diagnosis not present

## 2018-06-09 DIAGNOSIS — I2510 Atherosclerotic heart disease of native coronary artery without angina pectoris: Secondary | ICD-10-CM | POA: Diagnosis not present

## 2018-06-09 DIAGNOSIS — L97521 Non-pressure chronic ulcer of other part of left foot limited to breakdown of skin: Secondary | ICD-10-CM | POA: Diagnosis not present

## 2018-06-09 DIAGNOSIS — E11621 Type 2 diabetes mellitus with foot ulcer: Secondary | ICD-10-CM | POA: Diagnosis not present

## 2018-06-09 DIAGNOSIS — I110 Hypertensive heart disease with heart failure: Secondary | ICD-10-CM | POA: Diagnosis not present

## 2018-06-09 DIAGNOSIS — I5040 Unspecified combined systolic (congestive) and diastolic (congestive) heart failure: Secondary | ICD-10-CM | POA: Diagnosis not present

## 2018-06-09 DIAGNOSIS — S72001D Fracture of unspecified part of neck of right femur, subsequent encounter for closed fracture with routine healing: Secondary | ICD-10-CM | POA: Diagnosis not present

## 2018-06-10 DIAGNOSIS — I5040 Unspecified combined systolic (congestive) and diastolic (congestive) heart failure: Secondary | ICD-10-CM | POA: Diagnosis not present

## 2018-06-10 DIAGNOSIS — L97521 Non-pressure chronic ulcer of other part of left foot limited to breakdown of skin: Secondary | ICD-10-CM | POA: Diagnosis not present

## 2018-06-10 DIAGNOSIS — I110 Hypertensive heart disease with heart failure: Secondary | ICD-10-CM | POA: Diagnosis not present

## 2018-06-10 DIAGNOSIS — E11621 Type 2 diabetes mellitus with foot ulcer: Secondary | ICD-10-CM | POA: Diagnosis not present

## 2018-06-10 DIAGNOSIS — S72001D Fracture of unspecified part of neck of right femur, subsequent encounter for closed fracture with routine healing: Secondary | ICD-10-CM | POA: Diagnosis not present

## 2018-06-10 DIAGNOSIS — I2510 Atherosclerotic heart disease of native coronary artery without angina pectoris: Secondary | ICD-10-CM | POA: Diagnosis not present

## 2018-06-14 DIAGNOSIS — S72001D Fracture of unspecified part of neck of right femur, subsequent encounter for closed fracture with routine healing: Secondary | ICD-10-CM | POA: Diagnosis not present

## 2018-06-14 DIAGNOSIS — I110 Hypertensive heart disease with heart failure: Secondary | ICD-10-CM | POA: Diagnosis not present

## 2018-06-14 DIAGNOSIS — L97521 Non-pressure chronic ulcer of other part of left foot limited to breakdown of skin: Secondary | ICD-10-CM | POA: Diagnosis not present

## 2018-06-14 DIAGNOSIS — I5040 Unspecified combined systolic (congestive) and diastolic (congestive) heart failure: Secondary | ICD-10-CM | POA: Diagnosis not present

## 2018-06-14 DIAGNOSIS — E11621 Type 2 diabetes mellitus with foot ulcer: Secondary | ICD-10-CM | POA: Diagnosis not present

## 2018-06-14 DIAGNOSIS — I2510 Atherosclerotic heart disease of native coronary artery without angina pectoris: Secondary | ICD-10-CM | POA: Diagnosis not present

## 2018-06-15 DIAGNOSIS — I5040 Unspecified combined systolic (congestive) and diastolic (congestive) heart failure: Secondary | ICD-10-CM | POA: Diagnosis not present

## 2018-06-15 DIAGNOSIS — I2510 Atherosclerotic heart disease of native coronary artery without angina pectoris: Secondary | ICD-10-CM | POA: Diagnosis not present

## 2018-06-15 DIAGNOSIS — I110 Hypertensive heart disease with heart failure: Secondary | ICD-10-CM | POA: Diagnosis not present

## 2018-06-15 DIAGNOSIS — S72001D Fracture of unspecified part of neck of right femur, subsequent encounter for closed fracture with routine healing: Secondary | ICD-10-CM | POA: Diagnosis not present

## 2018-06-15 DIAGNOSIS — E11621 Type 2 diabetes mellitus with foot ulcer: Secondary | ICD-10-CM | POA: Diagnosis not present

## 2018-06-15 DIAGNOSIS — L97521 Non-pressure chronic ulcer of other part of left foot limited to breakdown of skin: Secondary | ICD-10-CM | POA: Diagnosis not present

## 2018-06-16 DIAGNOSIS — I2510 Atherosclerotic heart disease of native coronary artery without angina pectoris: Secondary | ICD-10-CM | POA: Diagnosis not present

## 2018-06-16 DIAGNOSIS — I110 Hypertensive heart disease with heart failure: Secondary | ICD-10-CM | POA: Diagnosis not present

## 2018-06-16 DIAGNOSIS — E11621 Type 2 diabetes mellitus with foot ulcer: Secondary | ICD-10-CM | POA: Diagnosis not present

## 2018-06-16 DIAGNOSIS — S72001D Fracture of unspecified part of neck of right femur, subsequent encounter for closed fracture with routine healing: Secondary | ICD-10-CM | POA: Diagnosis not present

## 2018-06-16 DIAGNOSIS — I5040 Unspecified combined systolic (congestive) and diastolic (congestive) heart failure: Secondary | ICD-10-CM | POA: Diagnosis not present

## 2018-06-16 DIAGNOSIS — L97521 Non-pressure chronic ulcer of other part of left foot limited to breakdown of skin: Secondary | ICD-10-CM | POA: Diagnosis not present

## 2018-06-17 ENCOUNTER — Other Ambulatory Visit: Payer: Self-pay

## 2018-06-17 ENCOUNTER — Encounter: Payer: Self-pay | Admitting: Vascular Surgery

## 2018-06-17 ENCOUNTER — Ambulatory Visit (INDEPENDENT_AMBULATORY_CARE_PROVIDER_SITE_OTHER): Payer: Self-pay | Admitting: Vascular Surgery

## 2018-06-17 VITALS — BP 151/65 | HR 76 | Temp 97.0°F | Resp 18 | Ht 60.0 in | Wt 137.0 lb

## 2018-06-17 DIAGNOSIS — I110 Hypertensive heart disease with heart failure: Secondary | ICD-10-CM | POA: Diagnosis not present

## 2018-06-17 DIAGNOSIS — I2510 Atherosclerotic heart disease of native coronary artery without angina pectoris: Secondary | ICD-10-CM | POA: Diagnosis not present

## 2018-06-17 DIAGNOSIS — E11621 Type 2 diabetes mellitus with foot ulcer: Secondary | ICD-10-CM | POA: Diagnosis not present

## 2018-06-17 DIAGNOSIS — I739 Peripheral vascular disease, unspecified: Secondary | ICD-10-CM

## 2018-06-17 DIAGNOSIS — L97521 Non-pressure chronic ulcer of other part of left foot limited to breakdown of skin: Secondary | ICD-10-CM | POA: Diagnosis not present

## 2018-06-17 DIAGNOSIS — I5040 Unspecified combined systolic (congestive) and diastolic (congestive) heart failure: Secondary | ICD-10-CM | POA: Diagnosis not present

## 2018-06-17 DIAGNOSIS — S72001D Fracture of unspecified part of neck of right femur, subsequent encounter for closed fracture with routine healing: Secondary | ICD-10-CM | POA: Diagnosis not present

## 2018-06-17 NOTE — Progress Notes (Signed)
Patient is an 84 year old female who returns for postoperative follow-up today.  She recently underwent left common femoral endarterectomy with profundoplasty and left femoral to anterior tibial artery bypass with composite PTFE and vein on May 03, 2018.  She has had trouble healing her left groin incision.  She continues to deny any fever or chills.  She does have some occasional clear drainage from this.  She is also developed some drainage from her left mid thigh.  She is doing local wound care on the left groin.  She is ambulating some and still trying to recover physical strength from prior to her operation.  Physical exam:  Vitals:   06/17/18 0934  BP: (!) 151/65  Pulse: 76  Resp: 18  Temp: (!) 97 F (36.1 C)  TempSrc: Oral  SpO2: 100%  Weight: 137 lb (62.1 kg)  Height: 5' (1.524 m)    Extremities: Left groin wound 1 x 1 x 1 cm opening left groin with 100% granulation tissue no exposed graft, left mid thigh with a raised area and some drainage.  I opened this with a Q-tip today and expressed out about 5 cc of bloody purulent material.  Both of these were packed with normal saline moistened Nu Gauze.  She did have some macular rash around her left groin consistent with most likely yeast from a wet dressing in the groin.  I discussed with her daughter today about how to reduce the moisture in this area.  This could potentially also be related to tape.  She has a 2+ graft pulse on the lateral aspect of her left leg.  The metatarsal head ulcer on her left first toe medially is slowly drying out and healing.  Assessment: Patent left lower extremity bypass.  Continued slow healing left groin wound and now left mid thigh saphenectomy site wound.  Plan: Continue local wound care for both of these wounds.  Patient will follow-up in 3 to 5 weeks for recheck.  Ruta Hinds, MD [facility] [place] Office: [phone] Pager: [phone]

## 2018-06-18 DIAGNOSIS — I5040 Unspecified combined systolic (congestive) and diastolic (congestive) heart failure: Secondary | ICD-10-CM | POA: Diagnosis not present

## 2018-06-18 DIAGNOSIS — E11621 Type 2 diabetes mellitus with foot ulcer: Secondary | ICD-10-CM | POA: Diagnosis not present

## 2018-06-18 DIAGNOSIS — S72001D Fracture of unspecified part of neck of right femur, subsequent encounter for closed fracture with routine healing: Secondary | ICD-10-CM | POA: Diagnosis not present

## 2018-06-18 DIAGNOSIS — I2510 Atherosclerotic heart disease of native coronary artery without angina pectoris: Secondary | ICD-10-CM | POA: Diagnosis not present

## 2018-06-18 DIAGNOSIS — I110 Hypertensive heart disease with heart failure: Secondary | ICD-10-CM | POA: Diagnosis not present

## 2018-06-18 DIAGNOSIS — L97521 Non-pressure chronic ulcer of other part of left foot limited to breakdown of skin: Secondary | ICD-10-CM | POA: Diagnosis not present

## 2018-06-21 DIAGNOSIS — I5040 Unspecified combined systolic (congestive) and diastolic (congestive) heart failure: Secondary | ICD-10-CM | POA: Diagnosis not present

## 2018-06-21 DIAGNOSIS — S72001D Fracture of unspecified part of neck of right femur, subsequent encounter for closed fracture with routine healing: Secondary | ICD-10-CM | POA: Diagnosis not present

## 2018-06-21 DIAGNOSIS — I2510 Atherosclerotic heart disease of native coronary artery without angina pectoris: Secondary | ICD-10-CM | POA: Diagnosis not present

## 2018-06-21 DIAGNOSIS — I110 Hypertensive heart disease with heart failure: Secondary | ICD-10-CM | POA: Diagnosis not present

## 2018-06-21 DIAGNOSIS — L97521 Non-pressure chronic ulcer of other part of left foot limited to breakdown of skin: Secondary | ICD-10-CM | POA: Diagnosis not present

## 2018-06-21 DIAGNOSIS — E11621 Type 2 diabetes mellitus with foot ulcer: Secondary | ICD-10-CM | POA: Diagnosis not present

## 2018-06-22 DIAGNOSIS — L97521 Non-pressure chronic ulcer of other part of left foot limited to breakdown of skin: Secondary | ICD-10-CM | POA: Diagnosis not present

## 2018-06-22 DIAGNOSIS — I5040 Unspecified combined systolic (congestive) and diastolic (congestive) heart failure: Secondary | ICD-10-CM | POA: Diagnosis not present

## 2018-06-22 DIAGNOSIS — E11621 Type 2 diabetes mellitus with foot ulcer: Secondary | ICD-10-CM | POA: Diagnosis not present

## 2018-06-22 DIAGNOSIS — I110 Hypertensive heart disease with heart failure: Secondary | ICD-10-CM | POA: Diagnosis not present

## 2018-06-22 DIAGNOSIS — I2510 Atherosclerotic heart disease of native coronary artery without angina pectoris: Secondary | ICD-10-CM | POA: Diagnosis not present

## 2018-06-22 DIAGNOSIS — S72001D Fracture of unspecified part of neck of right femur, subsequent encounter for closed fracture with routine healing: Secondary | ICD-10-CM | POA: Diagnosis not present

## 2018-06-23 DIAGNOSIS — S72001D Fracture of unspecified part of neck of right femur, subsequent encounter for closed fracture with routine healing: Secondary | ICD-10-CM | POA: Diagnosis not present

## 2018-06-23 DIAGNOSIS — E11621 Type 2 diabetes mellitus with foot ulcer: Secondary | ICD-10-CM | POA: Diagnosis not present

## 2018-06-23 DIAGNOSIS — I2510 Atherosclerotic heart disease of native coronary artery without angina pectoris: Secondary | ICD-10-CM | POA: Diagnosis not present

## 2018-06-23 DIAGNOSIS — I5040 Unspecified combined systolic (congestive) and diastolic (congestive) heart failure: Secondary | ICD-10-CM | POA: Diagnosis not present

## 2018-06-23 DIAGNOSIS — L97521 Non-pressure chronic ulcer of other part of left foot limited to breakdown of skin: Secondary | ICD-10-CM | POA: Diagnosis not present

## 2018-06-23 DIAGNOSIS — I110 Hypertensive heart disease with heart failure: Secondary | ICD-10-CM | POA: Diagnosis not present

## 2018-06-25 DIAGNOSIS — J449 Chronic obstructive pulmonary disease, unspecified: Secondary | ICD-10-CM | POA: Diagnosis not present

## 2018-06-25 DIAGNOSIS — Z87891 Personal history of nicotine dependence: Secondary | ICD-10-CM | POA: Diagnosis not present

## 2018-06-25 DIAGNOSIS — E11621 Type 2 diabetes mellitus with foot ulcer: Secondary | ICD-10-CM | POA: Diagnosis not present

## 2018-06-25 DIAGNOSIS — E1122 Type 2 diabetes mellitus with diabetic chronic kidney disease: Secondary | ICD-10-CM | POA: Diagnosis not present

## 2018-06-25 DIAGNOSIS — N189 Chronic kidney disease, unspecified: Secondary | ICD-10-CM | POA: Diagnosis not present

## 2018-06-25 DIAGNOSIS — E1151 Type 2 diabetes mellitus with diabetic peripheral angiopathy without gangrene: Secondary | ICD-10-CM | POA: Diagnosis not present

## 2018-06-25 DIAGNOSIS — T8141XD Infection following a procedure, superficial incisional surgical site, subsequent encounter: Secondary | ICD-10-CM | POA: Diagnosis not present

## 2018-06-25 DIAGNOSIS — D649 Anemia, unspecified: Secondary | ICD-10-CM | POA: Diagnosis not present

## 2018-06-25 DIAGNOSIS — I2510 Atherosclerotic heart disease of native coronary artery without angina pectoris: Secondary | ICD-10-CM | POA: Diagnosis not present

## 2018-06-25 DIAGNOSIS — I130 Hypertensive heart and chronic kidney disease with heart failure and stage 1 through stage 4 chronic kidney disease, or unspecified chronic kidney disease: Secondary | ICD-10-CM | POA: Diagnosis not present

## 2018-06-25 DIAGNOSIS — E1165 Type 2 diabetes mellitus with hyperglycemia: Secondary | ICD-10-CM | POA: Diagnosis not present

## 2018-06-25 DIAGNOSIS — Z7984 Long term (current) use of oral hypoglycemic drugs: Secondary | ICD-10-CM | POA: Diagnosis not present

## 2018-06-25 DIAGNOSIS — I5040 Unspecified combined systolic (congestive) and diastolic (congestive) heart failure: Secondary | ICD-10-CM | POA: Diagnosis not present

## 2018-06-25 DIAGNOSIS — E785 Hyperlipidemia, unspecified: Secondary | ICD-10-CM | POA: Diagnosis not present

## 2018-06-25 DIAGNOSIS — Z96641 Presence of right artificial hip joint: Secondary | ICD-10-CM | POA: Diagnosis not present

## 2018-06-25 DIAGNOSIS — L97521 Non-pressure chronic ulcer of other part of left foot limited to breakdown of skin: Secondary | ICD-10-CM | POA: Diagnosis not present

## 2018-06-28 DIAGNOSIS — E1151 Type 2 diabetes mellitus with diabetic peripheral angiopathy without gangrene: Secondary | ICD-10-CM | POA: Diagnosis not present

## 2018-06-28 DIAGNOSIS — E11621 Type 2 diabetes mellitus with foot ulcer: Secondary | ICD-10-CM | POA: Diagnosis not present

## 2018-06-28 DIAGNOSIS — L97521 Non-pressure chronic ulcer of other part of left foot limited to breakdown of skin: Secondary | ICD-10-CM | POA: Diagnosis not present

## 2018-06-28 DIAGNOSIS — E1165 Type 2 diabetes mellitus with hyperglycemia: Secondary | ICD-10-CM | POA: Diagnosis not present

## 2018-06-28 DIAGNOSIS — I2510 Atherosclerotic heart disease of native coronary artery without angina pectoris: Secondary | ICD-10-CM | POA: Diagnosis not present

## 2018-06-28 DIAGNOSIS — T8141XD Infection following a procedure, superficial incisional surgical site, subsequent encounter: Secondary | ICD-10-CM | POA: Diagnosis not present

## 2018-06-29 DIAGNOSIS — E11621 Type 2 diabetes mellitus with foot ulcer: Secondary | ICD-10-CM | POA: Diagnosis not present

## 2018-06-29 DIAGNOSIS — L97521 Non-pressure chronic ulcer of other part of left foot limited to breakdown of skin: Secondary | ICD-10-CM | POA: Diagnosis not present

## 2018-06-29 DIAGNOSIS — I2510 Atherosclerotic heart disease of native coronary artery without angina pectoris: Secondary | ICD-10-CM | POA: Diagnosis not present

## 2018-06-29 DIAGNOSIS — E1165 Type 2 diabetes mellitus with hyperglycemia: Secondary | ICD-10-CM | POA: Diagnosis not present

## 2018-06-29 DIAGNOSIS — T8141XD Infection following a procedure, superficial incisional surgical site, subsequent encounter: Secondary | ICD-10-CM | POA: Diagnosis not present

## 2018-06-29 DIAGNOSIS — E1151 Type 2 diabetes mellitus with diabetic peripheral angiopathy without gangrene: Secondary | ICD-10-CM | POA: Diagnosis not present

## 2018-07-01 DIAGNOSIS — E1151 Type 2 diabetes mellitus with diabetic peripheral angiopathy without gangrene: Secondary | ICD-10-CM | POA: Diagnosis not present

## 2018-07-01 DIAGNOSIS — T8141XD Infection following a procedure, superficial incisional surgical site, subsequent encounter: Secondary | ICD-10-CM | POA: Diagnosis not present

## 2018-07-01 DIAGNOSIS — E1165 Type 2 diabetes mellitus with hyperglycemia: Secondary | ICD-10-CM | POA: Diagnosis not present

## 2018-07-01 DIAGNOSIS — I2510 Atherosclerotic heart disease of native coronary artery without angina pectoris: Secondary | ICD-10-CM | POA: Diagnosis not present

## 2018-07-01 DIAGNOSIS — E11621 Type 2 diabetes mellitus with foot ulcer: Secondary | ICD-10-CM | POA: Diagnosis not present

## 2018-07-01 DIAGNOSIS — L97521 Non-pressure chronic ulcer of other part of left foot limited to breakdown of skin: Secondary | ICD-10-CM | POA: Diagnosis not present

## 2018-07-02 DIAGNOSIS — E1165 Type 2 diabetes mellitus with hyperglycemia: Secondary | ICD-10-CM | POA: Diagnosis not present

## 2018-07-02 DIAGNOSIS — E11621 Type 2 diabetes mellitus with foot ulcer: Secondary | ICD-10-CM | POA: Diagnosis not present

## 2018-07-02 DIAGNOSIS — L97521 Non-pressure chronic ulcer of other part of left foot limited to breakdown of skin: Secondary | ICD-10-CM | POA: Diagnosis not present

## 2018-07-02 DIAGNOSIS — T8141XD Infection following a procedure, superficial incisional surgical site, subsequent encounter: Secondary | ICD-10-CM | POA: Diagnosis not present

## 2018-07-02 DIAGNOSIS — I2510 Atherosclerotic heart disease of native coronary artery without angina pectoris: Secondary | ICD-10-CM | POA: Diagnosis not present

## 2018-07-02 DIAGNOSIS — E1151 Type 2 diabetes mellitus with diabetic peripheral angiopathy without gangrene: Secondary | ICD-10-CM | POA: Diagnosis not present

## 2018-07-05 DIAGNOSIS — E1151 Type 2 diabetes mellitus with diabetic peripheral angiopathy without gangrene: Secondary | ICD-10-CM | POA: Diagnosis not present

## 2018-07-05 DIAGNOSIS — L97521 Non-pressure chronic ulcer of other part of left foot limited to breakdown of skin: Secondary | ICD-10-CM | POA: Diagnosis not present

## 2018-07-05 DIAGNOSIS — E11621 Type 2 diabetes mellitus with foot ulcer: Secondary | ICD-10-CM | POA: Diagnosis not present

## 2018-07-05 DIAGNOSIS — E1165 Type 2 diabetes mellitus with hyperglycemia: Secondary | ICD-10-CM | POA: Diagnosis not present

## 2018-07-05 DIAGNOSIS — I2510 Atherosclerotic heart disease of native coronary artery without angina pectoris: Secondary | ICD-10-CM | POA: Diagnosis not present

## 2018-07-05 DIAGNOSIS — T8141XD Infection following a procedure, superficial incisional surgical site, subsequent encounter: Secondary | ICD-10-CM | POA: Diagnosis not present

## 2018-07-06 DIAGNOSIS — I2510 Atherosclerotic heart disease of native coronary artery without angina pectoris: Secondary | ICD-10-CM | POA: Diagnosis not present

## 2018-07-06 DIAGNOSIS — E11621 Type 2 diabetes mellitus with foot ulcer: Secondary | ICD-10-CM | POA: Diagnosis not present

## 2018-07-06 DIAGNOSIS — E1151 Type 2 diabetes mellitus with diabetic peripheral angiopathy without gangrene: Secondary | ICD-10-CM | POA: Diagnosis not present

## 2018-07-06 DIAGNOSIS — T8141XD Infection following a procedure, superficial incisional surgical site, subsequent encounter: Secondary | ICD-10-CM | POA: Diagnosis not present

## 2018-07-06 DIAGNOSIS — L97521 Non-pressure chronic ulcer of other part of left foot limited to breakdown of skin: Secondary | ICD-10-CM | POA: Diagnosis not present

## 2018-07-06 DIAGNOSIS — E1165 Type 2 diabetes mellitus with hyperglycemia: Secondary | ICD-10-CM | POA: Diagnosis not present

## 2018-07-08 DIAGNOSIS — I2510 Atherosclerotic heart disease of native coronary artery without angina pectoris: Secondary | ICD-10-CM | POA: Diagnosis not present

## 2018-07-08 DIAGNOSIS — T8141XD Infection following a procedure, superficial incisional surgical site, subsequent encounter: Secondary | ICD-10-CM | POA: Diagnosis not present

## 2018-07-08 DIAGNOSIS — L97521 Non-pressure chronic ulcer of other part of left foot limited to breakdown of skin: Secondary | ICD-10-CM | POA: Diagnosis not present

## 2018-07-08 DIAGNOSIS — E1151 Type 2 diabetes mellitus with diabetic peripheral angiopathy without gangrene: Secondary | ICD-10-CM | POA: Diagnosis not present

## 2018-07-08 DIAGNOSIS — E11621 Type 2 diabetes mellitus with foot ulcer: Secondary | ICD-10-CM | POA: Diagnosis not present

## 2018-07-08 DIAGNOSIS — E1165 Type 2 diabetes mellitus with hyperglycemia: Secondary | ICD-10-CM | POA: Diagnosis not present

## 2018-07-09 DIAGNOSIS — T8141XD Infection following a procedure, superficial incisional surgical site, subsequent encounter: Secondary | ICD-10-CM | POA: Diagnosis not present

## 2018-07-09 DIAGNOSIS — E11621 Type 2 diabetes mellitus with foot ulcer: Secondary | ICD-10-CM | POA: Diagnosis not present

## 2018-07-09 DIAGNOSIS — L97521 Non-pressure chronic ulcer of other part of left foot limited to breakdown of skin: Secondary | ICD-10-CM | POA: Diagnosis not present

## 2018-07-09 DIAGNOSIS — E1165 Type 2 diabetes mellitus with hyperglycemia: Secondary | ICD-10-CM | POA: Diagnosis not present

## 2018-07-09 DIAGNOSIS — E1151 Type 2 diabetes mellitus with diabetic peripheral angiopathy without gangrene: Secondary | ICD-10-CM | POA: Diagnosis not present

## 2018-07-09 DIAGNOSIS — I2510 Atherosclerotic heart disease of native coronary artery without angina pectoris: Secondary | ICD-10-CM | POA: Diagnosis not present

## 2018-07-12 DIAGNOSIS — L97521 Non-pressure chronic ulcer of other part of left foot limited to breakdown of skin: Secondary | ICD-10-CM | POA: Diagnosis not present

## 2018-07-12 DIAGNOSIS — E11621 Type 2 diabetes mellitus with foot ulcer: Secondary | ICD-10-CM | POA: Diagnosis not present

## 2018-07-12 DIAGNOSIS — I2510 Atherosclerotic heart disease of native coronary artery without angina pectoris: Secondary | ICD-10-CM | POA: Diagnosis not present

## 2018-07-12 DIAGNOSIS — E1165 Type 2 diabetes mellitus with hyperglycemia: Secondary | ICD-10-CM | POA: Diagnosis not present

## 2018-07-12 DIAGNOSIS — E1151 Type 2 diabetes mellitus with diabetic peripheral angiopathy without gangrene: Secondary | ICD-10-CM | POA: Diagnosis not present

## 2018-07-12 DIAGNOSIS — T8141XD Infection following a procedure, superficial incisional surgical site, subsequent encounter: Secondary | ICD-10-CM | POA: Diagnosis not present

## 2018-07-13 DIAGNOSIS — E11621 Type 2 diabetes mellitus with foot ulcer: Secondary | ICD-10-CM | POA: Diagnosis not present

## 2018-07-13 DIAGNOSIS — H353113 Nonexudative age-related macular degeneration, right eye, advanced atrophic without subfoveal involvement: Secondary | ICD-10-CM | POA: Diagnosis not present

## 2018-07-13 DIAGNOSIS — T8141XD Infection following a procedure, superficial incisional surgical site, subsequent encounter: Secondary | ICD-10-CM | POA: Diagnosis not present

## 2018-07-13 DIAGNOSIS — E113493 Type 2 diabetes mellitus with severe nonproliferative diabetic retinopathy without macular edema, bilateral: Secondary | ICD-10-CM | POA: Diagnosis not present

## 2018-07-13 DIAGNOSIS — E1165 Type 2 diabetes mellitus with hyperglycemia: Secondary | ICD-10-CM | POA: Diagnosis not present

## 2018-07-13 DIAGNOSIS — L97521 Non-pressure chronic ulcer of other part of left foot limited to breakdown of skin: Secondary | ICD-10-CM | POA: Diagnosis not present

## 2018-07-13 DIAGNOSIS — E1151 Type 2 diabetes mellitus with diabetic peripheral angiopathy without gangrene: Secondary | ICD-10-CM | POA: Diagnosis not present

## 2018-07-13 DIAGNOSIS — E113491 Type 2 diabetes mellitus with severe nonproliferative diabetic retinopathy without macular edema, right eye: Secondary | ICD-10-CM | POA: Diagnosis not present

## 2018-07-13 DIAGNOSIS — H353124 Nonexudative age-related macular degeneration, left eye, advanced atrophic with subfoveal involvement: Secondary | ICD-10-CM | POA: Diagnosis not present

## 2018-07-13 DIAGNOSIS — I2510 Atherosclerotic heart disease of native coronary artery without angina pectoris: Secondary | ICD-10-CM | POA: Diagnosis not present

## 2018-07-15 ENCOUNTER — Encounter: Payer: Self-pay | Admitting: Vascular Surgery

## 2018-07-15 ENCOUNTER — Ambulatory Visit: Payer: 59 | Admitting: Vascular Surgery

## 2018-07-15 ENCOUNTER — Other Ambulatory Visit: Payer: Self-pay

## 2018-07-15 ENCOUNTER — Ambulatory Visit (INDEPENDENT_AMBULATORY_CARE_PROVIDER_SITE_OTHER): Payer: Self-pay | Admitting: Vascular Surgery

## 2018-07-15 VITALS — BP 136/65 | HR 72 | Resp 16 | Ht 60.0 in | Wt 137.0 lb

## 2018-07-15 DIAGNOSIS — I739 Peripheral vascular disease, unspecified: Secondary | ICD-10-CM

## 2018-07-15 NOTE — Progress Notes (Signed)
Patient is a 84 year old female who underwent left common femoral endarterectomy with profundoplasty and left femoral to anterior tibial artery bypass with composite PTFE and vein May 03, 2018.  She has had trouble healing her wounds.  She has no fever or chills.  She does still have some drainage from a medial above-knee incision.  The groin is now healed.  He is still overall fairly deconditioned and has lower extremity edema.  Bypass was originally done for a nonhealing wound of her left first metatarsal head and this is continuing to slowly heal.  Physical exam:  Vitals:   07/15/18 1230  BP: 136/65  Pulse: 72  Resp: 16  SpO2: 95%  Weight: 137 lb (62.1 kg)  Height: 5' (1.524 m)   Left groin completely healed  Left medial thigh just above the knee 1 x 1 cm x 3 cm depth wound with some clear drainage healthy-appearing granulation tissue  2+ lateral graft pulse left leg  Left first metatarsal ulcer with dry eschar 1 x 1 cm diameter  Assessment: Slowly healing wounds left leg status post anterior tibial artery bypass overall deconditioned but ambulating some  Plan: The patient will follow-up for repeat wound check in 4 to 6 weeks.  Ruta Hinds, MD [facility] [place] Office: [phone] Pager: [phone]

## 2018-07-16 DIAGNOSIS — T8141XD Infection following a procedure, superficial incisional surgical site, subsequent encounter: Secondary | ICD-10-CM | POA: Diagnosis not present

## 2018-07-16 DIAGNOSIS — E11621 Type 2 diabetes mellitus with foot ulcer: Secondary | ICD-10-CM | POA: Diagnosis not present

## 2018-07-16 DIAGNOSIS — E1151 Type 2 diabetes mellitus with diabetic peripheral angiopathy without gangrene: Secondary | ICD-10-CM | POA: Diagnosis not present

## 2018-07-16 DIAGNOSIS — L97521 Non-pressure chronic ulcer of other part of left foot limited to breakdown of skin: Secondary | ICD-10-CM | POA: Diagnosis not present

## 2018-07-16 DIAGNOSIS — I2510 Atherosclerotic heart disease of native coronary artery without angina pectoris: Secondary | ICD-10-CM | POA: Diagnosis not present

## 2018-07-16 DIAGNOSIS — E1165 Type 2 diabetes mellitus with hyperglycemia: Secondary | ICD-10-CM | POA: Diagnosis not present

## 2018-07-19 DIAGNOSIS — E1165 Type 2 diabetes mellitus with hyperglycemia: Secondary | ICD-10-CM | POA: Diagnosis not present

## 2018-07-19 DIAGNOSIS — L97521 Non-pressure chronic ulcer of other part of left foot limited to breakdown of skin: Secondary | ICD-10-CM | POA: Diagnosis not present

## 2018-07-19 DIAGNOSIS — E1151 Type 2 diabetes mellitus with diabetic peripheral angiopathy without gangrene: Secondary | ICD-10-CM | POA: Diagnosis not present

## 2018-07-19 DIAGNOSIS — T8141XD Infection following a procedure, superficial incisional surgical site, subsequent encounter: Secondary | ICD-10-CM | POA: Diagnosis not present

## 2018-07-19 DIAGNOSIS — I2510 Atherosclerotic heart disease of native coronary artery without angina pectoris: Secondary | ICD-10-CM | POA: Diagnosis not present

## 2018-07-19 DIAGNOSIS — E11621 Type 2 diabetes mellitus with foot ulcer: Secondary | ICD-10-CM | POA: Diagnosis not present

## 2018-07-20 DIAGNOSIS — E1151 Type 2 diabetes mellitus with diabetic peripheral angiopathy without gangrene: Secondary | ICD-10-CM | POA: Diagnosis not present

## 2018-07-20 DIAGNOSIS — I2510 Atherosclerotic heart disease of native coronary artery without angina pectoris: Secondary | ICD-10-CM | POA: Diagnosis not present

## 2018-07-20 DIAGNOSIS — E11621 Type 2 diabetes mellitus with foot ulcer: Secondary | ICD-10-CM | POA: Diagnosis not present

## 2018-07-20 DIAGNOSIS — T8141XD Infection following a procedure, superficial incisional surgical site, subsequent encounter: Secondary | ICD-10-CM | POA: Diagnosis not present

## 2018-07-20 DIAGNOSIS — E1165 Type 2 diabetes mellitus with hyperglycemia: Secondary | ICD-10-CM | POA: Diagnosis not present

## 2018-07-20 DIAGNOSIS — L97521 Non-pressure chronic ulcer of other part of left foot limited to breakdown of skin: Secondary | ICD-10-CM | POA: Diagnosis not present

## 2018-07-22 DIAGNOSIS — L97521 Non-pressure chronic ulcer of other part of left foot limited to breakdown of skin: Secondary | ICD-10-CM | POA: Diagnosis not present

## 2018-07-22 DIAGNOSIS — T8141XD Infection following a procedure, superficial incisional surgical site, subsequent encounter: Secondary | ICD-10-CM | POA: Diagnosis not present

## 2018-07-22 DIAGNOSIS — E1151 Type 2 diabetes mellitus with diabetic peripheral angiopathy without gangrene: Secondary | ICD-10-CM | POA: Diagnosis not present

## 2018-07-22 DIAGNOSIS — I2510 Atherosclerotic heart disease of native coronary artery without angina pectoris: Secondary | ICD-10-CM | POA: Diagnosis not present

## 2018-07-22 DIAGNOSIS — E11621 Type 2 diabetes mellitus with foot ulcer: Secondary | ICD-10-CM | POA: Diagnosis not present

## 2018-07-22 DIAGNOSIS — E1165 Type 2 diabetes mellitus with hyperglycemia: Secondary | ICD-10-CM | POA: Diagnosis not present

## 2018-07-24 DIAGNOSIS — L97521 Non-pressure chronic ulcer of other part of left foot limited to breakdown of skin: Secondary | ICD-10-CM | POA: Diagnosis not present

## 2018-07-24 DIAGNOSIS — T8141XD Infection following a procedure, superficial incisional surgical site, subsequent encounter: Secondary | ICD-10-CM | POA: Diagnosis not present

## 2018-07-24 DIAGNOSIS — E1165 Type 2 diabetes mellitus with hyperglycemia: Secondary | ICD-10-CM | POA: Diagnosis not present

## 2018-07-24 DIAGNOSIS — E1151 Type 2 diabetes mellitus with diabetic peripheral angiopathy without gangrene: Secondary | ICD-10-CM | POA: Diagnosis not present

## 2018-07-24 DIAGNOSIS — E11621 Type 2 diabetes mellitus with foot ulcer: Secondary | ICD-10-CM | POA: Diagnosis not present

## 2018-07-24 DIAGNOSIS — I2510 Atherosclerotic heart disease of native coronary artery without angina pectoris: Secondary | ICD-10-CM | POA: Diagnosis not present

## 2018-07-26 DIAGNOSIS — E1165 Type 2 diabetes mellitus with hyperglycemia: Secondary | ICD-10-CM | POA: Diagnosis not present

## 2018-07-26 DIAGNOSIS — E11621 Type 2 diabetes mellitus with foot ulcer: Secondary | ICD-10-CM | POA: Diagnosis not present

## 2018-07-26 DIAGNOSIS — I2510 Atherosclerotic heart disease of native coronary artery without angina pectoris: Secondary | ICD-10-CM | POA: Diagnosis not present

## 2018-07-26 DIAGNOSIS — E1151 Type 2 diabetes mellitus with diabetic peripheral angiopathy without gangrene: Secondary | ICD-10-CM | POA: Diagnosis not present

## 2018-07-26 DIAGNOSIS — T8141XD Infection following a procedure, superficial incisional surgical site, subsequent encounter: Secondary | ICD-10-CM | POA: Diagnosis not present

## 2018-07-26 DIAGNOSIS — L97521 Non-pressure chronic ulcer of other part of left foot limited to breakdown of skin: Secondary | ICD-10-CM | POA: Diagnosis not present

## 2018-07-27 DIAGNOSIS — E1165 Type 2 diabetes mellitus with hyperglycemia: Secondary | ICD-10-CM | POA: Diagnosis not present

## 2018-07-27 DIAGNOSIS — T8141XD Infection following a procedure, superficial incisional surgical site, subsequent encounter: Secondary | ICD-10-CM | POA: Diagnosis not present

## 2018-07-27 DIAGNOSIS — I2510 Atherosclerotic heart disease of native coronary artery without angina pectoris: Secondary | ICD-10-CM | POA: Diagnosis not present

## 2018-07-27 DIAGNOSIS — E11621 Type 2 diabetes mellitus with foot ulcer: Secondary | ICD-10-CM | POA: Diagnosis not present

## 2018-07-27 DIAGNOSIS — E1151 Type 2 diabetes mellitus with diabetic peripheral angiopathy without gangrene: Secondary | ICD-10-CM | POA: Diagnosis not present

## 2018-07-27 DIAGNOSIS — L97521 Non-pressure chronic ulcer of other part of left foot limited to breakdown of skin: Secondary | ICD-10-CM | POA: Diagnosis not present

## 2018-07-29 DIAGNOSIS — E11621 Type 2 diabetes mellitus with foot ulcer: Secondary | ICD-10-CM | POA: Diagnosis not present

## 2018-07-29 DIAGNOSIS — I2510 Atherosclerotic heart disease of native coronary artery without angina pectoris: Secondary | ICD-10-CM | POA: Diagnosis not present

## 2018-07-29 DIAGNOSIS — E1165 Type 2 diabetes mellitus with hyperglycemia: Secondary | ICD-10-CM | POA: Diagnosis not present

## 2018-07-29 DIAGNOSIS — T8141XD Infection following a procedure, superficial incisional surgical site, subsequent encounter: Secondary | ICD-10-CM | POA: Diagnosis not present

## 2018-07-29 DIAGNOSIS — E1151 Type 2 diabetes mellitus with diabetic peripheral angiopathy without gangrene: Secondary | ICD-10-CM | POA: Diagnosis not present

## 2018-07-29 DIAGNOSIS — L97521 Non-pressure chronic ulcer of other part of left foot limited to breakdown of skin: Secondary | ICD-10-CM | POA: Diagnosis not present

## 2018-08-02 DIAGNOSIS — L97521 Non-pressure chronic ulcer of other part of left foot limited to breakdown of skin: Secondary | ICD-10-CM | POA: Diagnosis not present

## 2018-08-02 DIAGNOSIS — I2510 Atherosclerotic heart disease of native coronary artery without angina pectoris: Secondary | ICD-10-CM | POA: Diagnosis not present

## 2018-08-02 DIAGNOSIS — E1165 Type 2 diabetes mellitus with hyperglycemia: Secondary | ICD-10-CM | POA: Diagnosis not present

## 2018-08-02 DIAGNOSIS — T8141XD Infection following a procedure, superficial incisional surgical site, subsequent encounter: Secondary | ICD-10-CM | POA: Diagnosis not present

## 2018-08-02 DIAGNOSIS — E11621 Type 2 diabetes mellitus with foot ulcer: Secondary | ICD-10-CM | POA: Diagnosis not present

## 2018-08-02 DIAGNOSIS — E1151 Type 2 diabetes mellitus with diabetic peripheral angiopathy without gangrene: Secondary | ICD-10-CM | POA: Diagnosis not present

## 2018-08-03 DIAGNOSIS — I2510 Atherosclerotic heart disease of native coronary artery without angina pectoris: Secondary | ICD-10-CM | POA: Diagnosis not present

## 2018-08-03 DIAGNOSIS — T8141XD Infection following a procedure, superficial incisional surgical site, subsequent encounter: Secondary | ICD-10-CM | POA: Diagnosis not present

## 2018-08-03 DIAGNOSIS — E11621 Type 2 diabetes mellitus with foot ulcer: Secondary | ICD-10-CM | POA: Diagnosis not present

## 2018-08-03 DIAGNOSIS — L97521 Non-pressure chronic ulcer of other part of left foot limited to breakdown of skin: Secondary | ICD-10-CM | POA: Diagnosis not present

## 2018-08-03 DIAGNOSIS — E1151 Type 2 diabetes mellitus with diabetic peripheral angiopathy without gangrene: Secondary | ICD-10-CM | POA: Diagnosis not present

## 2018-08-03 DIAGNOSIS — E1165 Type 2 diabetes mellitus with hyperglycemia: Secondary | ICD-10-CM | POA: Diagnosis not present

## 2018-08-04 DIAGNOSIS — L97521 Non-pressure chronic ulcer of other part of left foot limited to breakdown of skin: Secondary | ICD-10-CM | POA: Diagnosis not present

## 2018-08-04 DIAGNOSIS — E1165 Type 2 diabetes mellitus with hyperglycemia: Secondary | ICD-10-CM | POA: Diagnosis not present

## 2018-08-04 DIAGNOSIS — T8141XD Infection following a procedure, superficial incisional surgical site, subsequent encounter: Secondary | ICD-10-CM | POA: Diagnosis not present

## 2018-08-04 DIAGNOSIS — I2510 Atherosclerotic heart disease of native coronary artery without angina pectoris: Secondary | ICD-10-CM | POA: Diagnosis not present

## 2018-08-04 DIAGNOSIS — E1151 Type 2 diabetes mellitus with diabetic peripheral angiopathy without gangrene: Secondary | ICD-10-CM | POA: Diagnosis not present

## 2018-08-04 DIAGNOSIS — E11621 Type 2 diabetes mellitus with foot ulcer: Secondary | ICD-10-CM | POA: Diagnosis not present

## 2018-08-05 DIAGNOSIS — T8141XD Infection following a procedure, superficial incisional surgical site, subsequent encounter: Secondary | ICD-10-CM | POA: Diagnosis not present

## 2018-08-05 DIAGNOSIS — I2510 Atherosclerotic heart disease of native coronary artery without angina pectoris: Secondary | ICD-10-CM | POA: Diagnosis not present

## 2018-08-05 DIAGNOSIS — E11621 Type 2 diabetes mellitus with foot ulcer: Secondary | ICD-10-CM | POA: Diagnosis not present

## 2018-08-05 DIAGNOSIS — E1165 Type 2 diabetes mellitus with hyperglycemia: Secondary | ICD-10-CM | POA: Diagnosis not present

## 2018-08-05 DIAGNOSIS — E1151 Type 2 diabetes mellitus with diabetic peripheral angiopathy without gangrene: Secondary | ICD-10-CM | POA: Diagnosis not present

## 2018-08-05 DIAGNOSIS — L97521 Non-pressure chronic ulcer of other part of left foot limited to breakdown of skin: Secondary | ICD-10-CM | POA: Diagnosis not present

## 2018-08-06 DIAGNOSIS — E11621 Type 2 diabetes mellitus with foot ulcer: Secondary | ICD-10-CM | POA: Diagnosis not present

## 2018-08-06 DIAGNOSIS — I2510 Atherosclerotic heart disease of native coronary artery without angina pectoris: Secondary | ICD-10-CM | POA: Diagnosis not present

## 2018-08-06 DIAGNOSIS — E1151 Type 2 diabetes mellitus with diabetic peripheral angiopathy without gangrene: Secondary | ICD-10-CM | POA: Diagnosis not present

## 2018-08-06 DIAGNOSIS — T8141XD Infection following a procedure, superficial incisional surgical site, subsequent encounter: Secondary | ICD-10-CM | POA: Diagnosis not present

## 2018-08-06 DIAGNOSIS — E1165 Type 2 diabetes mellitus with hyperglycemia: Secondary | ICD-10-CM | POA: Diagnosis not present

## 2018-08-06 DIAGNOSIS — L97521 Non-pressure chronic ulcer of other part of left foot limited to breakdown of skin: Secondary | ICD-10-CM | POA: Diagnosis not present

## 2018-08-09 DIAGNOSIS — E1165 Type 2 diabetes mellitus with hyperglycemia: Secondary | ICD-10-CM | POA: Diagnosis not present

## 2018-08-09 DIAGNOSIS — L97521 Non-pressure chronic ulcer of other part of left foot limited to breakdown of skin: Secondary | ICD-10-CM | POA: Diagnosis not present

## 2018-08-09 DIAGNOSIS — E11621 Type 2 diabetes mellitus with foot ulcer: Secondary | ICD-10-CM | POA: Diagnosis not present

## 2018-08-09 DIAGNOSIS — I2510 Atherosclerotic heart disease of native coronary artery without angina pectoris: Secondary | ICD-10-CM | POA: Diagnosis not present

## 2018-08-09 DIAGNOSIS — E1151 Type 2 diabetes mellitus with diabetic peripheral angiopathy without gangrene: Secondary | ICD-10-CM | POA: Diagnosis not present

## 2018-08-09 DIAGNOSIS — T8141XD Infection following a procedure, superficial incisional surgical site, subsequent encounter: Secondary | ICD-10-CM | POA: Diagnosis not present

## 2018-08-11 DIAGNOSIS — E1151 Type 2 diabetes mellitus with diabetic peripheral angiopathy without gangrene: Secondary | ICD-10-CM | POA: Diagnosis not present

## 2018-08-11 DIAGNOSIS — L97521 Non-pressure chronic ulcer of other part of left foot limited to breakdown of skin: Secondary | ICD-10-CM | POA: Diagnosis not present

## 2018-08-11 DIAGNOSIS — I2510 Atherosclerotic heart disease of native coronary artery without angina pectoris: Secondary | ICD-10-CM | POA: Diagnosis not present

## 2018-08-11 DIAGNOSIS — E1165 Type 2 diabetes mellitus with hyperglycemia: Secondary | ICD-10-CM | POA: Diagnosis not present

## 2018-08-11 DIAGNOSIS — E11621 Type 2 diabetes mellitus with foot ulcer: Secondary | ICD-10-CM | POA: Diagnosis not present

## 2018-08-11 DIAGNOSIS — T8141XD Infection following a procedure, superficial incisional surgical site, subsequent encounter: Secondary | ICD-10-CM | POA: Diagnosis not present

## 2018-08-16 DIAGNOSIS — E11621 Type 2 diabetes mellitus with foot ulcer: Secondary | ICD-10-CM | POA: Diagnosis not present

## 2018-08-16 DIAGNOSIS — E1165 Type 2 diabetes mellitus with hyperglycemia: Secondary | ICD-10-CM | POA: Diagnosis not present

## 2018-08-16 DIAGNOSIS — T8141XD Infection following a procedure, superficial incisional surgical site, subsequent encounter: Secondary | ICD-10-CM | POA: Diagnosis not present

## 2018-08-16 DIAGNOSIS — L97521 Non-pressure chronic ulcer of other part of left foot limited to breakdown of skin: Secondary | ICD-10-CM | POA: Diagnosis not present

## 2018-08-16 DIAGNOSIS — E1151 Type 2 diabetes mellitus with diabetic peripheral angiopathy without gangrene: Secondary | ICD-10-CM | POA: Diagnosis not present

## 2018-08-16 DIAGNOSIS — I2510 Atherosclerotic heart disease of native coronary artery without angina pectoris: Secondary | ICD-10-CM | POA: Diagnosis not present

## 2018-08-17 DIAGNOSIS — T8141XD Infection following a procedure, superficial incisional surgical site, subsequent encounter: Secondary | ICD-10-CM | POA: Diagnosis not present

## 2018-08-17 DIAGNOSIS — I2510 Atherosclerotic heart disease of native coronary artery without angina pectoris: Secondary | ICD-10-CM | POA: Diagnosis not present

## 2018-08-17 DIAGNOSIS — E1165 Type 2 diabetes mellitus with hyperglycemia: Secondary | ICD-10-CM | POA: Diagnosis not present

## 2018-08-17 DIAGNOSIS — E1151 Type 2 diabetes mellitus with diabetic peripheral angiopathy without gangrene: Secondary | ICD-10-CM | POA: Diagnosis not present

## 2018-08-17 DIAGNOSIS — L97521 Non-pressure chronic ulcer of other part of left foot limited to breakdown of skin: Secondary | ICD-10-CM | POA: Diagnosis not present

## 2018-08-17 DIAGNOSIS — E11621 Type 2 diabetes mellitus with foot ulcer: Secondary | ICD-10-CM | POA: Diagnosis not present

## 2018-08-18 DIAGNOSIS — I2510 Atherosclerotic heart disease of native coronary artery without angina pectoris: Secondary | ICD-10-CM | POA: Diagnosis not present

## 2018-08-18 DIAGNOSIS — E1165 Type 2 diabetes mellitus with hyperglycemia: Secondary | ICD-10-CM | POA: Diagnosis not present

## 2018-08-18 DIAGNOSIS — T8141XD Infection following a procedure, superficial incisional surgical site, subsequent encounter: Secondary | ICD-10-CM | POA: Diagnosis not present

## 2018-08-18 DIAGNOSIS — E1151 Type 2 diabetes mellitus with diabetic peripheral angiopathy without gangrene: Secondary | ICD-10-CM | POA: Diagnosis not present

## 2018-08-18 DIAGNOSIS — L97521 Non-pressure chronic ulcer of other part of left foot limited to breakdown of skin: Secondary | ICD-10-CM | POA: Diagnosis not present

## 2018-08-18 DIAGNOSIS — E11621 Type 2 diabetes mellitus with foot ulcer: Secondary | ICD-10-CM | POA: Diagnosis not present

## 2018-08-19 DIAGNOSIS — I2510 Atherosclerotic heart disease of native coronary artery without angina pectoris: Secondary | ICD-10-CM | POA: Diagnosis not present

## 2018-08-19 DIAGNOSIS — T8141XD Infection following a procedure, superficial incisional surgical site, subsequent encounter: Secondary | ICD-10-CM | POA: Diagnosis not present

## 2018-08-19 DIAGNOSIS — L97521 Non-pressure chronic ulcer of other part of left foot limited to breakdown of skin: Secondary | ICD-10-CM | POA: Diagnosis not present

## 2018-08-19 DIAGNOSIS — E1165 Type 2 diabetes mellitus with hyperglycemia: Secondary | ICD-10-CM | POA: Diagnosis not present

## 2018-08-19 DIAGNOSIS — E1151 Type 2 diabetes mellitus with diabetic peripheral angiopathy without gangrene: Secondary | ICD-10-CM | POA: Diagnosis not present

## 2018-08-19 DIAGNOSIS — E11621 Type 2 diabetes mellitus with foot ulcer: Secondary | ICD-10-CM | POA: Diagnosis not present

## 2018-08-23 DIAGNOSIS — I2510 Atherosclerotic heart disease of native coronary artery without angina pectoris: Secondary | ICD-10-CM | POA: Diagnosis not present

## 2018-08-23 DIAGNOSIS — T8141XD Infection following a procedure, superficial incisional surgical site, subsequent encounter: Secondary | ICD-10-CM | POA: Diagnosis not present

## 2018-08-23 DIAGNOSIS — E1165 Type 2 diabetes mellitus with hyperglycemia: Secondary | ICD-10-CM | POA: Diagnosis not present

## 2018-08-23 DIAGNOSIS — E11621 Type 2 diabetes mellitus with foot ulcer: Secondary | ICD-10-CM | POA: Diagnosis not present

## 2018-08-23 DIAGNOSIS — E1151 Type 2 diabetes mellitus with diabetic peripheral angiopathy without gangrene: Secondary | ICD-10-CM | POA: Diagnosis not present

## 2018-08-23 DIAGNOSIS — L97521 Non-pressure chronic ulcer of other part of left foot limited to breakdown of skin: Secondary | ICD-10-CM | POA: Diagnosis not present

## 2018-08-24 DIAGNOSIS — D649 Anemia, unspecified: Secondary | ICD-10-CM | POA: Diagnosis not present

## 2018-08-24 DIAGNOSIS — Z96641 Presence of right artificial hip joint: Secondary | ICD-10-CM | POA: Diagnosis not present

## 2018-08-24 DIAGNOSIS — Z87891 Personal history of nicotine dependence: Secondary | ICD-10-CM | POA: Diagnosis not present

## 2018-08-24 DIAGNOSIS — E1122 Type 2 diabetes mellitus with diabetic chronic kidney disease: Secondary | ICD-10-CM | POA: Diagnosis not present

## 2018-08-24 DIAGNOSIS — I130 Hypertensive heart and chronic kidney disease with heart failure and stage 1 through stage 4 chronic kidney disease, or unspecified chronic kidney disease: Secondary | ICD-10-CM | POA: Diagnosis not present

## 2018-08-24 DIAGNOSIS — N189 Chronic kidney disease, unspecified: Secondary | ICD-10-CM | POA: Diagnosis not present

## 2018-08-24 DIAGNOSIS — E785 Hyperlipidemia, unspecified: Secondary | ICD-10-CM | POA: Diagnosis not present

## 2018-08-24 DIAGNOSIS — E1151 Type 2 diabetes mellitus with diabetic peripheral angiopathy without gangrene: Secondary | ICD-10-CM | POA: Diagnosis not present

## 2018-08-24 DIAGNOSIS — Z7984 Long term (current) use of oral hypoglycemic drugs: Secondary | ICD-10-CM | POA: Diagnosis not present

## 2018-08-24 DIAGNOSIS — J449 Chronic obstructive pulmonary disease, unspecified: Secondary | ICD-10-CM | POA: Diagnosis not present

## 2018-08-24 DIAGNOSIS — S72041D Displaced fracture of base of neck of right femur, subsequent encounter for closed fracture with routine healing: Secondary | ICD-10-CM | POA: Diagnosis not present

## 2018-08-24 DIAGNOSIS — E1140 Type 2 diabetes mellitus with diabetic neuropathy, unspecified: Secondary | ICD-10-CM | POA: Diagnosis not present

## 2018-08-24 DIAGNOSIS — I2510 Atherosclerotic heart disease of native coronary artery without angina pectoris: Secondary | ICD-10-CM | POA: Diagnosis not present

## 2018-08-24 DIAGNOSIS — I5040 Unspecified combined systolic (congestive) and diastolic (congestive) heart failure: Secondary | ICD-10-CM | POA: Diagnosis not present

## 2018-08-24 DIAGNOSIS — Z8631 Personal history of diabetic foot ulcer: Secondary | ICD-10-CM | POA: Diagnosis not present

## 2018-08-26 ENCOUNTER — Other Ambulatory Visit: Payer: Self-pay

## 2018-08-26 ENCOUNTER — Encounter: Payer: Self-pay | Admitting: Vascular Surgery

## 2018-08-26 ENCOUNTER — Ambulatory Visit (INDEPENDENT_AMBULATORY_CARE_PROVIDER_SITE_OTHER): Payer: Medicare Other | Admitting: Vascular Surgery

## 2018-08-26 VITALS — BP 146/69 | HR 76 | Temp 97.5°F | Resp 20 | Ht 60.0 in | Wt 137.0 lb

## 2018-08-26 DIAGNOSIS — I739 Peripheral vascular disease, unspecified: Secondary | ICD-10-CM | POA: Diagnosis not present

## 2018-08-26 NOTE — Progress Notes (Signed)
Patient is a 85 year old female who returns for follow-up today.  Underwent left common femoral endarterectomy profundoplasty and left femoral to anterior tibial artery bypass with PTFE and vein May 03, 2018.  She has now healed all of the wounds in her left lower extremity.  She has also essentially healed the ulceration over her left first metatarsal head which was the reason for the operation.  She still has chronic leg swelling.  Physical exam:  Vitals:   08/26/18 1124  BP: (!) 146/69  Pulse: 76  Resp: 20  Temp: (!) 97.5 F (36.4 C)  SpO2: 98%  Weight: 137 lb (62.1 kg)  Height: 5' (1.524 m)    Extremities: 2+ graft pulse left leg all wounds left leg completely healed 3+ edema bilaterally both legs  Assessment: Healed wounds left leg patent bypass  Plan: Patient will follow-up with our nurse practitioner in 3 months time with bilateral graft duplex and bilateral ABIs.  Ruta Hinds, MD [facility] [place] Office: [phone] Pager: [phone]

## 2018-08-27 DIAGNOSIS — E1140 Type 2 diabetes mellitus with diabetic neuropathy, unspecified: Secondary | ICD-10-CM | POA: Diagnosis not present

## 2018-08-27 DIAGNOSIS — E1151 Type 2 diabetes mellitus with diabetic peripheral angiopathy without gangrene: Secondary | ICD-10-CM | POA: Diagnosis not present

## 2018-08-27 DIAGNOSIS — I2510 Atherosclerotic heart disease of native coronary artery without angina pectoris: Secondary | ICD-10-CM | POA: Diagnosis not present

## 2018-08-27 DIAGNOSIS — S72041D Displaced fracture of base of neck of right femur, subsequent encounter for closed fracture with routine healing: Secondary | ICD-10-CM | POA: Diagnosis not present

## 2018-08-27 DIAGNOSIS — I130 Hypertensive heart and chronic kidney disease with heart failure and stage 1 through stage 4 chronic kidney disease, or unspecified chronic kidney disease: Secondary | ICD-10-CM | POA: Diagnosis not present

## 2018-08-27 DIAGNOSIS — I5040 Unspecified combined systolic (congestive) and diastolic (congestive) heart failure: Secondary | ICD-10-CM | POA: Diagnosis not present

## 2018-08-30 DIAGNOSIS — S72041D Displaced fracture of base of neck of right femur, subsequent encounter for closed fracture with routine healing: Secondary | ICD-10-CM | POA: Diagnosis not present

## 2018-08-30 DIAGNOSIS — I2510 Atherosclerotic heart disease of native coronary artery without angina pectoris: Secondary | ICD-10-CM | POA: Diagnosis not present

## 2018-08-30 DIAGNOSIS — E1140 Type 2 diabetes mellitus with diabetic neuropathy, unspecified: Secondary | ICD-10-CM | POA: Diagnosis not present

## 2018-08-30 DIAGNOSIS — I5040 Unspecified combined systolic (congestive) and diastolic (congestive) heart failure: Secondary | ICD-10-CM | POA: Diagnosis not present

## 2018-08-30 DIAGNOSIS — I130 Hypertensive heart and chronic kidney disease with heart failure and stage 1 through stage 4 chronic kidney disease, or unspecified chronic kidney disease: Secondary | ICD-10-CM | POA: Diagnosis not present

## 2018-08-30 DIAGNOSIS — E1151 Type 2 diabetes mellitus with diabetic peripheral angiopathy without gangrene: Secondary | ICD-10-CM | POA: Diagnosis not present

## 2018-09-01 DIAGNOSIS — I130 Hypertensive heart and chronic kidney disease with heart failure and stage 1 through stage 4 chronic kidney disease, or unspecified chronic kidney disease: Secondary | ICD-10-CM | POA: Diagnosis not present

## 2018-09-01 DIAGNOSIS — E1151 Type 2 diabetes mellitus with diabetic peripheral angiopathy without gangrene: Secondary | ICD-10-CM | POA: Diagnosis not present

## 2018-09-01 DIAGNOSIS — I2510 Atherosclerotic heart disease of native coronary artery without angina pectoris: Secondary | ICD-10-CM | POA: Diagnosis not present

## 2018-09-01 DIAGNOSIS — S72041D Displaced fracture of base of neck of right femur, subsequent encounter for closed fracture with routine healing: Secondary | ICD-10-CM | POA: Diagnosis not present

## 2018-09-01 DIAGNOSIS — E1140 Type 2 diabetes mellitus with diabetic neuropathy, unspecified: Secondary | ICD-10-CM | POA: Diagnosis not present

## 2018-09-01 DIAGNOSIS — I5040 Unspecified combined systolic (congestive) and diastolic (congestive) heart failure: Secondary | ICD-10-CM | POA: Diagnosis not present

## 2018-09-03 DIAGNOSIS — E1140 Type 2 diabetes mellitus with diabetic neuropathy, unspecified: Secondary | ICD-10-CM | POA: Diagnosis not present

## 2018-09-03 DIAGNOSIS — I5040 Unspecified combined systolic (congestive) and diastolic (congestive) heart failure: Secondary | ICD-10-CM | POA: Diagnosis not present

## 2018-09-03 DIAGNOSIS — S72041D Displaced fracture of base of neck of right femur, subsequent encounter for closed fracture with routine healing: Secondary | ICD-10-CM | POA: Diagnosis not present

## 2018-09-03 DIAGNOSIS — E1151 Type 2 diabetes mellitus with diabetic peripheral angiopathy without gangrene: Secondary | ICD-10-CM | POA: Diagnosis not present

## 2018-09-03 DIAGNOSIS — I130 Hypertensive heart and chronic kidney disease with heart failure and stage 1 through stage 4 chronic kidney disease, or unspecified chronic kidney disease: Secondary | ICD-10-CM | POA: Diagnosis not present

## 2018-09-03 DIAGNOSIS — I2510 Atherosclerotic heart disease of native coronary artery without angina pectoris: Secondary | ICD-10-CM | POA: Diagnosis not present

## 2018-09-06 DIAGNOSIS — E1140 Type 2 diabetes mellitus with diabetic neuropathy, unspecified: Secondary | ICD-10-CM | POA: Diagnosis not present

## 2018-09-06 DIAGNOSIS — I5040 Unspecified combined systolic (congestive) and diastolic (congestive) heart failure: Secondary | ICD-10-CM | POA: Diagnosis not present

## 2018-09-06 DIAGNOSIS — I2510 Atherosclerotic heart disease of native coronary artery without angina pectoris: Secondary | ICD-10-CM | POA: Diagnosis not present

## 2018-09-06 DIAGNOSIS — E1151 Type 2 diabetes mellitus with diabetic peripheral angiopathy without gangrene: Secondary | ICD-10-CM | POA: Diagnosis not present

## 2018-09-06 DIAGNOSIS — S72041D Displaced fracture of base of neck of right femur, subsequent encounter for closed fracture with routine healing: Secondary | ICD-10-CM | POA: Diagnosis not present

## 2018-09-06 DIAGNOSIS — I130 Hypertensive heart and chronic kidney disease with heart failure and stage 1 through stage 4 chronic kidney disease, or unspecified chronic kidney disease: Secondary | ICD-10-CM | POA: Diagnosis not present

## 2018-09-07 DIAGNOSIS — L03116 Cellulitis of left lower limb: Secondary | ICD-10-CM | POA: Diagnosis not present

## 2018-09-07 DIAGNOSIS — E7849 Other hyperlipidemia: Secondary | ICD-10-CM | POA: Diagnosis not present

## 2018-09-07 DIAGNOSIS — E1151 Type 2 diabetes mellitus with diabetic peripheral angiopathy without gangrene: Secondary | ICD-10-CM | POA: Diagnosis not present

## 2018-09-07 DIAGNOSIS — I130 Hypertensive heart and chronic kidney disease with heart failure and stage 1 through stage 4 chronic kidney disease, or unspecified chronic kidney disease: Secondary | ICD-10-CM | POA: Diagnosis not present

## 2018-09-07 DIAGNOSIS — M75111 Incomplete rotator cuff tear or rupture of right shoulder, not specified as traumatic: Secondary | ICD-10-CM | POA: Diagnosis not present

## 2018-09-07 DIAGNOSIS — E1140 Type 2 diabetes mellitus with diabetic neuropathy, unspecified: Secondary | ICD-10-CM | POA: Diagnosis not present

## 2018-09-07 DIAGNOSIS — I10 Essential (primary) hypertension: Secondary | ICD-10-CM | POA: Diagnosis not present

## 2018-09-07 DIAGNOSIS — E782 Mixed hyperlipidemia: Secondary | ICD-10-CM | POA: Diagnosis not present

## 2018-09-07 DIAGNOSIS — M545 Low back pain: Secondary | ICD-10-CM | POA: Diagnosis not present

## 2018-09-07 DIAGNOSIS — I2510 Atherosclerotic heart disease of native coronary artery without angina pectoris: Secondary | ICD-10-CM | POA: Diagnosis not present

## 2018-09-07 DIAGNOSIS — E119 Type 2 diabetes mellitus without complications: Secondary | ICD-10-CM | POA: Diagnosis not present

## 2018-09-07 DIAGNOSIS — I5040 Unspecified combined systolic (congestive) and diastolic (congestive) heart failure: Secondary | ICD-10-CM | POA: Diagnosis not present

## 2018-09-07 DIAGNOSIS — S72041D Displaced fracture of base of neck of right femur, subsequent encounter for closed fracture with routine healing: Secondary | ICD-10-CM | POA: Diagnosis not present

## 2018-09-08 DIAGNOSIS — S72041D Displaced fracture of base of neck of right femur, subsequent encounter for closed fracture with routine healing: Secondary | ICD-10-CM | POA: Diagnosis not present

## 2018-09-08 DIAGNOSIS — I5040 Unspecified combined systolic (congestive) and diastolic (congestive) heart failure: Secondary | ICD-10-CM | POA: Diagnosis not present

## 2018-09-08 DIAGNOSIS — E1151 Type 2 diabetes mellitus with diabetic peripheral angiopathy without gangrene: Secondary | ICD-10-CM | POA: Diagnosis not present

## 2018-09-08 DIAGNOSIS — I2510 Atherosclerotic heart disease of native coronary artery without angina pectoris: Secondary | ICD-10-CM | POA: Diagnosis not present

## 2018-09-08 DIAGNOSIS — I130 Hypertensive heart and chronic kidney disease with heart failure and stage 1 through stage 4 chronic kidney disease, or unspecified chronic kidney disease: Secondary | ICD-10-CM | POA: Diagnosis not present

## 2018-09-08 DIAGNOSIS — E1140 Type 2 diabetes mellitus with diabetic neuropathy, unspecified: Secondary | ICD-10-CM | POA: Diagnosis not present

## 2018-09-10 DIAGNOSIS — I5040 Unspecified combined systolic (congestive) and diastolic (congestive) heart failure: Secondary | ICD-10-CM | POA: Diagnosis not present

## 2018-09-10 DIAGNOSIS — I2510 Atherosclerotic heart disease of native coronary artery without angina pectoris: Secondary | ICD-10-CM | POA: Diagnosis not present

## 2018-09-10 DIAGNOSIS — E1140 Type 2 diabetes mellitus with diabetic neuropathy, unspecified: Secondary | ICD-10-CM | POA: Diagnosis not present

## 2018-09-10 DIAGNOSIS — S72041D Displaced fracture of base of neck of right femur, subsequent encounter for closed fracture with routine healing: Secondary | ICD-10-CM | POA: Diagnosis not present

## 2018-09-10 DIAGNOSIS — E1151 Type 2 diabetes mellitus with diabetic peripheral angiopathy without gangrene: Secondary | ICD-10-CM | POA: Diagnosis not present

## 2018-09-10 DIAGNOSIS — I130 Hypertensive heart and chronic kidney disease with heart failure and stage 1 through stage 4 chronic kidney disease, or unspecified chronic kidney disease: Secondary | ICD-10-CM | POA: Diagnosis not present

## 2018-09-13 DIAGNOSIS — I2510 Atherosclerotic heart disease of native coronary artery without angina pectoris: Secondary | ICD-10-CM | POA: Diagnosis not present

## 2018-09-13 DIAGNOSIS — I5040 Unspecified combined systolic (congestive) and diastolic (congestive) heart failure: Secondary | ICD-10-CM | POA: Diagnosis not present

## 2018-09-13 DIAGNOSIS — E1151 Type 2 diabetes mellitus with diabetic peripheral angiopathy without gangrene: Secondary | ICD-10-CM | POA: Diagnosis not present

## 2018-09-13 DIAGNOSIS — E1140 Type 2 diabetes mellitus with diabetic neuropathy, unspecified: Secondary | ICD-10-CM | POA: Diagnosis not present

## 2018-09-13 DIAGNOSIS — I130 Hypertensive heart and chronic kidney disease with heart failure and stage 1 through stage 4 chronic kidney disease, or unspecified chronic kidney disease: Secondary | ICD-10-CM | POA: Diagnosis not present

## 2018-09-13 DIAGNOSIS — S72041D Displaced fracture of base of neck of right femur, subsequent encounter for closed fracture with routine healing: Secondary | ICD-10-CM | POA: Diagnosis not present

## 2018-09-14 DIAGNOSIS — I130 Hypertensive heart and chronic kidney disease with heart failure and stage 1 through stage 4 chronic kidney disease, or unspecified chronic kidney disease: Secondary | ICD-10-CM | POA: Diagnosis not present

## 2018-09-14 DIAGNOSIS — I5040 Unspecified combined systolic (congestive) and diastolic (congestive) heart failure: Secondary | ICD-10-CM | POA: Diagnosis not present

## 2018-09-14 DIAGNOSIS — E1140 Type 2 diabetes mellitus with diabetic neuropathy, unspecified: Secondary | ICD-10-CM | POA: Diagnosis not present

## 2018-09-14 DIAGNOSIS — S72041D Displaced fracture of base of neck of right femur, subsequent encounter for closed fracture with routine healing: Secondary | ICD-10-CM | POA: Diagnosis not present

## 2018-09-14 DIAGNOSIS — I2510 Atherosclerotic heart disease of native coronary artery without angina pectoris: Secondary | ICD-10-CM | POA: Diagnosis not present

## 2018-09-14 DIAGNOSIS — E1151 Type 2 diabetes mellitus with diabetic peripheral angiopathy without gangrene: Secondary | ICD-10-CM | POA: Diagnosis not present

## 2018-09-16 DIAGNOSIS — E1151 Type 2 diabetes mellitus with diabetic peripheral angiopathy without gangrene: Secondary | ICD-10-CM | POA: Diagnosis not present

## 2018-09-16 DIAGNOSIS — E1140 Type 2 diabetes mellitus with diabetic neuropathy, unspecified: Secondary | ICD-10-CM | POA: Diagnosis not present

## 2018-09-16 DIAGNOSIS — S72041D Displaced fracture of base of neck of right femur, subsequent encounter for closed fracture with routine healing: Secondary | ICD-10-CM | POA: Diagnosis not present

## 2018-09-16 DIAGNOSIS — I130 Hypertensive heart and chronic kidney disease with heart failure and stage 1 through stage 4 chronic kidney disease, or unspecified chronic kidney disease: Secondary | ICD-10-CM | POA: Diagnosis not present

## 2018-09-16 DIAGNOSIS — I2510 Atherosclerotic heart disease of native coronary artery without angina pectoris: Secondary | ICD-10-CM | POA: Diagnosis not present

## 2018-09-16 DIAGNOSIS — I5040 Unspecified combined systolic (congestive) and diastolic (congestive) heart failure: Secondary | ICD-10-CM | POA: Diagnosis not present

## 2018-09-20 DIAGNOSIS — S72041D Displaced fracture of base of neck of right femur, subsequent encounter for closed fracture with routine healing: Secondary | ICD-10-CM | POA: Diagnosis not present

## 2018-09-20 DIAGNOSIS — E1151 Type 2 diabetes mellitus with diabetic peripheral angiopathy without gangrene: Secondary | ICD-10-CM | POA: Diagnosis not present

## 2018-09-20 DIAGNOSIS — I5040 Unspecified combined systolic (congestive) and diastolic (congestive) heart failure: Secondary | ICD-10-CM | POA: Diagnosis not present

## 2018-09-20 DIAGNOSIS — E1140 Type 2 diabetes mellitus with diabetic neuropathy, unspecified: Secondary | ICD-10-CM | POA: Diagnosis not present

## 2018-09-20 DIAGNOSIS — I2510 Atherosclerotic heart disease of native coronary artery without angina pectoris: Secondary | ICD-10-CM | POA: Diagnosis not present

## 2018-09-20 DIAGNOSIS — I130 Hypertensive heart and chronic kidney disease with heart failure and stage 1 through stage 4 chronic kidney disease, or unspecified chronic kidney disease: Secondary | ICD-10-CM | POA: Diagnosis not present

## 2018-09-22 DIAGNOSIS — I5040 Unspecified combined systolic (congestive) and diastolic (congestive) heart failure: Secondary | ICD-10-CM | POA: Diagnosis not present

## 2018-09-22 DIAGNOSIS — I130 Hypertensive heart and chronic kidney disease with heart failure and stage 1 through stage 4 chronic kidney disease, or unspecified chronic kidney disease: Secondary | ICD-10-CM | POA: Diagnosis not present

## 2018-09-22 DIAGNOSIS — E1140 Type 2 diabetes mellitus with diabetic neuropathy, unspecified: Secondary | ICD-10-CM | POA: Diagnosis not present

## 2018-09-22 DIAGNOSIS — S72041D Displaced fracture of base of neck of right femur, subsequent encounter for closed fracture with routine healing: Secondary | ICD-10-CM | POA: Diagnosis not present

## 2018-09-22 DIAGNOSIS — I2510 Atherosclerotic heart disease of native coronary artery without angina pectoris: Secondary | ICD-10-CM | POA: Diagnosis not present

## 2018-09-22 DIAGNOSIS — E1151 Type 2 diabetes mellitus with diabetic peripheral angiopathy without gangrene: Secondary | ICD-10-CM | POA: Diagnosis not present

## 2018-09-23 DIAGNOSIS — Z96641 Presence of right artificial hip joint: Secondary | ICD-10-CM | POA: Diagnosis not present

## 2018-09-23 DIAGNOSIS — E1151 Type 2 diabetes mellitus with diabetic peripheral angiopathy without gangrene: Secondary | ICD-10-CM | POA: Diagnosis not present

## 2018-09-23 DIAGNOSIS — J449 Chronic obstructive pulmonary disease, unspecified: Secondary | ICD-10-CM | POA: Diagnosis not present

## 2018-09-23 DIAGNOSIS — E785 Hyperlipidemia, unspecified: Secondary | ICD-10-CM | POA: Diagnosis not present

## 2018-09-23 DIAGNOSIS — I130 Hypertensive heart and chronic kidney disease with heart failure and stage 1 through stage 4 chronic kidney disease, or unspecified chronic kidney disease: Secondary | ICD-10-CM | POA: Diagnosis not present

## 2018-09-23 DIAGNOSIS — S72041D Displaced fracture of base of neck of right femur, subsequent encounter for closed fracture with routine healing: Secondary | ICD-10-CM | POA: Diagnosis not present

## 2018-09-23 DIAGNOSIS — D649 Anemia, unspecified: Secondary | ICD-10-CM | POA: Diagnosis not present

## 2018-09-23 DIAGNOSIS — N189 Chronic kidney disease, unspecified: Secondary | ICD-10-CM | POA: Diagnosis not present

## 2018-09-23 DIAGNOSIS — I5040 Unspecified combined systolic (congestive) and diastolic (congestive) heart failure: Secondary | ICD-10-CM | POA: Diagnosis not present

## 2018-09-23 DIAGNOSIS — E1122 Type 2 diabetes mellitus with diabetic chronic kidney disease: Secondary | ICD-10-CM | POA: Diagnosis not present

## 2018-09-23 DIAGNOSIS — Z7984 Long term (current) use of oral hypoglycemic drugs: Secondary | ICD-10-CM | POA: Diagnosis not present

## 2018-09-23 DIAGNOSIS — I2510 Atherosclerotic heart disease of native coronary artery without angina pectoris: Secondary | ICD-10-CM | POA: Diagnosis not present

## 2018-09-23 DIAGNOSIS — Z87891 Personal history of nicotine dependence: Secondary | ICD-10-CM | POA: Diagnosis not present

## 2018-09-23 DIAGNOSIS — E1140 Type 2 diabetes mellitus with diabetic neuropathy, unspecified: Secondary | ICD-10-CM | POA: Diagnosis not present

## 2018-09-23 DIAGNOSIS — Z8631 Personal history of diabetic foot ulcer: Secondary | ICD-10-CM | POA: Diagnosis not present

## 2018-09-27 DIAGNOSIS — Z79899 Other long term (current) drug therapy: Secondary | ICD-10-CM | POA: Diagnosis not present

## 2018-09-27 DIAGNOSIS — S81801A Unspecified open wound, right lower leg, initial encounter: Secondary | ICD-10-CM | POA: Diagnosis not present

## 2018-09-27 DIAGNOSIS — Z7902 Long term (current) use of antithrombotics/antiplatelets: Secondary | ICD-10-CM | POA: Diagnosis not present

## 2018-09-27 DIAGNOSIS — S81811A Laceration without foreign body, right lower leg, initial encounter: Secondary | ICD-10-CM | POA: Diagnosis not present

## 2018-09-27 DIAGNOSIS — L0390 Cellulitis, unspecified: Secondary | ICD-10-CM | POA: Diagnosis not present

## 2018-09-27 DIAGNOSIS — I10 Essential (primary) hypertension: Secondary | ICD-10-CM | POA: Diagnosis not present

## 2018-09-27 DIAGNOSIS — Z87891 Personal history of nicotine dependence: Secondary | ICD-10-CM | POA: Diagnosis not present

## 2018-09-27 DIAGNOSIS — E1151 Type 2 diabetes mellitus with diabetic peripheral angiopathy without gangrene: Secondary | ICD-10-CM | POA: Diagnosis not present

## 2018-09-27 DIAGNOSIS — E7800 Pure hypercholesterolemia, unspecified: Secondary | ICD-10-CM | POA: Diagnosis not present

## 2018-09-27 DIAGNOSIS — Z7982 Long term (current) use of aspirin: Secondary | ICD-10-CM | POA: Diagnosis not present

## 2018-09-27 DIAGNOSIS — L03115 Cellulitis of right lower limb: Secondary | ICD-10-CM | POA: Diagnosis not present

## 2018-09-27 DIAGNOSIS — Z7984 Long term (current) use of oral hypoglycemic drugs: Secondary | ICD-10-CM | POA: Diagnosis not present

## 2018-09-27 DIAGNOSIS — X58XXXA Exposure to other specified factors, initial encounter: Secondary | ICD-10-CM | POA: Diagnosis not present

## 2018-09-28 DIAGNOSIS — I130 Hypertensive heart and chronic kidney disease with heart failure and stage 1 through stage 4 chronic kidney disease, or unspecified chronic kidney disease: Secondary | ICD-10-CM | POA: Diagnosis not present

## 2018-09-28 DIAGNOSIS — E1151 Type 2 diabetes mellitus with diabetic peripheral angiopathy without gangrene: Secondary | ICD-10-CM | POA: Diagnosis not present

## 2018-09-28 DIAGNOSIS — I5040 Unspecified combined systolic (congestive) and diastolic (congestive) heart failure: Secondary | ICD-10-CM | POA: Diagnosis not present

## 2018-09-28 DIAGNOSIS — S72041D Displaced fracture of base of neck of right femur, subsequent encounter for closed fracture with routine healing: Secondary | ICD-10-CM | POA: Diagnosis not present

## 2018-09-28 DIAGNOSIS — I2510 Atherosclerotic heart disease of native coronary artery without angina pectoris: Secondary | ICD-10-CM | POA: Diagnosis not present

## 2018-09-28 DIAGNOSIS — E1140 Type 2 diabetes mellitus with diabetic neuropathy, unspecified: Secondary | ICD-10-CM | POA: Diagnosis not present

## 2018-09-29 DIAGNOSIS — E1151 Type 2 diabetes mellitus with diabetic peripheral angiopathy without gangrene: Secondary | ICD-10-CM | POA: Diagnosis not present

## 2018-09-29 DIAGNOSIS — I130 Hypertensive heart and chronic kidney disease with heart failure and stage 1 through stage 4 chronic kidney disease, or unspecified chronic kidney disease: Secondary | ICD-10-CM | POA: Diagnosis not present

## 2018-09-29 DIAGNOSIS — I2510 Atherosclerotic heart disease of native coronary artery without angina pectoris: Secondary | ICD-10-CM | POA: Diagnosis not present

## 2018-09-29 DIAGNOSIS — S72041D Displaced fracture of base of neck of right femur, subsequent encounter for closed fracture with routine healing: Secondary | ICD-10-CM | POA: Diagnosis not present

## 2018-09-29 DIAGNOSIS — E1140 Type 2 diabetes mellitus with diabetic neuropathy, unspecified: Secondary | ICD-10-CM | POA: Diagnosis not present

## 2018-09-29 DIAGNOSIS — I5040 Unspecified combined systolic (congestive) and diastolic (congestive) heart failure: Secondary | ICD-10-CM | POA: Diagnosis not present

## 2018-10-01 DIAGNOSIS — E1140 Type 2 diabetes mellitus with diabetic neuropathy, unspecified: Secondary | ICD-10-CM | POA: Diagnosis not present

## 2018-10-01 DIAGNOSIS — E1151 Type 2 diabetes mellitus with diabetic peripheral angiopathy without gangrene: Secondary | ICD-10-CM | POA: Diagnosis not present

## 2018-10-01 DIAGNOSIS — S72041D Displaced fracture of base of neck of right femur, subsequent encounter for closed fracture with routine healing: Secondary | ICD-10-CM | POA: Diagnosis not present

## 2018-10-01 DIAGNOSIS — I130 Hypertensive heart and chronic kidney disease with heart failure and stage 1 through stage 4 chronic kidney disease, or unspecified chronic kidney disease: Secondary | ICD-10-CM | POA: Diagnosis not present

## 2018-10-01 DIAGNOSIS — I2510 Atherosclerotic heart disease of native coronary artery without angina pectoris: Secondary | ICD-10-CM | POA: Diagnosis not present

## 2018-10-01 DIAGNOSIS — I5040 Unspecified combined systolic (congestive) and diastolic (congestive) heart failure: Secondary | ICD-10-CM | POA: Diagnosis not present

## 2018-10-04 DIAGNOSIS — I130 Hypertensive heart and chronic kidney disease with heart failure and stage 1 through stage 4 chronic kidney disease, or unspecified chronic kidney disease: Secondary | ICD-10-CM | POA: Diagnosis not present

## 2018-10-04 DIAGNOSIS — I5040 Unspecified combined systolic (congestive) and diastolic (congestive) heart failure: Secondary | ICD-10-CM | POA: Diagnosis not present

## 2018-10-04 DIAGNOSIS — S72041D Displaced fracture of base of neck of right femur, subsequent encounter for closed fracture with routine healing: Secondary | ICD-10-CM | POA: Diagnosis not present

## 2018-10-04 DIAGNOSIS — E1151 Type 2 diabetes mellitus with diabetic peripheral angiopathy without gangrene: Secondary | ICD-10-CM | POA: Diagnosis not present

## 2018-10-04 DIAGNOSIS — E1140 Type 2 diabetes mellitus with diabetic neuropathy, unspecified: Secondary | ICD-10-CM | POA: Diagnosis not present

## 2018-10-04 DIAGNOSIS — I2510 Atherosclerotic heart disease of native coronary artery without angina pectoris: Secondary | ICD-10-CM | POA: Diagnosis not present

## 2018-10-06 DIAGNOSIS — I5040 Unspecified combined systolic (congestive) and diastolic (congestive) heart failure: Secondary | ICD-10-CM | POA: Diagnosis not present

## 2018-10-06 DIAGNOSIS — E1140 Type 2 diabetes mellitus with diabetic neuropathy, unspecified: Secondary | ICD-10-CM | POA: Diagnosis not present

## 2018-10-06 DIAGNOSIS — I130 Hypertensive heart and chronic kidney disease with heart failure and stage 1 through stage 4 chronic kidney disease, or unspecified chronic kidney disease: Secondary | ICD-10-CM | POA: Diagnosis not present

## 2018-10-06 DIAGNOSIS — I2510 Atherosclerotic heart disease of native coronary artery without angina pectoris: Secondary | ICD-10-CM | POA: Diagnosis not present

## 2018-10-06 DIAGNOSIS — E1151 Type 2 diabetes mellitus with diabetic peripheral angiopathy without gangrene: Secondary | ICD-10-CM | POA: Diagnosis not present

## 2018-10-06 DIAGNOSIS — S72041D Displaced fracture of base of neck of right femur, subsequent encounter for closed fracture with routine healing: Secondary | ICD-10-CM | POA: Diagnosis not present

## 2018-10-07 DIAGNOSIS — E1140 Type 2 diabetes mellitus with diabetic neuropathy, unspecified: Secondary | ICD-10-CM | POA: Diagnosis not present

## 2018-10-07 DIAGNOSIS — E1151 Type 2 diabetes mellitus with diabetic peripheral angiopathy without gangrene: Secondary | ICD-10-CM | POA: Diagnosis not present

## 2018-10-07 DIAGNOSIS — I2510 Atherosclerotic heart disease of native coronary artery without angina pectoris: Secondary | ICD-10-CM | POA: Diagnosis not present

## 2018-10-07 DIAGNOSIS — S72041D Displaced fracture of base of neck of right femur, subsequent encounter for closed fracture with routine healing: Secondary | ICD-10-CM | POA: Diagnosis not present

## 2018-10-07 DIAGNOSIS — I130 Hypertensive heart and chronic kidney disease with heart failure and stage 1 through stage 4 chronic kidney disease, or unspecified chronic kidney disease: Secondary | ICD-10-CM | POA: Diagnosis not present

## 2018-10-07 DIAGNOSIS — I5040 Unspecified combined systolic (congestive) and diastolic (congestive) heart failure: Secondary | ICD-10-CM | POA: Diagnosis not present

## 2018-10-11 DIAGNOSIS — I5040 Unspecified combined systolic (congestive) and diastolic (congestive) heart failure: Secondary | ICD-10-CM | POA: Diagnosis not present

## 2018-10-11 DIAGNOSIS — E1140 Type 2 diabetes mellitus with diabetic neuropathy, unspecified: Secondary | ICD-10-CM | POA: Diagnosis not present

## 2018-10-11 DIAGNOSIS — E1151 Type 2 diabetes mellitus with diabetic peripheral angiopathy without gangrene: Secondary | ICD-10-CM | POA: Diagnosis not present

## 2018-10-11 DIAGNOSIS — I2510 Atherosclerotic heart disease of native coronary artery without angina pectoris: Secondary | ICD-10-CM | POA: Diagnosis not present

## 2018-10-11 DIAGNOSIS — S72041D Displaced fracture of base of neck of right femur, subsequent encounter for closed fracture with routine healing: Secondary | ICD-10-CM | POA: Diagnosis not present

## 2018-10-11 DIAGNOSIS — I130 Hypertensive heart and chronic kidney disease with heart failure and stage 1 through stage 4 chronic kidney disease, or unspecified chronic kidney disease: Secondary | ICD-10-CM | POA: Diagnosis not present

## 2018-10-13 DIAGNOSIS — I2510 Atherosclerotic heart disease of native coronary artery without angina pectoris: Secondary | ICD-10-CM | POA: Diagnosis not present

## 2018-10-13 DIAGNOSIS — E1140 Type 2 diabetes mellitus with diabetic neuropathy, unspecified: Secondary | ICD-10-CM | POA: Diagnosis not present

## 2018-10-13 DIAGNOSIS — E1151 Type 2 diabetes mellitus with diabetic peripheral angiopathy without gangrene: Secondary | ICD-10-CM | POA: Diagnosis not present

## 2018-10-13 DIAGNOSIS — I5040 Unspecified combined systolic (congestive) and diastolic (congestive) heart failure: Secondary | ICD-10-CM | POA: Diagnosis not present

## 2018-10-13 DIAGNOSIS — I130 Hypertensive heart and chronic kidney disease with heart failure and stage 1 through stage 4 chronic kidney disease, or unspecified chronic kidney disease: Secondary | ICD-10-CM | POA: Diagnosis not present

## 2018-10-13 DIAGNOSIS — S72041D Displaced fracture of base of neck of right femur, subsequent encounter for closed fracture with routine healing: Secondary | ICD-10-CM | POA: Diagnosis not present

## 2018-10-18 DIAGNOSIS — I130 Hypertensive heart and chronic kidney disease with heart failure and stage 1 through stage 4 chronic kidney disease, or unspecified chronic kidney disease: Secondary | ICD-10-CM | POA: Diagnosis not present

## 2018-10-18 DIAGNOSIS — I2510 Atherosclerotic heart disease of native coronary artery without angina pectoris: Secondary | ICD-10-CM | POA: Diagnosis not present

## 2018-10-18 DIAGNOSIS — E1151 Type 2 diabetes mellitus with diabetic peripheral angiopathy without gangrene: Secondary | ICD-10-CM | POA: Diagnosis not present

## 2018-10-18 DIAGNOSIS — E1140 Type 2 diabetes mellitus with diabetic neuropathy, unspecified: Secondary | ICD-10-CM | POA: Diagnosis not present

## 2018-10-18 DIAGNOSIS — S72041D Displaced fracture of base of neck of right femur, subsequent encounter for closed fracture with routine healing: Secondary | ICD-10-CM | POA: Diagnosis not present

## 2018-10-18 DIAGNOSIS — I5040 Unspecified combined systolic (congestive) and diastolic (congestive) heart failure: Secondary | ICD-10-CM | POA: Diagnosis not present

## 2018-10-20 DIAGNOSIS — E1140 Type 2 diabetes mellitus with diabetic neuropathy, unspecified: Secondary | ICD-10-CM | POA: Diagnosis not present

## 2018-10-20 DIAGNOSIS — S72041D Displaced fracture of base of neck of right femur, subsequent encounter for closed fracture with routine healing: Secondary | ICD-10-CM | POA: Diagnosis not present

## 2018-10-20 DIAGNOSIS — I130 Hypertensive heart and chronic kidney disease with heart failure and stage 1 through stage 4 chronic kidney disease, or unspecified chronic kidney disease: Secondary | ICD-10-CM | POA: Diagnosis not present

## 2018-10-20 DIAGNOSIS — E1151 Type 2 diabetes mellitus with diabetic peripheral angiopathy without gangrene: Secondary | ICD-10-CM | POA: Diagnosis not present

## 2018-10-20 DIAGNOSIS — I2510 Atherosclerotic heart disease of native coronary artery without angina pectoris: Secondary | ICD-10-CM | POA: Diagnosis not present

## 2018-10-20 DIAGNOSIS — I5040 Unspecified combined systolic (congestive) and diastolic (congestive) heart failure: Secondary | ICD-10-CM | POA: Diagnosis not present

## 2018-11-25 ENCOUNTER — Encounter (HOSPITAL_COMMUNITY): Payer: 59

## 2018-11-25 ENCOUNTER — Other Ambulatory Visit (HOSPITAL_COMMUNITY): Payer: 59

## 2018-11-25 ENCOUNTER — Ambulatory Visit: Payer: 59 | Admitting: Vascular Surgery

## 2018-12-06 DIAGNOSIS — M75111 Incomplete rotator cuff tear or rupture of right shoulder, not specified as traumatic: Secondary | ICD-10-CM | POA: Diagnosis not present

## 2018-12-06 DIAGNOSIS — E119 Type 2 diabetes mellitus without complications: Secondary | ICD-10-CM | POA: Diagnosis not present

## 2018-12-06 DIAGNOSIS — L0390 Cellulitis, unspecified: Secondary | ICD-10-CM | POA: Diagnosis not present

## 2018-12-06 DIAGNOSIS — I10 Essential (primary) hypertension: Secondary | ICD-10-CM | POA: Diagnosis not present

## 2018-12-06 DIAGNOSIS — E7849 Other hyperlipidemia: Secondary | ICD-10-CM | POA: Diagnosis not present

## 2019-01-11 DIAGNOSIS — H353113 Nonexudative age-related macular degeneration, right eye, advanced atrophic without subfoveal involvement: Secondary | ICD-10-CM | POA: Diagnosis not present

## 2019-01-11 DIAGNOSIS — H43813 Vitreous degeneration, bilateral: Secondary | ICD-10-CM | POA: Diagnosis not present

## 2019-01-11 DIAGNOSIS — E113493 Type 2 diabetes mellitus with severe nonproliferative diabetic retinopathy without macular edema, bilateral: Secondary | ICD-10-CM | POA: Diagnosis not present

## 2019-01-11 DIAGNOSIS — H353124 Nonexudative age-related macular degeneration, left eye, advanced atrophic with subfoveal involvement: Secondary | ICD-10-CM | POA: Diagnosis not present

## 2019-02-25 ENCOUNTER — Other Ambulatory Visit: Payer: Self-pay

## 2019-03-08 ENCOUNTER — Other Ambulatory Visit: Payer: Self-pay | Admitting: *Deleted

## 2019-03-08 MED ORDER — AMLODIPINE BESYLATE 10 MG PO TABS: 10.0000 mg | ORAL_TABLET | Freq: Every day | ORAL | 0 refills | Status: DC

## 2019-03-08 MED ORDER — CLOPIDOGREL BISULFATE 75 MG PO TABS: 75.0000 mg | ORAL_TABLET | Freq: Every day | ORAL | 0 refills | Status: DC

## 2019-05-31 ENCOUNTER — Other Ambulatory Visit: Payer: Self-pay | Admitting: Cardiology

## 2019-07-05 DIAGNOSIS — E1121 Type 2 diabetes mellitus with diabetic nephropathy: Secondary | ICD-10-CM | POA: Diagnosis not present

## 2019-07-05 DIAGNOSIS — E7849 Other hyperlipidemia: Secondary | ICD-10-CM | POA: Diagnosis not present

## 2019-07-05 DIAGNOSIS — Z0000 Encounter for general adult medical examination without abnormal findings: Secondary | ICD-10-CM | POA: Diagnosis not present

## 2019-07-05 DIAGNOSIS — I10 Essential (primary) hypertension: Secondary | ICD-10-CM | POA: Diagnosis not present

## 2019-07-05 DIAGNOSIS — M75111 Incomplete rotator cuff tear or rupture of right shoulder, not specified as traumatic: Secondary | ICD-10-CM | POA: Diagnosis not present

## 2019-07-05 DIAGNOSIS — Z1389 Encounter for screening for other disorder: Secondary | ICD-10-CM | POA: Diagnosis not present

## 2019-07-08 DIAGNOSIS — E7849 Other hyperlipidemia: Secondary | ICD-10-CM | POA: Diagnosis not present

## 2019-07-08 DIAGNOSIS — I10 Essential (primary) hypertension: Secondary | ICD-10-CM | POA: Diagnosis not present

## 2019-07-08 DIAGNOSIS — E1121 Type 2 diabetes mellitus with diabetic nephropathy: Secondary | ICD-10-CM | POA: Diagnosis not present

## 2019-07-12 DIAGNOSIS — H353113 Nonexudative age-related macular degeneration, right eye, advanced atrophic without subfoveal involvement: Secondary | ICD-10-CM | POA: Diagnosis not present

## 2019-07-12 DIAGNOSIS — H43813 Vitreous degeneration, bilateral: Secondary | ICD-10-CM | POA: Diagnosis not present

## 2019-07-12 DIAGNOSIS — E113491 Type 2 diabetes mellitus with severe nonproliferative diabetic retinopathy without macular edema, right eye: Secondary | ICD-10-CM | POA: Diagnosis not present

## 2019-07-12 DIAGNOSIS — H353124 Nonexudative age-related macular degeneration, left eye, advanced atrophic with subfoveal involvement: Secondary | ICD-10-CM | POA: Diagnosis not present

## 2019-09-27 DIAGNOSIS — Z23 Encounter for immunization: Secondary | ICD-10-CM | POA: Diagnosis not present

## 2019-10-04 DIAGNOSIS — E7849 Other hyperlipidemia: Secondary | ICD-10-CM | POA: Diagnosis not present

## 2019-10-04 DIAGNOSIS — E1121 Type 2 diabetes mellitus with diabetic nephropathy: Secondary | ICD-10-CM | POA: Diagnosis not present

## 2019-10-04 DIAGNOSIS — F5103 Paradoxical insomnia: Secondary | ICD-10-CM | POA: Diagnosis not present

## 2019-10-04 DIAGNOSIS — I10 Essential (primary) hypertension: Secondary | ICD-10-CM | POA: Diagnosis not present

## 2019-10-25 DIAGNOSIS — Z23 Encounter for immunization: Secondary | ICD-10-CM | POA: Diagnosis not present

## 2019-11-24 DIAGNOSIS — J9600 Acute respiratory failure, unspecified whether with hypoxia or hypercapnia: Secondary | ICD-10-CM | POA: Diagnosis not present

## 2019-11-24 DIAGNOSIS — N179 Acute kidney failure, unspecified: Secondary | ICD-10-CM | POA: Diagnosis present

## 2019-11-24 DIAGNOSIS — Z96641 Presence of right artificial hip joint: Secondary | ICD-10-CM | POA: Diagnosis not present

## 2019-11-24 DIAGNOSIS — M79651 Pain in right thigh: Secondary | ICD-10-CM | POA: Diagnosis not present

## 2019-11-24 DIAGNOSIS — L03115 Cellulitis of right lower limb: Secondary | ICD-10-CM | POA: Diagnosis not present

## 2019-11-24 DIAGNOSIS — I110 Hypertensive heart disease with heart failure: Secondary | ICD-10-CM | POA: Diagnosis present

## 2019-11-24 DIAGNOSIS — I2510 Atherosclerotic heart disease of native coronary artery without angina pectoris: Secondary | ICD-10-CM | POA: Diagnosis not present

## 2019-11-24 DIAGNOSIS — B9561 Methicillin susceptible Staphylococcus aureus infection as the cause of diseases classified elsewhere: Secondary | ICD-10-CM | POA: Diagnosis present

## 2019-11-24 DIAGNOSIS — Z7982 Long term (current) use of aspirin: Secondary | ICD-10-CM | POA: Diagnosis not present

## 2019-11-24 DIAGNOSIS — R0902 Hypoxemia: Secondary | ICD-10-CM | POA: Diagnosis not present

## 2019-11-24 DIAGNOSIS — Z471 Aftercare following joint replacement surgery: Secondary | ICD-10-CM | POA: Diagnosis not present

## 2019-11-24 DIAGNOSIS — Z7902 Long term (current) use of antithrombotics/antiplatelets: Secondary | ICD-10-CM | POA: Diagnosis not present

## 2019-11-24 DIAGNOSIS — E1151 Type 2 diabetes mellitus with diabetic peripheral angiopathy without gangrene: Secondary | ICD-10-CM | POA: Diagnosis present

## 2019-11-24 DIAGNOSIS — Z794 Long term (current) use of insulin: Secondary | ICD-10-CM | POA: Diagnosis not present

## 2019-11-24 DIAGNOSIS — R1032 Left lower quadrant pain: Secondary | ICD-10-CM | POA: Diagnosis not present

## 2019-11-24 DIAGNOSIS — I5031 Acute diastolic (congestive) heart failure: Secondary | ICD-10-CM | POA: Diagnosis not present

## 2019-11-24 DIAGNOSIS — R41841 Cognitive communication deficit: Secondary | ICD-10-CM | POA: Diagnosis not present

## 2019-11-24 DIAGNOSIS — R0602 Shortness of breath: Secondary | ICD-10-CM | POA: Diagnosis not present

## 2019-11-24 DIAGNOSIS — E871 Hypo-osmolality and hyponatremia: Secondary | ICD-10-CM | POA: Diagnosis present

## 2019-11-24 DIAGNOSIS — M7989 Other specified soft tissue disorders: Secondary | ICD-10-CM | POA: Diagnosis not present

## 2019-11-24 DIAGNOSIS — R2689 Other abnormalities of gait and mobility: Secondary | ICD-10-CM | POA: Diagnosis not present

## 2019-11-24 DIAGNOSIS — L02415 Cutaneous abscess of right lower limb: Secondary | ICD-10-CM | POA: Diagnosis not present

## 2019-11-24 DIAGNOSIS — Z87891 Personal history of nicotine dependence: Secondary | ICD-10-CM | POA: Diagnosis not present

## 2019-11-24 DIAGNOSIS — F339 Major depressive disorder, recurrent, unspecified: Secondary | ICD-10-CM | POA: Diagnosis not present

## 2019-11-24 DIAGNOSIS — M79661 Pain in right lower leg: Secondary | ICD-10-CM | POA: Diagnosis not present

## 2019-11-24 DIAGNOSIS — M6281 Muscle weakness (generalized): Secondary | ICD-10-CM | POA: Diagnosis not present

## 2019-11-24 DIAGNOSIS — E785 Hyperlipidemia, unspecified: Secondary | ICD-10-CM | POA: Diagnosis present

## 2019-11-24 DIAGNOSIS — M79604 Pain in right leg: Secondary | ICD-10-CM | POA: Diagnosis not present

## 2019-11-24 DIAGNOSIS — E11628 Type 2 diabetes mellitus with other skin complications: Secondary | ICD-10-CM | POA: Diagnosis not present

## 2019-11-24 DIAGNOSIS — T45525D Adverse effect of antithrombotic drugs, subsequent encounter: Secondary | ICD-10-CM | POA: Diagnosis not present

## 2019-11-24 DIAGNOSIS — Z20822 Contact with and (suspected) exposure to covid-19: Secondary | ICD-10-CM | POA: Diagnosis not present

## 2019-11-24 DIAGNOSIS — K219 Gastro-esophageal reflux disease without esophagitis: Secondary | ICD-10-CM | POA: Diagnosis present

## 2019-11-24 DIAGNOSIS — D62 Acute posthemorrhagic anemia: Secondary | ICD-10-CM | POA: Diagnosis not present

## 2019-11-24 DIAGNOSIS — K529 Noninfective gastroenteritis and colitis, unspecified: Secondary | ICD-10-CM | POA: Diagnosis not present

## 2019-12-01 DIAGNOSIS — R41841 Cognitive communication deficit: Secondary | ICD-10-CM | POA: Diagnosis not present

## 2019-12-01 DIAGNOSIS — N179 Acute kidney failure, unspecified: Secondary | ICD-10-CM | POA: Diagnosis not present

## 2019-12-01 DIAGNOSIS — B9561 Methicillin susceptible Staphylococcus aureus infection as the cause of diseases classified elsewhere: Secondary | ICD-10-CM | POA: Diagnosis not present

## 2019-12-01 DIAGNOSIS — S37099A Other injury of unspecified kidney, initial encounter: Secondary | ICD-10-CM | POA: Diagnosis not present

## 2019-12-01 DIAGNOSIS — L02415 Cutaneous abscess of right lower limb: Secondary | ICD-10-CM | POA: Diagnosis not present

## 2019-12-01 DIAGNOSIS — I2510 Atherosclerotic heart disease of native coronary artery without angina pectoris: Secondary | ICD-10-CM | POA: Diagnosis not present

## 2019-12-01 DIAGNOSIS — I5031 Acute diastolic (congestive) heart failure: Secondary | ICD-10-CM | POA: Diagnosis not present

## 2019-12-01 DIAGNOSIS — R2689 Other abnormalities of gait and mobility: Secondary | ICD-10-CM | POA: Diagnosis not present

## 2019-12-01 DIAGNOSIS — J9600 Acute respiratory failure, unspecified whether with hypoxia or hypercapnia: Secondary | ICD-10-CM | POA: Diagnosis not present

## 2019-12-01 DIAGNOSIS — I110 Hypertensive heart disease with heart failure: Secondary | ICD-10-CM | POA: Diagnosis not present

## 2019-12-01 DIAGNOSIS — D62 Acute posthemorrhagic anemia: Secondary | ICD-10-CM | POA: Diagnosis not present

## 2019-12-01 DIAGNOSIS — E11628 Type 2 diabetes mellitus with other skin complications: Secondary | ICD-10-CM | POA: Diagnosis not present

## 2019-12-01 DIAGNOSIS — T45525D Adverse effect of antithrombotic drugs, subsequent encounter: Secondary | ICD-10-CM | POA: Diagnosis not present

## 2019-12-01 DIAGNOSIS — M6281 Muscle weakness (generalized): Secondary | ICD-10-CM | POA: Diagnosis not present

## 2019-12-01 DIAGNOSIS — M65051 Abscess of tendon sheath, right thigh: Secondary | ICD-10-CM | POA: Diagnosis not present

## 2019-12-01 DIAGNOSIS — E871 Hypo-osmolality and hyponatremia: Secondary | ICD-10-CM | POA: Diagnosis not present

## 2019-12-01 DIAGNOSIS — E1151 Type 2 diabetes mellitus with diabetic peripheral angiopathy without gangrene: Secondary | ICD-10-CM | POA: Diagnosis not present

## 2019-12-01 DIAGNOSIS — F339 Major depressive disorder, recurrent, unspecified: Secondary | ICD-10-CM | POA: Diagnosis not present

## 2019-12-02 DIAGNOSIS — M65051 Abscess of tendon sheath, right thigh: Secondary | ICD-10-CM | POA: Diagnosis not present

## 2019-12-02 DIAGNOSIS — S37099A Other injury of unspecified kidney, initial encounter: Secondary | ICD-10-CM | POA: Diagnosis not present

## 2019-12-02 DIAGNOSIS — E871 Hypo-osmolality and hyponatremia: Secondary | ICD-10-CM | POA: Diagnosis not present

## 2020-01-01 DIAGNOSIS — E119 Type 2 diabetes mellitus without complications: Secondary | ICD-10-CM | POA: Diagnosis not present

## 2020-01-01 DIAGNOSIS — I5022 Chronic systolic (congestive) heart failure: Secondary | ICD-10-CM | POA: Diagnosis not present

## 2020-01-01 DIAGNOSIS — I10 Essential (primary) hypertension: Secondary | ICD-10-CM | POA: Diagnosis not present

## 2020-01-10 ENCOUNTER — Other Ambulatory Visit: Payer: Self-pay

## 2020-01-10 ENCOUNTER — Ambulatory Visit (INDEPENDENT_AMBULATORY_CARE_PROVIDER_SITE_OTHER): Payer: Medicare Other | Admitting: Ophthalmology

## 2020-01-10 ENCOUNTER — Encounter (INDEPENDENT_AMBULATORY_CARE_PROVIDER_SITE_OTHER): Payer: Self-pay | Admitting: Ophthalmology

## 2020-01-10 DIAGNOSIS — E113491 Type 2 diabetes mellitus with severe nonproliferative diabetic retinopathy without macular edema, right eye: Secondary | ICD-10-CM | POA: Insufficient documentation

## 2020-01-10 DIAGNOSIS — H353113 Nonexudative age-related macular degeneration, right eye, advanced atrophic without subfoveal involvement: Secondary | ICD-10-CM

## 2020-01-10 DIAGNOSIS — E113492 Type 2 diabetes mellitus with severe nonproliferative diabetic retinopathy without macular edema, left eye: Secondary | ICD-10-CM

## 2020-01-10 NOTE — Assessment & Plan Note (Signed)
Schedule completion of PRP OD in the near future protect the acuity and prevent disease progression

## 2020-01-10 NOTE — Progress Notes (Signed)
01/10/2020     CHIEF COMPLAINT Patient presents for Retina Follow Up   HISTORY OF PRESENT ILLNESS: Cynthia Ray is a 86 y.o. female who presents to the clinic today for:   HPI    Retina Follow Up    Patient presents with  Diabetic Retinopathy.  In both eyes.  Duration of 6 months.  Since onset it is stable.          Comments    6 month follow up - FP OU Patient denies change in vision and overall has no complaints. LBS & A1C unknown        Last edited by Gerda Diss on 01/10/2020  1:44 PM. (History)      Referring physician: Neale Burly, MD [place],  [place] [address]  HISTORICAL INFORMATION:   Selected notes from the MEDICAL RECORD NUMBER    Lab Results  Component Value Date   HGBA1C 7.8 (H) 04/30/2018     CURRENT MEDICATIONS: No current outpatient medications on file. (Ophthalmic Drugs)   No current facility-administered medications for this visit. (Ophthalmic Drugs)   Current Outpatient Medications (Other)  Medication Sig  . acetaminophen (TYLENOL) 325 MG tablet Take 1-2 tablets (325-650 mg total) by mouth every 4 (four) hours as needed for mild pain (or temp >/= 101 F).  Marland Kitchen amLODipine (NORVASC) 10 MG tablet TAKE ONE TABLET BY MOUTH DAILY  . aspirin 81 MG chewable tablet Chew 81 mg by mouth daily.   Marland Kitchen atorvastatin (LIPITOR) 80 MG tablet Take 1 tablet (80 mg total) by mouth daily.  . calcium carbonate (TUMS - DOSED IN MG ELEMENTAL CALCIUM) 500 MG chewable tablet Chew 1 tablet by mouth daily.  . cephALEXin (KEFLEX) 500 MG capsule Take 1 capsule (500 mg total) by mouth 3 (three) times daily.  . cholecalciferol (VITAMIN D) 1000 UNITS tablet Take 1,000 Units by mouth daily.  . clopidogrel (PLAVIX) 75 MG tablet TAKE ONE TABLET BY MOUTH DAILY  . docusate sodium (COLACE) 100 MG capsule Take 1 capsule (100 mg total) by mouth daily.  . furosemide (LASIX) 20 MG tablet Take 1 tablet (20 mg total) by mouth daily.  Marland Kitchen glipiZIDE (GLUCOTROL) 10 MG  tablet Take 1 tablet (10 mg total) by mouth 2 (two) times daily before a meal.  . iron polysaccharides (NIFEREX) 150 MG capsule Take 1 capsule (150 mg total) by mouth daily.  Marland Kitchen lisinopril (PRINIVIL,ZESTRIL) 40 MG tablet Take 1 tablet (40 mg total) by mouth daily.  . magnesium oxide (MAG-OX) 400 MG tablet Take 1 tablet (400 mg total) by mouth daily.  . metoprolol tartrate (LOPRESSOR) 25 MG tablet Take 1 tablet (25 mg total) by mouth 2 (two) times daily.  . nitroGLYCERIN (NITROSTAT) 0.4 MG SL tablet Place 0.4 mg under the tongue every 5 (five) minutes as needed for chest pain.  . pantoprazole (PROTONIX) 40 MG tablet Take 1 tablet (40 mg total) by mouth daily at 10 pm.  . traMADol (ULTRAM) 50 MG tablet Take 1 tablet (50 mg total) by mouth 2 (two) times daily as needed for moderate pain.  . vitamin C (ASCORBIC ACID) 500 MG tablet Take 1 tablet (500 mg total) by mouth daily.   No current facility-administered medications for this visit. (Other)      REVIEW OF SYSTEMS:    ALLERGIES Allergies  Allergen Reactions  . Ibuprofen Other (See Comments)    Dr. Britta Mccreedy informed patient not to take this due to ulcer  PAST MEDICAL HISTORY Past Medical History:  Diagnosis Date  . Anemia   . Arthritis    "dr said I have it in my back" [place]   . Carotid artery occlusion   . CHF (congestive heart failure) ([place]) 12/2017  . COPD (chronic obstructive pulmonary disease) ([place])   . Fractured hip ([place])    Fell and hurt R hip  . GERD (gastroesophageal reflux disease)   . History of blood transfusion 11/11/11  . Hyperlipidemia   . Hypertension   . PVD (peripheral vascular disease) ([place])    Stents left [facility] 2004 Dr. Albertine Patricia  . Type II diabetes mellitus ([place])    Past Surgical History:  Procedure Laterality Date  . ABDOMINAL AORTAGRAM N/A 10/31/2011   Procedure: ABDOMINAL Maxcine Ham;  Surgeon: Elam Dutch, MD;  Location: [facility] CATH LAB;  Service: Cardiovascular;  Laterality: N/A;  . ABDOMINAL AORTOGRAM  W/LOWER EXTREMITY N/A 04/16/2018   Procedure: ABDOMINAL AORTOGRAM W/LOWER EXTREMITY;  Surgeon: Elam Dutch, MD;  Location: [facility] CV LAB;  Service: Cardiovascular;  Laterality: N/A;  . AMPUTATION  03/23/2012   Procedure: AMPUTATION DIGIT;  Surgeon: Elam Dutch, MD;  Location: [facility] OR;  Service: Vascular;  Laterality: Right;  Right Femoral endarterectomy with profundaplasty; right femoral-popliteal bypass with nonreversed saphenous vein; and right first toe amputation  . CATARACT EXTRACTION W/ INTRAOCULAR LENS  IMPLANT, BILATERAL  2002  . COLONOSCOPY    . CORONARY ANGIOPLASTY WITH STENT PLACEMENT  01/19/12   "1; first one I've ever had"  . ENDARTERECTOMY FEMORAL Left 05/03/2018   Procedure: LEFT FEMORAL ENDARTERECTOMY WITH PROFUNDAPLASTY;  Surgeon: Elam Dutch, MD;  Location: [facility];  Service: Vascular;  Laterality: Left;  . EYE SURGERY  2009   Laser bilateral   . FEMORAL ENDARTERECTOMY  03/23/2012   right  . FEMORAL-POPLITEAL BYPASS GRAFT  03/23/2012   Procedure: BYPASS GRAFT FEMORAL-POPLITEAL ARTERY;  Surgeon: Elam Dutch, MD;  Location: [facility] OR;  Service: Vascular;  Laterality: Right;  Right Femoral endarterectomy with profundaplasty; right femoral-popliteal bypass with nonreversed saphenous vein; and right first toe amputation  . FEMORAL-TIBIAL BYPASS GRAFT Left 05/03/2018   Procedure: LEFT  FEMORAL TO ANTERIOR TIBIAL ARTERY BYPASS WITH COMPOSITE PTFE GRAFT AND REVERSED LEFT SAPHENOUS VEIN GRAFT.;  Surgeon: Elam Dutch, MD;  Location: [facility];  Service: Vascular;  Laterality: Left;  . HIP FRACTURE SURGERY Right 11/2017  . PATCH ANGIOPLASTY Left 05/03/2018   Procedure: PATCH ANGIOPLASTY LEFT FEMORAL ARTERY;  Surgeon: Elam Dutch, MD;  Location: [facility];  Service: Vascular;  Laterality: Left;  . PERCUTANEOUS CORONARY STENT INTERVENTION (PCI-S) N/A 01/19/2012   Procedure: PERCUTANEOUS CORONARY STENT INTERVENTION (PCI-S);  Surgeon: Peter M Martinique, MD;  Location: [facility] CATH  LAB;  Service: Cardiovascular;  Laterality: N/A;  . PERIPHERAL ARTERIAL STENT GRAFT  2003   LLE  . POSTERIOR LAMINECTOMY / DECOMPRESSION LUMBAR SPINE  1989  . REFRACTIVE SURGERY  2009   bilaterally  . TONSILLECTOMY AND ADENOIDECTOMY  1946    FAMILY HISTORY Family History  Problem Relation Age of Onset  . Stroke Father 36  . Hypertension Father   . Stroke Mother 53  . Diabetes Mother   . Hypertension Mother   . Diabetes Sister   . Hyperlipidemia Sister   . Hypertension Sister   . Diabetes Brother   . Hypertension Brother     SOCIAL HISTORY Social History   Tobacco Use  . Smoking status: Former Smoker    Packs/day: 1.00    Years:  24.00    Pack years: 24.00    Types: Cigarettes    Start date: 07/29/1963    Quit date: 07/29/1987    Years since quitting: 32.4  . Smokeless tobacco: Never Used  Vaping Use  . Vaping Use: Never used  Substance Use Topics  . Alcohol use: No    Alcohol/week: 0.0 standard drinks  . Drug use: No         OPHTHALMIC EXAM:  Base Eye Exam    Visual Acuity (Snellen - Linear)      Right Left   Dist [place] 20/60-2 CF @ 4'   Dist ph [place] NI NI       Tonometry (Tonopen, 1:49 PM)      Right Left   Pressure 17 15       Pupils      Pupils Dark Light Shape React APD   Right PERRL 2 2 Round Minimal None   Left PERRL 2 2 Round Minimal None       Visual Fields (Counting fingers)      Left Right    Full Full       Extraocular Movement      Right Left    Full Full       Neuro/Psych    Oriented x3: Yes   Mood/Affect: Normal       Dilation    Both eyes: 1.0% Mydriacyl, 2.5% Phenylephrine @ 1:49 PM        Slit Lamp and Fundus Exam    External Exam      Right Left   External Normal Normal       Slit Lamp Exam      Right Left   Lids/Lashes Normal Normal   Conjunctiva/Sclera White and quiet White and quiet   Cornea Clear Clear   Anterior Chamber Deep and quiet Deep and quiet   Iris Round and reactive Round and reactive   Lens  Posterior chamber intraocular lens Posterior chamber intraocular lens   Anterior Vitreous Normal Normal       Fundus Exam      Right Left   Posterior Vitreous Normal Normal   Disc Normal Normal   C/D Ratio 0.35 0.35   Macula no macular thickening, Microaneurysms, no clinically significant macular edema no macular thickening, Microaneurysms, no clinically significant macular edema   Vessels NPDR-Severe NPDR-Severe   Periphery Good PRP temporal and inferiorly OD PRP limited, temporally only          IMAGING AND PROCEDURES  Imaging and Procedures for 01/10/20  Color Fundus Photography Optos - OU - Both Eyes       Right Eye Progression has been stable. Disc findings include normal observations. Macula : microaneurysms.   Left Eye Progression has been stable. Disc findings include normal observations. Macula : microaneurysms, normal observations.   Notes OD with clear media and severe NPDR and good PRP temporal inferiorly.  Good laser treatment is noted.  Large swath of nontreated peripheral retina superior nasal.  OS with limited visual potential because of the macular scarring, however only mild PRP is noted for severe NPDR.                ASSESSMENT/PLAN:  Severe nonproliferative diabetic retinopathy of right eye (HCC) Schedule completion of PRP OD in the near future protect the acuity and prevent disease progression      ICD-10-CM   1. Severe nonproliferative diabetic retinopathy of right eye associated with type 2 diabetes mellitus, macular edema  presence unspecified ([place])  K86.3817 Color Fundus Photography Optos - OU - Both Eyes  2. Severe nonproliferative diabetic retinopathy of left eye associated with type 2 diabetes mellitus, macular edema presence unspecified (HCC)  R11.6579 Color Fundus Photography Optos - OU - Both Eyes  3. Advanced nonexudative age-related macular degeneration of right eye without subfoveal involvement  H35.3113 Color Fundus Photography  Optos - OU - Both Eyes    1.OD with clear media and good PRP temporal inferiorly.  Good laser treatment is noted.  Large swath of nontreated peripheral retina superior nasal.  We will plan completion of PRP OD so that illness or lack of transportation will not need to lack of follow-up and loss of vision to diabetic retinopathy progression 2.   3.  Ophthalmic Meds Ordered this visit:  No orders of the defined types were placed in this encounter.      Return in about 2 weeks (around 01/24/2020) for dilate, OD, PRP.  There are no Patient Instructions on file for this visit.   Explained the diagnoses, plan, and follow up with the patient and they expressed understanding.  Patient expressed understanding of the importance of proper follow up care.   Clent Demark Addylin Manke M.D. Diseases & Surgery of the Retina and Vitreous Retina & Diabetic [place] 01/10/20     Abbreviations: M myopia (nearsighted); A astigmatism; H hyperopia (farsighted); P presbyopia; Mrx spectacle prescription;  CTL contact lenses; OD right eye; OS left eye; OU both eyes  XT exotropia; ET esotropia; PEK punctate epithelial keratitis; PEE punctate epithelial erosions; DES dry eye syndrome; MGD meibomian gland dysfunction; ATs artificial tears; PFAT's preservative free artificial tears; [place] nuclear sclerotic cataract; PSC posterior subcapsular cataract; ERM epi-retinal membrane; PVD posterior vitreous detachment; RD retinal detachment; DM diabetes mellitus; DR diabetic retinopathy; NPDR non-proliferative diabetic retinopathy; PDR proliferative diabetic retinopathy; CSME clinically significant macular edema; DME diabetic macular edema; dbh dot blot hemorrhages; CWS cotton wool spot; POAG primary open angle glaucoma; C/D cup-to-disc ratio; HVF humphrey visual field; GVF goldmann visual field; OCT optical coherence tomography; IOP intraocular pressure; BRVO Branch retinal vein occlusion; CRVO central retinal vein occlusion; CRAO  central retinal artery occlusion; BRAO branch retinal artery occlusion; RT retinal tear; SB scleral buckle; PPV pars plana vitrectomy; VH Vitreous hemorrhage; PRP panretinal laser photocoagulation; IVK intravitreal kenalog; VMT vitreomacular traction; MH Macular hole;  NVD neovascularization of the disc; NVE neovascularization elsewhere; AREDS age related eye disease study; ARMD age related macular degeneration; POAG primary open angle glaucoma; EBMD epithelial/anterior basement membrane dystrophy; ACIOL anterior chamber intraocular lens; IOL intraocular lens; PCIOL posterior chamber intraocular lens; Phaco/IOL phacoemulsification with intraocular lens placement; [place] photorefractive keratectomy; LASIK laser assisted in situ keratomileusis; HTN hypertension; DM diabetes mellitus; COPD chronic obstructive pulmonary disease

## 2020-01-25 ENCOUNTER — Encounter: Payer: Self-pay | Admitting: Ophthalmology

## 2020-01-25 DIAGNOSIS — H353124 Nonexudative age-related macular degeneration, left eye, advanced atrophic with subfoveal involvement: Secondary | ICD-10-CM | POA: Diagnosis not present

## 2020-01-25 DIAGNOSIS — H353112 Nonexudative age-related macular degeneration, right eye, intermediate dry stage: Secondary | ICD-10-CM | POA: Diagnosis not present

## 2020-01-25 DIAGNOSIS — Z794 Long term (current) use of insulin: Secondary | ICD-10-CM | POA: Diagnosis not present

## 2020-01-25 DIAGNOSIS — Z961 Presence of intraocular lens: Secondary | ICD-10-CM | POA: Diagnosis not present

## 2020-01-25 DIAGNOSIS — Z7984 Long term (current) use of oral hypoglycemic drugs: Secondary | ICD-10-CM | POA: Diagnosis not present

## 2020-01-25 DIAGNOSIS — H524 Presbyopia: Secondary | ICD-10-CM | POA: Diagnosis not present

## 2020-01-25 DIAGNOSIS — E119 Type 2 diabetes mellitus without complications: Secondary | ICD-10-CM | POA: Diagnosis not present

## 2020-01-25 LAB — HM DIABETES EYE EXAM

## 2020-01-26 ENCOUNTER — Encounter (INDEPENDENT_AMBULATORY_CARE_PROVIDER_SITE_OTHER): Payer: Medicare Other | Admitting: Ophthalmology

## 2020-01-26 DIAGNOSIS — F339 Major depressive disorder, recurrent, unspecified: Secondary | ICD-10-CM | POA: Diagnosis not present

## 2020-01-26 DIAGNOSIS — I959 Hypotension, unspecified: Secondary | ICD-10-CM | POA: Diagnosis not present

## 2020-01-26 DIAGNOSIS — L02415 Cutaneous abscess of right lower limb: Secondary | ICD-10-CM | POA: Diagnosis not present

## 2020-01-26 DIAGNOSIS — I5022 Chronic systolic (congestive) heart failure: Secondary | ICD-10-CM | POA: Diagnosis not present

## 2020-01-26 DIAGNOSIS — E871 Hypo-osmolality and hyponatremia: Secondary | ICD-10-CM | POA: Diagnosis not present

## 2020-01-26 DIAGNOSIS — I5031 Acute diastolic (congestive) heart failure: Secondary | ICD-10-CM | POA: Diagnosis not present

## 2020-01-26 DIAGNOSIS — E876 Hypokalemia: Secondary | ICD-10-CM | POA: Diagnosis not present

## 2020-01-26 DIAGNOSIS — R630 Anorexia: Secondary | ICD-10-CM | POA: Diagnosis not present

## 2020-01-26 DIAGNOSIS — B9561 Methicillin susceptible Staphylococcus aureus infection as the cause of diseases classified elsewhere: Secondary | ICD-10-CM | POA: Diagnosis not present

## 2020-01-26 DIAGNOSIS — Z20822 Contact with and (suspected) exposure to covid-19: Secondary | ICD-10-CM | POA: Diagnosis not present

## 2020-01-26 DIAGNOSIS — I10 Essential (primary) hypertension: Secondary | ICD-10-CM | POA: Diagnosis not present

## 2020-01-26 DIAGNOSIS — R41841 Cognitive communication deficit: Secondary | ICD-10-CM | POA: Diagnosis not present

## 2020-01-26 DIAGNOSIS — I509 Heart failure, unspecified: Secondary | ICD-10-CM | POA: Diagnosis not present

## 2020-01-26 DIAGNOSIS — R197 Diarrhea, unspecified: Secondary | ICD-10-CM | POA: Diagnosis not present

## 2020-01-26 DIAGNOSIS — R627 Adult failure to thrive: Secondary | ICD-10-CM | POA: Diagnosis not present

## 2020-01-26 DIAGNOSIS — K921 Melena: Secondary | ICD-10-CM | POA: Diagnosis not present

## 2020-01-26 DIAGNOSIS — I2510 Atherosclerotic heart disease of native coronary artery without angina pectoris: Secondary | ICD-10-CM | POA: Diagnosis not present

## 2020-01-26 DIAGNOSIS — E872 Acidosis: Secondary | ICD-10-CM | POA: Diagnosis not present

## 2020-01-26 DIAGNOSIS — K5730 Diverticulosis of large intestine without perforation or abscess without bleeding: Secondary | ICD-10-CM | POA: Diagnosis not present

## 2020-01-26 DIAGNOSIS — I700 Atherosclerosis of aorta: Secondary | ICD-10-CM | POA: Diagnosis not present

## 2020-01-26 DIAGNOSIS — D649 Anemia, unspecified: Secondary | ICD-10-CM | POA: Diagnosis not present

## 2020-01-26 DIAGNOSIS — R404 Transient alteration of awareness: Secondary | ICD-10-CM | POA: Diagnosis not present

## 2020-01-26 DIAGNOSIS — R531 Weakness: Secondary | ICD-10-CM | POA: Diagnosis not present

## 2020-01-26 DIAGNOSIS — D72829 Elevated white blood cell count, unspecified: Secondary | ICD-10-CM | POA: Diagnosis not present

## 2020-01-26 DIAGNOSIS — K922 Gastrointestinal hemorrhage, unspecified: Secondary | ICD-10-CM | POA: Diagnosis not present

## 2020-01-26 DIAGNOSIS — N2889 Other specified disorders of kidney and ureter: Secondary | ICD-10-CM | POA: Diagnosis not present

## 2020-01-26 DIAGNOSIS — T45525D Adverse effect of antithrombotic drugs, subsequent encounter: Secondary | ICD-10-CM | POA: Diagnosis not present

## 2020-01-26 DIAGNOSIS — A09 Infectious gastroenteritis and colitis, unspecified: Secondary | ICD-10-CM | POA: Diagnosis not present

## 2020-01-26 DIAGNOSIS — E113491 Type 2 diabetes mellitus with severe nonproliferative diabetic retinopathy without macular edema, right eye: Secondary | ICD-10-CM | POA: Diagnosis not present

## 2020-01-26 DIAGNOSIS — E119 Type 2 diabetes mellitus without complications: Secondary | ICD-10-CM | POA: Diagnosis not present

## 2020-01-26 DIAGNOSIS — M6281 Muscle weakness (generalized): Secondary | ICD-10-CM | POA: Diagnosis not present

## 2020-01-26 DIAGNOSIS — R2689 Other abnormalities of gait and mobility: Secondary | ICD-10-CM | POA: Diagnosis not present

## 2020-01-26 DIAGNOSIS — J9600 Acute respiratory failure, unspecified whether with hypoxia or hypercapnia: Secondary | ICD-10-CM | POA: Diagnosis not present

## 2020-01-26 DIAGNOSIS — N390 Urinary tract infection, site not specified: Secondary | ICD-10-CM | POA: Diagnosis not present

## 2020-01-26 DIAGNOSIS — I110 Hypertensive heart disease with heart failure: Secondary | ICD-10-CM | POA: Diagnosis not present

## 2020-01-26 DIAGNOSIS — K6389 Other specified diseases of intestine: Secondary | ICD-10-CM | POA: Diagnosis not present

## 2020-01-26 DIAGNOSIS — D62 Acute posthemorrhagic anemia: Secondary | ICD-10-CM | POA: Diagnosis not present

## 2020-01-26 DIAGNOSIS — R4182 Altered mental status, unspecified: Secondary | ICD-10-CM | POA: Diagnosis not present

## 2020-01-26 DIAGNOSIS — K529 Noninfective gastroenteritis and colitis, unspecified: Secondary | ICD-10-CM | POA: Diagnosis not present

## 2020-01-26 DIAGNOSIS — N179 Acute kidney failure, unspecified: Secondary | ICD-10-CM | POA: Diagnosis not present

## 2020-01-26 DIAGNOSIS — R109 Unspecified abdominal pain: Secondary | ICD-10-CM | POA: Diagnosis not present

## 2020-01-26 DIAGNOSIS — E1151 Type 2 diabetes mellitus with diabetic peripheral angiopathy without gangrene: Secondary | ICD-10-CM | POA: Diagnosis not present

## 2020-02-02 DIAGNOSIS — I10 Essential (primary) hypertension: Secondary | ICD-10-CM | POA: Diagnosis not present

## 2020-02-02 DIAGNOSIS — E119 Type 2 diabetes mellitus without complications: Secondary | ICD-10-CM | POA: Diagnosis not present

## 2020-02-02 DIAGNOSIS — I5022 Chronic systolic (congestive) heart failure: Secondary | ICD-10-CM | POA: Diagnosis not present

## 2020-02-11 IMAGING — DX DG CHEST 1V PORT
1 series · 1 of 1 positions shown · non-contrast
Comparison: 12/12/2017

CLINICAL DATA: Preop.  History of COPD, diabetes, hypertension

EXAM:
PORTABLE CHEST 1 VIEW

[chest]
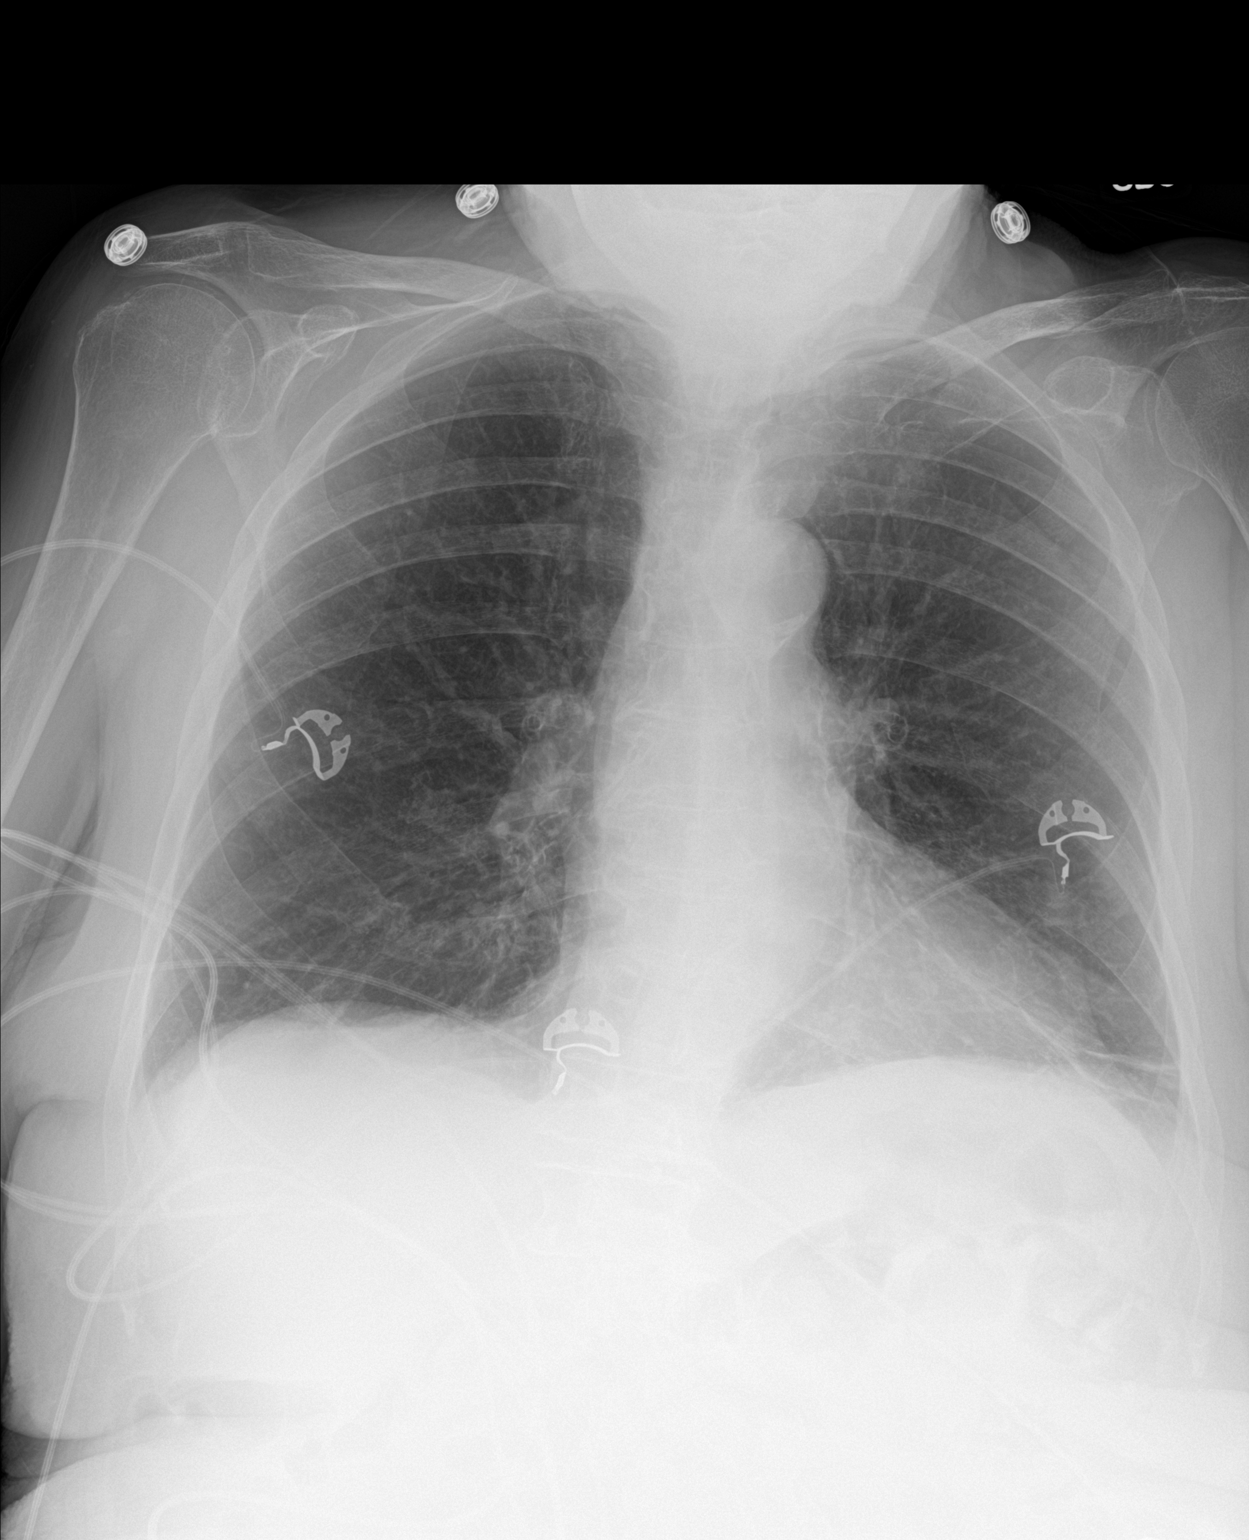

[1 of 1 positions shown; findings below may reference images not displayed]

FINDINGS: Linear atelectasis or scarring at the left base. Heart is upper
limits normal in size. Right lung clear. No effusions or acute bony
abnormality.
IMPRESSION: Linear left base atelectasis or scarring.

## 2020-02-15 ENCOUNTER — Other Ambulatory Visit: Payer: Self-pay

## 2020-02-15 ENCOUNTER — Ambulatory Visit (INDEPENDENT_AMBULATORY_CARE_PROVIDER_SITE_OTHER): Payer: Medicare Other | Admitting: Ophthalmology

## 2020-02-15 ENCOUNTER — Encounter (INDEPENDENT_AMBULATORY_CARE_PROVIDER_SITE_OTHER): Payer: Self-pay | Admitting: Ophthalmology

## 2020-02-15 DIAGNOSIS — E113491 Type 2 diabetes mellitus with severe nonproliferative diabetic retinopathy without macular edema, right eye: Secondary | ICD-10-CM

## 2020-02-15 DIAGNOSIS — E113492 Type 2 diabetes mellitus with severe nonproliferative diabetic retinopathy without macular edema, left eye: Secondary | ICD-10-CM

## 2020-02-15 NOTE — Progress Notes (Signed)
02/15/2020     CHIEF COMPLAINT Patient presents for Retina Follow Up   HISTORY OF PRESENT ILLNESS: Cynthia Ray is a 86 y.o. female who presents to the clinic today for:   HPI    Retina Follow Up    Patient presents with  Diabetic Retinopathy.  In right eye.  Duration of 5 weeks.  Since onset it is stable.          Comments    5 week f.u - Poss PRP OD Patient denies change in vision and overall has no complaints.        Last edited by Gerda Diss on 02/15/2020  2:15 PM. (History)      Referring physician: Neale Burly, MD [place],  [place] [phone]  HISTORICAL INFORMATION:   Selected notes from the MEDICAL RECORD NUMBER    Lab Results  Component Value Date   HGBA1C 7.8 (H) 04/30/2018     CURRENT MEDICATIONS: No current outpatient medications on file. (Ophthalmic Drugs)   No current facility-administered medications for this visit. (Ophthalmic Drugs)   Current Outpatient Medications (Other)  Medication Sig  . acetaminophen (TYLENOL) 325 MG tablet Take 1-2 tablets (325-650 mg total) by mouth every 4 (four) hours as needed for mild pain (or temp >/= 101 F).  Marland Kitchen amLODipine (NORVASC) 10 MG tablet TAKE ONE TABLET BY MOUTH DAILY  . aspirin 81 MG chewable tablet Chew 81 mg by mouth daily.   Marland Kitchen atorvastatin (LIPITOR) 80 MG tablet Take 1 tablet (80 mg total) by mouth daily.  . calcium carbonate (TUMS - DOSED IN MG ELEMENTAL CALCIUM) 500 MG chewable tablet Chew 1 tablet by mouth daily.  . cephALEXin (KEFLEX) 500 MG capsule Take 1 capsule (500 mg total) by mouth 3 (three) times daily.  . cholecalciferol (VITAMIN D) 1000 UNITS tablet Take 1,000 Units by mouth daily.  . clopidogrel (PLAVIX) 75 MG tablet TAKE ONE TABLET BY MOUTH DAILY  . docusate sodium (COLACE) 100 MG capsule Take 1 capsule (100 mg total) by mouth daily.  . furosemide (LASIX) 20 MG tablet Take 1 tablet (20 mg total) by mouth daily.  Marland Kitchen glipiZIDE (GLUCOTROL) 10 MG tablet Take 1 tablet (10  mg total) by mouth 2 (two) times daily before a meal.  . iron polysaccharides (NIFEREX) 150 MG capsule Take 1 capsule (150 mg total) by mouth daily.  Marland Kitchen lisinopril (PRINIVIL,ZESTRIL) 40 MG tablet Take 1 tablet (40 mg total) by mouth daily.  . magnesium oxide (MAG-OX) 400 MG tablet Take 1 tablet (400 mg total) by mouth daily.  . metoprolol tartrate (LOPRESSOR) 25 MG tablet Take 1 tablet (25 mg total) by mouth 2 (two) times daily.  . nitroGLYCERIN (NITROSTAT) 0.4 MG SL tablet Place 0.4 mg under the tongue every 5 (five) minutes as needed for chest pain.  . pantoprazole (PROTONIX) 40 MG tablet Take 1 tablet (40 mg total) by mouth daily at 10 pm.  . traMADol (ULTRAM) 50 MG tablet Take 1 tablet (50 mg total) by mouth 2 (two) times daily as needed for moderate pain.  . vitamin C (ASCORBIC ACID) 500 MG tablet Take 1 tablet (500 mg total) by mouth daily.   No current facility-administered medications for this visit. (Other)      REVIEW OF SYSTEMS:    ALLERGIES Allergies  Allergen Reactions  . Ibuprofen Other (See Comments)    Dr. Britta Mccreedy informed patient not to take this due to ulcer     PAST MEDICAL HISTORY Past  Medical History:  Diagnosis Date  . Anemia   . Arthritis    "dr said I have it in my back" [place]   . Carotid artery occlusion   . CHF (congestive heart failure) ([place]) 12/2017  . COPD (chronic obstructive pulmonary disease) ([place])   . Fractured hip ([place])    Fell and hurt R hip  . GERD (gastroesophageal reflux disease)   . History of blood transfusion 11/11/11  . Hyperlipidemia   . Hypertension   . PVD (peripheral vascular disease) ([place])    Stents left SFA 2004 Dr. Albertine Patricia  . Type II diabetes mellitus ([place])    Past Surgical History:  Procedure Laterality Date  . ABDOMINAL AORTAGRAM N/A 10/31/2011   Procedure: ABDOMINAL Maxcine Ham;  Surgeon: Elam Dutch, MD;  Location[facility] CATH LAB;  Service: Cardiovascular;  Laterality: N/A;  . ABDOMINAL AORTOGRAM W/LOWER EXTREMITY N/A  04/16/2018   Procedure: ABDOMINAL AORTOGRAM W/LOWER EXTREMITY;  Surgeon: Elam Dutch, MD;  Location: [place] CV LAB;  Service: Cardiovascular;  Laterality: N/A;  . AMPUTATION  03/23/2012   Procedure: AMPUTATION DIGIT;  Surgeon: Elam Dutch, MD;  Location: [facility] OR;  Service: Vascular;  Laterality: Right;  Right Femoral endarterectomy with profundaplasty; right femoral-popliteal bypass with nonreversed saphenous vein; and right first toe amputation  . CATARACT EXTRACTION W/ INTRAOCULAR LENS  IMPLANT, BILATERAL  2002  . COLONOSCOPY    . CORONARY ANGIOPLASTY WITH STENT PLACEMENT  01/19/12   "1; first one I've ever had"  . ENDARTERECTOMY FEMORAL Left 05/03/2018   Procedure: LEFT FEMORAL ENDARTERECTOMY WITH PROFUNDAPLASTY;  Surgeon: Elam Dutch, MD;  Location: [place];  Service: Vascular;  Laterality: Left;  . EYE SURGERY  2009   Laser bilateral   . FEMORAL ENDARTERECTOMY  03/23/2012   right  . FEMORAL-POPLITEAL BYPASS GRAFT  03/23/2012   Procedure: BYPASS GRAFT FEMORAL-POPLITEAL ARTERY;  Surgeon: Elam Dutch, MD;  Location: [facility] OR;  Service: Vascular;  Laterality: Right;  Right Femoral endarterectomy with profundaplasty; right femoral-popliteal bypass with nonreversed saphenous vein; and right first toe amputation  . FEMORAL-TIBIAL BYPASS GRAFT Left 05/03/2018   Procedure: LEFT  FEMORAL TO ANTERIOR TIBIAL ARTERY BYPASS WITH COMPOSITE PTFE GRAFT AND REVERSED LEFT SAPHENOUS VEIN GRAFT.;  Surgeon: Elam Dutch, MD;  Location: [place];  Service: Vascular;  Laterality: Left;  . HIP FRACTURE SURGERY Right 11/2017  . PATCH ANGIOPLASTY Left 05/03/2018   Procedure: PATCH ANGIOPLASTY LEFT FEMORAL ARTERY;  Surgeon: Elam Dutch, MD;  Location: [place];  Service: Vascular;  Laterality: Left;  . PERCUTANEOUS CORONARY STENT INTERVENTION (PCI-S) N/A 01/19/2012   Procedure: PERCUTANEOUS CORONARY STENT INTERVENTION (PCI-S);  Surgeon: Peter M Martinique, MD;  Location[facility] CATH LAB;  Service:  Cardiovascular;  Laterality: N/A;  . PERIPHERAL ARTERIAL STENT GRAFT  2003   LLE  . POSTERIOR LAMINECTOMY / DECOMPRESSION LUMBAR SPINE  1989  . REFRACTIVE SURGERY  2009   bilaterally  . TONSILLECTOMY AND ADENOIDECTOMY  1946    FAMILY HISTORY Family History  Problem Relation Age of Onset  . Stroke Father 43  . Hypertension Father   . Stroke Mother 10  . Diabetes Mother   . Hypertension Mother   . Diabetes Sister   . Hyperlipidemia Sister   . Hypertension Sister   . Diabetes Brother   . Hypertension Brother     SOCIAL HISTORY Social History   Tobacco Use  . Smoking status: Former Smoker    Packs/day: 1.00    Years: 24.00  Pack years: 24.00    Types: Cigarettes    Start date: 07/29/1963    Quit date: 07/29/1987    Years since quitting: 32.5  . Smokeless tobacco: Never Used  Vaping Use  . Vaping Use: Never used  Substance Use Topics  . Alcohol use: No    Alcohol/week: 0.0 standard drinks  . Drug use: No         OPHTHALMIC EXAM:  Base Eye Exam    Visual Acuity (Snellen - Linear)      Right Left   Dist [place] 20/100+1 CF @ 4'   Dist ph [place] 20/80-2 NI       Tonometry (Tonopen, 2:22 PM)      Right Left   Pressure 13 13       Pupils      Pupils Dark Light Shape React APD   Right PERRL 2 2 Round Minimal None   Left PERRL 2 2 Round Minimal None       Visual Fields (Counting fingers)      Left Right    Full Full       Extraocular Movement      Right Left    Full Full       Neuro/Psych    Oriented x3: Yes   Mood/Affect: Normal       Dilation    Right eye: 1.0% Mydriacyl, 2.5% Phenylephrine @ 2:22 PM        Slit Lamp and Fundus Exam    External Exam      Right Left   External Normal Normal       Slit Lamp Exam      Right Left   Lids/Lashes Normal Normal   Conjunctiva/Sclera White and quiet White and quiet   Cornea Clear Clear   Anterior Chamber Deep and quiet Deep and quiet   Iris Round and reactive Round and reactive   Lens Posterior  chamber intraocular lens Posterior chamber intraocular lens   Vitreous Normal Normal          IMAGING AND PROCEDURES  Imaging and Procedures for 02/15/20  Panretinal Photocoagulation - OD - Right Eye       Anesthesia Topical anesthesia was used. Anesthetic medications included Proparacaine 0.5%.   Laser Information The type of laser was diode. Color was yellow. The duration in seconds was 0.02. The spot size was 390 microns. Laser power was 300. Total spots was 1031.   Post-op The patient tolerated the procedure well. There were no complications. The patient received written and verbal post procedure care education.   Notes PRP applied superiorly, nasally and inferonasally.                ASSESSMENT/PLAN:  Severe nonproliferative diabetic retinopathy of left eye (HCC) OS now stable status post moderate to near complete PRP peripherally for severe NPDR.  Severe nonproliferative diabetic retinopathy of right eye (HCC) PRP OD completed, this will protect in this patient's lata years against the progression to proliferative diabetic retinopathy      ICD-10-CM   1. Severe nonproliferative diabetic retinopathy of right eye associated with type 2 diabetes mellitus, macular edema presence unspecified (HCC)  D74.1287 Panretinal Photocoagulation - OD - Right Eye  2. Severe nonproliferative diabetic retinopathy of left eye without macular edema associated with type 2 diabetes mellitus ([place])  O67.6720     1.  2.  3.  Ophthalmic Meds Ordered this visit:  No orders of the defined types were placed in this encounter.  Return in about 6 months (around 08/17/2020) for DILATE OU, COLOR FP.  There are no Patient Instructions on file for this visit.   Explained the diagnoses, plan, and follow up with the patient and they expressed understanding.  Patient expressed understanding of the importance of proper follow up care.   Clent Demark Keni Elison M.D. Diseases & Surgery of  the Retina and Vitreous Retina & Diabetic [place] 02/15/20     Abbreviations: M myopia (nearsighted); A astigmatism; H hyperopia (farsighted); P presbyopia; Mrx spectacle prescription;  CTL contact lenses; OD right eye; OS left eye; OU both eyes  XT exotropia; ET esotropia; PEK punctate epithelial keratitis; PEE punctate epithelial erosions; DES dry eye syndrome; MGD meibomian gland dysfunction; ATs artificial tears; PFAT's preservative free artificial tears; [place] nuclear sclerotic cataract; PSC posterior subcapsular cataract; ERM epi-retinal membrane; PVD posterior vitreous detachment; RD retinal detachment; DM diabetes mellitus; DR diabetic retinopathy; NPDR non-proliferative diabetic retinopathy; PDR proliferative diabetic retinopathy; CSME clinically significant macular edema; DME diabetic macular edema; dbh dot blot hemorrhages; CWS cotton wool spot; POAG primary open angle glaucoma; C/D cup-to-disc ratio; HVF humphrey visual field; GVF goldmann visual field; OCT optical coherence tomography; IOP intraocular pressure; BRVO Branch retinal vein occlusion; CRVO central retinal vein occlusion; CRAO central retinal artery occlusion; BRAO branch retinal artery occlusion; RT retinal tear; SB scleral buckle; PPV pars plana vitrectomy; VH Vitreous hemorrhage; PRP panretinal laser photocoagulation; IVK intravitreal kenalog; VMT vitreomacular traction; MH Macular hole;  NVD neovascularization of the disc; NVE neovascularization elsewhere; AREDS age related eye disease study; ARMD age related macular degeneration; POAG primary open angle glaucoma; EBMD epithelial/anterior basement membrane dystrophy; ACIOL anterior chamber intraocular lens; IOL intraocular lens; PCIOL posterior chamber intraocular lens; Phaco/IOL phacoemulsification with intraocular lens placement; [place] photorefractive keratectomy; LASIK laser assisted in situ keratomileusis; HTN hypertension; DM diabetes mellitus; COPD chronic obstructive  pulmonary disease

## 2020-02-15 NOTE — Assessment & Plan Note (Signed)
OS now stable status post moderate to near complete PRP peripherally for severe NPDR.

## 2020-02-15 NOTE — Assessment & Plan Note (Signed)
PRP OD completed, this will protect in this patient's lata years against the progression to proliferative diabetic retinopathy

## 2020-02-23 ENCOUNTER — Inpatient Hospital Stay
Admission: AD | Admit: 2020-02-23 | Payer: Medicare Other | Source: Other Acute Inpatient Hospital | Admitting: Pulmonary Disease

## 2020-02-23 DIAGNOSIS — K529 Noninfective gastroenteritis and colitis, unspecified: Secondary | ICD-10-CM | POA: Diagnosis not present

## 2020-02-23 DIAGNOSIS — E119 Type 2 diabetes mellitus without complications: Secondary | ICD-10-CM | POA: Diagnosis not present

## 2020-02-23 DIAGNOSIS — I509 Heart failure, unspecified: Secondary | ICD-10-CM | POA: Diagnosis not present

## 2020-02-23 DIAGNOSIS — R531 Weakness: Secondary | ICD-10-CM | POA: Diagnosis not present

## 2020-02-23 DIAGNOSIS — K6389 Other specified diseases of intestine: Secondary | ICD-10-CM | POA: Diagnosis not present

## 2020-02-23 DIAGNOSIS — N2889 Other specified disorders of kidney and ureter: Secondary | ICD-10-CM | POA: Diagnosis not present

## 2020-02-23 DIAGNOSIS — K5730 Diverticulosis of large intestine without perforation or abscess without bleeding: Secondary | ICD-10-CM | POA: Diagnosis not present

## 2020-02-23 DIAGNOSIS — K922 Gastrointestinal hemorrhage, unspecified: Secondary | ICD-10-CM | POA: Diagnosis not present

## 2020-02-23 DIAGNOSIS — N179 Acute kidney failure, unspecified: Secondary | ICD-10-CM | POA: Diagnosis not present

## 2020-02-23 DIAGNOSIS — I700 Atherosclerosis of aorta: Secondary | ICD-10-CM | POA: Diagnosis not present

## 2020-02-23 DIAGNOSIS — I110 Hypertensive heart disease with heart failure: Secondary | ICD-10-CM | POA: Diagnosis not present

## 2020-02-25 DIAGNOSIS — K529 Noninfective gastroenteritis and colitis, unspecified: Secondary | ICD-10-CM | POA: Diagnosis not present

## 2020-02-25 DIAGNOSIS — I110 Hypertensive heart disease with heart failure: Secondary | ICD-10-CM | POA: Diagnosis present

## 2020-02-25 DIAGNOSIS — K228 Other specified diseases of esophagus: Secondary | ICD-10-CM | POA: Diagnosis not present

## 2020-02-25 DIAGNOSIS — R1084 Generalized abdominal pain: Secondary | ICD-10-CM | POA: Diagnosis not present

## 2020-02-25 DIAGNOSIS — R1110 Vomiting, unspecified: Secondary | ICD-10-CM | POA: Diagnosis not present

## 2020-02-25 DIAGNOSIS — Z66 Do not resuscitate: Secondary | ICD-10-CM | POA: Diagnosis present

## 2020-02-25 DIAGNOSIS — K5792 Diverticulitis of intestine, part unspecified, without perforation or abscess without bleeding: Secondary | ICD-10-CM | POA: Diagnosis not present

## 2020-02-25 DIAGNOSIS — R2689 Other abnormalities of gait and mobility: Secondary | ICD-10-CM | POA: Diagnosis not present

## 2020-02-25 DIAGNOSIS — E1151 Type 2 diabetes mellitus with diabetic peripheral angiopathy without gangrene: Secondary | ICD-10-CM | POA: Diagnosis not present

## 2020-02-25 DIAGNOSIS — K6389 Other specified diseases of intestine: Secondary | ICD-10-CM | POA: Diagnosis not present

## 2020-02-25 DIAGNOSIS — N179 Acute kidney failure, unspecified: Secondary | ICD-10-CM | POA: Diagnosis present

## 2020-02-25 DIAGNOSIS — I5032 Chronic diastolic (congestive) heart failure: Secondary | ICD-10-CM | POA: Diagnosis present

## 2020-02-25 DIAGNOSIS — K922 Gastrointestinal hemorrhage, unspecified: Secondary | ICD-10-CM | POA: Diagnosis not present

## 2020-02-25 DIAGNOSIS — I5031 Acute diastolic (congestive) heart failure: Secondary | ICD-10-CM | POA: Diagnosis not present

## 2020-02-25 DIAGNOSIS — E860 Dehydration: Secondary | ICD-10-CM | POA: Diagnosis present

## 2020-02-25 DIAGNOSIS — R404 Transient alteration of awareness: Secondary | ICD-10-CM | POA: Diagnosis not present

## 2020-02-25 DIAGNOSIS — R1310 Dysphagia, unspecified: Secondary | ICD-10-CM | POA: Diagnosis present

## 2020-02-25 DIAGNOSIS — I509 Heart failure, unspecified: Secondary | ICD-10-CM | POA: Diagnosis not present

## 2020-02-25 DIAGNOSIS — E872 Acidosis: Secondary | ICD-10-CM | POA: Diagnosis not present

## 2020-02-25 DIAGNOSIS — Z743 Need for continuous supervision: Secondary | ICD-10-CM | POA: Diagnosis not present

## 2020-02-25 DIAGNOSIS — A040 Enteropathogenic Escherichia coli infection: Secondary | ICD-10-CM | POA: Diagnosis present

## 2020-02-25 DIAGNOSIS — Z794 Long term (current) use of insulin: Secondary | ICD-10-CM | POA: Diagnosis not present

## 2020-02-25 DIAGNOSIS — L89312 Pressure ulcer of right buttock, stage 2: Secondary | ICD-10-CM | POA: Diagnosis present

## 2020-02-25 DIAGNOSIS — R279 Unspecified lack of coordination: Secondary | ICD-10-CM | POA: Diagnosis not present

## 2020-02-25 DIAGNOSIS — R41841 Cognitive communication deficit: Secondary | ICD-10-CM | POA: Diagnosis not present

## 2020-02-25 DIAGNOSIS — Z20822 Contact with and (suspected) exposure to covid-19: Secondary | ICD-10-CM | POA: Diagnosis present

## 2020-02-25 DIAGNOSIS — R0902 Hypoxemia: Secondary | ICD-10-CM | POA: Diagnosis not present

## 2020-02-25 DIAGNOSIS — L89322 Pressure ulcer of left buttock, stage 2: Secondary | ICD-10-CM | POA: Diagnosis present

## 2020-02-25 DIAGNOSIS — A049 Bacterial intestinal infection, unspecified: Secondary | ICD-10-CM | POA: Diagnosis not present

## 2020-02-25 DIAGNOSIS — I2510 Atherosclerotic heart disease of native coronary artery without angina pectoris: Secondary | ICD-10-CM | POA: Diagnosis present

## 2020-02-25 DIAGNOSIS — R1314 Dysphagia, pharyngoesophageal phase: Secondary | ICD-10-CM | POA: Diagnosis not present

## 2020-02-25 DIAGNOSIS — E119 Type 2 diabetes mellitus without complications: Secondary | ICD-10-CM | POA: Diagnosis present

## 2020-02-25 DIAGNOSIS — M6281 Muscle weakness (generalized): Secondary | ICD-10-CM | POA: Diagnosis not present

## 2020-02-25 DIAGNOSIS — R109 Unspecified abdominal pain: Secondary | ICD-10-CM | POA: Diagnosis not present

## 2020-02-25 DIAGNOSIS — A09 Infectious gastroenteritis and colitis, unspecified: Secondary | ICD-10-CM | POA: Diagnosis not present

## 2020-02-25 DIAGNOSIS — E876 Hypokalemia: Secondary | ICD-10-CM | POA: Diagnosis not present

## 2020-03-01 DIAGNOSIS — I10 Essential (primary) hypertension: Secondary | ICD-10-CM | POA: Diagnosis not present

## 2020-03-01 DIAGNOSIS — I110 Hypertensive heart disease with heart failure: Secondary | ICD-10-CM | POA: Diagnosis not present

## 2020-03-01 DIAGNOSIS — E119 Type 2 diabetes mellitus without complications: Secondary | ICD-10-CM | POA: Diagnosis not present

## 2020-03-01 DIAGNOSIS — A09 Infectious gastroenteritis and colitis, unspecified: Secondary | ICD-10-CM | POA: Diagnosis not present

## 2020-03-01 DIAGNOSIS — N179 Acute kidney failure, unspecified: Secondary | ICD-10-CM | POA: Diagnosis not present

## 2020-03-01 DIAGNOSIS — R0902 Hypoxemia: Secondary | ICD-10-CM | POA: Diagnosis not present

## 2020-03-01 DIAGNOSIS — N2589 Other disorders resulting from impaired renal tubular function: Secondary | ICD-10-CM | POA: Diagnosis not present

## 2020-03-01 DIAGNOSIS — I2510 Atherosclerotic heart disease of native coronary artery without angina pectoris: Secondary | ICD-10-CM | POA: Diagnosis not present

## 2020-03-01 DIAGNOSIS — R1314 Dysphagia, pharyngoesophageal phase: Secondary | ICD-10-CM | POA: Diagnosis not present

## 2020-03-01 DIAGNOSIS — A048 Other specified bacterial intestinal infections: Secondary | ICD-10-CM | POA: Diagnosis not present

## 2020-03-01 DIAGNOSIS — K529 Noninfective gastroenteritis and colitis, unspecified: Secondary | ICD-10-CM | POA: Diagnosis not present

## 2020-03-01 DIAGNOSIS — M6281 Muscle weakness (generalized): Secondary | ICD-10-CM | POA: Diagnosis not present

## 2020-03-01 DIAGNOSIS — K922 Gastrointestinal hemorrhage, unspecified: Secondary | ICD-10-CM | POA: Diagnosis not present

## 2020-03-01 DIAGNOSIS — I5031 Acute diastolic (congestive) heart failure: Secondary | ICD-10-CM | POA: Diagnosis not present

## 2020-03-01 DIAGNOSIS — R279 Unspecified lack of coordination: Secondary | ICD-10-CM | POA: Diagnosis not present

## 2020-03-01 DIAGNOSIS — R2689 Other abnormalities of gait and mobility: Secondary | ICD-10-CM | POA: Diagnosis not present

## 2020-03-01 DIAGNOSIS — R1084 Generalized abdominal pain: Secondary | ICD-10-CM | POA: Diagnosis not present

## 2020-03-01 DIAGNOSIS — I5022 Chronic systolic (congestive) heart failure: Secondary | ICD-10-CM | POA: Diagnosis not present

## 2020-03-01 DIAGNOSIS — R41841 Cognitive communication deficit: Secondary | ICD-10-CM | POA: Diagnosis not present

## 2020-03-01 DIAGNOSIS — Z743 Need for continuous supervision: Secondary | ICD-10-CM | POA: Diagnosis not present

## 2020-03-01 DIAGNOSIS — R404 Transient alteration of awareness: Secondary | ICD-10-CM | POA: Diagnosis not present

## 2020-03-01 DIAGNOSIS — E1151 Type 2 diabetes mellitus with diabetic peripheral angiopathy without gangrene: Secondary | ICD-10-CM | POA: Diagnosis not present

## 2020-03-04 DIAGNOSIS — I5022 Chronic systolic (congestive) heart failure: Secondary | ICD-10-CM | POA: Diagnosis not present

## 2020-03-04 DIAGNOSIS — A048 Other specified bacterial intestinal infections: Secondary | ICD-10-CM | POA: Diagnosis not present

## 2020-03-04 DIAGNOSIS — N179 Acute kidney failure, unspecified: Secondary | ICD-10-CM | POA: Diagnosis not present

## 2020-03-04 DIAGNOSIS — N2589 Other disorders resulting from impaired renal tubular function: Secondary | ICD-10-CM | POA: Diagnosis not present

## 2020-03-04 DIAGNOSIS — E119 Type 2 diabetes mellitus without complications: Secondary | ICD-10-CM | POA: Diagnosis not present

## 2020-03-04 DIAGNOSIS — I10 Essential (primary) hypertension: Secondary | ICD-10-CM | POA: Diagnosis not present

## 2020-04-05 DIAGNOSIS — I10 Essential (primary) hypertension: Secondary | ICD-10-CM | POA: Diagnosis not present

## 2020-04-05 DIAGNOSIS — I5033 Acute on chronic diastolic (congestive) heart failure: Secondary | ICD-10-CM | POA: Diagnosis not present

## 2020-04-05 DIAGNOSIS — E1121 Type 2 diabetes mellitus with diabetic nephropathy: Secondary | ICD-10-CM | POA: Diagnosis not present

## 2020-04-19 DIAGNOSIS — Z23 Encounter for immunization: Secondary | ICD-10-CM | POA: Diagnosis not present

## 2020-05-06 DIAGNOSIS — I5033 Acute on chronic diastolic (congestive) heart failure: Secondary | ICD-10-CM | POA: Diagnosis not present

## 2020-05-06 DIAGNOSIS — I10 Essential (primary) hypertension: Secondary | ICD-10-CM | POA: Diagnosis not present

## 2020-05-06 DIAGNOSIS — E1121 Type 2 diabetes mellitus with diabetic nephropathy: Secondary | ICD-10-CM | POA: Diagnosis not present

## 2020-05-14 DIAGNOSIS — E1165 Type 2 diabetes mellitus with hyperglycemia: Secondary | ICD-10-CM | POA: Diagnosis not present

## 2020-05-14 DIAGNOSIS — Z66 Do not resuscitate: Secondary | ICD-10-CM | POA: Diagnosis not present

## 2020-05-14 DIAGNOSIS — I110 Hypertensive heart disease with heart failure: Secondary | ICD-10-CM | POA: Diagnosis not present

## 2020-05-14 DIAGNOSIS — E119 Type 2 diabetes mellitus without complications: Secondary | ICD-10-CM | POA: Diagnosis not present

## 2020-05-14 DIAGNOSIS — R4020 Unspecified coma: Secondary | ICD-10-CM | POA: Diagnosis not present

## 2020-05-14 DIAGNOSIS — I5032 Chronic diastolic (congestive) heart failure: Secondary | ICD-10-CM | POA: Diagnosis not present

## 2020-05-14 DIAGNOSIS — R069 Unspecified abnormalities of breathing: Secondary | ICD-10-CM | POA: Diagnosis not present

## 2020-05-14 DIAGNOSIS — R404 Transient alteration of awareness: Secondary | ICD-10-CM | POA: Diagnosis not present

## 2020-05-14 DIAGNOSIS — R0600 Dyspnea, unspecified: Secondary | ICD-10-CM | POA: Diagnosis not present

## 2020-05-14 DIAGNOSIS — J9600 Acute respiratory failure, unspecified whether with hypoxia or hypercapnia: Secondary | ICD-10-CM | POA: Diagnosis not present

## 2020-05-28 DEATH — deceased

## 2020-08-20 ENCOUNTER — Encounter (INDEPENDENT_AMBULATORY_CARE_PROVIDER_SITE_OTHER): Payer: Self-pay

## 2020-08-20 ENCOUNTER — Encounter (INDEPENDENT_AMBULATORY_CARE_PROVIDER_SITE_OTHER): Payer: Medicare Other | Admitting: Ophthalmology
